# Patient Record
Sex: Male | Born: 1955 | Race: Black or African American | Hispanic: No | Marital: Married | Smoking: Never smoker
Health system: Southern US, Community
[De-identification: ages and names within clinical notes are randomized; demographics above are authoritative.]

## PROBLEM LIST (undated history)

## (undated) DIAGNOSIS — T884XXA Failed or difficult intubation, initial encounter: Secondary | ICD-10-CM

## (undated) DIAGNOSIS — E785 Hyperlipidemia, unspecified: Secondary | ICD-10-CM

## (undated) DIAGNOSIS — I2699 Other pulmonary embolism without acute cor pulmonale: Secondary | ICD-10-CM

## (undated) DIAGNOSIS — I6783 Posterior reversible encephalopathy syndrome: Secondary | ICD-10-CM

## (undated) DIAGNOSIS — E119 Type 2 diabetes mellitus without complications: Secondary | ICD-10-CM

## (undated) DIAGNOSIS — N289 Disorder of kidney and ureter, unspecified: Secondary | ICD-10-CM

## (undated) HISTORY — DX: Other pulmonary embolism without acute cor pulmonale: I26.99

## (undated) HISTORY — DX: Posterior reversible encephalopathy syndrome: I67.83

## (undated) HISTORY — PX: PROSTATECTOMY: SHX69

---

## 2007-01-31 DIAGNOSIS — E119 Type 2 diabetes mellitus without complications: Secondary | ICD-10-CM

## 2007-01-31 HISTORY — DX: Type 2 diabetes mellitus without complications: E11.9

## 2012-08-30 DIAGNOSIS — I6783 Posterior reversible encephalopathy syndrome: Secondary | ICD-10-CM | POA: Insufficient documentation

## 2012-09-05 ENCOUNTER — Emergency Department (HOSPITAL_BASED_OUTPATIENT_CLINIC_OR_DEPARTMENT_OTHER): Payer: BC Managed Care – PPO

## 2012-09-05 ENCOUNTER — Encounter (HOSPITAL_BASED_OUTPATIENT_CLINIC_OR_DEPARTMENT_OTHER): Payer: Self-pay | Admitting: *Deleted

## 2012-09-05 ENCOUNTER — Observation Stay (HOSPITAL_BASED_OUTPATIENT_CLINIC_OR_DEPARTMENT_OTHER)
Admission: EM | Admit: 2012-09-05 | Discharge: 2012-09-07 | Disposition: A | Discharge status: Home / Self Care | Payer: BC Managed Care – PPO | Attending: Internal Medicine | Admitting: Internal Medicine

## 2012-09-05 DIAGNOSIS — R9409 Abnormal results of other function studies of central nervous system: Secondary | ICD-10-CM | POA: Insufficient documentation

## 2012-09-05 DIAGNOSIS — R55 Syncope and collapse: Secondary | ICD-10-CM

## 2012-09-05 DIAGNOSIS — I10 Essential (primary) hypertension: Secondary | ICD-10-CM | POA: Insufficient documentation

## 2012-09-05 DIAGNOSIS — E119 Type 2 diabetes mellitus without complications: Secondary | ICD-10-CM | POA: Insufficient documentation

## 2012-09-05 DIAGNOSIS — E785 Hyperlipidemia, unspecified: Secondary | ICD-10-CM | POA: Insufficient documentation

## 2012-09-05 DIAGNOSIS — R9089 Other abnormal findings on diagnostic imaging of central nervous system: Secondary | ICD-10-CM | POA: Diagnosis present

## 2012-09-05 DIAGNOSIS — E1129 Type 2 diabetes mellitus with other diabetic kidney complication: Secondary | ICD-10-CM | POA: Diagnosis present

## 2012-09-05 DIAGNOSIS — R569 Unspecified convulsions: Principal | ICD-10-CM | POA: Insufficient documentation

## 2012-09-05 HISTORY — DX: Type 2 diabetes mellitus without complications: E11.9

## 2012-09-05 HISTORY — DX: Hyperlipidemia, unspecified: E78.5

## 2012-09-05 HISTORY — DX: Disorder of kidney and ureter, unspecified: N28.9

## 2012-09-05 LAB — COMPREHENSIVE METABOLIC PANEL
ALT: 42 U/L (ref 0–53)
AST: 34 U/L (ref 0–37)
CO2: 11 mEq/L — ABNORMAL LOW (ref 19–32)
Calcium: 9.7 mg/dL (ref 8.4–10.5)
Chloride: 97 mEq/L (ref 96–112)
GFR calc non Af Amer: 73 mL/min — ABNORMAL LOW (ref >90)
Potassium: 4.6 mEq/L (ref 3.5–5.1)
Sodium: 136 mEq/L (ref 135–145)
Total Bilirubin: 0.2 mg/dL — ABNORMAL LOW (ref 0.3–1.2)

## 2012-09-05 LAB — RAPID URINE DRUG SCREEN, HOSP PERFORMED
Amphetamines: NOT DETECTED
Opiates: NOT DETECTED
Tetrahydrocannabinol: NOT DETECTED

## 2012-09-05 LAB — URINALYSIS, ROUTINE W REFLEX MICROSCOPIC
Glucose, UA: >1000 mg/dL — AB
Urobilinogen, UA: 0.2 mg/dL (ref 0.0–1.0)

## 2012-09-05 LAB — URINE MICROSCOPIC-ADD ON

## 2012-09-05 LAB — CBC
Platelets: 221 10*3/uL (ref 150–400)
RBC: 5.08 MIL/uL (ref 4.22–5.81)
WBC: 20.4 10*3/uL — ABNORMAL HIGH (ref 4.0–10.5)

## 2012-09-05 MED ORDER — SODIUM CHLORIDE 0.9 % IV BOLUS (SEPSIS)
1000.0000 mL | Freq: Once | INTRAVENOUS | Status: AC
  Administered 2012-09-05: 1000 mL via INTRAVENOUS

## 2012-09-05 MED ORDER — SODIUM CHLORIDE 0.9 % IV SOLN
INTRAVENOUS | Status: AC
  Administered 2012-09-06: 04:00:00 via INTRAVENOUS

## 2012-09-05 NOTE — ED Notes (Signed)
Pt found on floor by co-workers. 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 Pt exited the clean room and co-workers told EMS that he appeared dizzy. A few minutes later co-workers found him unresponsive on the floor with a bloody nose. EMS reports responsive on arrival but appeared postictal.

## 2012-09-05 NOTE — ED Provider Notes (Signed)
CSN: [id number]     Arrival date & time 09/05/12  2103 History     First MD Initiated Contact with Patient 09/05/12 2124     Chief Complaint  Patient presents with  . Loss of Consciousness   (Consider location/radiation/quality/duration/timing/severity/associated sxs/prior Treatment) HPI Pt found down at work. Unwitnessed fall. Pt is amnestic to the event. Pt is not complaining of HA, visual changes, numbness or focal weakness. No neck pain, CP, SOB, abd pain, N/V/D. Pt with no history of seizure.  Past Medical History  Diagnosis Date  . Diabetes mellitus without complication   . Renal disorder   . Hyperlipidemia    History reviewed. No pertinent past surgical history. No family history on file. History  Substance Use Topics  . Smoking status: Never Smoker   . Smokeless tobacco: Not on file  . Alcohol Use: No    Review of Systems  Constitutional: Negative for fever and chills.  HENT: Positive for facial swelling. Negative for neck pain.   Respiratory: Negative for cough and shortness of breath.   Cardiovascular: Negative for chest pain.  Gastrointestinal: Negative for nausea, vomiting and abdominal pain.  Musculoskeletal: Negative for myalgias and back pain.  Skin: Positive for wound. Negative for rash.  Neurological: Positive for syncope. Negative for dizziness, weakness, numbness and headaches.  All other systems reviewed and are negative.    Allergies  Review of patient's allergies indicates no known allergies.  Home Medications   Current Outpatient Rx  Name  Route  Sig  Dispense  Refill  . lisinopril (PRINIVIL,ZESTRIL) 10 MG tablet   Oral   Take by mouth daily. Pt unable to remember dose         . rosuvastatin (CRESTOR) 10 MG tablet   Oral   Take 10 mg by mouth daily.         . sitaGLIPtin (JANUVIA) 25 MG tablet   Oral   Take by mouth daily. Pt unable to remember dose          BP 139/80  Pulse 115  Temp(Src) 98.3 F (36.8 C) (Oral)  Resp 24   SpO2 96% Physical Exam  Nursing note and vitals reviewed. Constitutional: He is oriented to person, place, and time. He appears well-developed and well-nourished. No distress.  HENT:  Head: Normocephalic.  Mouth/Throat: Oropharynx is clear and moist.  Nasal swelling with dried blood in both nares  Eyes: EOM are normal. Pupils are equal, round, and reactive to light.  Neck: Normal range of motion. Neck supple.  No posterior cervical TTP  Cardiovascular: Regular rhythm.   tachycardia  Pulmonary/Chest: Effort normal and breath sounds normal. No respiratory distress. He has no wheezes. He has no rales.  Abdominal: Soft. Bowel sounds are normal. He exhibits no distension and no mass. There is no tenderness. There is no rebound and no guarding.  Musculoskeletal: Normal range of motion. He exhibits no edema and no tenderness.  Neurological: He is alert and oriented to person, place, and time.  Pt following commands and answers questions though hesitant. Pt is amnestic to events earlier in the day. 5/5 motor in all ext, sensation intact throughout. Bl finger to nose intact, speech clear, normal visual fields, CN II-XII intact.    Skin: Skin is warm and dry. No rash noted. No erythema.  Psychiatric: He has a normal mood and affect. His behavior is normal.    ED Course   Procedures (including critical care time)  Labs Reviewed  CBC - Abnormal; Notable for  the following:    WBC 20.4 (*)    All other components within normal limits  COMPREHENSIVE METABOLIC PANEL - Abnormal; Notable for the following:    CO2 11 (*)    Glucose, Bld 317 (*)    Total Bilirubin 0.2 (*)    GFR calc non Af Amer 73 (*)    GFR calc Af Amer 84 (*)    All other components within normal limits  URINALYSIS, ROUTINE W REFLEX MICROSCOPIC - Abnormal; Notable for the following:    Glucose, UA >1000 (*)    Hgb urine dipstick TRACE (*)    Ketones, ur 15 (*)    Protein, ur 100 (*)    All other components within normal  limits  TROPONIN I  URINE RAPID DRUG SCREEN (HOSP PERFORMED)  CK  URINE MICROSCOPIC-ADD ON   Ct Head Wo Contrast  09/05/2012   *RADIOLOGY REPORT*  Clinical Data: Dizziness.  Syncope.  CT HEAD WITHOUT CONTRAST  Technique:  Contiguous axial images were obtained from the base of the skull through the vertex without contrast.  Comparison: No priors.  Findings: In the right occipital region there is an area of slightly ill-defined low attenuation region in the cortex and subcortical white matter on images 12-14, suspicious for acute/subacute ischemia.  No evidence of acute post-traumatic intracranial hemorrhage.  No focal mass, mass effect, hydrocephalus or abnormal intra or extra-axial fluid collections.  No acute displaced skull fractures are identified.  Mastoids are well pneumatized bilaterally.  Visualized paranasal sinuses are generally well pneumatized, with exception of multifocal mucosal thickening throughout the posterior right maxillary and bilateral sphenoid sinuses.  IMPRESSION: 1.  Area of potential acute/subacute ischemia in the right occipital lobe.  Further evaluation with brain MRI with and without gadolinium is recommended to better characterize this finding. 2.  No signs of significant acute traumatic injury to the head or brain. 3.  Paranasal sinus disease, as above.  These results were called by telephone on 09/05/2012 at 10:04 p.m. to Dr. Ranae Palms, who verbally acknowledged these results.   Original Report Authenticated By: Trudie Reed, M.D.   Dg Chest Port 1 View  09/05/2012   *RADIOLOGY REPORT*  Clinical Data: Syncope and dizziness.  PORTABLE CHEST - 1 VIEW  Comparison: No priors.  Findings: Film is severely under penetrated.  With these limitations in mind lung volumes are normal.  No consolidative airspace disease.  No pleural effusions.  No pneumothorax.  No pulmonary nodule or mass noted.  Pulmonary vasculature and the cardiomediastinal silhouette are within normal limits.   IMPRESSION: 1. No radiographic evidence of acute cardiopulmonary disease.   Original Report Authenticated By: Trudie Reed, M.D.   1. Seizure     Date: 09/05/2012  Rate: 114  Rhythm: sinus tachycardia  QRS Axis: normal  Intervals: normal  ST/T Wave abnormalities: normal  Conduction Disutrbances:none  Narrative Interpretation:   Old EKG Reviewed: none available   MDM  Discussed with Dr Roseanne Reno at 2210. Stated given probable seizure and no current deficits pt is not tPA candidate. Will see when admitted.  Pt is now more alert and immediately responsive. Continues to have no deficits. Agrees to be transferred to [facility].   Discussed with Dr Conley Rolls who accepted pt in transfer to tele floor. Pt remains neurologically stable.   Loren Racer, MD 09/05/12 2329

## 2012-09-05 NOTE — ED Notes (Signed)
Called Care link for Neuro per Dr. Ranae Palms, spoke with Bjorn Loser

## 2012-09-05 NOTE — Progress Notes (Signed)
PENDING ACCEPTANCE TRANFER NOTE:  Call received from:  Dr Ranae Palms of [facility].  REASON FOR REQUESTING TRANSFER: CVA and seizure work up.  HPI: Patient was found with possible Sz, ?post ictal.  CT showed has some density which could be a cva.  Neuro consulted, recommended CVA and Sz work up.   PLAN:  According to telephone report, this patient was accepted for transfer to Telemetry [facility].  I have requested an order be written to call Flow Manager at [phone] upon patient arrival to the floor for final physician assignment who will do the admission and give admitting orders.  SIGNEDHouston Siren, MD [facility]  09/05/2012, 11:27 PM

## 2012-09-06 ENCOUNTER — Inpatient Hospital Stay (HOSPITAL_COMMUNITY)
Admit: 2012-09-06 | Discharge: 2012-09-06 | Disposition: A | Discharge status: Home / Self Care | Payer: BC Managed Care – PPO | Attending: Internal Medicine | Admitting: Internal Medicine

## 2012-09-06 ENCOUNTER — Observation Stay (HOSPITAL_COMMUNITY): Payer: BC Managed Care – PPO

## 2012-09-06 DIAGNOSIS — R9089 Other abnormal findings on diagnostic imaging of central nervous system: Secondary | ICD-10-CM | POA: Diagnosis present

## 2012-09-06 DIAGNOSIS — R55 Syncope and collapse: Secondary | ICD-10-CM

## 2012-09-06 DIAGNOSIS — R930 Abnormal findings on diagnostic imaging of skull and head, not elsewhere classified: Secondary | ICD-10-CM

## 2012-09-06 DIAGNOSIS — R569 Unspecified convulsions: Secondary | ICD-10-CM | POA: Diagnosis present

## 2012-09-06 DIAGNOSIS — E119 Type 2 diabetes mellitus without complications: Secondary | ICD-10-CM

## 2012-09-06 DIAGNOSIS — I10 Essential (primary) hypertension: Secondary | ICD-10-CM | POA: Diagnosis present

## 2012-09-06 DIAGNOSIS — E1129 Type 2 diabetes mellitus with other diabetic kidney complication: Secondary | ICD-10-CM | POA: Diagnosis present

## 2012-09-06 LAB — HEMOGLOBIN A1C: Hgb A1c MFr Bld: 7.1 % — ABNORMAL HIGH (ref <5.7)

## 2012-09-06 LAB — GLUCOSE, CAPILLARY
Glucose-Capillary: 151 mg/dL — ABNORMAL HIGH (ref 70–99)
Glucose-Capillary: 113 mg/dL — ABNORMAL HIGH (ref 70–99)
Glucose-Capillary: 212 mg/dL — ABNORMAL HIGH (ref 70–99)

## 2012-09-06 LAB — BASIC METABOLIC PANEL
CO2: 19 mEq/L (ref 19–32)
Calcium: 9.2 mg/dL (ref 8.4–10.5)
Potassium: 4.3 mEq/L (ref 3.5–5.1)
Sodium: 139 mEq/L (ref 135–145)

## 2012-09-06 LAB — TROPONIN I: Troponin I: <0.3 ng/mL (ref <0.30)

## 2012-09-06 LAB — CBC WITH DIFFERENTIAL/PLATELET
Basophils Absolute: 0 10*3/uL (ref 0.0–0.1)
Eosinophils Relative: 0 % (ref 0–5)
Lymphocytes Relative: 21 % (ref 12–46)
Neutro Abs: 7 10*3/uL (ref 1.7–7.7)
Neutrophils Relative %: 67 % (ref 43–77)
Platelets: 175 10*3/uL (ref 150–400)
RDW: 12.8 % (ref 11.5–15.5)
WBC: 10.3 10*3/uL (ref 4.0–10.5)

## 2012-09-06 MED ORDER — GADOBENATE DIMEGLUMINE 529 MG/ML IV SOLN
20.0000 mL | Freq: Once | INTRAVENOUS | Status: AC
  Administered 2012-09-06: 20 mL via INTRAVENOUS

## 2012-09-06 MED ORDER — INSULIN ASPART 100 UNIT/ML ~~LOC~~ SOLN
0.0000 [IU] | Freq: Three times a day (TID) | SUBCUTANEOUS | Status: DC
  Administered 2012-09-06: 2 [IU] via SUBCUTANEOUS
  Administered 2012-09-06: 1 [IU] via SUBCUTANEOUS
  Administered 2012-09-07 (×3): 2 [IU] via SUBCUTANEOUS

## 2012-09-06 MED ORDER — ASPIRIN EC 325 MG PO TBEC
325.0000 mg | DELAYED_RELEASE_TABLET | Freq: Every day | ORAL | Status: DC
  Administered 2012-09-06 – 2012-09-07 (×2): 325 mg via ORAL
  Filled 2012-09-06 (×2): qty 1

## 2012-09-06 MED ORDER — LISINOPRIL 10 MG PO TABS
10.0000 mg | ORAL_TABLET | Freq: Every day | ORAL | Status: DC
  Administered 2012-09-06 – 2012-09-07 (×2): 10 mg via ORAL
  Filled 2012-09-06 (×2): qty 1

## 2012-09-06 MED ORDER — INSULIN ASPART 100 UNIT/ML ~~LOC~~ SOLN: 0.0000 [IU] | SUBCUTANEOUS | Status: DC

## 2012-09-06 MED ORDER — ENOXAPARIN SODIUM 40 MG/0.4ML ~~LOC~~ SOLN
40.0000 mg | SUBCUTANEOUS | Status: DC
  Administered 2012-09-06 – 2012-09-07 (×2): 40 mg via SUBCUTANEOUS
  Filled 2012-09-06 (×2): qty 0.4

## 2012-09-06 MED ORDER — LINAGLIPTIN 5 MG PO TABS
5.0000 mg | ORAL_TABLET | Freq: Every day | ORAL | Status: DC
  Administered 2012-09-06 – 2012-09-07 (×2): 5 mg via ORAL
  Filled 2012-09-06 (×2): qty 1

## 2012-09-06 MED ORDER — SODIUM CHLORIDE 0.9 % IJ SOLN
3.0000 mL | Freq: Two times a day (BID) | INTRAMUSCULAR | Status: DC
  Administered 2012-09-06 – 2012-09-07 (×4): 3 mL via INTRAVENOUS

## 2012-09-06 MED ORDER — ATORVASTATIN CALCIUM 20 MG PO TABS
20.0000 mg | ORAL_TABLET | Freq: Every day | ORAL | Status: DC
  Administered 2012-09-06 – 2012-09-07 (×2): 20 mg via ORAL
  Filled 2012-09-06 (×2): qty 1

## 2012-09-06 NOTE — Evaluation (Signed)
Physical Therapy Evaluation Patient Details Name: Juan Glenn MRN: [id number] DOB: 12-Aug-1955 Today's Date: 09/06/2012 Time: 4098-1191 PT Time Calculation (min): 11 min  PT Assessment / Plan / Recommendation History of Present Illness  Patient is a 57 yo male admitted with seizures and abnormal head CT.  Area of potential acute/subacute ischemia in the right occipital area per CT report.  Clinical Impression  Patient is independent with all mobility and gait.  Patient scored 24/24 on DGI balance assessment indicating no fall risk.  No acute PT needs identified - PT will sign off.    PT Assessment  Patent does not need any further PT services    Follow Up Recommendations  No PT follow up    Does the patient have the potential to tolerate intense rehabilitation      Barriers to Discharge        Equipment Recommendations  None recommended by PT    Recommendations for Other Services     Frequency      Precautions / Restrictions Precautions Precautions: None Restrictions Weight Bearing Restrictions: No   Pertinent Vitals/Pain       Mobility  Bed Mobility Bed Mobility: Supine to Sit;Sit to Supine Supine to Sit: 7: Independent Sit to Supine: 7: Independent Transfers Transfers: Sit to Stand;Stand to Sit Sit to Stand: 7: Independent;From bed Stand to Sit: 7: Independent;To bed Ambulation/Gait Ambulation/Gait Assistance: 7: Independent Ambulation Distance (Feet): 220 Feet Assistive device: None Ambulation/Gait Assistance Details: Patient with good gait pattern, balance, and gait speed.  No loss in balance noted with high level balance challenges. Gait Pattern: Within Functional Limits Gait velocity: WFL Stairs: Yes Stairs Assistance: 7: Independent Stair Management Technique: No rails;Alternating pattern;Forwards Number of Stairs: 5 Modified Rankin (Stroke Patients Only) Pre-Morbid Rankin Score: No symptoms Modified Rankin: No symptoms        PT  Goals(Current goals can be found in the care plan section)  N/A  Visit Information  Last PT Received On: 09/06/12 Assistance Needed: +1 History of Present Illness: Patient is a 57 yo male admitted with seizures and abnormal head CT.  Area of potential acute/subacute ischemia in the right occipital area per CT report.       Prior Functioning  Home Living Family/patient expects to be discharged to:: Private residence Living Arrangements: Spouse/significant other Available Help at Discharge: Family;Available 24 hours/day Type of Home: House Home Access: Stairs to enter [employer] of Steps: 3 Entrance Stairs-Rails: Right Home Layout: One level Home Equipment: None Prior Function Level of Independence: Independent Communication Communication: No difficulties Dominant Hand: Right    Cognition  Cognition Arousal/Alertness: Awake/alert Behavior During Therapy: WFL for tasks assessed/performed Overall Cognitive Status: Within Functional Limits for tasks assessed    Extremity/Trunk Assessment Upper Extremity Assessment Upper Extremity Assessment: Overall WFL for tasks assessed Lower Extremity Assessment Lower Extremity Assessment: Overall WFL for tasks assessed (Strength symmetrical with no sensory loss noted.) Cervical / Trunk Assessment Cervical / Trunk Assessment: Normal   Balance Balance Balance Assessed: Yes Standardized Balance Assessment Standardized Balance Assessment: Dynamic Gait Index Dynamic Gait Index Level Surface: Normal Change in Gait Speed: Normal Gait with Horizontal Head Turns: Normal Gait with Vertical Head Turns: Normal Gait and Pivot Turn: Normal Step Over Obstacle: Normal Step Around Obstacles: Normal Steps: Normal Total Score: 24  End of Session PT - End of Session Activity Tolerance: Patient tolerated treatment well Patient left: in bed;with family/visitor present Nurse Communication: Mobility status  GP Functional Assessment Tool  Used: DGI balance assessment and  clinical judgement Functional Limitation: Mobility: Walking and moving around Mobility: Walking and Moving Around Current Status [phone]): 0 percent impaired, limited or restricted Mobility: Walking and Moving Around Goal Status [phone]): 0 percent impaired, limited or restricted Mobility: Walking and Moving Around Discharge Status [phone]): 0 percent impaired, limited or restricted   Vena Austria 09/06/2012, 2:33 PM Durenda Hurt. Renaldo Fiddler, [facility] Pager [phone]

## 2012-09-06 NOTE — Progress Notes (Signed)
[facility] PROGRESS NOTE  Juan Glenn VHQ:[id number] DOB: 1955/06/21 DOA: 09/05/2012 PCP: No primary provider on file.  Brief history 57 year old male with a history of hypertension, diabetes mellitus, hyperlipidemia was found unconscious in the bathroom of his workplace. The patient stated that earlier that evening at work he had an episode of transient blurred vision with associated nausea. As result he went to the bathroom because he felt like he wanted to vomit. There was a less than he remembered. The patient was found by a coworker and EMS was activated. The patient apparently was confused when he woke up. The patient denied any recent fevers, chills, chest discomfort, shortness of breath, nausea, vomiting, blurred vision, focal extremity weakness, dysuria, hematuria, dysarthria this past week. He had been in his usual state of health prior to this episode. In the ED, CT revealed abnormal ill-defined low attenuation area in the right occipital lobe. The patient was also found to have an abrasion on his tongue. Assessment/Plan: Syncope -Secondary to generalized seizure -MRI brain -EEG -Further testing pending results of MRI and EEG -Appreciate neurology consultation -Continue aspirin 325 mg -repeat troponin Abnormal CT brain -Concerned about stroke as the cause for the patient's general a seizure -MRI brain as discussed Leukocytosis -Likely reactive due to the patient's acute medical condition -Continue to trend -Urinalysis did not show pyuria -Urine drug screen is negative Diabetes mellitus type 2 -NovoLog sliding scale -Check hemoglobin A1c Hyperlipidemia -Continue statin Hypertension -Continue lisinopril Family Communication:   wife at beside Disposition Plan:   Home when medically stable      Procedures/Studies: Ct Head Wo Contrast  09/05/2012   *RADIOLOGY REPORT*  Clinical Data: Dizziness.  Syncope.  CT HEAD WITHOUT CONTRAST  Technique:  Contiguous  axial images were obtained from the base of the skull through the vertex without contrast.  Comparison: No priors.  Findings: In the right occipital region there is an area of slightly ill-defined low attenuation region in the cortex and subcortical white matter on images 12-14, suspicious for acute/subacute ischemia.  No evidence of acute post-traumatic intracranial hemorrhage.  No focal mass, mass effect, hydrocephalus or abnormal intra or extra-axial fluid collections.  No acute displaced skull fractures are identified.  Mastoids are well pneumatized bilaterally.  Visualized paranasal sinuses are generally well pneumatized, with exception of multifocal mucosal thickening throughout the posterior right maxillary and bilateral sphenoid sinuses.  IMPRESSION: 1.  Area of potential acute/subacute ischemia in the right occipital lobe.  Further evaluation with brain MRI with and without gadolinium is recommended to better characterize this finding. 2.  No signs of significant acute traumatic injury to the head or brain. 3.  Paranasal sinus disease, as above.  These results were called by telephone on 09/05/2012 at 10:04 p.m. to Dr. Ranae Palms, who verbally acknowledged these results.   Original Report Authenticated By: Trudie Reed, M.D.   Dg Chest Port 1 View  09/05/2012   *RADIOLOGY REPORT*  Clinical Data: Syncope and dizziness.  PORTABLE CHEST - 1 VIEW  Comparison: No priors.  Findings: Film is severely under penetrated.  With these limitations in mind lung volumes are normal.  No consolidative airspace disease.  No pleural effusions.  No pneumothorax.  No pulmonary nodule or mass noted.  Pulmonary vasculature and the cardiomediastinal silhouette are within normal limits.  IMPRESSION: 1. No radiographic evidence of acute cardiopulmonary disease.   Original Report Authenticated By: Trudie Reed, M.D.         Subjective: Patient denies any fevers, chills, chest discomfort, shortness  of breath, nausea,  vomiting, diarrhea, vomiting, headache, focal extremity weakness, dysarthria, visual disturbance.  Objective: Filed Vitals:   09/05/12 2108 09/05/12 2344 09/06/12 0152 09/06/12 0538  BP: 139/80 137/82 134/81 124/78  Pulse: 115 97 93 79  Temp: 98.3 F (36.8 C)  98.1 F (36.7 C) 98.1 F (36.7 C)  TempSrc: Oral  Oral Oral  Resp: 24 16 18 18   Height:   5\' 9"  (1.753 m)   Weight:   97.342 kg (214 lb 9.6 oz)   SpO2: 96% 97% 96% 98%    Intake/Output Summary (Last 24 hours) at 09/06/12 0814 Last data filed at 09/06/12 0400  Gross per 24 hour  Intake    120 ml  Output      0 ml  Net    120 ml   Weight change:  Exam:   General:  Pt is alert, follows commands appropriately, not in acute distress  HEENT: No icterus, No thrush, No neck mass, [place]/AT  Cardiovascular: RRR, S1/S2, no rubs, no gallops  Respiratory: CTA bilaterally, no wheezing, no crackles, no rhonchi  Abdomen: Soft/+BS, non tender, non distended, no guarding  Extremities: No edema, No lymphangitis, No petechiae, No rashes, no synovitis  Data Reviewed: Basic Metabolic Panel:  Recent Labs Lab 09/05/12 2114  NA 136  K 4.6  CL 97  CO2 11*  GLUCOSE 317*  BUN 13  CREATININE 1.10  CALCIUM 9.7   Liver Function Tests:  Recent Labs Lab 09/05/12 2114  AST 34  ALT 42  ALKPHOS 86  BILITOT 0.2*  PROT 7.4  ALBUMIN 4.3   No results found for this basename: LIPASE, AMYLASE,  in the last 168 hours No results found for this basename: AMMONIA,  in the last 168 hours CBC:  Recent Labs Lab 09/05/12 2114  WBC 20.4*  HGB 15.4  HCT 43.8  MCV 86.2  PLT 221   Cardiac Enzymes:  Recent Labs Lab 09/05/12 2114  CKTOTAL 132  TROPONINI <0.30   BNP: No components found with this basename: POCBNP,  CBG:  Recent Labs Lab 09/06/12 0717  GLUCAP 143*    No results found for this or any previous visit (from the past 240 hour(s)).   Scheduled Meds: . sodium chloride   Intravenous STAT  . aspirin EC  325 mg  Oral Daily  . atorvastatin  20 mg Oral q1800  . insulin aspart  0-9 Units Subcutaneous TID WC  . linagliptin  5 mg Oral Daily  . lisinopril  10 mg Oral Daily  . sodium chloride  3 mL Intravenous Q12H   Continuous Infusions:    Jaymi Tinner, DO  [facility] Pager [phone]  If 7PM-7AM, please contact night-coverage [email or web address] Password [email or web address] 09/06/2012, 8:14 AM   LOS: 1 day

## 2012-09-06 NOTE — Progress Notes (Signed)
EEG Completed; Results Pending  

## 2012-09-06 NOTE — H&P (Signed)
[facility] History and Physical  Joedy Eickhoff HYQ:[id number] DOB: 11/24/1955    PCP:   Gentry Fitz.  Chief Complaint: transfer here from [facility] for seizure and abnormal head CT.  HPI: Juan Glenn is an 57 y.o. male wih hx of DM2 on oral hypoglycemic agents, Hyperlipidemia and HTN, with no prior hx of neurological problem, found unconscious in the bathroom without any prodrome.  He apparently bit his tongue and had period of confusion. He did not have any bowel or bladder incontinence.  There has been no focal weakness.  Work up in the ER included a head CT with hypodense area suggestive of acute/subacute CVA in the occipital area, CPK of 132, with normal renal fx test, and BS of 300's.  Neurology was consulted and hospitalist was asked to admit him for seizure, possibly having acute/subacute CVA.  Rewiew of Systems:  Constitutional: Negative for malaise, fever and chills. No significant weight loss or weight gain Eyes: Negative for eye pain, redness and discharge, diplopia, visual changes, or flashes of light. ENMT: Negative for ear pain, hoarseness, nasal congestion, sinus pressure and sore throat. No headaches; tinnitus, drooling, or problem swallowing. Cardiovascular: Negative for chest pain, palpitations, diaphoresis, dyspnea and peripheral edema. ; No orthopnea, PND Respiratory: Negative for cough, hemoptysis, wheezing and stridor. No pleuritic chestpain. Gastrointestinal: Negative for nausea, vomiting, diarrhea, constipation, abdominal pain, melena, blood in stool, hematemesis, jaundice and rectal bleeding.    Genitourinary: Negative for frequency, dysuria, incontinence,flank pain and hematuria; Musculoskeletal: Negative for back pain and neck pain. Negative for swelling and trauma.;  Skin: . Negative for pruritus, rash, abrasions, bruising and skin lesion.; ulcerations Neuro: Negative for headache, lightheadedness and neck stiffness. Negative for weakness, altered  level of consciousness , extremity weakness, burning feet, involuntary movement,   Psych: negative for anxiety, depression, insomnia, tearfulness, panic attacks, hallucinations, paranoia, suicidal or homicidal ideation    Past Medical History  Diagnosis Date  . Diabetes mellitus without complication   . Renal disorder   . Hyperlipidemia     History reviewed. No pertinent past surgical history.  Medications:  HOME MEDS: Prior to Admission medications   Medication Sig Start Date End Date Taking? Authorizing Provider  lisinopril (PRINIVIL,ZESTRIL) 10 MG tablet Take by mouth daily. Pt unable to remember dose   Yes Historical Provider, MD  rosuvastatin (CRESTOR) 10 MG tablet Take 10 mg by mouth daily.   Yes Historical Provider, MD  sitaGLIPtin (JANUVIA) 25 MG tablet Take by mouth daily. Pt unable to remember dose   Yes Historical Provider, MD     Allergies:  No Known Allergies  Social History:   reports that he has never smoked. He does not have any smokeless tobacco history on file. He reports that he does not drink alcohol or use illicit drugs.  Family History: No family history on file.   Physical Exam: Filed Vitals:   09/05/12 2106 09/05/12 2108 09/05/12 2344 09/06/12 0152  BP:  139/80 137/82 134/81  Pulse:  115 97 93  Temp:  98.3 F (36.8 C)  98.1 F (36.7 C)  TempSrc:  Oral  Oral  Resp:  24 16 18   Height:    5\' 9"  (1.753 m)  Weight:    97.342 kg (214 lb 9.6 oz)  SpO2: 98% 96% 97% 96%   Blood pressure 134/81, pulse 93, temperature 98.1 F (36.7 C), temperature source Oral, resp. rate 18, height 5\' 9"  (1.753 m), weight 97.342 kg (214 lb 9.6 oz), SpO2 96.00%.  GEN:  Pleasant  patient  lying in the stretcher in no acute distress; cooperative with exam. PSYCH:  alert and oriented x4; does not appear anxious or depressed; affect is appropriate. HEENT: Mucous membranes pink and anicteric; PERRLA; EOM intact; no cervical lymphadenopathy nor thyromegaly or carotid bruit;  no JVD; There were no stridor. Neck is very supple. Breasts:: Not examined CHEST WALL: No tenderness CHEST: Normal respiration, clear to auscultation bilaterally.  HEART: Regular rate and rhythm.  There are no murmur, rub, or gallops.   BACK: No kyphosis or scoliosis; no CVA tenderness ABDOMEN: soft and non-tender; no masses, no organomegaly, normal abdominal bowel sounds; no pannus; no intertriginous candida. There is no rebound and no distention. Rectal Exam: Not done EXTREMITIES: No bone or joint deformity; age-appropriate arthropathy of the hands and knees; no edema; no ulcerations.  There is no calf tenderness. Genitalia: not examined PULSES: 2+ and symmetric SKIN: Normal hydration no rash or ulceration CNS: Cranial nerves 2-12 grossly intact no focal lateralizing neurologic deficit.  Speech is fluent; uvula elevated with phonation, facial symmetry and tongue midline. DTR are normal bilaterally, cerebella exam is intact, barbinski is negative and strengths are equaled bilaterally.  No sensory loss.   Labs on Admission:  Basic Metabolic Panel:  Recent Labs Lab 09/05/12 2114  NA 136  K 4.6  CL 97  CO2 11*  GLUCOSE 317*  BUN 13  CREATININE 1.10  CALCIUM 9.7   Liver Function Tests:  Recent Labs Lab 09/05/12 2114  AST 34  ALT 42  ALKPHOS 86  BILITOT 0.2*  PROT 7.4  ALBUMIN 4.3   No results found for this basename: LIPASE, AMYLASE,  in the last 168 hours No results found for this basename: AMMONIA,  in the last 168 hours CBC:  Recent Labs Lab 09/05/12 2114  WBC 20.4*  HGB 15.4  HCT 43.8  MCV 86.2  PLT 221   Cardiac Enzymes:  Recent Labs Lab 09/05/12 2114  CKTOTAL 132  TROPONINI <0.30    CBG: No results found for this basename: GLUCAP,  in the last 168 hours   Radiological Exams on Admission: Ct Head Wo Contrast  09/05/2012   *RADIOLOGY REPORT*  Clinical Data: Dizziness.  Syncope.  CT HEAD WITHOUT CONTRAST  Technique:  Contiguous axial images were  obtained from the base of the skull through the vertex without contrast.  Comparison: No priors.  Findings: In the right occipital region there is an area of slightly ill-defined low attenuation region in the cortex and subcortical white matter on images 12-14, suspicious for acute/subacute ischemia.  No evidence of acute post-traumatic intracranial hemorrhage.  No focal mass, mass effect, hydrocephalus or abnormal intra or extra-axial fluid collections.  No acute displaced skull fractures are identified.  Mastoids are well pneumatized bilaterally.  Visualized paranasal sinuses are generally well pneumatized, with exception of multifocal mucosal thickening throughout the posterior right maxillary and bilateral sphenoid sinuses.  IMPRESSION: 1.  Area of potential acute/subacute ischemia in the right occipital lobe.  Further evaluation with brain MRI with and without gadolinium is recommended to better characterize this finding. 2.  No signs of significant acute traumatic injury to the head or brain. 3.  Paranasal sinus disease, as above.  These results were called by telephone on 09/05/2012 at 10:04 p.m. to Dr. Ranae Palms, who verbally acknowledged these results.   Original Report Authenticated By: Trudie Reed, M.D.   Dg Chest Port 1 View  09/05/2012   *RADIOLOGY REPORT*  Clinical Data: Syncope and dizziness.  PORTABLE CHEST - 1 VIEW  Comparison: No priors.  Findings: Film is severely under penetrated.  With these limitations in mind lung volumes are normal.  No consolidative airspace disease.  No pleural effusions.  No pneumothorax.  No pulmonary nodule or mass noted.  Pulmonary vasculature and the cardiomediastinal silhouette are within normal limits.  IMPRESSION: 1. No radiographic evidence of acute cardiopulmonary disease.   Original Report Authenticated By: Trudie Reed, M.D.   Assessment/Plan Present on Admission:  . Unspecified essential hypertension . Type II or unspecified type diabetes mellitus  without mention of complication, not stated as uncontrolled . Convulsions/seizures . Abnormal CT of brain  PLAN:  Suspect a subacute CVA with first generalized TC seizure.  Will get EEG along with follow up MRI for his abnormal brain CT.  For his DM, will continue his meds and use sensitive SSI.  I have continued his home meds.  He is not to operate dangerous machinery or driving for the next 6 months unless cleared by neurology.  He is stable, full code, and will be admitted to [facility] service.  Thank you for allowing me to participate in the care of this nice patient.  Other plans as per orders.  Code Status: FULL Unk Lightning, MD. [facility] Pager [phone] 7pm to 7am.  09/06/2012, 3:16 AM

## 2012-09-06 NOTE — Consult Note (Signed)
Reason for Consult: Generalized seizure, first occurrence; possible acute stroke.  HPI:                                                                                                                                          Juan Glenn is an 57 y.o. male Mr. diabetes mellitus, hypertension and hyperlipidemia who was seen in the emergency room at [facility] and subsequently transferred to [facility] for further management. Patient was found in the bathroom unresponsive at work. He was noted to have an abrasion of his tongue in addition to being confused on arrival in the emergency room. No previous history of stroke nor TIA. Is also no previous history of seizure or syncopal spells. CT scan of his head showed an equivocal area of possible acute ischemia involving the right occipital region. Patient said no visual changes. Blood sugar was 317. Electrolytes were normal.  Past Medical History  Diagnosis Date  . Diabetes mellitus without complication   . Renal disorder   . Hyperlipidemia     History reviewed. No pertinent past surgical history.  No family history on file.  Social History:  reports that he has never smoked. He does not have any smokeless tobacco history on file. He reports that he does not drink alcohol or use illicit drugs.  No Known Allergies  MEDICATIONS:                                                                                                                     I have reviewed the patient's current medications.   ROS:                                                                                                                                       History obtained from the patient  General  ROS: negative for - chills, fatigue, fever, night sweats, weight gain or weight loss Psychological ROS: negative for - behavioral disorder, hallucinations, memory difficulties, mood swings or suicidal ideation Ophthalmic ROS: negative for - blurry vision, double vision, eye pain  or loss of vision ENT ROS: negative for - epistaxis, nasal discharge, oral lesions, sore throat, tinnitus or vertigo Allergy and Immunology ROS: negative for - hives or itchy/watery eyes Hematological and Lymphatic ROS: negative for - bleeding problems, bruising or swollen lymph nodes Endocrine ROS: negative for - galactorrhea, hair pattern changes, polydipsia/polyuria or temperature intolerance Respiratory ROS: negative for - cough, hemoptysis, shortness of breath or wheezing Cardiovascular ROS: negative for - chest pain, dyspnea on exertion, edema or irregular heartbeat Gastrointestinal ROS: negative for - abdominal pain, diarrhea, hematemesis, nausea/vomiting or stool incontinence Genito-Urinary ROS: negative for - dysuria, hematuria, incontinence or urinary frequency/urgency Musculoskeletal ROS: negative for - joint swelling or muscular weakness Neurological ROS: as noted in HPI Dermatological ROS: negative for rash and skin lesion changes   Blood pressure 134/81, pulse 93, temperature 98.1 F (36.7 C), temperature source Oral, resp. rate 18, height 5\' 9"  (1.753 m), weight 97.342 kg (214 lb 9.6 oz), SpO2 96.00%.   Neurologic Examination:                                                                                                      Mental Status: Alert, oriented, thought content appropriate.  Speech fluent without evidence of aphasia. Able to follow commands without difficulty. Cranial Nerves: II-Visual fields were normal. III/IV/VI-Pupils were equal and reacted. Extraocular movements were full and conjugate.    V/VII-no facial numbness and no facial weakness. VIII-normal. X-normal speech and symmetrical palatal movement. XII-midline tongue extension; abrasions with ecchymosis noted at the tip of his tongue. Motor: 5/5 bilaterally with normal tone and bulk Sensory: Normal throughout. Deep Tendon Reflexes: 2+ and symmetric. Plantars: Mute bilaterally Cerebellar: Normal  finger-to-nose testing. Carotid auscultation: Normal    Ct Head Wo Contrast  09/05/2012   *RADIOLOGY REPORT*  Clinical Data: Dizziness.  Syncope.  CT HEAD WITHOUT CONTRAST  Technique:  Contiguous axial images were obtained from the base of the skull through the vertex without contrast.  Comparison: No priors.  Findings: In the right occipital region there is an area of slightly ill-defined low attenuation region in the cortex and subcortical white matter on images 12-14, suspicious for acute/subacute ischemia.  No evidence of acute post-traumatic intracranial hemorrhage.  No focal mass, mass effect, hydrocephalus or abnormal intra or extra-axial fluid collections.  No acute displaced skull fractures are identified.  Mastoids are well pneumatized bilaterally.  Visualized paranasal sinuses are generally well pneumatized, with exception of multifocal mucosal thickening throughout the posterior right maxillary and bilateral sphenoid sinuses.  IMPRESSION: 1.  Area of potential acute/subacute ischemia in the right occipital lobe.  Further evaluation with brain MRI with and without gadolinium is recommended to better characterize this finding. 2.  No signs of significant acute traumatic injury to the head or brain. 3.  Paranasal sinus disease, as above.  These results were called by  telephone on 09/05/2012 at 10:04 p.m. to Dr. Ranae Palms, who verbally acknowledged these results.   Original Report Authenticated By: Trudie Reed, M.D.   Dg Chest Port 1 View  09/05/2012   *RADIOLOGY REPORT*  Clinical Data: Syncope and dizziness.  PORTABLE CHEST - 1 VIEW  Comparison: No priors.  Findings: Film is severely under penetrated.  With these limitations in mind lung volumes are normal.  No consolidative airspace disease.  No pleural effusions.  No pneumothorax.  No pulmonary nodule or mass noted.  Pulmonary vasculature and the cardiomediastinal silhouette are within normal limits.  IMPRESSION: 1. No radiographic evidence of  acute cardiopulmonary disease.   Original Report Authenticated By: Trudie Reed, M.D.   Assessment/Plan: Generalized seizure, first occurrence. Etiology is unclear. Acute stroke we'll need to be ruled out.  Recommendations: 1. MRI of the brain without contrast rule out acute stroke 2. Stroke risk assessment if MRI shows acute stroke 3. EEG routine adult 4. No anticonvulsant medication unless patient has a recurrent generalized seizure, or EEG shows findings indicative of seizure disorder and increased risk for future seizure activity 5. Aspirin 81 mg per day  C.R. Roseanne Reno, MD [facility] [phone]  09/06/2012, 2:21 AM

## 2012-09-06 NOTE — Evaluation (Signed)
Occupational Therapy Evaluation Patient Details Name: Juan Glenn MRN: [id number] DOB: [birth year] Today's Date: 09/06/2012 Time: 4098-1191 OT Time Calculation (min): 15 min  OT Assessment / Plan / Recommendation History of present illness Patient is a 57 yo male admitted with seizures and abnormal head CT.  Area of potential acute/subacute ischemia in the right occipital area per CT report.   Clinical Impression   Patient evaluated by Occupational Therapy with no further acute OT needs identified. All education has been completed and the patient has no further questions. See below for any follow-up Occupational Therapy or equipment needs. OT to sign off. Thank you for referral.      OT Assessment       Follow Up Recommendations  No OT follow up    Barriers to Discharge      Equipment Recommendations  None recommended by OT    Recommendations for Other Services    Frequency       Precautions / Restrictions Precautions Precautions: None Restrictions Weight Bearing Restrictions: No   Pertinent Vitals/Pain No pain Pt and family very anxious to know CT results and awaiting an MRI for more detailed workup    ADL  ADL Comments: Pt is at or near baseline for all adls at independent level. Pt with small visual change noted. Pt has not been educated nor family present on CT scan results. OT was unable to provide detailed explaination for visual changes at this time.  MD please address CT results    OT Diagnosis:    OT Problem List:   OT Treatment Interventions:     OT Goals(Current goals can be found in the care plan section)    Visit Information  Last OT Received On: 09/06/12 Assistance Needed: +1 History of Present Illness: Patient is a 57 yo male admitted with seizures and abnormal head CT.  Area of potential acute/subacute ischemia in the right occipital area per CT report.       Prior Functioning     Home Living Family/patient expects to be discharged  to:: Private residence Living Arrangements: Spouse/significant other Available Help at Discharge: Family;Available 24 hours/day Type of Home: House Home Access: Stairs to enter [employer] of Steps: 3 Entrance Stairs-Rails: Right Home Layout: One level Home Equipment: None Prior Function Level of Independence: Independent Communication Communication: No difficulties Dominant Hand: Right         Vision/Perception Vision - History Baseline Vision: Wears glasses all the time Patient Visual Report:  (changes in left superior visual field) Vision - Assessment Eye Alignment: Within Functional Limits Vision Assessment: Vision tested Ocular Range of Motion: Within Functional Limits Tracking/Visual Pursuits: Able to track stimulus in all quads without difficulty Saccades: Within functional limits Visual Fields:  (left superior visual field) Additional Comments: Pt provided visual cancellation with small errors in top left superior corner. Pt requires scanning for job and encouraged to follow up with MD upon d/c home.    Cognition  Cognition Arousal/Alertness: Awake/alert Behavior During Therapy: WFL for tasks assessed/performed Overall Cognitive Status: Within Functional Limits for tasks assessed    Extremity/Trunk Assessment Upper Extremity Assessment Upper Extremity Assessment: Overall WFL for tasks assessed Lower Extremity Assessment Lower Extremity Assessment: Overall WFL for tasks assessed Cervical / Trunk Assessment Cervical / Trunk Assessment: Normal     Mobility Bed Mobility Bed Mobility: Supine to Sit;Sit to Supine Supine to Sit: 7: Independent Sit to Supine: 7: Independent Transfers Sit to Stand: 7: Independent;From bed Stand to Sit: 7: Independent;To bed  Exercise     Balance Balance Balance Assessed: Yes Standardized Balance Assessment Standardized Balance Assessment: Dynamic Gait Index Dynamic Gait Index Level Surface: Normal Change in  Gait Speed: Normal Gait with Horizontal Head Turns: Normal Gait with Vertical Head Turns: Normal Gait and Pivot Turn: Normal Step Over Obstacle: Normal Step Around Obstacles: Normal Steps: Normal Total Score: 24   End of Session    GO Functional Assessment Tool Used: clinical observation Functional Limitation: Self care Self Care Current Status (Z6109): 0 percent impaired, limited or restricted Self Care Goal Status (U0454): 0 percent impaired, limited or restricted Self Care Discharge Status (U9811): 0 percent impaired, limited or restricted   Lucile Shutters 09/06/2012, 3:31 PM Pager: [phone]

## 2012-09-06 NOTE — Procedures (Signed)
ELECTROENCEPHALOGRAM REPORT   Patient: Juan Glenn       Room #: 6E95 EEG No. ID: [id number] Age: 57 y.o.        Sex: male Referring Physician: Tat Report Date:  09/06/2012        Interpreting Physician: Thana Farr D  History: [name] is an 57 y.o. male with an episode of unresponsiveness  Medications:  Scheduled: . aspirin EC  325 mg Oral Daily  . atorvastatin  20 mg Oral q1800  . enoxaparin (LOVENOX) injection  40 mg Subcutaneous Q24H  . insulin aspart  0-9 Units Subcutaneous TID WC  . linagliptin  5 mg Oral Daily  . lisinopril  10 mg Oral Daily  . sodium chloride  3 mL Intravenous Q12H    Conditions of Recording:  This is a 16 channel EEG carried out with the patient in the awake, drowsy and asleep states.  Description:  The waking background activity consists of a low voltage, symmetrical, fairly well organized, 9 Hz alpha activity, seen from the parieto-occipital and posterior temporal regions.  Low voltage fast activity, poorly organized, is seen anteriorly and is at times superimposed on more posterior regions.  A mixture of theta and alpha rhythms are seen from the central and temporal regions. The patient drowses with slowing to irregular, low voltage theta and beta activity.   The patient goes in to a light sleep with symmetrical sleep spindles, vertex central sharp transients and irregular slow activity.  Hyperventilation was not performed.  Intermittent photic stimulation was performed but failed to illicit any change in the tracing.    IMPRESSION: Normal electroencephalogram, awake, asleep and with activation procedures. There are no focal lateralizing or epileptiform features.   Thana Farr, MD [facility] [phone] 09/06/2012, 6:07 PM

## 2012-09-07 ENCOUNTER — Encounter (HOSPITAL_COMMUNITY): Payer: Self-pay | Admitting: Radiology

## 2012-09-07 ENCOUNTER — Observation Stay (HOSPITAL_COMMUNITY): Payer: BC Managed Care – PPO

## 2012-09-07 DIAGNOSIS — I517 Cardiomegaly: Secondary | ICD-10-CM

## 2012-09-07 LAB — GLUCOSE, CAPILLARY: Glucose-Capillary: 164 mg/dL — ABNORMAL HIGH (ref 70–99)

## 2012-09-07 LAB — LIPID PANEL
HDL: 43 mg/dL (ref >39)
LDL Cholesterol: 45 mg/dL (ref 0–99)
Triglycerides: 110 mg/dL (ref <150)
VLDL: 22 mg/dL (ref 0–40)

## 2012-09-07 MED ORDER — ASPIRIN EC 81 MG PO TBEC: 81.0000 mg | DELAYED_RELEASE_TABLET | Freq: Every day | ORAL | Status: DC

## 2012-09-07 MED ORDER — IOHEXOL 350 MG/ML SOLN
50.0000 mL | Freq: Once | INTRAVENOUS | Status: AC | PRN
  Administered 2012-09-07: 50 mL via INTRAVENOUS

## 2012-09-07 NOTE — Progress Notes (Deleted)
VASCULAR LAB PRELIMINARY  PRELIMINARY  PRELIMINARY  PRELIMINARY  Patient had CT angio of head and neck, showing no ICA stenosis.  Please advise if patient still needs Carotid Dopplers.  Brick Ketcher, RVT 09/07/2012, 3:19 PM

## 2012-09-07 NOTE — Progress Notes (Signed)
NEURO HOSPITALIST PROGRESS NOTE   SUBJECTIVE:                                                                                                                        Offers no neurological complains. EEG normal. MRI brain reviewed:abnormal patchy areas of T2 altered signal intensity involving portions of the temporal lobes,occipital lobes and parietal lobes bilaterally as well as the left frontal lobe. Some of these areas demonstrate mild enhancement/adjacent vascular stasis. These areas do not demonstrate restricted motion as may be seen with acute  infarcts. Minimal petechial hemorrhage associated with the left frontal lobe and right parietal lobe abnormality.   OBJECTIVE:                                                                                                                           Vital signs in last 24 hours: Temp:  [98 F (36.7 C)-98.1 F (36.7 C)] 98.1 F (36.7 C) (08/09 0615) Pulse Rate:  [79-94] 79 (08/09 0615) Resp:  [18-20] 18 (08/09 0615) BP: (111-147)/(75-90) 130/82 mmHg (08/09 0615) SpO2:  [98 %-100 %] 98 % (08/09 0615)  Intake/Output from previous day: 08/08 0701 - 08/09 0700 In: 1080 [P.O.:1080] Out: -  Intake/Output this shift:   Nutritional status: Carb Control  Past Medical History  Diagnosis Date  . Diabetes mellitus without complication   . Renal disorder   . Hyperlipidemia       Neurologic Exam:   Mental Status: Alert, awake, oriented x 4, thought content appropriate. Comprehension, naming, and repetition intact. Speech fluent without evidence of aphasia.  Able to follow 3 step commands without difficulty. Cranial Nerves: II: Discs flat bilaterally; Visual fields grossly normal, pupils equal, round, reactive to light and accommodation III,IV, VI: ptosis not present, extra-ocular motions intact bilaterally V,VII: smile symmetric, facial light touch sensation normal bilaterally VIII: hearing normal  bilaterally IX,X: gag reflex present XI: bilateral shoulder shrug XII: midline tongue extension Motor: Right : Upper extremity   5/5    Left:     Upper extremity   5/5  Lower extremity   5/5     Lower extremity   5/5 Tone and bulk:normal tone throughout; no atrophy noted Sensory: Pinprick and light touch intact throughout,  bilaterally Deep Tendon Reflexes:  1+ all over  Plantars: Right: downgoing   Left: downgoing Cerebellar: normal finger-to-nose,  normal heel-to-shin test Gait:  No ataxia. CV: pulses palpable throughout   Lab Results: Lab Results  Component Value Date/Time   CHOL 110 09/07/2012  5:40 AM   Lipid Panel  Recent Labs  09/07/12 0540  CHOL 110  TRIG 110  HDL 43  CHOLHDL 2.6  VLDL 22  LDLCALC 45    Studies/Results: Ct Head Wo Contrast  09/05/2012   *RADIOLOGY REPORT*  Clinical Data: Dizziness.  Syncope.  CT HEAD WITHOUT CONTRAST  Technique:  Contiguous axial images were obtained from the base of the skull through the vertex without contrast.  Comparison: No priors.  Findings: In the right occipital region there is an area of slightly ill-defined low attenuation region in the cortex and subcortical white matter on images 12-14, suspicious for acute/subacute ischemia.  No evidence of acute post-traumatic intracranial hemorrhage.  No focal mass, mass effect, hydrocephalus or abnormal intra or extra-axial fluid collections.  No acute displaced skull fractures are identified.  Mastoids are well pneumatized bilaterally.  Visualized paranasal sinuses are generally well pneumatized, with exception of multifocal mucosal thickening throughout the posterior right maxillary and bilateral sphenoid sinuses.  IMPRESSION: 1.  Area of potential acute/subacute ischemia in the right occipital lobe.  Further evaluation with brain MRI with and without gadolinium is recommended to better characterize this finding. 2.  No signs of significant acute traumatic injury to the head or brain. 3.   Paranasal sinus disease, as above.  These results were called by telephone on 09/05/2012 at 10:04 p.m. to Dr. Ranae Palms, who verbally acknowledged these results.   Original Report Authenticated By: Trudie Reed, M.D.   Mr Laqueta Jean Wo Contrast  09/06/2012   *RADIOLOGY REPORT*  Clinical Data: Diabetic hypertensive hyperlipidemic patient with episode of passing out.  MRI HEAD WITHOUT AND WITH CONTRAST  Technique:  Multiplanar, multiecho pulse sequences of the brain and surrounding structures were obtained according to standard protocol without and with intravenous contrast  Contrast: 20mL MULTIHANCE GADOBENATE DIMEGLUMINE 529 MG/ML IV SOLN  Comparison: 09/05/2012 CT.  No comparison MR.  Findings: Abnormal patchy areas of T2 altered signal intensity involving portions of the temporal lobes, occipital lobes and parietal lobes bilaterally as well as the left frontal lobe.  Some of these areas demonstrate mild enhancement/adjacent vascular stasis.  These areas do not demonstrate restricted motion as may be seen with acute infarcts. Minimal petechial hemorrhage associated with the left frontal lobe and right parietal lobe abnormality.  Findings may represent result of subacute infarcts (possibly embolic in origin) or posterior reversible encephalopathy syndrome. Other causes not entirely excluded in the proper clinical setting include result of; demyelinating process (ADEM), inflammatory process, vasculitis, infectious process, metabolic abnormality or toxic exposure.  Follow-up MR over the next few weeks may prove helpful to determine if there is a reversible component and exclude progression of these findings.  Major dural sinuses appear patent and result of cortical vein thrombosis felt to be unlikely as a cause of these findings.  Within the right occipital lobe there is a cleft like area of blood stained encephalomalacia suggestive of result of prior infarct (or prior trauma).  Tiny areas of blood breakdown  products scattered throughout the hemisphere bilaterally may represent result of prior hemorrhagic ischemia or remote trauma.  No hydrocephalus.  Major intracranial arterial structures are patent. Left vertebral artery is small after takeoff of the left PICA.  Ectatic  vertebral arteries and basilar artery.  Exophthalmos.  Mucosal thickening/partial opacification paranasal sinuses most notable sphenoid sinus air cells.  Cervical medullary junction, pituitary region and pineal region unremarkable.  No intracranial enhancing lesion separate from above described findings.  IMPRESSION: Abnormal patchy areas of T2 altered signal intensity involving portions of the temporal lobes, occipital lobes and parietal lobes bilaterally as well as the left frontal lobe.  Some of these areas demonstrate mild enhancement/adjacent vascular stasis.  These areas do not demonstrate restricted motion as may be seen with acute infarcts. Minimal petechial hemorrhage associated with the left frontal lobe and right parietal lobe abnormality.  Findings may represent result of subacute infarcts (possibly embolic in origin) or posterior reversible encephalopathy syndrome. Other causes not entirely excluded in the proper clinical setting include result of; demyelinating process (ADEM), inflammatory process, vasculitis, infectious process, metabolic abnormality or toxic exposure.  Follow-up MR over the next few weeks may prove helpful to determine if there is a reversible component and exclude progression of these findings.  Within the right occipital lobe there is a cleft like area of blood stained encephalomalacia suggestive of result of prior infarct (or prior trauma).  Tiny areas of blood breakdown products scattered throughout the hemisphere bilaterally may represent result of prior hemorrhagic ischemia or remote trauma.  Mucosal thickening/partial opacification paranasal sinuses most notable sphenoid sinus air cells.  This has been made a PRA  call report utilizing dashboard call feature.   Original Report Authenticated By: Lacy Duverney, M.D.   Dg Chest Port 1 View  09/05/2012   *RADIOLOGY REPORT*  Clinical Data: Syncope and dizziness.  PORTABLE CHEST - 1 VIEW  Comparison: No priors.  Findings: Film is severely under penetrated.  With these limitations in mind lung volumes are normal.  No consolidative airspace disease.  No pleural effusions.  No pneumothorax.  No pulmonary nodule or mass noted.  Pulmonary vasculature and the cardiomediastinal silhouette are within normal limits.  IMPRESSION: 1. No radiographic evidence of acute cardiopulmonary disease.   Original Report Authenticated By: Trudie Reed, M.D.    MEDICATIONS                                                                                                                       I have reviewed the patient's current medications.  ASSESSMENT/PLAN:                                                                                                           57 years old male without known risk factors for epilepsy, and  probable first occurrence of GTC seizure. Currently asymptomatic and unremarkable neuro-exam. EEG normal but abnormal MRI with findings that could represent result of subacute infarcts (possibly embolic in origin) or posterior reversible encephalopathy syndrome. Less likely demyelinating process (ADEM), inflammatory process, vasculitis, metabolic abnormality or toxic exposure. I will suggest carotid ultrasound, TTE (if feasible on the weekend, otherwise can be done as outpatient), CTA brain.  He is asymptomatic from a neurological standpoint must have follow up MRI brain with and without contrast and outpatient neurology follow evaluation in 2 weeks to ensure resolution of the previously described MRI findings.    Wyatt Portela, MD [facility] [phone]  09/07/2012, 8:17 AM

## 2012-09-07 NOTE — Progress Notes (Addendum)
[facility] PROGRESS NOTE  Juan Glenn ZOX:[id number] DOB: 1955/11/20 DOA: 09/05/2012 PCP: No primary provider on file.  Assessment/Plan: Syncope  -Secondary to generalized seizure  -MRI brain-abnormal patchy areas of altered T2 signal involving the temporal, occipital, parietal, and left frontal lobe -EEG--no epileptogenic foci -Appreciate neurology consultation  -Continue aspirin 325 mg  -troponin neg x 2 Generalized seizure -decision on AEDs per neurology Abnormal CT and MRI brain  -Concerned about stroke as the cause for the patient's general a seizure  -MRI brain as discussed  -Echocardiogram--pending -may ultimately need TEE -CT angiogram of the brain shows minimal intracranial atherosclerotic vascular disease Leukocytosis  -Likely reactive due to the patient's acute medical condition  -trending down -Urinalysis did not show pyuria  -Urine drug screen is negative  Diabetes mellitus type 2  -NovoLog sliding scale  -Check hemoglobin A1c--7.1 Hyperlipidemia  -Continue statin  Hypertension  -Continue lisinopril  Family Communication: wife at beside  Disposition Plan: Home when cleared by neurology       Procedures/Studies: Ct Angio Head W/cm &/or Wo Cm  09/07/2012   *RADIOLOGY REPORT*  Clinical Data:  Diabetic hypertensive hyperlipidemic patient presenting with episode of loss of consciousness and possible seizure.  Abnormal brain MR.  CT ANGIOGRAPHY HEAD  Technique:  Multidetector CT imaging of the head was performed using the standard protocol during bolus administration of intravenous contrast.  Multiplanar CT image reconstructions including MIPs were obtained to evaluate the vascular anatomy.  Contrast: 50mL OMNIPAQUE IOHEXOL 350 MG/ML SOLN  Comparison:  80 09/18/2012 brain MR.  Findings:  Cavernous segment internal carotid artery calcification with narrowing and ectasia.  No significant stenosis of the carotid terminus, M1 segment of the middle cerebral  artery or A1 segment of the anterior cerebral artery on either side.  The A1 segment of the left anterior cerebral artery is slightly smaller than the right which may be congenital variant.  Right vertebral artery is dominant.  Plaque with mild to moderate narrowing of the left vertebral artery at the level of the foramen magnum.  Fetal type origin of the right posterior cerebral artery may contribute to the small size of the basilar artery.  Basilar artery is narrowed and slightly irregular.  Branch vessel irregularity which in the present clinical setting is most likely related to result of atherosclerotic type changes rather than vasculitis.  MR noted scattered patchy white matter changes and minimal enhances/venous stasis not as well delineated on the present CT.  No intracranial hemorrhage or hydrocephalus.   Review of the MIP images confirms the above findings.  IMPRESSION: Mild intracranial atherosclerotic type changes as detailed above.   Original Report Authenticated By: Lacy Duverney, M.D.   Ct Head Wo Contrast  09/05/2012   *RADIOLOGY REPORT*  Clinical Data: Dizziness.  Syncope.  CT HEAD WITHOUT CONTRAST  Technique:  Contiguous axial images were obtained from the base of the skull through the vertex without contrast.  Comparison: No priors.  Findings: In the right occipital region there is an area of slightly ill-defined low attenuation region in the cortex and subcortical white matter on images 12-14, suspicious for acute/subacute ischemia.  No evidence of acute post-traumatic intracranial hemorrhage.  No focal mass, mass effect, hydrocephalus or abnormal intra or extra-axial fluid collections.  No acute displaced skull fractures are identified.  Mastoids are well pneumatized bilaterally.  Visualized paranasal sinuses are generally well pneumatized, with exception of multifocal mucosal thickening throughout the posterior right maxillary and bilateral sphenoid sinuses.  IMPRESSION: 1.  Area of potential  acute/subacute ischemia in the right occipital lobe.  Further evaluation with brain MRI with and without gadolinium is recommended to better characterize this finding. 2.  No signs of significant acute traumatic injury to the head or brain. 3.  Paranasal sinus disease, as above.  These results were called by telephone on 09/05/2012 at 10:04 p.m. to Dr. Ranae Palms, who verbally acknowledged these results.   Original Report Authenticated By: Trudie Reed, M.D.   Mr Laqueta Jean Wo Contrast  09/06/2012   *RADIOLOGY REPORT*  Clinical Data: Diabetic hypertensive hyperlipidemic patient with episode of passing out.  MRI HEAD WITHOUT AND WITH CONTRAST  Technique:  Multiplanar, multiecho pulse sequences of the brain and surrounding structures were obtained according to standard protocol without and with intravenous contrast  Contrast: 20mL MULTIHANCE GADOBENATE DIMEGLUMINE 529 MG/ML IV SOLN  Comparison: 09/05/2012 CT.  No comparison MR.  Findings: Abnormal patchy areas of T2 altered signal intensity involving portions of the temporal lobes, occipital lobes and parietal lobes bilaterally as well as the left frontal lobe.  Some of these areas demonstrate mild enhancement/adjacent vascular stasis.  These areas do not demonstrate restricted motion as may be seen with acute infarcts. Minimal petechial hemorrhage associated with the left frontal lobe and right parietal lobe abnormality.  Findings may represent result of subacute infarcts (possibly embolic in origin) or posterior reversible encephalopathy syndrome. Other causes not entirely excluded in the proper clinical setting include result of; demyelinating process (ADEM), inflammatory process, vasculitis, infectious process, metabolic abnormality or toxic exposure.  Follow-up MR over the next few weeks may prove helpful to determine if there is a reversible component and exclude progression of these findings.  Major dural sinuses appear patent and result of cortical vein  thrombosis felt to be unlikely as a cause of these findings.  Within the right occipital lobe there is a cleft like area of blood stained encephalomalacia suggestive of result of prior infarct (or prior trauma).  Tiny areas of blood breakdown products scattered throughout the hemisphere bilaterally may represent result of prior hemorrhagic ischemia or remote trauma.  No hydrocephalus.  Major intracranial arterial structures are patent. Left vertebral artery is small after takeoff of the left PICA.  Ectatic vertebral arteries and basilar artery.  Exophthalmos.  Mucosal thickening/partial opacification paranasal sinuses most notable sphenoid sinus air cells.  Cervical medullary junction, pituitary region and pineal region unremarkable.  No intracranial enhancing lesion separate from above described findings.  IMPRESSION: Abnormal patchy areas of T2 altered signal intensity involving portions of the temporal lobes, occipital lobes and parietal lobes bilaterally as well as the left frontal lobe.  Some of these areas demonstrate mild enhancement/adjacent vascular stasis.  These areas do not demonstrate restricted motion as may be seen with acute infarcts. Minimal petechial hemorrhage associated with the left frontal lobe and right parietal lobe abnormality.  Findings may represent result of subacute infarcts (possibly embolic in origin) or posterior reversible encephalopathy syndrome. Other causes not entirely excluded in the proper clinical setting include result of; demyelinating process (ADEM), inflammatory process, vasculitis, infectious process, metabolic abnormality or toxic exposure.  Follow-up MR over the next few weeks may prove helpful to determine if there is a reversible component and exclude progression of these findings.  Within the right occipital lobe there is a cleft like area of blood stained encephalomalacia suggestive of result of prior infarct (or prior trauma).  Tiny areas of blood breakdown  products scattered throughout the hemisphere bilaterally may represent result of prior hemorrhagic ischemia or remote trauma.  Mucosal thickening/partial opacification paranasal sinuses most notable sphenoid sinus air cells.  This has been made a PRA call report utilizing dashboard call feature.   Original Report Authenticated By: Lacy Duverney, M.D.   Dg Chest Port 1 View  09/05/2012   *RADIOLOGY REPORT*  Clinical Data: Syncope and dizziness.  PORTABLE CHEST - 1 VIEW  Comparison: No priors.  Findings: Film is severely under penetrated.  With these limitations in mind lung volumes are normal.  No consolidative airspace disease.  No pleural effusions.  No pneumothorax.  No pulmonary nodule or mass noted.  Pulmonary vasculature and the cardiomediastinal silhouette are within normal limits.  IMPRESSION: 1. No radiographic evidence of acute cardiopulmonary disease.   Original Report Authenticated By: Trudie Reed, M.D.         Subjective: Patient denies fevers, chills, chest pain, shortness breath, nausea, vomiting, diarrhea. He denies any headache, visual disturbance, arm or leg weakness.  Objective: Filed Vitals:   09/06/12 2112 09/07/12 0013 09/07/12 0615 09/07/12 0925  BP: 126/76 111/75 130/82 122/81  Pulse: 83 80 79 93  Temp: 98 F (36.7 C) 98.1 F (36.7 C) 98.1 F (36.7 C) 98.4 F (36.9 C)  TempSrc: Oral Oral Oral Oral  Resp: 20 18 18 18   Height:      Weight:      SpO2: 98% 99% 98% 95%    Intake/Output Summary (Last 24 hours) at 09/07/12 1225 Last data filed at 09/06/12 1742  Gross per 24 hour  Intake    360 ml  Output      0 ml  Net    360 ml   Weight change:  Exam:   General:  Pt is alert, follows commands appropriately, not in acute distress  HEENT: No icterus, No thrush, [place]/AT  Cardiovascular: RRR, S1/S2, no rubs, no gallops  Respiratory: CTA bilaterally, no wheezing, no crackles, no rhonchi  Abdomen: Soft/+BS, non tender, non distended, no  guarding  Extremities: No edema, No lymphangitis, No petechiae, No rashes, no synovitis  Data Reviewed: Basic Metabolic Panel:  Recent Labs Lab 09/05/12 2114 09/06/12 0855  NA 136 139  K 4.6 4.3  CL 97 106  CO2 11* 19  GLUCOSE 317* 199*  BUN 13 9  CREATININE 1.10 0.89  CALCIUM 9.7 9.2   Liver Function Tests:  Recent Labs Lab 09/05/12 2114  AST 34  ALT 42  ALKPHOS 86  BILITOT 0.2*  PROT 7.4  ALBUMIN 4.3   No results found for this basename: LIPASE, AMYLASE,  in the last 168 hours No results found for this basename: AMMONIA,  in the last 168 hours CBC:  Recent Labs Lab 09/05/12 2114 09/06/12 0855  WBC 20.4* 10.3  NEUTROABS  --  7.0  HGB 15.4 14.1  HCT 43.8 39.2  MCV 86.2 83.9  PLT 221 175   Cardiac Enzymes:  Recent Labs Lab 09/05/12 2114 09/06/12 0855  CKTOTAL 132  --   TROPONINI <0.30 <0.30   BNP: No components found with this basename: POCBNP,  CBG:  Recent Labs Lab 09/06/12 1128 09/06/12 1711 09/06/12 2112 09/07/12 0739 09/07/12 1138  GLUCAP 151* 113* 212* 173* 165*    No results found for this or any previous visit (from the past 240 hour(s)).   Scheduled Meds: . aspirin EC  325 mg Oral Daily  . atorvastatin  20 mg Oral q1800  . enoxaparin (LOVENOX) injection  40 mg Subcutaneous Q24H  . insulin aspart  0-9 Units Subcutaneous TID WC  . linagliptin  5  mg Oral Daily  . lisinopril  10 mg Oral Daily  . sodium chloride  3 mL Intravenous Q12H   Continuous Infusions:    Analeise Mccleery, DO  [facility] Pager [phone]  If 7PM-7AM, please contact night-coverage [email or web address] Password [email or web address] 09/07/2012, 12:25 PM   LOS: 2 days

## 2012-09-07 NOTE — Discharge Summary (Signed)
Physician Discharge Summary  Field Juan Glenn WUJ:[id number] DOB: 06/25/1955 DOA: 09/05/2012  PCP: No primary provider on file.  Admit date: 09/05/2012 Discharge date: 09/07/2012  Recommendations for Outpatient Follow-up:  1. Pt will need to follow up with PCP in 2 weeks post discharge 2. Please obtain BMP to evaluate electrolytes and kidney function 3. Please also check CBC to evaluate Hg and Hct levels 4. Follow up with [facility] in 2 weeks  Discharge Diagnoses:  Principal Problem:   Convulsions/seizures Active Problems:   Unspecified essential hypertension   Type II or unspecified type diabetes mellitus without mention of complication, not stated as uncontrolled   Abnormal CT of brain   Syncope Syncope  -Secondary to generalized seizure  -MRI brain with and without gadolinium--abnormal patchy areas of altered T2 signal involving the temporal, occipital, parietal, and left frontal lobe  -EEG--no epileptogenic foci  -Appreciate neurology consultation  -Continue aspirin 81mg   -troponin neg x 2  Generalized seizure  -Neurology followed the patient. They did not feel the patient needed any antiepileptic medications Abnormal CT and MRI brain  -Concerned about stroke as the cause for the patient's general a seizure  -MRI brain as discussed  -Echocardiogram--EF 60-65%, grade 1 diastolic dysfunction, no thrombi -may ultimately need TEE  -CT angiogram of the brain shows minimal intracranial atherosclerotic vascular disease  Leukocytosis  -Likely reactive due to the patient's acute medical condition  -trending down-WBC 10.3 on the day of discharge -Urinalysis did not show pyuria  -Urine drug screen is negative  Diabetes mellitus type 2  -NovoLog sliding scale  -Check hemoglobin A1c--7.1  -Patient may resume Januvia at the time of discharge Hyperlipidemia  -Continue statin  Hypertension  -Continue lisinopril  Family Communication: wife at beside   Discharge Condition:  Stable  Disposition:  Follow-up Information   Follow up with JAFFE, ADAM ROBERT, DO In 2 weeks.   Contact information:   [address] [place] [address] [phone]       Diet: Heart healthy Wt Readings from Last 3 Encounters:  09/06/12 97.342 kg (214 lb 9.6 oz)    History of present illness:   57 year old male with a history of hypertension, diabetes mellitus, hyperlipidemia was found unconscious in the bathroom of his workplace. The patient stated that earlier that evening at work he had an episode of transient blurred vision with associated nausea. As result he went to the bathroom because he felt like he wanted to vomit. There was a less than he remembered. The patient was found by a coworker and EMS was activated. The patient apparently was confused when he woke up. The patient denied any recent fevers, chills, chest discomfort, shortness of breath, nausea, vomiting, blurred vision, focal extremity weakness, dysuria, hematuria, dysarthria this past week. He had been in his usual state of health prior to this episode. In the ED, CT revealed abnormal ill-defined low attenuation area in the right occipital lobe. The patient was also found to have an abrasion on his tongue. Neurology was consulted to see the patient. MRI of the brain with and without gadolinium was obtained. Results are discussed above and below. Because of the MRI abnormality, there was concern of subacute stroke versus demyelination versus vasculitis. CT angiogram of brain was obtained and only showed minimal intracranial atherosclerotic vascular disease. Echocardiogram was obtained and showed no thrombi. EEG did not show any epileptic activity. The patient's white blood cell count gradually decreased and was normal on the day of discharge. TSH was 0.476. Hemoglobin 27.1.  Consultants: Neurology  Discharge Exam: Filed Vitals:   09/07/12 1424  BP: 126/72  Pulse: 94  Temp: 98.3 F (36.8 C)  Resp: 18   Filed  Vitals:   09/07/12 0013 09/07/12 0615 09/07/12 0925 09/07/12 1424  BP: 111/75 130/82 122/81 126/72  Pulse: 80 79 93 94  Temp: 98.1 F (36.7 C) 98.1 F (36.7 C) 98.4 F (36.9 C) 98.3 F (36.8 C)  TempSrc: Oral Oral Oral Oral  Resp: 18 18 18 18   Height:      Weight:      SpO2: 99% 98% 95% 96%   General: A&O x 3, NAD, pleasant, cooperative Cardiovascular: RRR, no rub, no gallop, no S3 Respiratory: CTAB, no wheeze, no rhonchi Abdomen:soft, nontender, nondistended, positive bowel sounds Extremities: No edema, No lymphangitis, no petechiae  Discharge Instructions  Discharge Orders   Future Orders Complete By Expires     Diet - low sodium heart healthy  As directed     Discharge instructions  As directed     Comments:      Follow up with Dr. Shon Millet, [facility] Take aspirin 81 mg, one tablet daily    Increase activity slowly  As directed         Medication List         aspirin EC 81 MG tablet  Take 1 tablet (81 mg total) by mouth daily.     CLARITIN PO  Take 1 tablet by mouth daily as needed (allergies).     lisinopril 10 MG tablet  Commonly known as:  PRINIVIL,ZESTRIL  Take 10 mg by mouth daily.     rosuvastatin 10 MG tablet  Commonly known as:  CRESTOR  Take 10 mg by mouth daily.     sitaGLIPtin 25 MG tablet  Commonly known as:  JANUVIA  Take 25 mg by mouth 2 (two) times daily.         The results of significant diagnostics from this hospitalization (including imaging, microbiology, ancillary and laboratory) are listed below for reference.    Significant Diagnostic Studies: Ct Angio Head W/cm &/or Wo Cm  09/07/2012   *RADIOLOGY REPORT*  Clinical Data:  Diabetic hypertensive hyperlipidemic patient presenting with episode of loss of consciousness and possible seizure.  Abnormal brain MR.  CT ANGIOGRAPHY HEAD  Technique:  Multidetector CT imaging of the head was performed using the standard protocol during bolus administration of intravenous contrast.   Multiplanar CT image reconstructions including MIPs were obtained to evaluate the vascular anatomy.  Contrast: 50mL OMNIPAQUE IOHEXOL 350 MG/ML SOLN  Comparison:  80 09/18/2012 brain MR.  Findings:  Cavernous segment internal carotid artery calcification with narrowing and ectasia.  No significant stenosis of the carotid terminus, M1 segment of the middle cerebral artery or A1 segment of the anterior cerebral artery on either side.  The A1 segment of the left anterior cerebral artery is slightly smaller than the right which may be congenital variant.  Right vertebral artery is dominant.  Plaque with mild to moderate narrowing of the left vertebral artery at the level of the foramen magnum.  Fetal type origin of the right posterior cerebral artery may contribute to the small size of the basilar artery.  Basilar artery is narrowed and slightly irregular.  Branch vessel irregularity which in the present clinical setting is most likely related to result of atherosclerotic type changes rather than vasculitis.  MR noted scattered patchy white matter changes and minimal enhances/venous stasis not as well delineated on the present CT.  No intracranial hemorrhage or hydrocephalus.   Review of the MIP images confirms the above findings.  IMPRESSION: Mild intracranial atherosclerotic type changes as detailed above.   Original Report Authenticated By: Lacy Duverney, M.D.   Ct Head Wo Contrast  09/05/2012   *RADIOLOGY REPORT*  Clinical Data: Dizziness.  Syncope.  CT HEAD WITHOUT CONTRAST  Technique:  Contiguous axial images were obtained from the base of the skull through the vertex without contrast.  Comparison: No priors.  Findings: In the right occipital region there is an area of slightly ill-defined low attenuation region in the cortex and subcortical white matter on images 12-14, suspicious for acute/subacute ischemia.  No evidence of acute post-traumatic intracranial hemorrhage.  No focal mass, mass effect, hydrocephalus  or abnormal intra or extra-axial fluid collections.  No acute displaced skull fractures are identified.  Mastoids are well pneumatized bilaterally.  Visualized paranasal sinuses are generally well pneumatized, with exception of multifocal mucosal thickening throughout the posterior right maxillary and bilateral sphenoid sinuses.  IMPRESSION: 1.  Area of potential acute/subacute ischemia in the right occipital lobe.  Further evaluation with brain MRI with and without gadolinium is recommended to better characterize this finding. 2.  No signs of significant acute traumatic injury to the head or brain. 3.  Paranasal sinus disease, as above.  These results were called by telephone on 09/05/2012 at 10:04 p.m. to Dr. Ranae Palms, who verbally acknowledged these results.   Original Report Authenticated By: Trudie Reed, M.D.   Mr Laqueta Jean Wo Contrast  09/06/2012   *RADIOLOGY REPORT*  Clinical Data: Diabetic hypertensive hyperlipidemic patient with episode of passing out.  MRI HEAD WITHOUT AND WITH CONTRAST  Technique:  Multiplanar, multiecho pulse sequences of the brain and surrounding structures were obtained according to standard protocol without and with intravenous contrast  Contrast: 20mL MULTIHANCE GADOBENATE DIMEGLUMINE 529 MG/ML IV SOLN  Comparison: 09/05/2012 CT.  No comparison MR.  Findings: Abnormal patchy areas of T2 altered signal intensity involving portions of the temporal lobes, occipital lobes and parietal lobes bilaterally as well as the left frontal lobe.  Some of these areas demonstrate mild enhancement/adjacent vascular stasis.  These areas do not demonstrate restricted motion as may be seen with acute infarcts. Minimal petechial hemorrhage associated with the left frontal lobe and right parietal lobe abnormality.  Findings may represent result of subacute infarcts (possibly embolic in origin) or posterior reversible encephalopathy syndrome. Other causes not entirely excluded in the proper clinical  setting include result of; demyelinating process (ADEM), inflammatory process, vasculitis, infectious process, metabolic abnormality or toxic exposure.  Follow-up MR over the next few weeks may prove helpful to determine if there is a reversible component and exclude progression of these findings.  Major dural sinuses appear patent and result of cortical vein thrombosis felt to be unlikely as a cause of these findings.  Within the right occipital lobe there is a cleft like area of blood stained encephalomalacia suggestive of result of prior infarct (or prior trauma).  Tiny areas of blood breakdown products scattered throughout the hemisphere bilaterally may represent result of prior hemorrhagic ischemia or remote trauma.  No hydrocephalus.  Major intracranial arterial structures are patent. Left vertebral artery is small after takeoff of the left PICA.  Ectatic vertebral arteries and basilar artery.  Exophthalmos.  Mucosal thickening/partial opacification paranasal sinuses most notable sphenoid sinus air cells.  Cervical medullary junction, pituitary region and pineal region unremarkable.  No intracranial enhancing lesion separate from above described findings.  IMPRESSION: Abnormal patchy areas  of T2 altered signal intensity involving portions of the temporal lobes, occipital lobes and parietal lobes bilaterally as well as the left frontal lobe.  Some of these areas demonstrate mild enhancement/adjacent vascular stasis.  These areas do not demonstrate restricted motion as may be seen with acute infarcts. Minimal petechial hemorrhage associated with the left frontal lobe and right parietal lobe abnormality.  Findings may represent result of subacute infarcts (possibly embolic in origin) or posterior reversible encephalopathy syndrome. Other causes not entirely excluded in the proper clinical setting include result of; demyelinating process (ADEM), inflammatory process, vasculitis, infectious process, metabolic  abnormality or toxic exposure.  Follow-up MR over the next few weeks may prove helpful to determine if there is a reversible component and exclude progression of these findings.  Within the right occipital lobe there is a cleft like area of blood stained encephalomalacia suggestive of result of prior infarct (or prior trauma).  Tiny areas of blood breakdown products scattered throughout the hemisphere bilaterally may represent result of prior hemorrhagic ischemia or remote trauma.  Mucosal thickening/partial opacification paranasal sinuses most notable sphenoid sinus air cells.  This has been made a PRA call report utilizing dashboard call feature.   Original Report Authenticated By: Lacy Duverney, M.D.   Dg Chest Port 1 View  09/05/2012   *RADIOLOGY REPORT*  Clinical Data: Syncope and dizziness.  PORTABLE CHEST - 1 VIEW  Comparison: No priors.  Findings: Film is severely under penetrated.  With these limitations in mind lung volumes are normal.  No consolidative airspace disease.  No pleural effusions.  No pneumothorax.  No pulmonary nodule or mass noted.  Pulmonary vasculature and the cardiomediastinal silhouette are within normal limits.  IMPRESSION: 1. No radiographic evidence of acute cardiopulmonary disease.   Original Report Authenticated By: Trudie Reed, M.D.     Microbiology: No results found for this or any previous visit (from the past 240 hour(s)).   Labs: Basic Metabolic Panel:  Recent Labs Lab 09/05/12 2114 09/06/12 0855  NA 136 139  K 4.6 4.3  CL 97 106  CO2 11* 19  GLUCOSE 317* 199*  BUN 13 9  CREATININE 1.10 0.89  CALCIUM 9.7 9.2   Liver Function Tests:  Recent Labs Lab 09/05/12 2114  AST 34  ALT 42  ALKPHOS 86  BILITOT 0.2*  PROT 7.4  ALBUMIN 4.3   No results found for this basename: LIPASE, AMYLASE,  in the last 168 hours No results found for this basename: AMMONIA,  in the last 168 hours CBC:  Recent Labs Lab 09/05/12 2114 09/06/12 0855  WBC 20.4*  10.3  NEUTROABS  --  7.0  HGB 15.4 14.1  HCT 43.8 39.2  MCV 86.2 83.9  PLT 221 175   Cardiac Enzymes:  Recent Labs Lab 09/05/12 2114 09/06/12 0855  CKTOTAL 132  --   TROPONINI <0.30 <0.30   BNP: No components found with this basename: POCBNP,  CBG:  Recent Labs Lab 09/06/12 1711 09/06/12 2112 09/07/12 0739 09/07/12 1138 09/07/12 1631  GLUCAP 113* 212* 173* 165* 164*    Time coordinating discharge:  Greater than 30 minutes  Signed:  Cornell Gaber, DO [facility] Pager: [phone] 09/07/2012, 5:20 PM

## 2012-09-07 NOTE — Progress Notes (Signed)
Echo Lab  2D Echocardiogram completed.  Annai Heick L Daelyn Pettaway, RDCS 09/07/2012 11:57 AM

## 2012-09-30 DIAGNOSIS — I6783 Posterior reversible encephalopathy syndrome: Secondary | ICD-10-CM

## 2012-09-30 HISTORY — DX: Posterior reversible encephalopathy syndrome: I67.83

## 2012-09-30 HISTORY — PX: OTHER SURGICAL HISTORY: SHX169

## 2012-10-03 ENCOUNTER — Encounter: Payer: Self-pay | Admitting: Neurology

## 2012-10-03 ENCOUNTER — Ambulatory Visit (INDEPENDENT_AMBULATORY_CARE_PROVIDER_SITE_OTHER): Payer: BC Managed Care – PPO | Admitting: Neurology

## 2012-10-03 VITALS — BP 140/78 | HR 76 | Temp 98.0°F | Resp 20 | Wt 208.0 lb

## 2012-10-03 DIAGNOSIS — R930 Abnormal findings on diagnostic imaging of skull and head, not elsewhere classified: Secondary | ICD-10-CM

## 2012-10-03 DIAGNOSIS — R9089 Other abnormal findings on diagnostic imaging of central nervous system: Secondary | ICD-10-CM

## 2012-10-03 DIAGNOSIS — R55 Syncope and collapse: Secondary | ICD-10-CM

## 2012-10-03 NOTE — Patient Instructions (Signed)
Your MRI is scheduled for Wednesday, Sept 10th at 5:00pm.   Please arrive to [facility], first floor admitting by 4:45pm [phone]. Entrance [facility])  Follow up 2-3 weeks here with Dr. Allena Katz.

## 2012-10-03 NOTE — Progress Notes (Signed)
[facility] Note - Initial Visit   Date: October 03, 2012  Juan Glenn MRN: [id number] DOB: 11/11/1955  Dear Dr Record:  Thank you for your kind referral of Juan Glenn for consultation of abnormal brain scan and ?seizure. Although his history is well known to you, please allow [place] to reiterate it for the purpose of our medical record. The patient was accompanied to the clinic by self.   History of Present Illness: Juan Glenn is a 57 y.o. year-old right-handed African American male with history of hypertension, hyperlipidemia, and diabetes mellitus presenting for evaluation of abnormal brain MRI.  He was recently admitted to [facility] on 8/7-8/9 where he was bought by ambulance after being found confused and unconscious at work.  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  Around 7:30pm, his vision started to get blurry and he felt nauseous so wanted to go to the bathroom.  He is required to change into/out of a protective gown to prevent contamination of the electrical parts so was on his way to get his gown off and collapsed inside the gowning room.  His co-workers said he acted disoriented before going to the bathroom. He was found unconscious with a bleeding nose by his co-workers who called EMS.  He said his tongue was sore at the tip and there was blood.  No urinary or bladder incontinence.  He does not remember collapsing, but recalls when EMS arrived.  By the time he was transported to [facility], he was back to his baseline, including vision and mental status.  He was then transferred to [facility] main campus for further evaluation.  Blood pressure on arrival to [facility] ED was 139/80.  His work-up included EEG, MRI brain wwo contrast, CTA, echo, and laboratory resting.  EEG was normal and MRI brain which showed abnormal patchy areas of T2 signal intensity involving bilateral temporal lobes, occipital lobes, parietal lobes, and  left frontal lobe. Some of these areas demonstrate mild enhancement. There is minimal petechial hemorrhage in the left frontal lobe and right parietal lobe.  His CTA showed mild intracranial atherosclerosis. No evidence for embolic source on echocardiogram.  Patient remained clinically stable and was discharged with out-patient follow-up.  His PCP reduced the dose of his lisinopril to 10mg  which he takes for renal protection because he was concerned this medication may have contributed to his presentation.  He denies any new symptoms.  No vision changes, headache, numbness/tingling, weakness, or disorientation.  Out-side paper records, electronic medical record, and images have been reviewed where available.   Past Medical History  Diagnosis Date  . Diabetes mellitus without complication 2009  . Renal disorder   . Hyperlipidemia     Past Surgical History  Procedure Laterality Date  . No surgical history  09/2012     Medications:  Current Outpatient Prescriptions on File Prior to Visit  Medication Sig Dispense Refill  . aspirin EC 81 MG tablet Take 1 tablet (81 mg total) by mouth daily.      Marland Kitchen lisinopril (PRINIVIL,ZESTRIL) 10 MG tablet Take 10 mg by mouth daily.      . Loratadine (CLARITIN PO) Take 1 tablet by mouth daily as needed (allergies).      . rosuvastatin (CRESTOR) 10 MG tablet Take 10 mg by mouth daily.       No current facility-administered medications on file prior to visit.    Allergies: No Known Allergies  Family History: Family History  Problem Relation Age of Onset  . Diabetes Mother   . Congestive Heart Failure Mother     Died, 43s  . Heart attack Father     Died, 41  . Diabetes Brother   . Hypertension Sister   . Cancer Brother     Died, 64    Social History: History   Social History  . Marital Status: Married    Spouse Name: N/A    Number of Children: N/A  . Years of Education: N/A   Occupational History  . Not on file.   Social History Main  Topics  . Smoking status: Never Smoker   . Smokeless tobacco: Never Used  . Alcohol Use: No  . Drug Use: No  . Sexual Activity: Not on file   Other Topics Concern  . 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  Lives with wife in [place] home.  No children.    Review of Systems:  CONSTITUTIONAL: No fevers, chills, night sweats, or weight loss.   EYES: No visual changes or eye pain ENT: No hearing changes.  No history of nose bleeds.   RESPIRATORY: No cough, wheezing and shortness of breath.   CARDIOVASCULAR: Negative for chest pain, and palpitations.   GI: Negative for abdominal discomfort, blood in stools or black stools.  No recent change in bowel habits.   GU:  No history of incontinence.   MUSCLOSKELETAL: No history of joint pain or swelling.  No myalgias.   SKIN: Negative for lesions, rash, and itching.   HEMATOLOGY/ONCOLOGY: Negative for prolonged bleeding, bruising easily, and swollen nodes.  No history of cancer.   ENDOCRINE: Negative for cold or heat intolerance, polydipsia or goiter.   PSYCH:  No depression or anxiety symptoms.   NEURO: As Above.   Vital Signs:  BP 140/78  Pulse 76  Temp(Src) 98 F (36.7 C)  Resp 20  Wt 208 lb (94.348 kg)  BMI 30.7 kg/m2 Pain Scale: 0 on a scale of 0-10   General Medical Exam: General:  Well appearing, comfortable.   Eyes/ENT: see cranial nerve examination.   Neck: No masses appreciated.  Full range of motion without tenderness.   Respiratory:  Clear to auscultation, good air entry bilaterally.   Cardiac:  Regular rate and rhythm, no murmur.   GI:  Soft, non-tender, non-distended abdomen.  Bowel sounds normal. No masses, organomegaly.   Back:  No pain to palpation of spinous processes.   Extremities:  No deformities, edema, or skin discoloration. Good capillary refill.   Skin:  Skin color, texture, turgor normal. No rashes or lesions.  Neurological Exam: MENTAL STATUS including  orientation to time, place, person, recent and remote memory, attention span and concentration, language, and fund of knowledge is normal.  Speech is not dysarthric.  CRANIAL NERVES: II:  No visual field defects.  Unremarkable fundi.   III-IV-VI: Pupils equal round and reactive to light.  Normal conjugate, extra-ocular eye movements in all directions of gaze.  No nystagmus.  No ptosis prior to or post sustained upgaze.   V:  Normal facial sensation.  Jaw jerk is absent.   VII:  Normal facial symmetry and movements.  No pathologic facial reflexes.  VIII:  Normal hearing and vestibular function.   IX-X:  Normal palatal movement.   XI:  Normal shoulder shrug and head rotation.   XII:  Normal tongue strength and range of motion, no deviation or fasciculation.  MOTOR:  No  atrophy, fasciculations or abnormal movements.  No pronator drift.  Tone is normal.    Right Upper Extremity:    Left Upper Extremity:    Deltoid  5/5   Deltoid  5/5   Biceps  5/5   Biceps  5/5   Triceps  5/5   Triceps  5/5   Wrist extensors  5/5   Wrist extensors  5/5   Wrist flexors  5/5   Wrist flexors  5/5   Finger extensors  5/5   Finger extensors  5/5   Finger flexors  5/5   Finger flexors  5/5   Dorsal interossei  5/5   Dorsal interossei  5/5   Abductor pollicis  5/5   Abductor pollicis  5/5   Tone (Ashworth scale)  0  Tone (Ashworth scale)  0   Right Lower Extremity:    Left Lower Extremity:    Hip flexors  5/5   Hip flexors  5/5   Hip extensors  5/5   Hip extensors  5/5   Knee flexors  5/5   Knee flexors  5/5   Knee extensors  5/5   Knee extensors  5/5   Dorsiflexors  5/5   Dorsiflexors  5/5   Plantarflexors  5/5   Plantarflexors  5/5   Toe extensors  5/5   Toe extensors  5/5   Toe flexors  5/5   Toe flexors  5/5   Tone (Ashworth scale)  0  Tone (Ashworth scale)  0   MSRs:  Right                                                                 Left brachioradialis 3+  brachioradialis 3+  biceps 3+  biceps  3+  triceps 2+  triceps 2+  patellar 3+  patellar 3+  ankle jerk 2+  ankle jerk 2+  Hoffman no  Hoffman no  plantar response down  plantar response down  Crossed adductors present bilaterally.  SENSORY:  Normal and symmetric perception of light touch, pinprick, vibration, and proprioception.  Romberg's sign absent.  No extinction on double simultaneous stimulation. Normal graphesthesia and stereogenesis.   COORDINATION/GAIT: Normal finger-to- nose-finger and heel-to-shin.  Intact rapid alternating movements bilaterally.  Able to rise from a chair without using arms.  Gait narrow based and stable. Tandem and stressed gait intact.    IMPRESSION: Mr. Mesa is a 57 year old male who presents for evaluation of abnormal brain MRI and after being evaluated at the hospital for first time seizure.  His neurological examination is notable for brisk reflexes throughout but these are symmetric and there are no other motor neuron findings.  Review of his MRI shows white matter changes predominantly in involving the parietal and occipital lobes, with milder changes in the frontal and temporal regions.  His history and imaging findings are concerning for posterior reversible encephalopathy but other inflammatory, infectious, neoplastic, or metabolic white matter disorders has not been excluded.  I would like to obtain a repeat MRI brain with and without contrast.  If white matter changes are improved this would be consistent with PRES, however if white matter disease burden remains present or increased we will need to initiate CSF testing and/or repeat EEG.  Given this is his  first-time seizure and normal examination, I will hold off on AEDs, however if imaging does not show resolution of changes or he develops new symptoms, I have a low threshold on starting them.   PLAN/RECOMMENDATIONS:  1. MRI Brain wwo contrast 2. I have counseled the patient extensively regarding the possible etiologies, reviewed images,  and management plan which is understands and agrees with. 3. Return to clinic in 3 weeks.  Thank you for allowing me to participate in patient's care.  If I can answer any additional questions, I would be pleased to do so.    Sincerely,    Melana Hingle K. Allena Katz, DO

## 2012-10-09 ENCOUNTER — Ambulatory Visit (HOSPITAL_COMMUNITY)
Admission: RE | Admit: 2012-10-09 | Discharge: 2012-10-09 | Disposition: A | Discharge status: Home / Self Care | Payer: BC Managed Care – PPO | Source: Ambulatory Visit | Attending: Neurology | Admitting: Neurology

## 2012-10-09 DIAGNOSIS — Z8673 Personal history of transient ischemic attack (TIA), and cerebral infarction without residual deficits: Secondary | ICD-10-CM | POA: Insufficient documentation

## 2012-10-09 DIAGNOSIS — R55 Syncope and collapse: Secondary | ICD-10-CM | POA: Insufficient documentation

## 2012-10-09 MED ORDER — GADOBENATE DIMEGLUMINE 529 MG/ML IV SOLN
20.0000 mL | Freq: Once | INTRAVENOUS | Status: AC
  Administered 2012-10-09: 20 mL via INTRAVENOUS

## 2012-10-10 ENCOUNTER — Telehealth: Payer: Self-pay | Admitting: Neurology

## 2012-10-10 NOTE — Telephone Encounter (Signed)
Called patient with results of MRI brain, which shows improved white matter changes, but there was no answer. Message left on voicemail.  Dejean Tribby K. Allena Katz, DO

## 2012-10-24 ENCOUNTER — Encounter: Payer: Self-pay | Admitting: Neurology

## 2012-10-24 ENCOUNTER — Ambulatory Visit (INDEPENDENT_AMBULATORY_CARE_PROVIDER_SITE_OTHER): Payer: BC Managed Care – PPO | Admitting: Neurology

## 2012-10-24 VITALS — BP 126/82 | HR 72 | Temp 97.9°F | Resp 16 | Ht 69.0 in | Wt 209.0 lb

## 2012-10-24 DIAGNOSIS — G9349 Other encephalopathy: Secondary | ICD-10-CM

## 2012-10-24 DIAGNOSIS — I6783 Posterior reversible encephalopathy syndrome: Secondary | ICD-10-CM

## 2012-10-24 NOTE — Progress Notes (Signed)
[facility] Neurology Division  Follow-up Visit   Date: 10/24/2012    Juan Glenn MRN: [id number] DOB: 1955/06/25   Interim History: Juan Glenn is a 57 y.o. year-old right-handed African American male with history hyperlipidemia and diabetes mellitus presenting for follow-up of PRES.  The patient was accompanied to the clinic by self.  He was last seen in the clinic on 10/03/2012.  Since his last visit, he has had no new symptoms and is doing great.  He did visit his nephrologist who stopped his lisinopril, which he was taking for renal protection, but after reviewing the literature there are reports of PRES with this medication. His blood pressure has remained stable and today it is 126/82.  Denies any seizures, vision changes, headache, numbness/tingling, weakness, or disorientation.  He has a repeat MRI brain on 10/09/2012 which showed improving cortical and subcortical hyperintensities and without enhancement.  History of present illness: He was admitted to [facility] on 8/7-8/9 where he was bought by ambulance after being found confused and unconscious at work. 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 Around 7:30pm, his vision started to get blurry and he felt nauseous so wanted to go to the bathroom. He is required to change into/out of a protective gown to prevent contamination of the electrical parts so was on his way to get his gown off and collapsed inside the gowning room. His co-workers said he acted disoriented before going to the bathroom. He was found unconscious with a bleeding nose by his co-workers who called EMS. He said his tongue was sore at the tip and there was blood. No urinary or bladder incontinence. He did not remember collapsing, but recalls when EMS arrived. By the time he was transported to [facility], he was back to his baseline, including vision and mental status. He was then transferred to [facility] main campus for  further evaluation. Blood pressure on arrival to [facility] ED was 139/80.   His work-up included EEG, MRI brain wwo contrast, CTA, echo, and laboratory resting. EEG was normal and MRI brain which showed abnormal patchy areas of T2 signal intensity involving bilateral temporal lobes, occipital lobes, parietal lobes, and left frontal lobe. Some of these areas demonstrate mild enhancement. There is minimal petechial hemorrhage in the left frontal lobe and right parietal lobe. His CTA showed mild intracranial atherosclerosis. No evidence for embolic source on echocardiogram. Patient remained clinically stable and was discharged with out-patient follow-up. His PCP reduced the dose of his lisinopril to 10mg  which he takes for renal protection because he was concerned this medication may have contributed to his presentation.    Medications:  Current Outpatient Prescriptions on File Prior to Visit  Medication Sig Dispense Refill  . aspirin EC 81 MG tablet Take 1 tablet (81 mg total) by mouth daily.      Marland Kitchen glimepiride (AMARYL) 2 MG tablet 2 mg. Take 1 tablet (2 mg total) by mouth 2 (two) times daily before meals.      . Linagliptin-Metformin HCl (JENTADUETO) 2.05-998 MG TABS 1 tablet. Take 1 tablet by mouth 2 (two) times daily.      . Loratadine (CLARITIN PO) Take 1 tablet by mouth daily as needed (allergies).      . rosuvastatin (CRESTOR) 10 MG tablet Take 10 mg by mouth daily.       No current facility-administered medications on file prior to visit.    Allergies: No Known Allergies   Review of Systems:  CONSTITUTIONAL: No fevers, chills, night sweats, or weight loss.   EYES: No visual changes or eye pain ENT: No hearing changes.  No history of nose bleeds.   RESPIRATORY: No cough, wheezing and shortness of breath.   CARDIOVASCULAR: Negative for chest pain, and palpitations.   GI: Negative for abdominal discomfort, blood in stools or black stools.  No recent change in bowel habits.   GU:  No history of  incontinence.   MUSCLOSKELETAL: No history of joint pain or swelling.  No myalgias.   SKIN: Negative for lesions, rash, and itching.   HEMATOLOGY/ONCOLOGY: Negative for prolonged bleeding, bruising easily, and swollen nodes.  No history of cancer.   ENDOCRINE: Negative for cold or heat intolerance, polydipsia or goiter.   PSYCH:  No depression or anxiety symptoms.   NEURO: As Above.   Vital Signs:  BP 126/82  Pulse 72  Temp(Src) 97.9 F (36.6 C)  Resp 16  Ht 5\' 9"  (1.753 m)  Wt 209 lb (94.802 kg)  BMI 30.85 kg/m2   Neurological Exam: MENTAL STATUS including orientation to time, place, person, recent and remote memory, attention span and concentration, language, and fund of knowledge is normal.  Speech is not dysarthric.  CRANIAL NERVES: II:  No visual field defects.  III-IV-VI: Pupils equal round and reactive to light.  Normal conjugate, extra-ocular eye movements in all directions of gaze.  No nystagmus.  No ptosis.   V:  Normal facial sensation.   VII:  Normal facial symmetry and movements.  VIII:  Normal hearing and vestibular function.   IX-X:  Normal palatal movement.   XI:  Normal shoulder shrug and head rotation.   XII:  Normal tongue strength and range of motion, no deviation or fasciculation.  MOTOR:  No atrophy, fasciculations or abnormal movements.  No pronator drift.  Tone is normal.    Right Upper Extremity:    Left Upper Extremity:    Deltoid  5/5   Deltoid  5/5   Biceps  5/5   Biceps  5/5   Triceps  5/5   Triceps  5/5   Wrist extensors  5/5   Wrist extensors  5/5   Wrist flexors  5/5   Wrist flexors  5/5   Finger extensors  5/5   Finger extensors  5/5   Finger flexors  5/5   Finger flexors  5/5   Dorsal interossei  5/5   Dorsal interossei  5/5   Abductor pollicis  5/5   Abductor pollicis  5/5   Tone (Ashworth scale)  0  Tone (Ashworth scale)  0   Right Lower Extremity:    Left Lower Extremity:    Hip flexors  5/5   Hip flexors  5/5   Hip extensors  5/5    Hip extensors  5/5   Knee flexors  5/5   Knee flexors  5/5   Knee extensors  5/5   Knee extensors  5/5   Dorsiflexors  5/5   Dorsiflexors  5/5   Plantarflexors  5/5   Plantarflexors  5/5   Toe extensors  5/5   Toe extensors  5/5   Toe flexors  5/5   Toe flexors  5/5   Tone (Ashworth scale)  0  Tone (Ashworth scale)  0   MSRs:  Right  Left brachioradialis 2+  brachioradialis 2+  biceps 2+  biceps 2+  triceps 2+  triceps 2+  patellar 3+  patellar 3+  ankle jerk 2+  ankle jerk 2+  Hoffman no  Hoffman no  plantar response down  plantar response down   SENSORY:  Normal and symmetric perception of light touch and vibration.    COORDINATION/GAIT: Normal finger-to- nose-finger and heel-to-shin.  Intact rapid alternating movements bilaterally.  Able to rise from a chair without using arms.  Gait narrow based and stable. Tandem and stressed gait intact.   Data: MRI brain 10/09/2012: 1. Improving cortical and subcortical T2 hyperintensities this.  2. Previously seen intravascular enhancement is no longer present.  3. This most likely represents resolving posterior reversible encephalopathy or H D E. No definite encephalomalacia is evident to confirm previous infarcts.  4. 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    IMPRESSION: Juan Glenn is a 57 year old male who presents for evaluation of abnormal brain MRI and after being evaluated at the hospital for first time seizure.  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  He continues to have white matter changes involving the parietal and occipital lobes, with milder changes in the frontal and temporal regions, but overall they are improved and there is no longer enhancement of these  lesions.  Exam remains non-focal and he is clinically doing great.  I will continue to follow him clinically, but am happy to hear that he is doing so well and imaging is showing improvement.  Underlying etiology remains uncertain especially since his BP was not elevated and there was no other associated risk factors, but there have been some cases of PRES with lisinopril, although this would be my first case.   PLAN/RECOMMENDATIONS:  1.  Reviewed and compared images with patient 2.  Discussed possible etiologies, including medications, pathophysiology, and course of disorder 3.  Return to clinic in 57-months, or sooner as needed  The duration of this appointment visit was 30 minutes of face-to-face time with the patient.  Greater than 50% of this time was spent in counseling, explanation of diagnosis, planning of further management, and coordination of care.   Thank you for allowing me to participate in patient's care.  If I can answer any additional questions, I would be pleased to do so.    Sincerely,    Donika K. Allena Katz, DO

## 2012-10-24 NOTE — Patient Instructions (Addendum)
Follow up in three months.   

## 2012-11-16 DIAGNOSIS — R809 Proteinuria, unspecified: Secondary | ICD-10-CM | POA: Insufficient documentation

## 2012-12-05 ENCOUNTER — Inpatient Hospital Stay (HOSPITAL_COMMUNITY): Payer: BC Managed Care – PPO

## 2012-12-05 ENCOUNTER — Emergency Department (HOSPITAL_COMMUNITY): Payer: BC Managed Care – PPO

## 2012-12-05 ENCOUNTER — Encounter (HOSPITAL_COMMUNITY): Payer: Self-pay | Admitting: Emergency Medicine

## 2012-12-05 ENCOUNTER — Inpatient Hospital Stay (HOSPITAL_COMMUNITY)
Admission: EM | Admit: 2012-12-05 | Discharge: 2012-12-18 | DRG: 077 | Disposition: A | Discharge status: Home Health | Payer: BC Managed Care – PPO | Attending: Internal Medicine | Admitting: Internal Medicine

## 2012-12-05 DIAGNOSIS — Z833 Family history of diabetes mellitus: Secondary | ICD-10-CM

## 2012-12-05 DIAGNOSIS — I369 Nonrheumatic tricuspid valve disorder, unspecified: Secondary | ICD-10-CM

## 2012-12-05 DIAGNOSIS — E1129 Type 2 diabetes mellitus with other diabetic kidney complication: Secondary | ICD-10-CM | POA: Diagnosis present

## 2012-12-05 DIAGNOSIS — J80 Acute respiratory distress syndrome: Secondary | ICD-10-CM

## 2012-12-05 DIAGNOSIS — E870 Hyperosmolality and hypernatremia: Secondary | ICD-10-CM | POA: Diagnosis present

## 2012-12-05 DIAGNOSIS — E874 Mixed disorder of acid-base balance: Secondary | ICD-10-CM | POA: Diagnosis present

## 2012-12-05 DIAGNOSIS — D696 Thrombocytopenia, unspecified: Secondary | ICD-10-CM | POA: Diagnosis present

## 2012-12-05 DIAGNOSIS — G40401 Other generalized epilepsy and epileptic syndromes, not intractable, with status epilepticus: Secondary | ICD-10-CM

## 2012-12-05 DIAGNOSIS — J9589 Other postprocedural complications and disorders of respiratory system, not elsewhere classified: Secondary | ICD-10-CM

## 2012-12-05 DIAGNOSIS — Z7982 Long term (current) use of aspirin: Secondary | ICD-10-CM

## 2012-12-05 DIAGNOSIS — I10 Essential (primary) hypertension: Secondary | ICD-10-CM | POA: Diagnosis present

## 2012-12-05 DIAGNOSIS — E785 Hyperlipidemia, unspecified: Secondary | ICD-10-CM | POA: Diagnosis present

## 2012-12-05 DIAGNOSIS — J9600 Acute respiratory failure, unspecified whether with hypoxia or hypercapnia: Secondary | ICD-10-CM

## 2012-12-05 DIAGNOSIS — J690 Pneumonitis due to inhalation of food and vomit: Secondary | ICD-10-CM | POA: Diagnosis present

## 2012-12-05 DIAGNOSIS — Z8673 Personal history of transient ischemic attack (TIA), and cerebral infarction without residual deficits: Secondary | ICD-10-CM

## 2012-12-05 DIAGNOSIS — R55 Syncope and collapse: Secondary | ICD-10-CM

## 2012-12-05 DIAGNOSIS — I4891 Unspecified atrial fibrillation: Secondary | ICD-10-CM | POA: Diagnosis present

## 2012-12-05 DIAGNOSIS — N179 Acute kidney failure, unspecified: Secondary | ICD-10-CM | POA: Diagnosis present

## 2012-12-05 DIAGNOSIS — D649 Anemia, unspecified: Secondary | ICD-10-CM | POA: Diagnosis present

## 2012-12-05 DIAGNOSIS — I674 Hypertensive encephalopathy: Principal | ICD-10-CM | POA: Diagnosis present

## 2012-12-05 DIAGNOSIS — R569 Unspecified convulsions: Secondary | ICD-10-CM | POA: Diagnosis present

## 2012-12-05 DIAGNOSIS — G40901 Epilepsy, unspecified, not intractable, with status epilepticus: Secondary | ICD-10-CM

## 2012-12-05 DIAGNOSIS — R5381 Other malaise: Secondary | ICD-10-CM | POA: Diagnosis present

## 2012-12-05 DIAGNOSIS — E119 Type 2 diabetes mellitus without complications: Secondary | ICD-10-CM

## 2012-12-05 DIAGNOSIS — E875 Hyperkalemia: Secondary | ICD-10-CM | POA: Diagnosis present

## 2012-12-05 DIAGNOSIS — R9089 Other abnormal findings on diagnostic imaging of central nervous system: Secondary | ICD-10-CM

## 2012-12-05 HISTORY — DX: Failed or difficult intubation, initial encounter: T88.4XXA

## 2012-12-05 LAB — CBC WITH DIFFERENTIAL/PLATELET
Basophils Absolute: 0 10*3/uL (ref 0.0–0.1)
Basophils Absolute: 0 10*3/uL (ref 0.0–0.1)
Basophils Relative: 0 % (ref 0–1)
Eosinophils Absolute: 0.6 10*3/uL (ref 0.0–0.7)
Eosinophils Relative: 2 % (ref 0–5)
HCT: 50.5 % (ref 39.0–52.0)
Hemoglobin: 17.3 g/dL — ABNORMAL HIGH (ref 13.0–17.0)
Hemoglobin: 17.7 g/dL — ABNORMAL HIGH (ref 13.0–17.0)
Lymphocytes Relative: 52 % — ABNORMAL HIGH (ref 12–46)
Lymphocytes Relative: 11 % — ABNORMAL LOW (ref 12–46)
Lymphs Abs: 16 10*3/uL — ABNORMAL HIGH (ref 0.7–4.0)
Lymphs Abs: 1.3 10*3/uL (ref 0.7–4.0)
MCH: 31.2 pg (ref 26.0–34.0)
MCHC: 34.3 g/dL (ref 30.0–36.0)
MCV: 91.2 fL (ref 78.0–100.0)
MCV: 87 fL (ref 78.0–100.0)
Monocytes Absolute: 2.8 10*3/uL — ABNORMAL HIGH (ref 0.1–1.0)
Monocytes Relative: 9 % (ref 3–12)
Monocytes Relative: 4 % (ref 3–12)
Neutro Abs: 11.4 10*3/uL — ABNORMAL HIGH (ref 1.7–7.7)
Neutro Abs: 10.5 10*3/uL — ABNORMAL HIGH (ref 1.7–7.7)
Neutrophils Relative %: 37 % — ABNORMAL LOW (ref 43–77)
Neutrophils Relative %: 85 % — ABNORMAL HIGH (ref 43–77)
Platelets: 231 10*3/uL (ref 150–400)
Platelets: 174 10*3/uL (ref 150–400)
RBC: 5.54 MIL/uL (ref 4.22–5.81)
RBC: 5.83 MIL/uL — ABNORMAL HIGH (ref 4.22–5.81)
RDW: 12.4 % (ref 11.5–15.5)
WBC: 30.8 10*3/uL — ABNORMAL HIGH (ref 4.0–10.5)
WBC: 12.4 10*3/uL — ABNORMAL HIGH (ref 4.0–10.5)

## 2012-12-05 LAB — POCT I-STAT 3, ART BLOOD GAS (G3+)
Acid-base deficit: 23 mmol/L — ABNORMAL HIGH (ref 0.0–2.0)
Acid-base deficit: 12 mmol/L — ABNORMAL HIGH (ref 0.0–2.0)
Bicarbonate: 9.5 mEq/L — ABNORMAL LOW (ref 20.0–24.0)
Bicarbonate: 15.3 mEq/L — ABNORMAL LOW (ref 20.0–24.0)
O2 Saturation: 100 %
O2 Saturation: 93 %
O2 Saturation: 97 %
Patient temperature: 35.1
Patient temperature: 100.4
TCO2: 11 mmol/L (ref 0–100)
TCO2: 16 mmol/L (ref 0–100)
pCO2 arterial: 40.9 mmHg (ref 35.0–45.0)
pCO2 arterial: 37.5 mmHg (ref 35.0–45.0)
pH, Arterial: 6.959 — CL (ref 7.350–7.450)
pH, Arterial: 7.193 — CL (ref 7.350–7.450)
pO2, Arterial: 257 mmHg — ABNORMAL HIGH (ref 80.0–100.0)

## 2012-12-05 LAB — GLUCOSE, CAPILLARY
Glucose-Capillary: 317 mg/dL — ABNORMAL HIGH (ref 70–99)
Glucose-Capillary: 261 mg/dL — ABNORMAL HIGH (ref 70–99)
Glucose-Capillary: 255 mg/dL — ABNORMAL HIGH (ref 70–99)
Glucose-Capillary: 191 mg/dL — ABNORMAL HIGH (ref 70–99)
Glucose-Capillary: 205 mg/dL — ABNORMAL HIGH (ref 70–99)
Glucose-Capillary: 165 mg/dL — ABNORMAL HIGH (ref 70–99)
Glucose-Capillary: 175 mg/dL — ABNORMAL HIGH (ref 70–99)
Glucose-Capillary: 158 mg/dL — ABNORMAL HIGH (ref 70–99)
Glucose-Capillary: 137 mg/dL — ABNORMAL HIGH (ref 70–99)
Glucose-Capillary: 133 mg/dL — ABNORMAL HIGH (ref 70–99)
Glucose-Capillary: 173 mg/dL — ABNORMAL HIGH (ref 70–99)
Glucose-Capillary: 153 mg/dL — ABNORMAL HIGH (ref 70–99)

## 2012-12-05 LAB — URINALYSIS, ROUTINE W REFLEX MICROSCOPIC
Bilirubin Urine: NEGATIVE
Glucose, UA: 250 mg/dL — AB
Ketones, ur: NEGATIVE mg/dL
Ketones, ur: NEGATIVE mg/dL
Leukocytes, UA: NEGATIVE
Leukocytes, UA: NEGATIVE
Nitrite: NEGATIVE
Nitrite: NEGATIVE
Protein, ur: 100 mg/dL — AB
Protein, ur: 30 mg/dL — AB
Specific Gravity, Urine: 1.01 (ref 1.005–1.030)
Urobilinogen, UA: 0.2 mg/dL (ref 0.0–1.0)
pH: 5 (ref 5.0–8.0)

## 2012-12-05 LAB — COMPREHENSIVE METABOLIC PANEL
ALT: 37 U/L (ref 0–53)
ALT: 36 U/L (ref 0–53)
AST: 40 U/L — ABNORMAL HIGH (ref 0–37)
AST: 45 U/L — ABNORMAL HIGH (ref 0–37)
Albumin: 4.5 g/dL (ref 3.5–5.2)
Albumin: 3.5 g/dL (ref 3.5–5.2)
Alkaline Phosphatase: 113 U/L (ref 39–117)
Alkaline Phosphatase: 70 U/L (ref 39–117)
BUN: 13 mg/dL (ref 6–23)
BUN: 18 mg/dL (ref 6–23)
CO2: 8 mEq/L — CL (ref 19–32)
CO2: 14 mEq/L — ABNORMAL LOW (ref 19–32)
Calcium: 11.2 mg/dL — ABNORMAL HIGH (ref 8.4–10.5)
Calcium: 8.2 mg/dL — ABNORMAL LOW (ref 8.4–10.5)
Chloride: 97 mEq/L (ref 96–112)
Chloride: 106 mEq/L (ref 96–112)
Creatinine, Ser: 1.31 mg/dL (ref 0.50–1.35)
Creatinine, Ser: 1.4 mg/dL — ABNORMAL HIGH (ref 0.50–1.35)
GFR calc Af Amer: 68 mL/min — ABNORMAL LOW (ref >90)
GFR calc Af Amer: 63 mL/min — ABNORMAL LOW (ref >90)
GFR calc non Af Amer: 59 mL/min — ABNORMAL LOW (ref >90)
GFR calc non Af Amer: 54 mL/min — ABNORMAL LOW (ref >90)
Glucose, Bld: 293 mg/dL — ABNORMAL HIGH (ref 70–99)
Glucose, Bld: 322 mg/dL — ABNORMAL HIGH (ref 70–99)
Potassium: 4.8 mEq/L (ref 3.5–5.1)
Potassium: 5.5 mEq/L — ABNORMAL HIGH (ref 3.5–5.1)
Sodium: 145 mEq/L (ref 135–145)
Sodium: 140 mEq/L (ref 135–145)
Total Bilirubin: 0.3 mg/dL (ref 0.3–1.2)
Total Bilirubin: 0.5 mg/dL (ref 0.3–1.2)
Total Protein: 8.6 g/dL — ABNORMAL HIGH (ref 6.0–8.3)
Total Protein: 6.3 g/dL (ref 6.0–8.3)

## 2012-12-05 LAB — CG4 I-STAT (LACTIC ACID): Lactic Acid, Venous: 16.72 mmol/L — ABNORMAL HIGH (ref 0.5–2.2)

## 2012-12-05 LAB — RAPID URINE DRUG SCREEN, HOSP PERFORMED
Amphetamines: NOT DETECTED
Barbiturates: NOT DETECTED
Benzodiazepines: NOT DETECTED
Cocaine: NOT DETECTED
Opiates: NOT DETECTED
Tetrahydrocannabinol: NOT DETECTED

## 2012-12-05 LAB — LACTIC ACID, PLASMA: Lactic Acid, Venous: 9.6 mmol/L — ABNORMAL HIGH (ref 0.5–2.2)

## 2012-12-05 LAB — PROTIME-INR
INR: 1.21 (ref 0.00–1.49)
INR: 1.1 (ref 0.00–1.49)
Prothrombin Time: 15 seconds (ref 11.6–15.2)
Prothrombin Time: 14 seconds (ref 11.6–15.2)

## 2012-12-05 LAB — PHOSPHORUS: Phosphorus: 4 mg/dL (ref 2.3–4.6)

## 2012-12-05 LAB — TYPE AND SCREEN
ABO/RH(D): O POS
Antibody Screen: NEGATIVE

## 2012-12-05 LAB — CK: Total CK: 140 U/L (ref 7–232)

## 2012-12-05 LAB — URINE MICROSCOPIC-ADD ON

## 2012-12-05 LAB — MRSA PCR SCREENING: MRSA by PCR: NEGATIVE

## 2012-12-05 LAB — TSH: TSH: 2.281 u[IU]/mL (ref 0.350–4.500)

## 2012-12-05 LAB — BASIC METABOLIC PANEL
BUN: 16 mg/dL (ref 6–23)
Calcium: 8.4 mg/dL (ref 8.4–10.5)
Chloride: 104 mEq/L (ref 96–112)
Creatinine, Ser: 1.2 mg/dL (ref 0.50–1.35)
GFR calc Af Amer: 76 mL/min — ABNORMAL LOW (ref >90)
GFR calc non Af Amer: 65 mL/min — ABNORMAL LOW (ref >90)
Glucose, Bld: 394 mg/dL — ABNORMAL HIGH (ref 70–99)
Potassium: 5.7 mEq/L — ABNORMAL HIGH (ref 3.5–5.1)
Sodium: 138 mEq/L (ref 135–145)

## 2012-12-05 LAB — PRO B NATRIURETIC PEPTIDE: Pro B Natriuretic peptide (BNP): 85.4 pg/mL (ref 0–125)

## 2012-12-05 LAB — ABO/RH: ABO/RH(D): O POS

## 2012-12-05 LAB — MAGNESIUM: Magnesium: 3 mg/dL — ABNORMAL HIGH (ref 1.5–2.5)

## 2012-12-05 LAB — APTT
aPTT: 30 seconds (ref 24–37)
aPTT: 23 seconds — ABNORMAL LOW (ref 24–37)

## 2012-12-05 LAB — TROPONIN I: Troponin I: <0.3 ng/mL (ref <0.30)

## 2012-12-05 MED ORDER — SODIUM CHLORIDE 0.9 % IV BOLUS (SEPSIS)
750.0000 mL | Freq: Once | INTRAVENOUS | Status: AC
  Administered 2012-12-05: 750 mL via INTRAVENOUS

## 2012-12-05 MED ORDER — SODIUM CHLORIDE 0.9 % IV SOLN
INTRAVENOUS | Status: DC
  Administered 2012-12-05: 100 mL/h via INTRAVENOUS

## 2012-12-05 MED ORDER — SODIUM CHLORIDE 0.9 % IV BOLUS (SEPSIS)
1000.0000 mL | Freq: Once | INTRAVENOUS | Status: AC
  Administered 2012-12-05: 1000 mL via INTRAVENOUS

## 2012-12-05 MED ORDER — ETOMIDATE 2 MG/ML IV SOLN
30.0000 mg | Freq: Once | INTRAVENOUS | Status: AC
  Administered 2012-12-05: 30 mg via INTRAVENOUS

## 2012-12-05 MED ORDER — SODIUM CHLORIDE 0.9 % IV SOLN
1600.0000 mg | Freq: Once | INTRAVENOUS | Status: DC
  Filled 2012-12-05: qty 32

## 2012-12-05 MED ORDER — BIOTENE DRY MOUTH MT LIQD
15.0000 mL | Freq: Four times a day (QID) | OROMUCOSAL | Status: DC
  Administered 2012-12-05 – 2012-12-17 (×49): 15 mL via OROMUCOSAL

## 2012-12-05 MED ORDER — FENTANYL CITRATE 0.05 MG/ML IJ SOLN: 50.0000 ug | Freq: Once | INTRAMUSCULAR | Status: DC

## 2012-12-05 MED ORDER — INSULIN GLARGINE 100 UNIT/ML ~~LOC~~ SOLN
30.0000 [IU] | SUBCUTANEOUS | Status: DC
  Administered 2012-12-06: 30 [IU] via SUBCUTANEOUS
  Filled 2012-12-05 (×2): qty 0.3

## 2012-12-05 MED ORDER — SODIUM CHLORIDE 0.9 % IV SOLN
25.0000 ug/h | INTRAVENOUS | Status: DC
  Administered 2012-12-06: 100 ug/h via INTRAVENOUS
  Administered 2012-12-06: 200 ug/h via INTRAVENOUS
  Administered 2012-12-07: 300 ug/h via INTRAVENOUS
  Administered 2012-12-08 (×2): 250 ug/h via INTRAVENOUS
  Administered 2012-12-09: 200 ug/h via INTRAVENOUS
  Administered 2012-12-09: 250 ug/h via INTRAVENOUS
  Administered 2012-12-09: 300 ug/h via INTRAVENOUS
  Administered 2012-12-10 – 2012-12-14 (×8): 200 ug/h via INTRAVENOUS
  Filled 2012-12-05 (×21): qty 50

## 2012-12-05 MED ORDER — PROPOFOL 10 MG/ML IV EMUL
5.0000 ug/kg/min | Freq: Once | INTRAVENOUS | Status: AC
  Administered 2012-12-05: 5 ug/kg/min via INTRAVENOUS

## 2012-12-05 MED ORDER — ONDANSETRON HCL 4 MG/2ML IJ SOLN
INTRAMUSCULAR | Status: AC
  Administered 2012-12-05: 4 mg
  Filled 2012-12-05: qty 2

## 2012-12-05 MED ORDER — CHLORHEXIDINE GLUCONATE 0.12 % MT SOLN
15.0000 mL | Freq: Two times a day (BID) | OROMUCOSAL | Status: DC
  Administered 2012-12-05 – 2012-12-18 (×27): 15 mL via OROMUCOSAL
  Filled 2012-12-05 (×30): qty 15

## 2012-12-05 MED ORDER — LORAZEPAM 2 MG/ML IJ SOLN
2.0000 mg | Freq: Once | INTRAMUSCULAR | Status: AC
  Administered 2012-12-05: 2 mg via INTRAVENOUS

## 2012-12-05 MED ORDER — VANCOMYCIN HCL 10 G IV SOLR
1250.0000 mg | Freq: Two times a day (BID) | INTRAVENOUS | Status: DC
  Administered 2012-12-05 – 2012-12-08 (×6): 1250 mg via INTRAVENOUS
  Filled 2012-12-05 (×7): qty 1250

## 2012-12-05 MED ORDER — INSULIN ASPART 100 UNIT/ML ~~LOC~~ SOLN: 2.0000 [IU] | SUBCUTANEOUS | Status: DC

## 2012-12-05 MED ORDER — DEXTROSE 10 % IV SOLN: INTRAVENOUS | Status: DC | PRN

## 2012-12-05 MED ORDER — PANTOPRAZOLE SODIUM 40 MG IV SOLR
40.0000 mg | INTRAVENOUS | Status: DC
  Administered 2012-12-05 – 2012-12-07 (×3): 40 mg via INTRAVENOUS
  Filled 2012-12-05 (×5): qty 40

## 2012-12-05 MED ORDER — LORAZEPAM 2 MG/ML IJ SOLN
INTRAMUSCULAR | Status: AC
  Administered 2012-12-05: 2 mg via INTRAVENOUS
  Filled 2012-12-05: qty 1

## 2012-12-05 MED ORDER — FENTANYL CITRATE 0.05 MG/ML IJ SOLN
25.0000 ug/h | INTRAMUSCULAR | Status: DC
  Administered 2012-12-05: 25 ug/h via INTRAVENOUS
  Filled 2012-12-05: qty 50

## 2012-12-05 MED ORDER — SODIUM CHLORIDE 0.9 % IV SOLN
INTRAVENOUS | Status: DC
  Administered 2012-12-06: 05:00:00 via INTRAVENOUS

## 2012-12-05 MED ORDER — NOREPINEPHRINE BITARTRATE 1 MG/ML IJ SOLN
2.0000 ug/min | INTRAVENOUS | Status: DC
  Administered 2012-12-05: 5 ug/min via INTRAVENOUS
  Filled 2012-12-05: qty 4

## 2012-12-05 MED ORDER — SODIUM CHLORIDE 0.9 % IV SOLN: INTRAVENOUS | Status: DC

## 2012-12-05 MED ORDER — INSULIN ASPART 100 UNIT/ML ~~LOC~~ SOLN
2.0000 [IU] | SUBCUTANEOUS | Status: DC
  Administered 2012-12-06: 4 [IU] via SUBCUTANEOUS
  Administered 2012-12-06: 6 [IU] via SUBCUTANEOUS
  Administered 2012-12-06: 4 [IU] via SUBCUTANEOUS

## 2012-12-05 MED ORDER — PROPOFOL 10 MG/ML IV EMUL
5.0000 ug/kg/min | INTRAVENOUS | Status: DC
  Administered 2012-12-05: 15.12 ug/kg/min via INTRAVENOUS
  Administered 2012-12-05: 20 ug/kg/min via INTRAVENOUS
  Administered 2012-12-05: 10 ug/kg/min via INTRAVENOUS
  Filled 2012-12-05 (×3): qty 100

## 2012-12-05 MED ORDER — PNEUMOCOCCAL VAC POLYVALENT 25 MCG/0.5ML IJ INJ
0.5000 mL | INJECTION | INTRAMUSCULAR | Status: DC
  Filled 2012-12-05: qty 0.5

## 2012-12-05 MED ORDER — SODIUM CHLORIDE 0.9 % IV SOLN
3.0000 g | Freq: Four times a day (QID) | INTRAVENOUS | Status: DC
  Administered 2012-12-05 – 2012-12-11 (×26): 3 g via INTRAVENOUS
  Filled 2012-12-05 (×28): qty 3

## 2012-12-05 MED ORDER — SUCCINYLCHOLINE CHLORIDE 20 MG/ML IJ SOLN
120.0000 mg | Freq: Once | INTRAMUSCULAR | Status: AC
  Administered 2012-12-05: 120 mg via INTRAVENOUS
  Filled 2012-12-05: qty 6

## 2012-12-05 MED ORDER — PROPOFOL 10 MG/ML IV EMUL
INTRAVENOUS | Status: AC
  Administered 2012-12-05: 5 ug/kg/min via INTRAVENOUS
  Filled 2012-12-05: qty 100

## 2012-12-05 MED ORDER — SODIUM CHLORIDE 0.9 % IV SOLN
1500.0000 mg | Freq: Once | INTRAVENOUS | Status: AC
  Administered 2012-12-05: 1500 mg via INTRAVENOUS
  Filled 2012-12-05: qty 30

## 2012-12-05 MED ORDER — DILTIAZEM HCL 100 MG IV SOLR
5.0000 mg/h | Freq: Once | INTRAVENOUS | Status: AC
  Administered 2012-12-05: 5 mg/h via INTRAVENOUS

## 2012-12-05 MED ORDER — VASOPRESSIN 20 UNIT/ML IJ SOLN
0.0300 [IU]/min | INTRAMUSCULAR | Status: DC
  Administered 2012-12-05 – 2012-12-06 (×2): 0.03 [IU]/min via INTRAVENOUS
  Filled 2012-12-05 (×2): qty 2.5

## 2012-12-05 MED ORDER — NOREPINEPHRINE BITARTRATE 1 MG/ML IJ SOLN
2.0000 ug/min | INTRAVENOUS | Status: DC
  Administered 2012-12-06: 40 ug/min via INTRAVENOUS
  Administered 2012-12-06: 20 ug/min via INTRAVENOUS
  Filled 2012-12-05 (×6): qty 16

## 2012-12-05 MED ORDER — INSULIN REGULAR HUMAN 100 UNIT/ML IJ SOLN
INTRAMUSCULAR | Status: DC
  Administered 2012-12-05: 2.4 [IU]/h via INTRAVENOUS
  Filled 2012-12-05: qty 1

## 2012-12-05 NOTE — Progress Notes (Signed)
Sputum induction collected and sent to main lab.

## 2012-12-05 NOTE — Progress Notes (Signed)
ANTIBIOTIC CONSULT NOTE - Follow Up  Pharmacy Consult for Unasyn/Vancomycin Indication: Aspiration PNA  No Known Allergies  Patient Measurements: Height: 5\' 9"  (175.3 cm) Weight: 211 lb 6.7 oz (95.9 kg) IBW/kg (Calculated) : 70.7  Vital Signs: Temp: 100.4 F (38 C) (11/06 1600) Temp src: Oral (11/06 1600) BP: 81/58 mmHg (11/06 1700) Pulse Rate: 95 (11/06 1700) Intake/Output from previous day: 11/05 0701 - 11/06 0700 In: 1727.8 [I.V.:1627.8; IV Piggyback:100] Out: 1600 [Urine:1600] Intake/Output from this shift: Total I/O In: 2555 [I.V.:1605; IV Piggyback:950] Out: 500 [Urine:500]  Labs:  Recent Labs  12/05/12 0020 12/05/12 0545 12/05/12 0715  WBC 30.8* 12.4*  --   HGB 17.3* 17.7*  --   PLT 231 174  --   CREATININE 1.31 1.20 1.40*   Estimated Creatinine Clearance: 66.5 ml/min (by C-G formula based on Cr of 1.4).  Microbiology: Recent Results (from the past 720 hour(s))  MRSA PCR SCREENING     Status: None   Collection Time    12/05/12  4:47 AM      Result Value Range Status   MRSA by PCR NEGATIVE  NEGATIVE Final   Comment:            The GeneXpert MRSA Assay (FDA     approved for NASAL specimens     only), is one component of a     comprehensive MRSA colonization     surveillance program. It is not     intended to diagnose MRSA     infection nor to guide or     monitor treatment for     MRSA infections.   Medical History: Past Medical History  Diagnosis Date  . Diabetes mellitus without complication 2009  . Renal disorder   . Hyperlipidemia   . Difficult intubation    Medications: med rec pending  Assessment: Seizure and HTN 57 year old male with history of PRES syndrome 2 months ago presentes to the hospital with HTN and AMS. Patient was given 2 mg of IV ativan, seizure stopped but then vomited and aspirated. Intubated in ED. CXR with B pulmonary infiltrates. PMH: DM, renal disorder, HLD, HTN  Scr 1.31, WBC 30.8, estimated CrCl 82  PM:  His  antibiotics are being broadened to include Vancomycin.  Goal of Therapy:  Vancomycin trough 15-20 mcg/ml  Plan:  Continue Unasyn 3g IV q6hrs Vancomycin 1250 mg every 12hr  Nadara Mustard, PharmD., MS Clinical Pharmacist Pager:  [phone] Thank you for allowing pharmacy to be part of this patients care team. 12/05/2012,5:52 PM

## 2012-12-05 NOTE — Consult Note (Signed)
Reason for Consult:Seizures Referring Physician: Juleen China  CC: Seizures  HPI: Juan Glenn is an 57 y.o. male admitted in August with seizures and PRES.  Has had no further seizures since discharge and was not on anticonvulsants.  Was discharged on Lisinopril.  There was some question as to whether this may have contributed to his PRES.  This was discontinued.  BP was normal at last follow up but he has not been checking this on a regular basis.  Follow up MRI on an outpatient basis showed improvement.  Tonight had a seizure at home.  EMS was called and the patient was transported to [facility].  Patient has had multiple further seizures.  Patient was unable to protect his airway, was intubated, sedated and loaded with Dilantin.    Past Medical History  Diagnosis Date  . Diabetes mellitus without complication 2009  . Renal disorder   . Hyperlipidemia     Past Surgical History  Procedure Laterality Date  . No surgical history  09/2012    Family History  Problem Relation Age of Onset  . Diabetes Mother   . Congestive Heart Failure Mother     Died, 69s  . Heart attack Father     Died, 43  . Diabetes Brother   . Hypertension Sister   . Cancer Brother     Died, 93    Social History:  reports that he has never smoked. He has never used smokeless tobacco. He reports that he does not drink alcohol or use illicit drugs.  No Known Allergies  Medications: I have reviewed the patient's current medications. Prior to Admission:  Current outpatient prescriptions: aspirin EC 81 MG tablet, Take 1 tablet (81 mg total) by mouth daily., Disp: , Rfl: ;   glimepiride (AMARYL) 2 MG tablet, Take 2 mg by mouth 2 (two) times daily before a meal. Take 1 tablet (2 mg total) by mouth 2 (two) times daily before meals., Disp: , Rfl: ;   Linagliptin-Metformin HCl (JENTADUETO) 2.05-998 MG TABS, 1 tablet. Take 1 tablet by mouth 2 (two) times daily., Disp: , Rfl:  Multiple Vitamin (MULTIVITAMIN WITH MINERALS)  TABS tablet, Take 1 tablet by mouth daily., Disp: , Rfl: ;  rosuvastatin (CRESTOR) 10 MG tablet, Take 10 mg by mouth daily., Disp: , Rfl: ;   Loratadine (CLARITIN PO), Take 1 tablet by mouth daily as needed (allergies)., Disp: , Rfl:   ROS: Unable to obtain-patient intubated  Physical Examination: Blood pressure 136/74, pulse 96, temperature 97.3 F (36.3 C), temperature source Rectal, resp. rate 20, weight 94.8 kg (208 lb 15.9 oz), SpO2 100.00%.  Neurologic Examination Mental Status: Patient does not respond to verbal stimuli.  Does not respond to deep sternal rub.  Does not follow commands.  No verbalizations noted.  Cranial Nerves: II: patient does not respond confrontation bilaterally, pupils right 2 mm, left 2 mm,and reactive bilaterally III,IV,VI: doll's response absent bilaterally. V,VII: corneal reflex reduced bilaterally  VIII: patient does not respond to verbal stimuli IX,X: gag reflex reduced, XI: trapezius strength unable to test bilaterally XII: tongue strength unable to test Motor: Extremities flaccid throughout.  No spontaneous movement noted.  No purposeful movements noted. Sensory: Does not respond to noxious stimuli in any extremity. Deep Tendon Reflexes:  Absent throughout. Plantars: mute bilaterally Cerebellar: Unable to perform    Laboratory Studies:   Basic Metabolic Panel:  Recent Labs Lab 12/05/12 0020  NA 145  K 4.8  CL 97  CO2 8*  GLUCOSE 293*  BUN  13  CREATININE 1.31  CALCIUM 11.2*  MG 3.0*    Liver Function Tests:  Recent Labs Lab 12/05/12 0020  AST 40*  ALT 37  ALKPHOS 113  BILITOT 0.3  PROT 8.6*  ALBUMIN 4.5   No results found for this basename: LIPASE, AMYLASE,  in the last 168 hours No results found for this basename: AMMONIA,  in the last 168 hours  CBC:  Recent Labs Lab 12/05/12 0020  WBC 30.8*  NEUTROABS 11.4*  HGB 17.3*  HCT 50.5  MCV 91.2  PLT 231    Cardiac Enzymes:  Recent Labs Lab 12/05/12 0020   CKTOTAL 140    BNP: No components found with this basename: POCBNP,   CBG: No results found for this basename: GLUCAP,  in the last 168 hours  Microbiology: No results found for this or any previous visit.  Coagulation Studies:  Recent Labs  12/05/12 0020  LABPROT 15.0  INR 1.21    Urinalysis:  Recent Labs Lab 12/05/12 0041  COLORURINE YELLOW  LABSPEC 1.010  PHURINE 5.0  GLUCOSEU 250*  HGBUR MODERATE*  BILIRUBINUR NEGATIVE  KETONESUR NEGATIVE  PROTEINUR 100*  UROBILINOGEN 0.2  NITRITE NEGATIVE  LEUKOCYTESUR NEGATIVE    Lipid Panel:     Component Value Date/Time   CHOL 110 09/07/2012 0540   TRIG 110 09/07/2012 0540   HDL 43 09/07/2012 0540   CHOLHDL 2.6 09/07/2012 0540   VLDL 22 09/07/2012 0540   LDLCALC 45 09/07/2012 0540    HgbA1C:  Lab Results  Component Value Date   HGBA1C 7.1* 09/06/2012    Urine Drug Screen:     Component Value Date/Time   LABOPIA NONE DETECTED 12/05/2012 0041   COCAINSCRNUR NONE DETECTED 12/05/2012 0041   LABBENZ NONE DETECTED 12/05/2012 0041   AMPHETMU NONE DETECTED 12/05/2012 0041   THCU NONE DETECTED 12/05/2012 0041   LABBARB NONE DETECTED 12/05/2012 0041    Alcohol Level: No results found for this basename: ETH,  in the last 168 hours  Other results: EKG:  Atrial fibrillation with rapid RVR.  Imaging: Ct Head Wo Contrast  12/05/2012   CLINICAL DATA:  56-year- male with seizure. Altered mental status. Last seen normal 2300 hrs. Initial encounter.  EXAM: CT HEAD WITHOUT CONTRAST  TECHNIQUE: Contiguous axial images were obtained from the base of the skull through the vertex without intravenous contrast.  COMPARISON:  Brain MRI 10/09/2012 and earlier.  FINDINGS: Left side nasal airway in place. Improved sphenoid sinus aeration. Other Visualized paranasal sinuses and mastoids are clear.  Dysconjugate gaze. Negative scalp soft tissues. No acute osseous abnormality identified.  Calcified atherosclerosis at the skull base. Stable and normal  cerebral volume. No ventriculomegaly. Small chronic focus of encephalomalacia in the undersurface of the right occipital lobe appears to be stable. Elsewhere gray-white matter differentiation is stable and within normal limits. No midline shift, mass effect, or evidence of intracranial mass lesion. No acute intracranial hemorrhage identified. No evidence of cortically based acute infarction identified. No suspicious intracranial vascular hyperdensity.  IMPRESSION: No acute intracranial abnormality. Stable non contrast CT appearance of the brain.   Electronically Signed   By: Augusto Gamble M.D.   On: 12/05/2012 00:45   Dg Chest Portable 1 View  12/05/2012   CLINICAL DATA:  History of seizures. Intubated patient.  EXAM: PORTABLE CHEST - 1 VIEW  COMPARISON:  Chest x-ray 09/05/2012.  FINDINGS: An endotracheal tube is in place with tip 4.1 cm above the carina. Nasogastric tube terminates in the mid esophagus  Lung volumes are low. Patchy perihilar opacities bilaterally. Crowding of the pulmonary vasculature, without frank pulmonary edema. Heart size is normal. Mediastinal contours are slightly distorted by patient's positioning.  IMPRESSION: 1. Support apparatus, as above. Take note of the high position of the nasogastric tube, which could be advanced at least 15-20 cm for more optimal placement. 2. Low lung volumes with patchy perihilar opacities. This is of uncertain etiology and significance, but given the patient's history of seizures, the possibility of aspiration is not excluded.   Electronically Signed   By: Trudie Reed M.D.   On: 12/05/2012 01:44     Assessment/Plan: 57 year old male with recurrent seizure activity.  Has a history of PRES with outpatient improvement noted in imaging and no further seizure activity until this evening.  Patient off antihypertensives.  Was hypertensive on presentation this evening.  Patient now on Cardizem and BP controlled.  Seizures may very well be again a manifestation  of PRES.  Has had multiple seizures this evening though.  No further seizure activity noted at this time.  CT of the head reviewed and shows no acute changes.    Recommendations: 1.  Maintenance Dilantin to be started at 100mg  IV q 8hours.  Dilantin level in AM. 2.  EEG in AM.  Will consider need for continued Dilantin at that time 3.  MRI of the brain without contrast 4.  Agree with continued BP control  Thana Farr, MD [facility] [phone] 12/05/2012, 2:21 AM

## 2012-12-05 NOTE — Progress Notes (Signed)
Chaplain was paged to ED to respond to a seizure pt.  Chaplain met pt wife in consultation A and began to offer emotional support.  Chaplain and pt wife had a meaningful discussion on God, faith, and church.  Chaplain stayed until other family arrived and offered emotional support.  Pt wife seemed grateful for the visit.

## 2012-12-05 NOTE — Progress Notes (Signed)
PULMONARY  / CRITICAL CARE MEDICINE  Name: Juan Glenn MRN: [id number] DOB: Jan 26, 1956    ADMISSION DATE:  12/05/2012 CONSULTATION DATE:  11/36/2014  REFERRING MD :  EDP PRIMARY SERVICE: PCCM  CHIEF COMPLAINT:  Seizure and HTN  BRIEF PATIENT DESCRIPTION: 57 year old male with history of PRES syndrome 2 months ago presentes to the hospital with hypertensive crisis and AMS. (Was not on anti -hypertensives on last discharge ) Patient was given 2 mg of IV ativan, seizure stopped but then vomited and aspirated.  EDP intubated.  PCCM to admit and neuro to see.  SIGNIFICANT EVENTS / STUDIES:  11/6 intubated, seizure and HTN. 11/6 requiring high FIo2 & PEEP  LINES / TUBES: ETT 11/6>>> R IJ TLC 11/6 >>> OGT 11/6 >>>  CULTURES: Blood 11/6>> Urine 11/6>> Sputum 11/6>>  ANTIBIOTICS: Unasyn 11/6>>  SUBJECTIVE:  Pt intubated.  Mildly febrile to 101.4.  Vitals stable.  VITAL SIGNS: Temp:  [95.2 F (35.1 C)-101.4 F (38.6 C)] 101.4 F (38.6 C) (11/06 1200) Pulse Rate:  [82-147] 109 (11/06 1300) Resp:  [16-41] 19 (11/06 1300) BP: (73-178)/(28-114) 73/28 mmHg (11/06 1300) SpO2:  [90 %-100 %] 92 % (11/06 1300) FiO2 (%):  [60 %-100 %] 60 % (11/06 1232) Weight:  [208 lb 15.9 oz (94.8 kg)-211 lb 6.7 oz (95.9 kg)] 211 lb 6.7 oz (95.9 kg) (11/06 0445) HEMODYNAMICS:   VENTILATOR SETTINGS: Vent Mode:  [-] PRVC FiO2 (%):  [60 %-100 %] 60 % Set Rate:  [18 bmp] 18 bmp Vt Set:  [600 mL] 600 mL PEEP:  [5 cmH20-10 cmH20] 10 cmH20 Plateau Pressure:  [14 cmH20-24 cmH20] 14 cmH20 INTAKE / OUTPUT: Intake/Output     11/05 0701 - 11/06 0700 11/06 0701 - 11/07 0700   I.V. (mL/kg) 1627.8 (17) 915.9 (9.6)   IV Piggyback 100 100   Total Intake(mL/kg) 1727.8 (18) 1015.9 (10.6)   Urine (mL/kg/hr) 1600 300 (0.5)   Total Output 1600 300   Net +127.8 +715.9          PHYSICAL EXAMINATION: General:  Acutely ill, sedated in bed. Neuro:  Sedated, intubated, RASS -4 HEENT:  /AT, pupils  pinpoint. Cardiovascular:  RRR, No M/R/G. Lungs:  Coarse BS diffusely. Abdomen:  Soft, NT, ND and +BS. Musculoskeletal:  No gross deformities.  No edema.. Skin:  Intact.  LABS:  CBC Recent Labs     12/05/12  0020  12/05/12  0545  WBC  30.8*  12.4*  HGB  17.3*  17.7*  HCT  50.5  50.7  PLT  231  174   Coag's Recent Labs     12/05/12  0020  12/05/12  0545  APTT  30  23*  INR  1.21  1.10   BMET Recent Labs     12/05/12  0020  12/05/12  0545  12/05/12  0715  NA  145  138  140  K  4.8  5.7*  5.5*  CL  97  104  106  CO2  8*  13*  14*  BUN  13  16  18   CREATININE  1.31  1.20  1.40*  GLUCOSE  293*  394*  322*   Electrolytes Recent Labs     12/05/12  0020  12/05/12  0545  12/05/12  0715  CALCIUM  11.2*  8.4  8.2*  MG  3.0*  2.6*   --   PHOS   --   4.0   --    Sepsis Markers No results found for  this basename: LACTICACIDVEN, PROCALCITON, O2SATVEN,  in the last 72 hours ABG Recent Labs     12/05/12  0214  12/05/12  1054  PHART  6.959*  7.218*  PCO2ART  40.9  37.5  PO2ART  257.0*  79.0*   Liver Enzymes Recent Labs     12/05/12  0020  12/05/12  0715  AST  40*  45*  ALT  37  36  ALKPHOS  113  70  BILITOT  0.3  0.5  ALBUMIN  4.5  3.5   Cardiac Enzymes Recent Labs     12/05/12  0715  TROPONINI  <0.30  PROBNP  85.4   Glucose Recent Labs     12/05/12  0449  GLUCAP  317*    Imaging Ct Head Wo Contrast  12/05/2012   CLINICAL DATA:  56-year- male with seizure. Altered mental status. Last seen normal 2300 hrs. Initial encounter.  EXAM: CT HEAD WITHOUT CONTRAST  TECHNIQUE: Contiguous axial images were obtained from the base of the skull through the vertex without intravenous contrast.  COMPARISON:  Brain MRI 10/09/2012 and earlier.  FINDINGS: Left side nasal airway in place. Improved sphenoid sinus aeration. Other Visualized paranasal sinuses and mastoids are clear.  Dysconjugate gaze. Negative scalp soft tissues. No acute osseous abnormality  identified.  Calcified atherosclerosis at the skull base. Stable and normal cerebral volume. No ventriculomegaly. Small chronic focus of encephalomalacia in the undersurface of the right occipital lobe appears to be stable. Elsewhere gray-white matter differentiation is stable and within normal limits. No midline shift, mass effect, or evidence of intracranial mass lesion. No acute intracranial hemorrhage identified. No evidence of cortically based acute infarction identified. No suspicious intracranial vascular hyperdensity.  IMPRESSION: No acute intracranial abnormality. Stable non contrast CT appearance of the brain.   Electronically Signed   By: Augusto Gamble M.D.   On: 12/05/2012 00:45   Dg Chest Port 1 View  12/05/2012   CLINICAL DATA:  Low oxygen saturation values.  EXAM: PORTABLE CHEST - 1 VIEW  COMPARISON:  December 05, 2012, at 1:27 a.m.  FINDINGS: Since the earlier study density in the inferior aspect of the right upper lobe has increased and is now confluent. There remain patchy areas of a fluffy increased interstitial density in both lungs which are more conspicuous now. The cardiac silhouette is not enlarged.  The endotracheal tube tip lies just below the inferior margin of the clavicular heads and is at least 4 cm above the carina. The esophagogastric tube demonstrates its proximal port at or just below the GE junction. There is no pleural effusion or pneumothorax.  IMPRESSION: Progressive consolidation of the pulmonary interstitium especially in the right upper lobe is consistent with alveolar filling processes such as pneumonia or pulmonary edema though I favor pneumonia. Given the history of seizure activity aspiration may have occurred.   Electronically Signed   By: David  [place]   On: 12/05/2012 09:57   Dg Chest Portable 1 View  12/05/2012   CLINICAL DATA:  History of seizures. Intubated patient.  EXAM: PORTABLE CHEST - 1 VIEW  COMPARISON:  Chest x-ray 09/05/2012.  FINDINGS: An endotracheal tube  is in place with tip 4.1 cm above the carina. Nasogastric tube terminates in the mid esophagus Lung volumes are low. Patchy perihilar opacities bilaterally. Crowding of the pulmonary vasculature, without frank pulmonary edema. Heart size is normal. Mediastinal contours are slightly distorted by patient's positioning.  IMPRESSION: 1. Support apparatus, as above. Take note of the high position of  the nasogastric tube, which could be advanced at least 15-20 cm for more optimal placement. 2. Low lung volumes with patchy perihilar opacities. This is of uncertain etiology and significance, but given the patient's history of seizures, the possibility of aspiration is not excluded.   Electronically Signed   By: Trudie Reed M.D.   On: 12/05/2012 01:44   Dg Abd Portable 1v  12/05/2012   CLINICAL DATA:  OG tube placement.  EXAM: PORTABLE ABDOMEN - 1 VIEW  COMPARISON:  None.  FINDINGS: OG tube tip is in the fundus of the stomach. There are no dilated loops of bowel. No visible free air or free fluid. No osseous abnormality.  IMPRESSION: OG tube tip in the fundus of the stomach.   Electronically Signed   By: Geanie Cooley M.D.   On: 12/05/2012 07:40     CXR: Progressive consolidation.  Likely RUL PNA, possible aspiration.  ASSESSMENT / PLAN:  PULMONARY A: VDRF with aspiration post seizure. ARDS P:   - Full vent support - keep 8cc/kg & use ARDS protocol for PEEP /FIO2 titration. - Trend ABG's. - Adjust vent to ABG as indicated. - f/u CXR in AM.  CARDIOVASCULAR A: HTN by history, recent change in medications (lisinopril discontinued) - adm MAP was 120. Hypotension - presumed secondary to shock P:  - Add Levophed -aim for MAP 85 & above   RENAL A:  Metabolic acidosis post seizure. Hyperkalemia - presumed in the setting of acidemia. AKI - in the setting of shock. Mild hypocalcemia P:   - Rpt ABg - if acidosis worse ,consider Bicarb for Acidosis and Hyperkalemia - If K continues to increase, check  EKG - Cont NS @ 100 - Replace electrolytes as indicated. - f/u BMP in AM.  GASTROINTESTINAL A:  No active issues. P:   - Consider starting tube feeds since pt still intubated. - Protonix for GI prophylaxis.  HEMATOLOGIC A:  Leukocytosis, likely a stress response, no fever/chills at home.  Now aspirated pre intubation. P:  - Monitor WBC/fever curve.  INFECTIOUS A:  Meningitis very unlikely, no symptoms prior and neck is very supple. P:   - Pan culture. - Unasyn, add vanc for nosocomial aspiration (given recent hospitalisation). - Follow WBC and fever curve.  ENDOCRINE A:  DM by history.   P:   - Monitor CBGs. - SSI  NEUROLOGIC A:  Seizure and HTN, recent PRES admission, likely another episode since his anti-HTN medications have been recently changed. P:   - Keep MAP high - CT ok. - follow Neuro recs   Rutherford Guys, PA - S  Updated wife  Summary - ARDS from aspiration, seizures seem to have stopped, keep MAP high given hypertension, await MRI once stabilised for transport  Care during the described time interval was provided by me and/or other providers on the critical care team.  I have reviewed this patient's available data, including medical history, events of note, physical examination and test results as part of my evaluation   The patient is critically ill with multiple organ systems failure and requires high complexity decision making for assessment and support, frequent evaluation and titration of therapies, application of advanced monitoring technologies and extensive interpretation of multiple databases. Critical Care Time devoted to patient care services described in this note is 35 minutes.   Cyril Mourning MD. Tonny Bollman. [facility] Pager [phone] If no response call [phone]

## 2012-12-05 NOTE — Procedures (Signed)
Central Venous Catheter Insertion Procedure Note Juan Glenn [id number] December 05, 1955  Procedure: Insertion of Central Venous Catheter Indications: Assessment of intravascular volume, Drug and/or fluid administration and Frequent blood sampling  Procedure Details Consent: Risks of procedure as well as the alternatives and risks of each were explained to the (patient/caregiver).  Consent for procedure obtained. Time Out: Verified patient identification, verified procedure, site/side was marked, verified correct patient position, special equipment/implants available, medications/allergies/relevent history reviewed, required imaging and test results available.  Performed  Maximum sterile technique was used including antiseptics, cap, gloves, gown, hand hygiene, mask and sheet. Skin prep: Chlorhexidine; local anesthetic administered A antimicrobial bonded/coated triple lumen catheter was placed in the right internal jugular vein using the Seldinger technique. Ultrasound guidance used.yes Catheter placed to 17 cm. Blood aspirated via all 3 ports and then flushed x 3. Line sutured x 2 and dressing applied.  Evaluation Blood flow good Complications: No apparent complications Patient did tolerate procedure well. Chest X-ray ordered to verify placement.  CXR: pending. Performed by: Audrie Gallus Minor ACNP Adolph Pollack PCCM Pager [phone] till 3 pm If no answer page [phone]  Ultrasound used for site verification, live visualisation of needle entry & guidewire prior to dilation  I was present & supervised the procedure  ALVA,RAKESH V.  12/05/2012, 1:17 PM

## 2012-12-05 NOTE — ED Notes (Signed)
Pt back from CT

## 2012-12-05 NOTE — Care Management Note (Addendum)
    Page 1 of 2   12/18/2012     4:24:58 PM   CARE MANAGEMENT NOTE 12/18/2012  Patient:  Juan Glenn, Juan Glenn   Account Number:  [id number]  Date Initiated:  12/05/2012  Documentation initiated by:  Junius Creamer  Subjective/Objective Assessment:   adm w resp failure, at fib, vent     Action/Plan:   lives w wife, pcp dr Leonette Most record   Anticipated DC Date:  12/18/2012   Anticipated DC Plan:  HOME W HOME HEALTH SERVICES      DC Planning Services  CM consult      [facility] Choice  HOME HEALTH   Choice offered to / List presented to:  C-1 Patient   DME arranged  [facility]  [facility]      DME agency  [facility]     HH arranged  HH-1 RN  HH-2 PT  HH-3 OT      [facility] agency  [facility]   Status of service:  Completed, signed off Medicare Important Message given?   (If response is "NO", the following Medicare IM given date fields will be blank) Date Medicare IM given:   Date Additional Medicare IM given:    Discharge Disposition:  HOME W HOME HEALTH SERVICES  Per UR Regulation:  Reviewed for med. necessity/level of care/duration of stay  If discussed at Long Length of Stay Meetings, dates discussed:   12/10/2012  12/12/2012  12/17/2012    Comments:  12/18/12 Juan Kassa,RN,BSN [phone] PT FOR DC HOME TODAY.  NEEDS HH FOLLOW UP; MET WITH PT AND WIFE TO DISCUSS ARRANGEMENTS.  REFERRAL TO [facility], PER PT/WIFE CHOICE.  START OF CARE 24-48H POST DC DATE.  DME DELIVERED TO ROOM PRIOR TO DC BY [facility].  11/18 1116a Juan dowell rn,bsn spoke w pt and wife. pt would like hhpt and rw. phy ther has rec hhpt and rw. will leave md note for orders. no pref by pt as long as agency takes bcbs ins and goes to [place].

## 2012-12-05 NOTE — Progress Notes (Signed)
  Recent Labs Lab 12/05/12 0214 12/05/12 1054 12/05/12 1947  PHART 6.959* 7.218* 7.193*  PCO2ART 40.9 37.5 45.3*  PO2ART 257.0* 79.0* 114.0*  HCO3 9.5* 15.3* 17.2*  TCO2 11 16 19   O2SAT 100.0 93.0 97.0    PLAN - increase RR 18 -> 28 - Vt current at 8cc./kg/IBW - insert a line   - check abg in 1h  Dr. Kalman Shan, M.D., [facility].C.P Pulmonary and Critical Care Medicine Staff Physician [facility] [place] [facility] Pager: [phone], If no answer or between  15:00h - 7:00h: call [phone]  12/05/2012 8:04 PM

## 2012-12-05 NOTE — Progress Notes (Signed)
Patient transported on vent to room 2H08 without incident.

## 2012-12-05 NOTE — ED Notes (Signed)
Pt placed on bear hugger.  

## 2012-12-05 NOTE — Progress Notes (Signed)
INITIAL NUTRITION ASSESSMENT  DOCUMENTATION CODES Per approved criteria  -Obesity Unspecified   INTERVENTION: 1.  Enteral nutrition; initiate Vital High Protein @ 20 mL/hr continuous.  Advance by 10 mL q 4 hrs to 70 mL/hr goal to provide 1680 kcal, 147g protein, 1404 mL free water.  NUTRITION DIAGNOSIS: Inadequate oral intake related to inability to eat as evidenced by NPO, vent.   Monitor:  1.  Enteral nutrition; initiation with tolerance.  Enteral nutrition to provide 60-70% of estimated calorie needs (22-25 kcals/kg ideal body weight) and 100% of estimated protein needs, based on ASPEN guidelines for permissive underfeeding in critically ill obese individuals. 2.  Wt/wt change; monitor trends  Reason for Assessment: Vent  57 y.o. male  Admitting Dx: Acute respiratory failure  ASSESSMENT: Pt admitted with acute respiratory failure. Pt with h/o PRES syndrome 2 months ago. Pt with vomiting and aspiration, now intubated.  Per RT, pt with vomiting this AM.  Patient is currently intubated on ventilator support.  MV: 14.2 L/min Temp:Temp (24hrs), Avg:96.7 F (35.9 C), Min:95.2 F (35.1 C), Max:101.4 F (38.6 C)  Propofol: 5.6 ml/hr providing 147 kcal/day.  Nutrition Focused Physical Exam: Subcutaneous Fat:  Orbital Region: WNL Upper Arm Region: WNL Thoracic and Lumbar Region: WNL  Muscle:  Temple Region: WNL Clavicle Bone Region: WNL Clavicle and Acromion Bone Region: WNL Scapular Bone Region: WNL Dorsal Hand: WNL Patellar Region: WNL Anterior Thigh Region: WNL Posterior Calf Region: WNL  Edema: none present  Height: Ht Readings from Last 1 Encounters:  12/05/12 5\' 9"  (1.753 m)    Weight: Wt Readings from Last 1 Encounters:  12/05/12 211 lb 6.7 oz (95.9 kg)    Ideal Body Weight: 72.7  % Ideal Body Weight: 131%  Wt Readings from Last 10 Encounters:  12/05/12 211 lb 6.7 oz (95.9 kg)  10/24/12 209 lb (94.802 kg)  10/03/12 208 lb (94.348 kg)  09/06/12  214 lb 9.6 oz (97.342 kg)   Usual Body Weight: 208-214 lbs  % Usual Body Weight: 100%  BMI:  Body mass index is 31.21 kg/(m^2).  Estimated Nutritional Needs: Kcal: 2378 Permissive underfeeding:  1610-9604 kcal/d Protein: 144g Fluid: ~2.0 L/day  Skin: intact  Diet Order: NPO  EDUCATION NEEDS: -Education not appropriate at this time   Intake/Output Summary (Last 24 hours) at 12/05/12 1324 Last data filed at 12/05/12 1300  Gross per 24 hour  Intake 2743.67 ml  Output   1900 ml  Net 843.67 ml    Last BM: PTA  Labs:   Recent Labs Lab 12/05/12 0020 12/05/12 0545 12/05/12 0715  NA 145 138 140  K 4.8 5.7* 5.5*  CL 97 104 106  CO2 8* 13* 14*  BUN 13 16 18   CREATININE 1.31 1.20 1.40*  CALCIUM 11.2* 8.4 8.2*  MG 3.0* 2.6*  --   PHOS  --  4.0  --   GLUCOSE 293* 394* 322*    CBG (last 3)   Recent Labs  12/05/12 0449  GLUCAP 317*    Scheduled Meds: . ampicillin-sulbactam (UNASYN) IV  3 g Intravenous Q6H  . antiseptic oral rinse  15 mL Mouth Rinse QID  . chlorhexidine  15 mL Mouth Rinse BID  . fentaNYL  50 mcg Intravenous Once  . pantoprazole (PROTONIX) IV  40 mg Intravenous Q24H  . [START ON 12/06/2012] pneumococcal 23 valent vaccine  0.5 mL Intramuscular Tomorrow-1000    Continuous Infusions: . sodium chloride 100 mL/hr at 12/05/12 0500  . sodium chloride    .  fentaNYL infusion INTRAVENOUS 25 mcg/hr (12/05/12 0404)  . fentaNYL infusion INTRAVENOUS 100 mcg/hr (12/05/12 0504)  . insulin (NOVOLIN-R) infusion 2.4 Units/hr (12/05/12 0819)  . norepinephrine (LEVOPHED) Adult infusion 25 mcg/min (12/05/12 1300)  . propofol 25 mcg/kg/min (12/05/12 1300)    Past Medical History  Diagnosis Date  . Diabetes mellitus without complication 2009  . Renal disorder   . Hyperlipidemia   . Difficult intubation     Past Surgical History  Procedure Laterality Date  . No surgical history  09/2012    Loyce Dys, MS RD LDN Clinical Inpatient Dietitian Pager:  [phone] Weekend/After hours pager: [phone]

## 2012-12-05 NOTE — Progress Notes (Signed)
ANTIBIOTIC CONSULT NOTE - INITIAL  Pharmacy Consult for Unasyn Indication: Aspiration PNA  No Known Allergies  Patient Measurements: Weight: 208 lb 15.9 oz (94.8 kg) Adjusted Body Weight:    Vital Signs: Temp: 95.9 F (35.5 C) (11/06 0250) Temp src: Core (Comment) (11/06 0240) BP: 129/61 mmHg (11/06 0250) Pulse Rate: 83 (11/06 0250) Intake/Output from previous day: 11/05 0701 - 11/06 0700 In: 1280 [I.V.:1280] Out: -  Intake/Output from this shift: Total I/O In: 1280 [I.V.:1280] Out: -   Labs:  Recent Labs  12/05/12 0020  WBC 30.8*  HGB 17.3*  PLT 231  CREATININE 1.31   The CrCl is unknown because both a height and weight (above a minimum accepted value) are required for this calculation. No results found for this basename: VANCOTROUGH, VANCOPEAK, VANCORANDOM, GENTTROUGH, GENTPEAK, GENTRANDOM, TOBRATROUGH, TOBRAPEAK, TOBRARND, AMIKACINPEAK, AMIKACINTROU, AMIKACIN,  in the last 72 hours   Microbiology: No results found for this or any previous visit (from the past 720 hour(s)).  Medical History: Past Medical History  Diagnosis Date  . Diabetes mellitus without complication 2009  . Renal disorder   . Hyperlipidemia     Medications: med rec pending  Assessment: Seizure and HTN 57 year old male with history of PRES syndrome 2 months ago presentes to the hospital with HTN and AMS. Patient was given 2 mg of IV ativan, seizure stopped but then vomited and aspirated. Intubated in ED. CXR with B pulmonary infiltrates. PMH: DM, renal disorder, HLD, HTN  Scr 1.31, WBC 30.8, estimated CrCl 82   Goal of Therapy:  Coverage for aspiration PNA  Plan:  Unasyn 3g IV q6hrs.   Juan Glenn, PharmD, BCPS Clinical Staff Pharmacist Pager [phone]  Misty Stanley Stillinger 12/05/2012,3:12 AM

## 2012-12-05 NOTE — ED Notes (Addendum)
Pt vomited and is unable to maintain airway. MD notified

## 2012-12-05 NOTE — ED Notes (Signed)
Pt arrived from home via [facility] c/o Altered mental status and seizure. EMS BP 182/104, o2 100% NRB, CBG 112, 12 lead WNL. Last seen normal 2300. 2.5 versed administered by EMS prior to arrival

## 2012-12-05 NOTE — Progress Notes (Addendum)
Multiple attempts made by RT for arterial line insertion.  RT unavailable to thread catheter.  MD notified.

## 2012-12-05 NOTE — ED Notes (Signed)
MD at bedside. 

## 2012-12-05 NOTE — Progress Notes (Signed)
Patient was actively vomiting up through the ET tube and each time the patient was suctioned RT was only able to  obtain remnants of vomit. Spoke with Dr. Delton Coombes about the order to obtain a sputum culture from patient while this was going on and he stated to just wait until later in the day to obtain the culture.

## 2012-12-05 NOTE — Progress Notes (Signed)
ABG resulted in critical value. PH 6.95. RBV to Raeford Razor, MD at [phone].

## 2012-12-05 NOTE — ED Notes (Signed)
Assumed care of patient. Intubated. Cardiac monitoring. No seizure activity at this time. Lab at bedside.  Order send to pharmacy for Fentanyl.  Waiting on hospital bed.

## 2012-12-05 NOTE — ED Provider Notes (Signed)
CSN: [id number]     Arrival date & time 12/05/12  0006 History   First MD Initiated Contact with Patient 12/05/12 0008     Chief Complaint  Patient presents with  . Seizures   (Consider location/radiation/quality/duration/timing/severity/associated sxs/prior Treatment) HPI  57 year old male brought in by EMS. Patient was at home paying in bed next to his wife when she noticed him with generalized shaking. His eyes were open, but he was not responding to her. This activity abated before EMS arrived but he remained very confused. Per EMS, he was moving all extremities and localizing painful stimuli. Generalized seizure in route which abated with 2.5 mg of Versed. Glucose prehospital was 112. Patient with another generalized seizure upon arriving to the emergency room. Wife reports that patient had no complaints earlier in the day. No trauma that she is aware of. No concern for ingestion. Patient with recent admission for presumed seizure approximately 2 months ago. At that time he is found unresponsive in the restroom at work.His work-up included EEG, MRI brain wwo contrast, CTA, echo, and laboratory resting. EEG was normal and MRI brain which showed abnormal patchy areas of T2 signal intensity involving bilateral temporal lobes, occipital lobes, parietal lobes, and left frontal lobe. Some of those areas demonstrated mild enhancement. There was minimal petechial hemorrhage in the left frontal lobe and right parietal lobe. His CTA showed mild intracranial atherosclerosis. No evidence for embolic source on echocardiogram. Patient remained clinically stable and was discharged with out-patient follow-up with neurology. 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 Was on lisinopril, but this has been discontinued. 81mg  aspirin daily.   Past Medical History  Diagnosis Date  . Diabetes mellitus without complication 2009  . Renal disorder   . Hyperlipidemia    Past Surgical  History  Procedure Laterality Date  . No surgical history  09/2012   Family History  Problem Relation Age of Onset  . Diabetes Mother   . Congestive Heart Failure Mother     Died, 22s  . Heart attack Father     Died, 56  . Diabetes Brother   . Hypertension Sister   . Cancer Brother     Died, 28   History  Substance Use Topics  . Smoking status: Never Smoker   . Smokeless tobacco: Never Used  . Alcohol Use: No    Review of Systems  Level 5 caveat because pt is unresponsive.   Allergies  Review of patient's allergies indicates no known allergies.  Home Medications   Current Outpatient Rx  Name  Route  Sig  Dispense  Refill  . aspirin EC 81 MG tablet   Oral   Take 1 tablet (81 mg total) by mouth daily.         Marland Kitchen glimepiride (AMARYL) 2 MG tablet      2 mg. Take 1 tablet (2 mg total) by mouth 2 (two) times daily before meals.         . Linagliptin-Metformin HCl (JENTADUETO) 2.05-998 MG TABS      1 tablet. Take 1 tablet by mouth 2 (two) times daily.         . Loratadine (CLARITIN PO)   Oral   Take 1 tablet by mouth daily as needed (allergies).         . rosuvastatin (CRESTOR) 10 MG tablet   Oral   Take 10 mg by mouth daily.          There were no vitals taken for this visit.  Physical Exam  Nursing note and vitals reviewed. Constitutional: He appears well-developed and well-nourished. He appears distressed.  HENT:  Head: Normocephalic and atraumatic.  L nasal airway. Small cut to underside of tongue.   Eyes: Conjunctivae are normal. Right eye exhibits no discharge. Left eye exhibits no discharge.  Pupils ~101mm and symmetric. Disconjugate gaze.   Neck: Neck supple.  Cardiovascular: Normal heart sounds.  Exam reveals no gallop and no friction rub.   No murmur heard. Tachycardic. Irregularly irregular rhythm.   Pulmonary/Chest:  Tachypneic. Lungs clear.   Abdominal: Soft. He exhibits no distension. There is no tenderness.  Genitourinary:   Incontinent of urine  Musculoskeletal: He exhibits no edema and no tenderness.  Neurological:  Unresponsive to voice/painful stimuli. Generalized tonic-clonic movements  Skin: Skin is warm. He is diaphoretic.  Psychiatric: He has a normal mood and affect. His behavior is normal. Thought content normal.    ED Course  NG placement Date/Time: 12/05/2012 1:20 AM Performed by: Raeford Razor Authorized by: Raeford Razor Consent: The procedure was performed in an emergent situation. Patient identity confirmed: arm band and provided demographic data Patient sedated: yes Vitals: Vital signs were monitored during sedation. Patient tolerance: Patient tolerated the procedure well with no immediate complications. Comments: OG tube. Confirmed with auscultation over epigastrium with air bolus and then aspiration of gastric contents when placed to suction.    (including critical care time)  INTUBATION Performed by: Raeford Razor  Required items: required blood products, implants, devices, and special equipment available Patient identity confirmed: provided demographic data and hospital-assigned identification number Time out: Immediately prior to procedure a "time out" was called to verify the correct patient, procedure, equipment, support staff and site/side marked as required.  Indications: decreased mental status and vomiting. Intubated for airway protection.   Intubation method: Glidescope Laryngoscopy   Preoxygenation: BVM  Sedatives: Etomidate Paralytic: Succinylcholine  Tube Size: 7.5 cuffed  23cm at lips  Post-procedure assessment: chest rise and ETCO2 monitor Breath sounds: equal and absent over the epigastrium Tube secured with: ETT holder Chest x-ray interpreted by radiologist and me.  Chest x-ray findings: endotracheal tube in appropriate position  Patient tolerated the procedure well with no immediate complications.  CRITICAL CARE Performed by: Raeford Razor  Total critical care time: 60 minutes  Critical care time was exclusive of separately billable procedures and treating other patients. Critical care was necessary to treat or prevent imminent or life-threatening deterioration. Critical care was time spent personally by me on the following activities: development of treatment plan with patient and/or surrogate as well as nursing, discussions with consultants, evaluation of patient's response to treatment, examination of patient, obtaining history from patient or surrogate, ordering and performing treatments and interventions, ordering and review of laboratory studies, ordering and review of radiographic studies, pulse oximetry and re-evaluation of patient's condition.     Labs Review Labs Reviewed  CBC WITH DIFFERENTIAL - Abnormal; Notable for the following:    WBC 30.8 (*)    Hemoglobin 17.3 (*)    Neutrophils Relative % 37 (*)    Lymphocytes Relative 52 (*)    Neutro Abs 11.4 (*)    Lymphs Abs 16.0 (*)    Monocytes Absolute 2.8 (*)    All other components within normal limits  URINALYSIS, ROUTINE W REFLEX MICROSCOPIC - Abnormal; Notable for the following:    APPearance CLOUDY (*)    Glucose, UA 250 (*)    Hgb urine dipstick MODERATE (*)    Protein, ur 100 (*)  All other components within normal limits  MAGNESIUM - Abnormal; Notable for the following:    Magnesium 3.0 (*)    All other components within normal limits  COMPREHENSIVE METABOLIC PANEL - Abnormal; Notable for the following:    CO2 8 (*)    Glucose, Bld 293 (*)    Calcium 11.2 (*)    Total Protein 8.6 (*)    AST 40 (*)    GFR calc non Af Amer 59 (*)    GFR calc Af Amer 68 (*)    All other components within normal limits  POCT I-STAT 3, BLOOD GAS (G3+) - Abnormal; Notable for the following:    pH, Arterial 6.959 (*)    pO2, Arterial 257.0 (*)    Bicarbonate 9.5 (*)    Acid-base deficit 23.0 (*)    All other components within normal limits  CG4 I-STAT  (LACTIC ACID) - Abnormal; Notable for the following:    Lactic Acid, Venous 16.72 (*)    All other components within normal limits  URINE CULTURE  URINE RAPID DRUG SCREEN (HOSP PERFORMED)  CK  PROTIME-INR  APTT  URINE MICROSCOPIC-ADD ON  TROPONIN I  TSH  TYPE AND SCREEN  ABO/RH   Imaging Review Ct Head Wo Contrast  12/05/2012   CLINICAL DATA:  56-year- male with seizure. Altered mental status. Last seen normal 2300 hrs. Initial encounter.  EXAM: CT HEAD WITHOUT CONTRAST  TECHNIQUE: Contiguous axial images were obtained from the base of the skull through the vertex without intravenous contrast.  COMPARISON:  Brain MRI 10/09/2012 and earlier.  FINDINGS: Left side nasal airway in place. Improved sphenoid sinus aeration. Other Visualized paranasal sinuses and mastoids are clear.  Dysconjugate gaze. Negative scalp soft tissues. No acute osseous abnormality identified.  Calcified atherosclerosis at the skull base. Stable and normal cerebral volume. No ventriculomegaly. Small chronic focus of encephalomalacia in the undersurface of the right occipital lobe appears to be stable. Elsewhere gray-white matter differentiation is stable and within normal limits. No midline shift, mass effect, or evidence of intracranial mass lesion. No acute intracranial hemorrhage identified. No evidence of cortically based acute infarction identified. No suspicious intracranial vascular hyperdensity.  IMPRESSION: No acute intracranial abnormality. Stable non contrast CT appearance of the brain.   Electronically Signed   By: Augusto Gamble M.D.   On: 12/05/2012 00:45   Dg Chest Portable 1 View  12/05/2012   CLINICAL DATA:  History of seizures. Intubated patient.  EXAM: PORTABLE CHEST - 1 VIEW  COMPARISON:  Chest x-ray 09/05/2012.  FINDINGS: An endotracheal tube is in place with tip 4.1 cm above the carina. Nasogastric tube terminates in the mid esophagus Lung volumes are low. Patchy perihilar opacities bilaterally. Crowding of the  pulmonary vasculature, without frank pulmonary edema. Heart size is normal. Mediastinal contours are slightly distorted by patient's positioning.  IMPRESSION: 1. Support apparatus, as above. Take note of the high position of the nasogastric tube, which could be advanced at least 15-20 cm for more optimal placement. 2. Low lung volumes with patchy perihilar opacities. This is of uncertain etiology and significance, but given the patient's history of seizures, the possibility of aspiration is not excluded.   Electronically Signed   By: Trudie Reed M.D.   On: 12/05/2012 01:44    EKG Interpretation     Ventricular Rate:  162 PR Interval:    QRS Duration: 98 QT Interval:  311 QTC Calculation: 511 R Axis:   79 Text Interpretation:  Atrial fibrillation with rapid  V-rate PVC ST depression, probably rate related            MDM   1. Status epilepticus   2. Atrial fibrillation with rapid ventricular response   3. Acute respiratory failure     57 year old male with seizure. Seizing upon arrival to the emergency room. At least third generalized seizure without return to baseline and had already received 2.5mg  of versed by EMS. Additional 2 mg of Ativan given. Fosphenytoin ordered. No external signs of trauma. Head CT. Labs. Also noted to be in afib with RVR which appears to be new diagnosis.   Pt return from CT. No further events. Appears to be post-ictal and with snoring respirations. Remains in afib with HR 120-150. Hypertensive. Cardizem ordered. Fosphenytoin substituted with phenytoin per pharmacy because of availability issues. Head CT w/ no acute abnormalities per my review.   Pt now vomiting. Intubated for airway protection. Rhythm now sinus shortly after cardizem bolus.   Cardizem stopped. BP in 140-150/70-80 range and propofol now started. Remains in sinus. OG dislodged and will be repositioned. Discussed with CCM and neurology. Wife and several sisters updated on his status and  current plan.   Raeford Razor, MD 12/05/12 [phone]

## 2012-12-05 NOTE — Progress Notes (Signed)
eLink Physician-Brief Progress Note Patient Name: Juan Glenn DOB: Mar 20, 1955 MRN: [id number]  Date of Service  12/05/2012   HPI/Events of Note  Relative shock, HTN on admission Levo 35   eICU Interventions  Increase levo, add vaso cvp low, bolus   Intervention Category Major Interventions: Hypotension - evaluation and management  FEINSTEIN,DANIEL J. 12/05/2012, 3:28 PM

## 2012-12-05 NOTE — H&P (Addendum)
PULMONARY  / CRITICAL CARE MEDICINE  Name: Juan Glenn MRN: [id number] DOB: 1955/11/09    ADMISSION DATE:  12/05/2012 CONSULTATION DATE:  11/36/2014  REFERRING MD :  EDP PRIMARY SERVICE: PCCM  CHIEF COMPLAINT:  Seizure and HTN  BRIEF PATIENT DESCRIPTION: 57 year old male with history of PRES syndrome 2 months ago presentes to the hospital with HTN and AMS.  Patient was given 2 mg of IV ativan, seizure stopped but then vomited and aspirated.  EDP intubated.  PCCM to admit and neuro to see.  SIGNIFICANT EVENTS / STUDIES:  11/4 intubated, seizure and HTN.  LINES / TUBES: ETT 11/4>>> PIV  CULTURES: Blood 11/4>> Urine 11/4>> Sputum 11/4>>  ANTIBIOTICS: Unasyn 11/4>>  PAST MEDICAL HISTORY :  Past Medical History  Diagnosis Date  . Diabetes mellitus without complication 2009  . Renal disorder   . Hyperlipidemia    Past Surgical History  Procedure Laterality Date  . No surgical history  09/2012   Prior to Admission medications   Medication Sig Start Date End Date Taking? Authorizing Provider  aspirin EC 81 MG tablet Take 1 tablet (81 mg total) by mouth daily. 09/07/12  Yes Catarina Hartshorn, MD  glimepiride (AMARYL) 2 MG tablet Take 2 mg by mouth 2 (two) times daily before a meal. Take 1 tablet (2 mg total) by mouth 2 (two) times daily before meals. 07/15/12  Yes Historical Provider, MD  Linagliptin-Metformin HCl (JENTADUETO) 2.05-998 MG TABS 1 tablet. Take 1 tablet by mouth 2 (two) times daily. 07/15/12  Yes Historical Provider, MD  Multiple Vitamin (MULTIVITAMIN WITH MINERALS) TABS tablet Take 1 tablet by mouth daily.   Yes Historical Provider, MD  rosuvastatin (CRESTOR) 10 MG tablet Take 10 mg by mouth daily.   Yes Historical Provider, MD  Loratadine (CLARITIN PO) Take 1 tablet by mouth daily as needed (allergies).    Historical Provider, MD   No Known Allergies  FAMILY HISTORY:  Family History  Problem Relation Age of Onset  . Diabetes Mother   . Congestive Heart  Failure Mother     Died, 61s  . Heart attack Father     Died, 24  . Diabetes Brother   . Hypertension Sister   . Cancer Brother     Died, 108   SOCIAL HISTORY:  reports that he has never smoked. He has never used smokeless tobacco. He reports that he does not drink alcohol or use illicit drugs.  REVIEW OF SYSTEMS:  Unattainable, patient is sedated and intubated.  SUBJECTIVE:   VITAL SIGNS: Temp:  [95.2 F (35.1 C)-97.3 F (36.3 C)] 95.9 F (35.5 C) (11/06 0250) Pulse Rate:  [83-147] 83 (11/06 0250) Resp:  [16-38] 24 (11/06 0250) BP: (124-178)/(60-114) 129/61 mmHg (11/06 0250) SpO2:  [96 %-100 %] 100 % (11/06 0250) FiO2 (%):  [100 %] 100 % (11/06 0130) Weight:  [94.8 kg (208 lb 15.9 oz)] 94.8 kg (208 lb 15.9 oz) (11/06 0100) HEMODYNAMICS:   VENTILATOR SETTINGS: Vent Mode:  [-] PRVC FiO2 (%):  [100 %] 100 % Set Rate:  [18 bmp] 18 bmp Vt Set:  [600 mL] 600 mL PEEP:  [5 cmH20] 5 cmH20 Plateau Pressure:  [18 cmH20] 18 cmH20 INTAKE / OUTPUT: Intake/Output     11/05 0701 - 11/06 0700   I.V. (mL/kg) 280 (3)   Total Intake(mL/kg) 280 (3)   Net +280         PHYSICAL EXAMINATION: General:  Chronically ill appearing male. Neuro:  Sedated, intubated and paralyzed. HEENT:  [place]/AT, pupils pinpoint, paralyzed. Cardiovascular:  RRR, Nl S1/S2, -M/R/G. Lungs:  Coarse BS diffusely. Abdomen:  Soft, NT, ND and +BS. Musculoskeletal:  -edema and -tenderness. Skin:  Intact.  LABS:  CBC Recent Labs     12/05/12  0020  WBC  30.8*  HGB  17.3*  HCT  50.5  PLT  231   Coag's Recent Labs     12/05/12  0020  APTT  30  INR  1.21   BMET Recent Labs     12/05/12  0020  NA  145  K  4.8  CL  97  CO2  8*  BUN  13  CREATININE  1.31  GLUCOSE  293*   Electrolytes Recent Labs     12/05/12  0020  CALCIUM  11.2*  MG  3.0*   Sepsis Markers No results found for this basename: LACTICACIDVEN, PROCALCITON, O2SATVEN,  in the last 72 hours ABG Recent Labs     12/05/12  0214   PHART  6.959*  PCO2ART  40.9  PO2ART  257.0*   Liver Enzymes Recent Labs     12/05/12  0020  AST  40*  ALT  37  ALKPHOS  113  BILITOT  0.3  ALBUMIN  4.5   Cardiac Enzymes No results found for this basename: TROPONINI, PROBNP,  in the last 72 hours Glucose No results found for this basename: GLUCAP,  in the last 72 hours  Imaging Ct Head Wo Contrast  12/05/2012   CLINICAL DATA:  56-year- male with seizure. Altered mental status. Last seen normal 2300 hrs. Initial encounter.  EXAM: CT HEAD WITHOUT CONTRAST  TECHNIQUE: Contiguous axial images were obtained from the base of the skull through the vertex without intravenous contrast.  COMPARISON:  Brain MRI 10/09/2012 and earlier.  FINDINGS: Left side nasal airway in place. Improved sphenoid sinus aeration. Other Visualized paranasal sinuses and mastoids are clear.  Dysconjugate gaze. Negative scalp soft tissues. No acute osseous abnormality identified.  Calcified atherosclerosis at the skull base. Stable and normal cerebral volume. No ventriculomegaly. Small chronic focus of encephalomalacia in the undersurface of the right occipital lobe appears to be stable. Elsewhere gray-white matter differentiation is stable and within normal limits. No midline shift, mass effect, or evidence of intracranial mass lesion. No acute intracranial hemorrhage identified. No evidence of cortically based acute infarction identified. No suspicious intracranial vascular hyperdensity.  IMPRESSION: No acute intracranial abnormality. Stable non contrast CT appearance of the brain.   Electronically Signed   By: Augusto Gamble M.D.   On: 12/05/2012 00:45   Dg Chest Portable 1 View  12/05/2012   CLINICAL DATA:  History of seizures. Intubated patient.  EXAM: PORTABLE CHEST - 1 VIEW  COMPARISON:  Chest x-ray 09/05/2012.  FINDINGS: An endotracheal tube is in place with tip 4.1 cm above the carina. Nasogastric tube terminates in the mid esophagus Lung volumes are low. Patchy  perihilar opacities bilaterally. Crowding of the pulmonary vasculature, without frank pulmonary edema. Heart size is normal. Mediastinal contours are slightly distorted by patient's positioning.  IMPRESSION: 1. Support apparatus, as above. Take note of the high position of the nasogastric tube, which could be advanced at least 15-20 cm for more optimal placement. 2. Low lung volumes with patchy perihilar opacities. This is of uncertain etiology and significance, but given the patient's history of seizures, the possibility of aspiration is not excluded.   Electronically Signed   By: Trudie Reed M.D.   On: 12/05/2012 01:44  CXR: Bilateral pulmonary infiltrate.  ASSESSMENT / PLAN:  PULMONARY A: VDRF with aspiration post seizure. P:   - Full vent support. - Repeat ABG now. - CXR and ABG in AM. - Adjust vent to ABG as indicated.  CARDIOVASCULAR A: HTN by history, recent change in medications (lisinopril discontinued). P:  - Propofol for now with target SBP per neuro recommendations. - If remains HTN after propofol will consider adding metoprolol.  RENAL A:  Metabolic acidosis post seizure. P:   - Repeat BMET in AM. - Replace electrolytes as indicated.  GASTROINTESTINAL A:  No active issues. P:   - If remains intubated in AM will need TF. - Protonix for GI prophylaxis.  HEMATOLOGIC A:  Leukocytosis, likely a stress response, no fever/chills at home.  Now aspirated pre intubation. P:  - CBC in AM. - See ID section.  INFECTIOUS A:  Meningitis very unlikely, no symptoms prior and neck is very supple. P:   - Pan culture. - Unasyn. - Follow WBC and fever curve.  ENDOCRINE A:  DM by history.   P:   - Monitor CBGs. - ISS.  NEUROLOGIC A:  Seizure and HTN, recent PRES admission, likely another episode since his anti-HTN medications have been recently changed. P:   - Control BP. - CT ok. - Neuro to address.  I have personally obtained a history, examined the  patient, evaluated laboratory and imaging results, formulated the assessment and plan and placed orders.  CRITICAL CARE: The patient is critically ill with multiple organ systems failure and requires high complexity decision making for assessment and support, frequent evaluation and titration of therapies, application of advanced monitoring technologies and extensive interpretation of multiple databases. Critical Care Time devoted to patient care services described in this note is 45 minutes.   Alyson Reedy, M.D. Pulmonary and Critical Care Medicine [facility] Pager: [phone]  12/05/2012, 2:54 AM

## 2012-12-05 NOTE — ED Notes (Signed)
Warm blankets placed on pt. 

## 2012-12-06 ENCOUNTER — Inpatient Hospital Stay (HOSPITAL_COMMUNITY): Payer: BC Managed Care – PPO

## 2012-12-06 LAB — POCT I-STAT 3, ART BLOOD GAS (G3+)
Acid-base deficit: 9 mmol/L — ABNORMAL HIGH (ref 0.0–2.0)
Bicarbonate: 16.1 mEq/L — ABNORMAL LOW (ref 20.0–24.0)
O2 Saturation: 99 %
Patient temperature: 100.9
pH, Arterial: 7.278 — ABNORMAL LOW (ref 7.350–7.450)

## 2012-12-06 LAB — PHOSPHORUS: Phosphorus: 1.7 mg/dL — ABNORMAL LOW (ref 2.3–4.6)

## 2012-12-06 LAB — PROCALCITONIN: Procalcitonin: 59.36 ng/mL

## 2012-12-06 LAB — GLUCOSE, CAPILLARY
Glucose-Capillary: 167 mg/dL — ABNORMAL HIGH (ref 70–99)
Glucose-Capillary: 200 mg/dL — ABNORMAL HIGH (ref 70–99)
Glucose-Capillary: 216 mg/dL — ABNORMAL HIGH (ref 70–99)
Glucose-Capillary: 205 mg/dL — ABNORMAL HIGH (ref 70–99)
Glucose-Capillary: 217 mg/dL — ABNORMAL HIGH (ref 70–99)

## 2012-12-06 LAB — BLOOD GAS, ARTERIAL
Acid-base deficit: 8.8 mmol/L — ABNORMAL HIGH (ref 0.0–2.0)
Drawn by: 34779
FIO2: 60 %
O2 Saturation: 98.7 %
PEEP: 10 cmH2O
RATE: 28 resp/min
pO2, Arterial: 126 mmHg — ABNORMAL HIGH (ref 80.0–100.0)

## 2012-12-06 LAB — CBC
Hemoglobin: 10.8 g/dL — ABNORMAL LOW (ref 13.0–17.0)
MCHC: 35.1 g/dL (ref 30.0–36.0)
MCV: 86 fL (ref 78.0–100.0)
RBC: 3.58 MIL/uL — ABNORMAL LOW (ref 4.22–5.81)
WBC: 13.4 10*3/uL — ABNORMAL HIGH (ref 4.0–10.5)

## 2012-12-06 LAB — URINE CULTURE
Colony Count: NO GROWTH
Colony Count: NO GROWTH
Culture: NO GROWTH
Culture: NO GROWTH
Special Requests: NORMAL

## 2012-12-06 LAB — KETONES, QUALITATIVE

## 2012-12-06 LAB — PHENYTOIN LEVEL, TOTAL: Phenytoin Lvl: 6.7 ug/mL — ABNORMAL LOW (ref 10.0–20.0)

## 2012-12-06 LAB — BASIC METABOLIC PANEL
BUN: 14 mg/dL (ref 6–23)
CO2: 13 mEq/L — ABNORMAL LOW (ref 19–32)
GFR calc Af Amer: 80 mL/min — ABNORMAL LOW (ref >90)
GFR calc non Af Amer: 69 mL/min — ABNORMAL LOW (ref >90)
Potassium: 3.4 mEq/L — ABNORMAL LOW (ref 3.5–5.1)
Sodium: 157 mEq/L — ABNORMAL HIGH (ref 135–145)

## 2012-12-06 LAB — MAGNESIUM: Magnesium: 1.3 mg/dL — ABNORMAL LOW (ref 1.5–2.5)

## 2012-12-06 LAB — LACTIC ACID, PLASMA
Lactic Acid, Venous: 2.8 mmol/L — ABNORMAL HIGH (ref 0.5–2.2)
Lactic Acid, Venous: 3 mmol/L — ABNORMAL HIGH (ref 0.5–2.2)
Lactic Acid, Venous: 3.3 mmol/L — ABNORMAL HIGH (ref 0.5–2.2)

## 2012-12-06 LAB — ALBUMIN: Albumin: 2.5 g/dL — ABNORMAL LOW (ref 3.5–5.2)

## 2012-12-06 MED ORDER — ACETAMINOPHEN 160 MG/5ML PO SOLN
650.0000 mg | ORAL | Status: DC | PRN
  Administered 2012-12-06 – 2012-12-12 (×17): 650 mg
  Filled 2012-12-06 (×17): qty 20.3

## 2012-12-06 MED ORDER — SODIUM CHLORIDE 0.9 % IV SOLN
1.0000 g | INTRAVENOUS | Status: AC
  Administered 2012-12-06 (×2): 1 g via INTRAVENOUS
  Filled 2012-12-06 (×2): qty 10

## 2012-12-06 MED ORDER — SODIUM CHLORIDE 0.45 % IV SOLN
INTRAVENOUS | Status: DC
  Administered 2012-12-06 – 2012-12-09 (×3): via INTRAVENOUS

## 2012-12-06 MED ORDER — VITAL HIGH PROTEIN PO LIQD
1000.0000 mL | ORAL | Status: DC
  Administered 2012-12-06 – 2012-12-14 (×10): 1000 mL
  Filled 2012-12-06 (×19): qty 1000

## 2012-12-06 MED ORDER — HEPARIN SODIUM (PORCINE) 5000 UNIT/ML IJ SOLN
5000.0000 [IU] | Freq: Three times a day (TID) | INTRAMUSCULAR | Status: DC
  Administered 2012-12-06 – 2012-12-18 (×36): 5000 [IU] via SUBCUTANEOUS
  Filled 2012-12-06 (×40): qty 1

## 2012-12-06 MED ORDER — SODIUM PHOSPHATE 3 MMOLE/ML IV SOLN
10.0000 mmol | Freq: Once | INTRAVENOUS | Status: AC
  Administered 2012-12-06: 10 mmol via INTRAVENOUS
  Filled 2012-12-06: qty 3.33

## 2012-12-06 MED ORDER — PHENYTOIN SODIUM 50 MG/ML IJ SOLN
100.0000 mg | Freq: Three times a day (TID) | INTRAMUSCULAR | Status: DC
  Administered 2012-12-06 – 2012-12-14 (×24): 100 mg via INTRAVENOUS
  Filled 2012-12-06 (×27): qty 2

## 2012-12-06 MED ORDER — SODIUM CHLORIDE 0.9 % IV SOLN
INTRAVENOUS | Status: DC
  Administered 2012-12-06: 1.5 [IU]/h via INTRAVENOUS
  Administered 2012-12-06: 4.9 [IU]/h via INTRAVENOUS
  Administered 2012-12-07: 4.4 [IU]/h via INTRAVENOUS
  Administered 2012-12-08: 2.8 [IU]/h via INTRAVENOUS
  Administered 2012-12-10: 7.8 [IU]/h via INTRAVENOUS
  Administered 2012-12-10: 8.1 [IU]/h via INTRAVENOUS
  Administered 2012-12-11 (×2): via INTRAVENOUS
  Administered 2012-12-12: 6.5 [IU]/h via INTRAVENOUS
  Administered 2012-12-13: 17.6 [IU]/h via INTRAVENOUS
  Administered 2012-12-13: 8.1 [IU]/h via INTRAVENOUS
  Administered 2012-12-13: 5.2 [IU]/h via INTRAVENOUS
  Filled 2012-12-06 (×11): qty 1

## 2012-12-06 MED ORDER — SODIUM CHLORIDE 0.9 % IV BOLUS (SEPSIS)
1000.0000 mL | Freq: Once | INTRAVENOUS | Status: AC
  Administered 2012-12-06: 1000 mL via INTRAVENOUS

## 2012-12-06 MED ORDER — POTASSIUM CHLORIDE 10 MEQ/50ML IV SOLN
10.0000 meq | Freq: Once | INTRAVENOUS | Status: AC
  Administered 2012-12-06: 10 meq via INTRAVENOUS
  Filled 2012-12-06: qty 50

## 2012-12-06 MED ORDER — POTASSIUM CHLORIDE 10 MEQ/50ML IV SOLN
10.0000 meq | INTRAVENOUS | Status: AC
  Administered 2012-12-06: 10 meq via INTRAVENOUS
  Filled 2012-12-06: qty 50

## 2012-12-06 MED ORDER — MAGNESIUM SULFATE 40 MG/ML IJ SOLN: 2.0000 g | Freq: Once | INTRAMUSCULAR | Status: DC

## 2012-12-06 MED ORDER — SODIUM CHLORIDE 0.9 % IV SOLN
6.0000 g | Freq: Once | INTRAVENOUS | Status: AC
  Administered 2012-12-06: 6 g via INTRAVENOUS
  Filled 2012-12-06: qty 12

## 2012-12-06 NOTE — Progress Notes (Signed)
Foley catheter leaking.  Inserted more saline into balloon twice and foley continues to leak.  Attempted to flush foley and was met with resistance.  Discussed with charge RN and replaced foley with new 16Fr foley.  950cc urine return after placing new foley.  Will continue to monitor.

## 2012-12-06 NOTE — Progress Notes (Signed)
[facility] ADULT ICU REPLACEMENT PROTOCOL FOR AM LAB REPLACEMENT ONLY  The patient does apply for the [facility] Adult ICU Electrolyte Replacment Protocol based on the criteria listed below:   1. Is GFR >/= 40 ml/min? yes  Patient's GFR today is 80 2. Is urine output >/= 0.5 ml/kg/hr for the last 6 hours? yes Patient's UOP is .6 ml/kg/hr 3. Is BUN < 60 mg/dL? yes  Patient's BUN today is 14 4. Abnormal electrolyte(s): K 3.4, Mg 1.3, Phosphate 1.7 5. Ordered repletion with: per protocol 6. If a panic level lab has been reported, has the CCM MD in charge been notified? yes.   Physician:  Rocco Serene 12/06/2012 5:49 AM

## 2012-12-06 NOTE — Progress Notes (Signed)
Spoke with Dr. Delford Field concerning MRI order.  MRI currently only has one IV pump that is compatible with the MRI and patient is on 4 drips.  Per Dr. Delford Field, will cancel MRI until patient is more stable. Will continue to monitor.

## 2012-12-06 NOTE — Progress Notes (Signed)
PULMONARY  / CRITICAL CARE MEDICINE  Name: Juan Glenn MRN: [id number] DOB: 1955-05-24    ADMISSION DATE:  12/05/2012 CONSULTATION DATE:  11/36/2014  REFERRING MD :  EDP PRIMARY SERVICE: PCCM  CHIEF COMPLAINT:  Seizure and HTN  BRIEF PATIENT DESCRIPTION: 57 yo with history of PRES syndrome 2 months ago admitted with hypertensive crisis, encephalopathy and possible aspiration.  SIGNIFICANT EVENTS / STUDIES:  11/6  Admitted with hypertensive crisis, encephalopathy and possible aspiration 11/6  TTE >>> EF 60%, no effusion  LINES / TUBES: ETT 11/6 >>> OGT 11/6 >>> R IJ TLC 11/6 >>> Foley 11/6 >>>  CULTURES: Blood 11/6 >>> Urine 11/6 >>> Sputum 11/6 >>>  ANTIBIOTICS: Unasyn 11/6 >>> Vancomycin 11/6 >>>  SUBJECTIVE:  Decreased oxygen requirements overnight. Febrile. Still on vasopressors.  VITAL SIGNS: Temp:  [100.4 F (38 C)-101.6 F (38.7 C)] 101.6 F (38.7 C) (11/07 0500) Pulse Rate:  [86-127] 104 (11/07 0824) Resp:  [0-29] 29 (11/07 0824) BP: (73-149)/(28-78) 135/62 mmHg (11/07 0700) SpO2:  [89 %-100 %] 100 % (11/07 0824) FiO2 (%):  [50 %-80 %] 50 % (11/07 0824)  HEMODYNAMICS: CVP:  [4 mmHg-19 mmHg] 12 mmHg  VENTILATOR SETTINGS: Vent Mode:  [-] PRVC FiO2 (%):  [50 %-80 %] 50 % Set Rate:  [18 bmp-28 bmp] 28 bmp Vt Set:  [570 mL-600 mL] 570 mL PEEP:  [8 cmH20-10 cmH20] 8 cmH20 Plateau Pressure:  [14 cmH20-24 cmH20] 21 cmH20  INTAKE / OUTPUT: Intake/Output     11/06 0701 - 11/07 0700 11/07 0701 - 11/08 0700   I.V. (mL/kg) 3839.6 (40)    IV Piggyback 2150    Total Intake(mL/kg) 5989.6 (62.5)    Urine (mL/kg/hr) 1675 (0.7)    Emesis/NG output 100 (0)    Total Output 1775     Net +4214.6            PHYSICAL EXAMINATION: General:  Mechanically ventilated, synchronous Neuro:  Encephalopathic, nonfocal, cough / gag diminished, follows some commands HEENT:  PERRL, OETT / OGT Cardiovascular:  RRR, no m/r/g Lungs:  Bilateral diminished air entry,  coarse rhonci Abdomen:  Soft, nontender, bowel sounds diminished Musculoskeletal:  Moves all extremities, no edema Skin:  Intact  Recent Labs Lab 12/05/12 0020 12/05/12 0214 12/05/12 0239 12/05/12 0545 12/05/12 0715 12/05/12 1054 12/05/12 1947 12/06/12 0005 12/06/12 0355 12/06/12 0500  HGB 17.3*  --   --  17.7*  --   --   --   --  10.8*  --   WBC 30.8*  --   --  12.4*  --   --   --   --  13.4*  --   PLT 231  --   --  174  --   --   --   --  103*  --   NA 145  --   --  138 140  --   --   --  157*  --   K 4.8  --   --  5.7* 5.5*  --   --   --  3.4*  --   CL 97  --   --  104 106  --   --   --  118*  --   CO2 8*  --   --  13* 14*  --   --   --  13*  --   GLUCOSE 293*  --   --  394* 322*  --   --   --  157*  --  BUN 13  --   --  16 18  --   --   --  14  --   CREATININE 1.31  --   --  1.20 1.40*  --   --   --  1.15  --   CALCIUM 11.2*  --   --  8.4 8.2*  --   --   --  5.0*  --   MG 3.0*  --   --  2.6*  --   --   --   --  1.3*  --   PHOS  --   --   --  4.0  --   --   --   --  1.7*  --   AST 40*  --   --   --  45*  --   --   --   --   --   ALT 37  --   --   --  36  --   --   --   --   --   ALKPHOS 113  --   --   --  70  --   --   --   --   --   BILITOT 0.3  --   --   --  0.5  --   --   --   --   --   PROT 8.6*  --   --   --  6.3  --   --   --   --   --   ALBUMIN 4.5  --   --   --  3.5  --   --   --   --   --   INR 1.21  --   --  1.10  --   --   --   --   --   --   APTT 30  --   --  23*  --   --   --   --   --   --   LATICACIDVEN  --   --  16.72* 9.6*  --   --   --   --   --   --   TROPONINI  --   --   --   --  <0.30  --   --   --   --   --   PHART  --  6.959*  --   --   --  7.218* 7.193* 7.278*  --  7.305*  PCO2ART  --  40.9  --   --   --  37.5 45.3* 34.9*  --  34.0*  PO2ART  --  257.0*  --   --   --  79.0* 114.0* 145.0*  --  126.0*  HCO3  --  9.5*  --   --   --  15.3* 17.2* 16.1*  --  16.0*  O2SAT  --  100.0  --   --   --  93.0 97.0 99.0  --  98.7    Recent Labs Lab  12/05/12 2310 12/06/12 0021 12/06/12 0150 12/06/12 0300 12/06/12 0738  GLUCAP 153* 167* 170* 169* 200*   CXR: 11/7 >>> Improved bilateral airspace disease R>L, hardware in place  ASSESSMENT / PLAN:  PULMONARY A: Acute respiratory failure in setting of seizure and aspiration.  Aspiration pneumonia / pneumonitis. P:   Goal pH>7.30, SpO2>92 Continuous mechanical support D/c ARDS protocol (mainly unilateral process) VAP bundle Daily WUA Trend ABG/CXR  CARDIOVASCULAR A:  Initially hypertensive emergency. Now in shock (septic?) P:  Goal MAP>85 Levophed gtt Vasopressin gtt Trend lactate  RENAL A:  AKI - improving.  Metabolic acidosis (multifactorial).  Hypomagnesemia. Hypophosphatemia. Hypocalcemia. Hypernatremia. Hypokalemia. CVP 5. P:   Goal CVP 10-12 NaPhos 10 x 1 Mg 6 x 1 Ca 1 x 2 K 10 x 2 D/c NS maintenance 1/2 NS@ 75 NS 1000 x 1  GASTROINTESTINAL A:  No active issues. P:   NPO Nutrition to start TF Protonix for GI Px  HEMATOLOGIC A:  Anemia, dilutional? Thrombocytopenia. P:  Trend CBC Add Heparin for DVT Px  INFECTIOUS A:  Aspiration pneumonia. P:   Send PCT Abx and cx as above  ENDOCRINE A:  DM.  Hyperglycemia. P:   SSI Lantus 30 Check ketones  NEUROLOGIC A:  Seizures in setting of hypertensive emergency.  Recent admission for PRESS P:   Neurology following Follow EEG results Dilantin 100 q8h (started per Neuro recs) Follow dilantin level 11/8 MRI ordered per Neuro recs Fentanyl gtt D/c Propofol as hypotensive  I have personally obtained history, examined patient, evaluated and interpreted laboratory and imaging results, reviewed medical records, formulated assessment / plan and placed orders.  CRITICAL CARE:  The patient is critically ill with multiple organ systems failure and requires high complexity decision making for assessment and support, frequent evaluation and titration of therapies, application of advanced monitoring  technologies and extensive interpretation of multiple databases. Critical Care Time devoted to patient care services described in this note is 45 minutes.   Lonia Farber, MD Pulmonary and Critical Care Medicine [facility] Pager: [phone]  12/06/2012, 9:01 AM

## 2012-12-06 NOTE — Progress Notes (Signed)
eLink Physician-Brief Progress Note Patient Name: Juan Glenn DOB: Oct 11, 1955 MRN: [id number]  Date of Service  12/06/2012   HPI/Events of Note     eICU Interventions  Calcium and phos replacement   Intervention Category Intermediate Interventions: Electrolyte abnormality - evaluation and management  Zoe Goonan S. 12/06/2012, 6:01 AM

## 2012-12-06 NOTE — Progress Notes (Signed)
NUTRITION FOLLOW UP  Intervention:   1. Enteral nutrition; initiate Vital High Protein @ 20 mL/hr continuous. Advance by 10 mL q 8 hrs to 70 mL/hr goal to provide 1680 kcal, 147g protein, 1404 mL free water.   NUTRITION DIAGNOSIS:  Inadequate oral intake related to inability to eat as evidenced by NPO, vent.   Monitor:  1. Enteral nutrition; initiation with tolerance. Enteral nutrition to provide 60-70% of estimated calorie needs (22-25 kcals/kg ideal body weight) and 100% of estimated protein needs, based on ASPEN guidelines for permissive underfeeding in critically ill obese individuals.  2. Wt/wt change; monitor trends  Assessment:   Pt admitted with acute respiratory failure. Pt with h/o PRES syndrome 2 months ago.  Pt vomiting yesterday, with aspiration pneumonia.   Patient is currently intubated on ventilator support.  MV: 11.7 L/min Temp:Temp (24hrs), Avg:101.2 F (38.4 C), Min:100.4 F (38 C), Max:101.8 F (38.8 C)  Propofol: none  RD received consult for enteral initiation and management. Will advance slowly due to vomiting on admission.    Height: Ht Readings from Last 1 Encounters:  12/05/12 5\' 9"  (1.753 m)    Weight Status:   Wt Readings from Last 1 Encounters:  12/05/12 211 lb 6.7 oz (95.9 kg)    Re-estimated needs:  Kcal: 2378  Permissive underfeeding: 1308-6578 kcal/d  Protein: 144g  Fluid: ~2.0 L/day  Skin: generalized edema, WNL  Diet Order: NPO   Intake/Output Summary (Last 24 hours) at 12/06/12 1248 Last data filed at 12/06/12 1200  Gross per 24 hour  Intake 7309.17 ml  Output   1775 ml  Net 5534.17 ml    Last BM: PTA   Labs:   Recent Labs Lab 12/05/12 0020 12/05/12 0545 12/05/12 0715 12/06/12 0355  NA 145 138 140 157*  K 4.8 5.7* 5.5* 3.4*  CL 97 104 106 118*  CO2 8* 13* 14* 13*  BUN 13 16 18 14   CREATININE 1.31 1.20 1.40* 1.15  CALCIUM 11.2* 8.4 8.2* 5.0*  MG 3.0* 2.6*  --  1.3*  PHOS  --  4.0  --  1.7*  GLUCOSE 293*  394* 322* 157*    CBG (last 3)   Recent Labs  12/06/12 0300 12/06/12 0738 12/06/12 1200  GLUCAP 169* 200* 216*    Scheduled Meds: . ampicillin-sulbactam (UNASYN) IV  3 g Intravenous Q6H  . antiseptic oral rinse  15 mL Mouth Rinse QID  . calcium gluconate  1 g Intravenous Q4H  . chlorhexidine  15 mL Mouth Rinse BID  . fentaNYL  50 mcg Intravenous Once  . heparin subcutaneous  5,000 Units Subcutaneous Q8H  . insulin aspart  2-6 Units Subcutaneous Q4H  . insulin glargine  30 Units Subcutaneous Q24H  . magnesium sulfate LVP 250-500 ml  6 g Intravenous Once  . pantoprazole (PROTONIX) IV  40 mg Intravenous Q24H  . phenytoin (DILANTIN) IV  100 mg Intravenous Q8H  . pneumococcal 23 valent vaccine  0.5 mL Intramuscular Tomorrow-1000  . potassium chloride  10 mEq Intravenous Once  . sodium phosphate  Dextrose 5% IVPB  10 mmol Intravenous Once  . vancomycin  1,250 mg Intravenous Q12H    Continuous Infusions: . sodium chloride 75 mL/hr at 12/06/12 0928  . dextrose    . fentaNYL infusion INTRAVENOUS 200 mcg/hr (12/06/12 1233)  . norepinephrine (LEVOPHED) Adult infusion 15 mcg/min (12/06/12 1225)  . vasopressin (PITRESSIN) infusion - *FOR SHOCK* 0.03 Units/min (12/05/12 1600)    Loyce Dys, MS RD LDN Clinical Inpatient Dietitian  Pager: [phone] Weekend/After hours pager: [phone]

## 2012-12-07 ENCOUNTER — Inpatient Hospital Stay (HOSPITAL_COMMUNITY): Payer: BC Managed Care – PPO | Admitting: Anesthesiology

## 2012-12-07 ENCOUNTER — Encounter (HOSPITAL_COMMUNITY): Payer: BC Managed Care – PPO | Admitting: Anesthesiology

## 2012-12-07 ENCOUNTER — Inpatient Hospital Stay (HOSPITAL_COMMUNITY): Payer: BC Managed Care – PPO

## 2012-12-07 ENCOUNTER — Encounter (HOSPITAL_COMMUNITY): Payer: Self-pay | Admitting: *Deleted

## 2012-12-07 LAB — BASIC METABOLIC PANEL
BUN: 14 mg/dL (ref 6–23)
CO2: 20 mEq/L (ref 19–32)
Chloride: 117 mEq/L — ABNORMAL HIGH (ref 96–112)
Creatinine, Ser: 1.31 mg/dL (ref 0.50–1.35)
GFR calc Af Amer: 68 mL/min — ABNORMAL LOW (ref >90)
GFR calc non Af Amer: 59 mL/min — ABNORMAL LOW (ref >90)
Glucose, Bld: 159 mg/dL — ABNORMAL HIGH (ref 70–99)
Potassium: 4.4 mEq/L (ref 3.5–5.1)

## 2012-12-07 LAB — GLUCOSE, CAPILLARY
Glucose-Capillary: 107 mg/dL — ABNORMAL HIGH (ref 70–99)
Glucose-Capillary: 124 mg/dL — ABNORMAL HIGH (ref 70–99)
Glucose-Capillary: 128 mg/dL — ABNORMAL HIGH (ref 70–99)
Glucose-Capillary: 151 mg/dL — ABNORMAL HIGH (ref 70–99)
Glucose-Capillary: 153 mg/dL — ABNORMAL HIGH (ref 70–99)
Glucose-Capillary: 159 mg/dL — ABNORMAL HIGH (ref 70–99)
Glucose-Capillary: 163 mg/dL — ABNORMAL HIGH (ref 70–99)
Glucose-Capillary: 169 mg/dL — ABNORMAL HIGH (ref 70–99)
Glucose-Capillary: 172 mg/dL — ABNORMAL HIGH (ref 70–99)
Glucose-Capillary: 181 mg/dL — ABNORMAL HIGH (ref 70–99)
Glucose-Capillary: 187 mg/dL — ABNORMAL HIGH (ref 70–99)
Glucose-Capillary: 189 mg/dL — ABNORMAL HIGH (ref 70–99)
Glucose-Capillary: 197 mg/dL — ABNORMAL HIGH (ref 70–99)
Glucose-Capillary: 211 mg/dL — ABNORMAL HIGH (ref 70–99)
Glucose-Capillary: 215 mg/dL — ABNORMAL HIGH (ref 70–99)
Glucose-Capillary: 224 mg/dL — ABNORMAL HIGH (ref 70–99)

## 2012-12-07 LAB — POCT I-STAT 3, ART BLOOD GAS (G3+)
Bicarbonate: 20.5 mEq/L (ref 20.0–24.0)
TCO2: 22 mmol/L (ref 0–100)
pH, Arterial: 7.33 — ABNORMAL LOW (ref 7.350–7.450)

## 2012-12-07 LAB — MAGNESIUM: Magnesium: 2.4 mg/dL (ref 1.5–2.5)

## 2012-12-07 LAB — PHOSPHORUS: Phosphorus: 1.9 mg/dL — ABNORMAL LOW (ref 2.3–4.6)

## 2012-12-07 MED ORDER — FREE WATER: 200.0000 mL | Freq: Four times a day (QID) | Status: DC

## 2012-12-07 MED ORDER — FENTANYL CITRATE 0.05 MG/ML IJ SOLN
INTRAMUSCULAR | Status: AC
  Filled 2012-12-07: qty 2

## 2012-12-07 MED ORDER — POTASSIUM PHOSPHATE DIBASIC 3 MMOLE/ML IV SOLN
20.0000 mmol | Freq: Once | INTRAVENOUS | Status: AC
  Administered 2012-12-07: 20 mmol via INTRAVENOUS
  Filled 2012-12-07: qty 6.67

## 2012-12-07 MED ORDER — MIDAZOLAM HCL 2 MG/2ML IJ SOLN
INTRAMUSCULAR | Status: AC
  Filled 2012-12-07: qty 2

## 2012-12-07 MED ORDER — SODIUM CHLORIDE 0.9 % IV SOLN
1.0000 mg/h | INTRAVENOUS | Status: DC
  Administered 2012-12-07: 2 mg/h via INTRAVENOUS
  Administered 2012-12-08: 1 mg/h via INTRAVENOUS
  Administered 2012-12-09 – 2012-12-14 (×6): 2 mg/h via INTRAVENOUS
  Filled 2012-12-07 (×9): qty 10

## 2012-12-07 MED ORDER — PANTOPRAZOLE SODIUM 40 MG PO PACK
40.0000 mg | PACK | ORAL | Status: DC
  Administered 2012-12-08 – 2012-12-15 (×8): 40 mg
  Filled 2012-12-07 (×9): qty 20

## 2012-12-07 MED ORDER — PROPOFOL 10 MG/ML IV BOLUS
INTRAVENOUS | Status: DC | PRN
  Administered 2012-12-07: 70 mg via INTRAVENOUS

## 2012-12-07 MED ORDER — FREE WATER
200.0000 mL | Freq: Three times a day (TID) | Status: DC
  Administered 2012-12-07 – 2012-12-08 (×4): 200 mL

## 2012-12-07 NOTE — Progress Notes (Signed)
Subjective: Patient remains intubated.  Last sedation was on 11/7.  Patient remains on Dilantin.  No further seizure activity noted.    Objective: Current vital signs: BP 121/67  Pulse 102  Temp(Src) 101.8 F (38.8 C) (Oral)  Resp 28  Ht 5\' 9"  (1.753 m)  Wt 101.7 kg (224 lb 3.3 oz)  BMI 33.09 kg/m2  SpO2 90% Vital signs in last 24 hours: Temp:  [99.6 F (37.6 C)-101.8 F (38.8 C)] 101.8 F (38.8 C) (11/08 0402) Pulse Rate:  [90-125] 102 (11/08 0700) Resp:  [0-29] 28 (11/08 0700) BP: (87-177)/(11-111) 121/67 mmHg (11/08 0700) SpO2:  [83 %-100 %] 90 % (11/08 0700) FiO2 (%):  [50 %-70 %] 70 % (11/08 0418) Weight:  [101.7 kg (224 lb 3.3 oz)] 101.7 kg (224 lb 3.3 oz) (11/08 0500)  Intake/Output from previous day: 11/07 0701 - 11/08 0700 In: 6092.3 [I.V.:2759; NG/GT:660; IV Piggyback:2673.3] Out: 3080 [Urine:3080] Intake/Output this shift:   Nutritional status: NPO  Neurologic Exam: Mental Status: Patient opens eyes with verbal stimuli and directs  Self toward stimulus.  Follows simple commands.  No verbalizations noted.  Cranial Nerves: II: Blinks to bilateral confrontation, pupils right 3 mm, left 3 mm,and reactive bilaterally III,IV,VI: Visually tracks as I move around the room.  . V,VII: corneal reflex present bilaterally  VIII: grossly intact IX,X: gag reflex unable to test, XI: trapezius strength unable to test bilaterally XII: tongue strength unable to test Motor: Squeezes my hand to command bilaterally and weakly able to lift both arms off the bed.  No movement noted in the lower extremities.  Sensory: Does not respond to noxious stimuli in any extremity. Plantars: mute bilaterally Cerebellar: Unable to perform    Lab Results: Basic Metabolic Panel:  Recent Labs Lab 12/05/12 0020 12/05/12 0545 12/05/12 0715 12/06/12 0355 12/07/12 0500  NA 145 138 140 157* 146*  K 4.8 5.7* 5.5* 3.4* 4.4  CL 97 104 106 118* 117*  CO2 8* 13* 14* 13* 20  GLUCOSE 293*  394* 322* 157* 159*  BUN 13 16 18 14 14   CREATININE 1.31 1.20 1.40* 1.15 1.31  CALCIUM 11.2* 8.4 8.2* 5.0* 8.1*  MG 3.0* 2.6*  --  1.3* 2.4  PHOS  --  4.0  --  1.7* 1.9*    Liver Function Tests:  Recent Labs Lab 12/05/12 0020 12/05/12 0715 12/06/12 1022  AST 40* 45*  --   ALT 37 36  --   ALKPHOS 113 70  --   BILITOT 0.3 0.5  --   PROT 8.6* 6.3  --   ALBUMIN 4.5 3.5 2.5*   No results found for this basename: LIPASE, AMYLASE,  in the last 168 hours No results found for this basename: AMMONIA,  in the last 168 hours  CBC:  Recent Labs Lab 12/05/12 0020 12/05/12 0545 12/06/12 0355  WBC 30.8* 12.4* 13.4*  NEUTROABS 11.4* 10.5*  --   HGB 17.3* 17.7* 10.8*  HCT 50.5 50.7 30.8*  MCV 91.2 87.0 86.0  PLT 231 174 103*    Cardiac Enzymes:  Recent Labs Lab 12/05/12 0020 12/05/12 0715  CKTOTAL 140  --   TROPONINI  --  <0.30    Lipid Panel: No results found for this basename: CHOL, TRIG, HDL, CHOLHDL, VLDL, LDLCALC,  in the last 168 hours  CBG:  Recent Labs Lab 12/06/12 2012 12/06/12 2115 12/06/12 2218 12/06/12 2321 12/07/12 0017  GLUCAP 211* 215* 197* 163* 153*    Microbiology: Results for orders placed during the hospital  encounter of 12/05/12  URINE CULTURE     Status: None   Collection Time    12/05/12 12:41 AM      Result Value Range Status   Specimen Description URINE, RANDOM   Final   Special Requests Normal   Final   Culture  Setup Time     Final   Value: 12/05/2012 02:45     Performed at [facility] Count     Final   Value: NO GROWTH     Performed at [facility]   Culture     Final   Value: NO GROWTH     Performed at [facility]   Report Status 12/06/2012 FINAL   Final  MRSA PCR SCREENING     Status: None   Collection Time    12/05/12  4:47 AM      Result Value Range Status   MRSA by PCR NEGATIVE  NEGATIVE Final   Comment:            The GeneXpert MRSA Assay (FDA     approved for NASAL specimens      only), is one component of a     comprehensive MRSA colonization     surveillance program. It is not     intended to diagnose MRSA     infection nor to guide or     monitor treatment for     MRSA infections.  CULTURE, BLOOD (ROUTINE X 2)     Status: None   Collection Time    12/05/12  5:30 AM      Result Value Range Status   Specimen Description BLOOD RIGHT HAND   Final   Special Requests BOTTLES DRAWN AEROBIC ONLY 2CC   Final   Culture  Setup Time     Final   Value: 12/05/2012 09:24     Performed at [facility]   Culture     Final   Value:        BLOOD CULTURE RECEIVED NO GROWTH TO DATE CULTURE WILL BE HELD FOR 5 DAYS BEFORE ISSUING A FINAL NEGATIVE REPORT     Performed at [facility]   Report Status PENDING   Incomplete  CULTURE, BLOOD (ROUTINE X 2)     Status: None   Collection Time    12/05/12  5:45 AM      Result Value Range Status   Specimen Description BLOOD RIGHT HAND   Final   Special Requests BOTTLES DRAWN AEROBIC ONLY 3CC   Final   Culture  Setup Time     Final   Value: 12/05/2012 09:24     Performed at [facility]   Culture     Final   Value:        BLOOD CULTURE RECEIVED NO GROWTH TO DATE CULTURE WILL BE HELD FOR 5 DAYS BEFORE ISSUING A FINAL NEGATIVE REPORT     Performed at [facility]   Report Status PENDING   Incomplete  URINE CULTURE     Status: None   Collection Time    12/05/12  8:44 AM      Result Value Range Status   Specimen Description URINE, CATHETERIZED   Final   Special Requests NONE   Final   Culture  Setup Time     Final   Value: 12/05/2012 16:14     Performed at [facility] Count     Final  Value: NO GROWTH     Performed at [facility]     Final   Value: NO GROWTH     Performed at [facility]   Report Status 12/06/2012 FINAL   Final  CULTURE, RESPIRATORY (NON-EXPECTORATED)     Status: None   Collection Time    12/05/12  7:49 PM      Result Value Range  Status   Specimen Description TRACHEAL ASPIRATE   Final   Special Requests NONE   Final   Gram Stain     Final   Value: ABUNDANT WBC PRESENT, PREDOMINANTLY PMN     RARE SQUAMOUS EPITHELIAL CELLS PRESENT     FEW GRAM POSITIVE COCCI     IN PAIRS RARE YEAST     Performed at [facility]   Culture PENDING   Incomplete   Report Status PENDING   Incomplete    Coagulation Studies:  Recent Labs  12/05/12 0020 12/05/12 0545  LABPROT 15.0 14.0  INR 1.21 1.10    Imaging: Dg Chest Port 1 View  12/06/2012   CLINICAL DATA:  Aspiration pneumonia  EXAM: PORTABLE CHEST - 1 VIEW  COMPARISON:  12/05/2012  FINDINGS: AG nasogastric catheter is again seen within the stomach. The endotracheal tube is again noted but has withdrawn somewhat from the prior exam. It now lies approximately 10.5 cm above the carina. A right central venous line is again seen with the catheter at the cavoatrial junction. The previously seen bilateral infiltrates have improved when compared with the prior exam. Persistent residual changes are noted worse on the right than the left. No new focal abnormality is noted.  IMPRESSION: Improved aeration of the lungs bilaterally.  Tubes and lines as described.   Electronically Signed   By: Alcide Clever M.D.   On: 12/06/2012 07:46   Dg Chest Port 1 View  12/05/2012   CLINICAL DATA:  Right internal jugular catheter placement.  EXAM: PORTABLE CHEST - 1 VIEW  COMPARISON:  December 05, 2012 at 9:36 a.m.  FINDINGS: The patient has undergone placement of a right internal jugular venous catheter. The tip of the catheter lies in the region of the midportion of the superior vena cava. There is no evidence of a postprocedure pneumothorax.  There remains a dense confluent infiltrate in the right upper lobe abutting the minor fissure, and increased density in the right middle or lower lobe has developed since the earlier study. Patchy density in the left lung is seen both superiorly and inferiorly  which is stable. The cardiac silhouette is normal in size. The endotracheal tube tip lies at the level of the inferior margin of the clavicular heads. Esophagogastric tube tip projects off the film.  IMPRESSION: 1. There is no evidence post procedure complication following placement of a right internal jugular venous catheter. 2. Progressive increase in density in the right upper lobe and in the right middle and lower lower lobes has developed consistent with progressive pneumonia.   Electronically Signed   By: David  [place]   On: 12/05/2012 14:04   Dg Chest Port 1 View  12/05/2012   CLINICAL DATA:  Low oxygen saturation values.  EXAM: PORTABLE CHEST - 1 VIEW  COMPARISON:  December 05, 2012, at 1:27 a.m.  FINDINGS: Since the earlier study density in the inferior aspect of the right upper lobe has increased and is now confluent. There remain patchy areas of a fluffy increased interstitial density in both lungs which are more  conspicuous now. The cardiac silhouette is not enlarged.  The endotracheal tube tip lies just below the inferior margin of the clavicular heads and is at least 4 cm above the carina. The esophagogastric tube demonstrates its proximal port at or just below the GE junction. There is no pleural effusion or pneumothorax.  IMPRESSION: Progressive consolidation of the pulmonary interstitium especially in the right upper lobe is consistent with alveolar filling processes such as pneumonia or pulmonary edema though I favor pneumonia. Given the history of seizure activity aspiration may have occurred.   Electronically Signed   By: David  [place]   On: 12/05/2012 09:57    Medications:  I have reviewed the patient's current medications. Scheduled: . ampicillin-sulbactam (UNASYN) IV  3 g Intravenous Q6H  . antiseptic oral rinse  15 mL Mouth Rinse QID  . chlorhexidine  15 mL Mouth Rinse BID  . fentaNYL      . fentaNYL  50 mcg Intravenous Once  . heparin subcutaneous  5,000 Units Subcutaneous Q8H   . midazolam      . [START ON 12/08/2012] pantoprazole sodium  40 mg Per Tube Q24H  . phenytoin (DILANTIN) IV  100 mg Intravenous Q8H  . pneumococcal 23 valent vaccine  0.5 mL Intramuscular Tomorrow-1000  . vancomycin  1,250 mg Intravenous Q12H    Assessment/Plan: Patient more alert.  Follows commands.  No further seizures noted.  BP controlled.  Last Dilantin level 6.7.    Recommendations: 1.  Although level low there is no evidence of continued seizure activity.  Would maintain Dilantin at current dose.   2.  Will continue to follow with you with follow up MRI to be performed once patient more stable.     LOS: 2 days   Thana Farr, MD [facility] [phone] 12/07/2012  8:12 AM

## 2012-12-07 NOTE — Progress Notes (Signed)
PULMONARY  / CRITICAL CARE MEDICINE  Name: Juan Glenn MRN: [id number] DOB: [birth year]    ADMISSION DATE:  12/05/2012 CONSULTATION DATE:  11/36/2014  REFERRING MD :  EDP PRIMARY SERVICE: PCCM  CHIEF COMPLAINT:  Seizure and HTN  BRIEF PATIENT DESCRIPTION: 57 yo with history of PRES syndrome 2 months ago admitted with hypertensive crisis, encephalopathy and possible aspiration.  SIGNIFICANT EVENTS / STUDIES:  11/6  Admitted with hypertensive crisis, encephalopathy and possible aspiration 11/6  TTE >>> EF 60%, no effusion 11/8: self extubated  LINES / TUBES: ETT 11/6 >>>11/8 (self extubated)>>>11/8>>> OGT 11/6 >>> R IJ TLC 11/6 >>> Foley 11/6 >>>  CULTURES: Blood 11/6 >>> Urine 11/6 >>>neg Sputum 11/6: few GPC>>>  ANTIBIOTICS: Unasyn 11/6 >>> Vancomycin 11/6 >>>  SUBJECTIVE:  Decreased oxygen requirements overnight. Febrile. Still on vasopressors. Self extub this am, thanks to anesthesia reintubated 11/8  VITAL SIGNS: Temp:  [99.6 F (37.6 C)-101.8 F (38.8 C)] 101.8 F (38.8 C) (11/08 0402) Pulse Rate:  [90-125] 102 (11/08 0700) Resp:  [0-28] 28 (11/08 0700) BP: (87-177)/(11-111) 121/67 mmHg (11/08 0700) SpO2:  [83 %-99 %] 90 % (11/08 0700) FiO2 (%):  [50 %-70 %] 70 % (11/08 0418) Weight:  [101.7 kg (224 lb 3.3 oz)] 101.7 kg (224 lb 3.3 oz) (11/08 0500)  HEMODYNAMICS: CVP:  [7 mmHg-11 mmHg] 11 mmHg  VENTILATOR SETTINGS: Vent Mode:  [-] PRVC FiO2 (%):  [50 %-70 %] 70 % Set Rate:  [28 bmp] 28 bmp Vt Set:  [570 mL] 570 mL PEEP:  [5 cmH20-8 cmH20] 8 cmH20 Plateau Pressure:  [21 cmH20] 21 cmH20  INTAKE / OUTPUT: Intake/Output     11/07 0701 - 11/08 0700 11/08 0701 - 11/09 0700   I.V. (mL/kg) 2759 (27.1)    NG/GT 660    IV Piggyback 2673.3    Total Intake(mL/kg) 6092.3 (59.9)    Urine (mL/kg/hr) 3080 (1.3)    Emesis/NG output     Total Output 3080     Net +3012.3            PHYSICAL EXAMINATION: General:  Sedated RASS -2  non-focal, no f/c, +  accessory muscle use.  Neuro:  Encephalopathic, nonfocal, cough / gag diminished, will not f/c HEENT:  No JVD Cardiovascular:  RRR, no m/r/g Lungs: coarse scattered rhonchi Abdomen:  Soft, nontender, bowel sounds diminished Musculoskeletal:  Moves all extremities, no edema Skin:  Intact  CBC Recent Labs     12/05/12  0020  12/05/12  0545  12/06/12  0355  WBC  30.8*  12.4*  13.4*  HGB  17.3*  17.7*  10.8*  HCT  50.5  50.7  30.8*  PLT  231  174  103*    Coag's Recent Labs     12/05/12  0020  12/05/12  0545  APTT  30  23*  INR  1.21  1.10    BMET Recent Labs     12/05/12  0715  12/06/12  0355  12/07/12  0500  NA  140  157*  146*  K  5.5*  3.4*  4.4  CL  106  118*  117*  CO2  14*  13*  20  BUN  18  14  14   CREATININE  1.40*  1.15  1.31  GLUCOSE  322*  157*  159*    Electrolytes Recent Labs     12/05/12  0545  12/05/12  0715  12/06/12  0355  12/07/12  0500  CALCIUM  8.4  8.2*  5.0*  8.1*  MG  2.6*   --   1.3*  2.4  PHOS  4.0   --   1.7*  1.9*    Sepsis Markers Recent Labs     12/06/12  0858  12/07/12  0500  PROCALCITON  59.36  59.70    ABG Recent Labs     12/05/12  1947  12/06/12  0005  12/06/12  0500  PHART  7.193*  7.278*  7.305*  PCO2ART  45.3*  34.9*  34.0*  PO2ART  114.0*  145.0*  126.0*    Liver Enzymes Recent Labs     12/05/12  0020  12/05/12  0715  12/06/12  1022  AST  40*  45*   --   ALT  37  36   --   ALKPHOS  113  70   --   BILITOT  0.3  0.5   --   ALBUMIN  4.5  3.5  2.5*    Cardiac Enzymes Recent Labs     12/05/12  0715  TROPONINI  <0.30  PROBNP  85.4    Glucose Recent Labs     12/06/12  1849  12/06/12  2012  12/06/12  2115  12/06/12  2218  12/06/12  2321  12/07/12  0017  GLUCAP  224*  211*  215*  197*  163*  153*    Imaging Dg Chest Port 1 View  12/06/2012   CLINICAL DATA:  Aspiration pneumonia  EXAM: PORTABLE CHEST - 1 VIEW  COMPARISON:  12/05/2012  FINDINGS: AG nasogastric catheter is again seen  within the stomach. The endotracheal tube is again noted but has withdrawn somewhat from the prior exam. It now lies approximately 10.5 cm above the carina. A right central venous line is again seen with the catheter at the cavoatrial junction. The previously seen bilateral infiltrates have improved when compared with the prior exam. Persistent residual changes are noted worse on the right than the left. No new focal abnormality is noted.  IMPRESSION: Improved aeration of the lungs bilaterally.  Tubes and lines as described.   Electronically Signed   By: Alcide Clever M.D.   On: 12/06/2012 07:46   Dg Chest Port 1 View  12/05/2012   CLINICAL DATA:  Right internal jugular catheter placement.  EXAM: PORTABLE CHEST - 1 VIEW  COMPARISON:  December 05, 2012 at 9:36 a.m.  FINDINGS: The patient has undergone placement of a right internal jugular venous catheter. The tip of the catheter lies in the region of the midportion of the superior vena cava. There is no evidence of a postprocedure pneumothorax.  There remains a dense confluent infiltrate in the right upper lobe abutting the minor fissure, and increased density in the right middle or lower lobe has developed since the earlier study. Patchy density in the left lung is seen both superiorly and inferiorly which is stable. The cardiac silhouette is normal in size. The endotracheal tube tip lies at the level of the inferior margin of the clavicular heads. Esophagogastric tube tip projects off the film.  IMPRESSION: 1. There is no evidence post procedure complication following placement of a right internal jugular venous catheter. 2. Progressive increase in density in the right upper lobe and in the right middle and lower lower lobes has developed consistent with progressive pneumonia.   Electronically Signed   By: David  [place]   On: 12/05/2012 14:04   Dg Chest Port 1 View  12/05/2012   CLINICAL DATA:  Low  oxygen saturation values.  EXAM: PORTABLE CHEST - 1 VIEW   COMPARISON:  December 05, 2012, at 1:27 a.m.  FINDINGS: Since the earlier study density in the inferior aspect of the right upper lobe has increased and is now confluent. There remain patchy areas of a fluffy increased interstitial density in both lungs which are more conspicuous now. The cardiac silhouette is not enlarged.  The endotracheal tube tip lies just below the inferior margin of the clavicular heads and is at least 4 cm above the carina. The esophagogastric tube demonstrates its proximal port at or just below the GE junction. There is no pleural effusion or pneumothorax.  IMPRESSION: Progressive consolidation of the pulmonary interstitium especially in the right upper lobe is consistent with alveolar filling processes such as pneumonia or pulmonary edema though I favor pneumonia. Given the history of seizure activity aspiration may have occurred.   Electronically Signed   By: David  [place]   On: 12/05/2012 09:57     CXR: ZOXW 11/8: with increased edema super-imposed on R>L airspace disease.   ASSESSMENT / PLAN:  PULMONARY A: Acute respiratory failure in setting of seizure and aspiration.  Aspiration pneumonia / pneumonitis. R>L airspace disease Superimposed edema Self extubation 11/8 P:   Full vent support, recruitment and titration of PEEP, due to de-recruitment after self extubation Sedation protocol See ID section Holding Diuretics given labile BP -->will try lasix 11/9 if BP  stable   CARDIOVASCULAR A:   Initially hypertensive emergency.  Now in shock (septic?) vs medication effect as he was significantly hypertensive w/ sedation off P:  Keep euvolemic Goal MAP>85 Cont Levophed gtt and vasopressin Trend lactate  RENAL A:   AKI - improving.   Metabolic acidosis-->improved Hypophosphatemia.  Hypernatremia. improved P:   Cont free water Replace PO4 Trend chem/ replace as indicated  GASTROINTESTINAL A:  No active issues. P:   NPO cont tubefeeds Protonix for GI  Px  HEMATOLOGIC A:  Anemia, marked hgb drop 11/6-->11/7 (possibly dilutional) P: Cbc pending  INFECTIOUS A:  Aspiration pneumonia. P:   trend PCT Abx and cx as above  ENDOCRINE A:  DM.  Hyperglycemia. P:   SSI Lantus 30   NEUROLOGIC A:   Seizures in setting of hypertensive emergency.  Recent admission for PRESS Acute encephalopathy  P:   Neurology following Follow EEG results Dilantin 100 q8h (started per Neuro recs) MRI on hold d/t number of drips/pumps pt is on, reshedule late when more stable Fentanyl gtt Add versed   I have personally obtained history, examined patient, evaluated and interpreted laboratory and imaging results, reviewed medical records, formulated assessment / plan and placed orders.  CRITICAL CARE:  The patient is critically ill with multiple organ systems failure and requires high complexity decision making for assessment and support, frequent evaluation and titration of therapies, application of advanced monitoring technologies and extensive interpretation of multiple databases. Critical Care Time devoted to patient care services described in this note is 35 minutes.   Shan Levans MD [facility]  [phone]  Cell  [phone]  If no response or cell goes to voicemail, call beeper [phone]  Pulmonary and Critical Care Medicine [facility]   12/07/2012, 8:24 AM

## 2012-12-07 NOTE — Progress Notes (Signed)
Spoke with Dr. Delford Field r/t new order for versed. Patient comfortable at this time with a RASS of -2 on previous dose of Fent at 200. Dr. Delford Field agreed that if patient remains comfortable on Fent alone, there is not need to begin Versed. Order will remain incase patient becomes aggitated and Fent becomes maxed at per min. Also discussed OG position with Dr. Delford Field and notified him that RN has advanced approx 8cm as recommended by Radiology. Dr. Delford Field stated that another CXR to confirm placement was not needed post advancement of tube and RN may began feeding per protocol.

## 2012-12-07 NOTE — Anesthesia Preprocedure Evaluation (Signed)
Anesthesia Evaluation  Patient identified by MRN, date of birth, ID band Patient awake    Airway Mallampati: II TM Distance: >3 FB Neck ROM: Full  Mouth opening: Limited Mouth Opening  Dental  (+) Teeth Intact   Pulmonary  breath sounds clear to auscultation        Cardiovascular hypertension, Pt. on medications Rhythm:Regular Rate:Normal     Neuro/Psych    GI/Hepatic   Endo/Other  diabetes, Poorly Controlled, Type obesity  Renal/GU      Musculoskeletal   Abdominal   Peds  Hematology   Anesthesia Other Findings   Reproductive/Obstetrics                           Anesthesia Physical Anesthesia Plan  ASA: IV  Anesthesia Plan: MAC   Post-op Pain Management:    Induction: Intravenous  Airway Management Planned: Oral ETT  Additional Equipment:   Intra-op Plan:   Post-operative Plan: Post-operative intubation/ventilation  Informed Consent:   History available from chart only  Plan Discussed with: Anesthesiologist and CRNA  Anesthesia Plan Comments:         Anesthesia Quick Evaluation

## 2012-12-07 NOTE — Transfer of Care (Signed)
Immediate Anesthesia Transfer of Care Note  Patient: Juan Glenn  Procedure(s) Performed: * No procedures listed *  Patient Location: ICU  Anesthesia Type:General  Level of Consciousness: sedated  Airway & Oxygen Therapy: Patient Spontanous Breathing  Post-op Assessment: Report given to PACU RN  Post vital signs: Reviewed  Complications: No apparent anesthesia complications

## 2012-12-07 NOTE — Progress Notes (Signed)
Notified Anders Simmonds, NP during bedside assessment that RN had turned off vasopressin r/t extreme hypertension during self extubation and bagging. Cindee Lame stated to not resume vasopressin unless patient becomes hypotensive or not maintaining map goal.

## 2012-12-08 ENCOUNTER — Inpatient Hospital Stay (HOSPITAL_COMMUNITY): Payer: BC Managed Care – PPO

## 2012-12-08 LAB — GLUCOSE, CAPILLARY
Glucose-Capillary: 126 mg/dL — ABNORMAL HIGH (ref 70–99)
Glucose-Capillary: 128 mg/dL — ABNORMAL HIGH (ref 70–99)
Glucose-Capillary: 145 mg/dL — ABNORMAL HIGH (ref 70–99)
Glucose-Capillary: 140 mg/dL — ABNORMAL HIGH (ref 70–99)
Glucose-Capillary: 153 mg/dL — ABNORMAL HIGH (ref 70–99)
Glucose-Capillary: 167 mg/dL — ABNORMAL HIGH (ref 70–99)
Glucose-Capillary: 174 mg/dL — ABNORMAL HIGH (ref 70–99)
Glucose-Capillary: 213 mg/dL — ABNORMAL HIGH (ref 70–99)
Glucose-Capillary: 217 mg/dL — ABNORMAL HIGH (ref 70–99)
Glucose-Capillary: 106 mg/dL — ABNORMAL HIGH (ref 70–99)
Glucose-Capillary: 153 mg/dL — ABNORMAL HIGH (ref 70–99)

## 2012-12-08 LAB — BLOOD GAS, ARTERIAL
Bicarbonate: 20.9 mEq/L (ref 20.0–24.0)
MECHVT: 570 mL
O2 Saturation: 95.7 %
PEEP: 10 cmH2O
Patient temperature: 100.8
RATE: 28 resp/min
pCO2 arterial: 40.7 mmHg (ref 35.0–45.0)
pH, Arterial: 7.338 — ABNORMAL LOW (ref 7.350–7.450)

## 2012-12-08 LAB — BASIC METABOLIC PANEL
CO2: 21 mEq/L (ref 19–32)
Calcium: 7.6 mg/dL — ABNORMAL LOW (ref 8.4–10.5)
Chloride: 117 mEq/L — ABNORMAL HIGH (ref 96–112)
Creatinine, Ser: 1.24 mg/dL (ref 0.50–1.35)
Glucose, Bld: 145 mg/dL — ABNORMAL HIGH (ref 70–99)
Potassium: 4 mEq/L (ref 3.5–5.1)
Sodium: 147 mEq/L — ABNORMAL HIGH (ref 135–145)

## 2012-12-08 LAB — CBC
HCT: 32.3 % — ABNORMAL LOW (ref 39.0–52.0)
Hemoglobin: 11 g/dL — ABNORMAL LOW (ref 13.0–17.0)
MCV: 87.1 fL (ref 78.0–100.0)
Platelets: 86 10*3/uL — ABNORMAL LOW (ref 150–400)
RBC: 3.71 MIL/uL — ABNORMAL LOW (ref 4.22–5.81)
WBC: 12.2 10*3/uL — ABNORMAL HIGH (ref 4.0–10.5)

## 2012-12-08 LAB — PROCALCITONIN: Procalcitonin: 38.63 ng/mL

## 2012-12-08 LAB — CULTURE, RESPIRATORY W GRAM STAIN

## 2012-12-08 LAB — PHOSPHORUS: Phosphorus: 2 mg/dL — ABNORMAL LOW (ref 2.3–4.6)

## 2012-12-08 MED ORDER — SODIUM PHOSPHATE 3 MMOLE/ML IV SOLN
10.0000 mmol | Freq: Once | INTRAVENOUS | Status: AC
  Administered 2012-12-08: 10 mmol via INTRAVENOUS
  Filled 2012-12-08: qty 3.33

## 2012-12-08 MED ORDER — FREE WATER
200.0000 mL | Status: DC
  Administered 2012-12-08 – 2012-12-09 (×6): 200 mL

## 2012-12-08 MED ORDER — POTASSIUM CHLORIDE 20 MEQ/15ML (10%) PO LIQD
20.0000 meq | Freq: Two times a day (BID) | ORAL | Status: AC
  Administered 2012-12-08 (×2): 20 meq
  Filled 2012-12-08 (×2): qty 15

## 2012-12-08 MED ORDER — LABETALOL HCL 5 MG/ML IV SOLN
10.0000 mg | INTRAVENOUS | Status: DC | PRN
  Administered 2012-12-09: 10 mg via INTRAVENOUS
  Filled 2012-12-08: qty 4

## 2012-12-08 MED ORDER — FUROSEMIDE 10 MG/ML IJ SOLN
40.0000 mg | Freq: Two times a day (BID) | INTRAMUSCULAR | Status: AC
  Administered 2012-12-08 (×2): 40 mg via INTRAVENOUS
  Filled 2012-12-08 (×2): qty 4

## 2012-12-08 MED ORDER — VANCOMYCIN HCL 10 G IV SOLR
1250.0000 mg | Freq: Three times a day (TID) | INTRAVENOUS | Status: DC
  Filled 2012-12-08: qty 1250

## 2012-12-08 NOTE — Progress Notes (Signed)
PULMONARY  / CRITICAL CARE MEDICINE  Name: Juan Glenn MRN: [id number] DOB: [birth year]    ADMISSION DATE:  12/05/2012 CONSULTATION DATE:  11/36/2014  REFERRING MD :  EDP PRIMARY SERVICE: PCCM  CHIEF COMPLAINT:  Seizure and HTN  BRIEF PATIENT DESCRIPTION: 57 yo with history of PRES syndrome 2 months ago admitted with hypertensive crisis, encephalopathy and possible aspiration.  SIGNIFICANT EVENTS / STUDIES:  11/6  Admitted with hypertensive crisis, encephalopathy and possible aspiration 11/6  TTE >>> EF 60%, no effusion 11/8: self extubated  LINES / TUBES: ETT 11/6 >>>11/8 (self extubated)>>>11/8>>> OGT 11/6 >>> R IJ TLC 11/6 >>> Foley 11/6 >>>  CULTURES: Blood 11/6 >>>neg Urine 11/6 >>>neg Sputum 11/6: few GPC/few candida>>>  ANTIBIOTICS: Unasyn 11/6 >>> Vancomycin 11/6 >>>11/9  SUBJECTIVE:   Mod secretions, calmer on drips  VITAL SIGNS: Temp:  [100.1 F (37.8 C)-102.1 F (38.9 C)] 100.4 F (38 C) (11/09 0800) Pulse Rate:  [91-130] 100 (11/09 0737) Resp:  [0-28] 28 (11/09 0737) BP: (98-171)/(58-95) 146/76 mmHg (11/09 0700) SpO2:  [88 %-100 %] 96 % (11/09 0737) FiO2 (%):  [50 %-60 %] 50 % (11/09 0737) Weight:  [103.3 kg (227 lb 11.8 oz)] 103.3 kg (227 lb 11.8 oz) (11/09 0500)  HEMODYNAMICS: CVP:  [7 mmHg] 7 mmHg  VENTILATOR SETTINGS: Vent Mode:  [-] PRVC FiO2 (%):  [50 %-60 %] 50 % Set Rate:  [28 bmp] 28 bmp Vt Set:  [570 mL] 570 mL PEEP:  [10 cmH20] 10 cmH20 Plateau Pressure:  [22 cmH20-28 cmH20] 22 cmH20  INTAKE / OUTPUT: Intake/Output     11/08 0701 - 11/09 0700 11/09 0701 - 11/10 0700   I.V. (mL/kg) 2511.4 (24.3) 103.1 (1)   NG/GT 1587.8 60   IV Piggyback 650 250   Total Intake(mL/kg) 4749.3 (46) 413.1 (4)   Urine (mL/kg/hr) 3665 (1.5) 125 (0.7)   Total Output 3665 125   Net +1084.3 +288.1          PHYSICAL EXAMINATION: General:  Sedated RASS -2  non-focal, no f/c Neuro:  Encephalopathic, nonfocal, sedated ras -1-2 HEENT:  No  JVD Cardiovascular:  RRR, no m/r/g Lungs: coarse scattered rhonchi Abdomen:  Soft, nontender, bowel sounds diminished Musculoskeletal:  Moves all extremities, no edema Skin:  Intact  CBC Recent Labs     12/06/12  0355  12/08/12  0400  WBC  13.4*  12.2*  HGB  10.8*  11.0*  HCT  30.8*  32.3*  PLT  103*  86*    Coag's No results found for this basename: APTT, INR,  in the last 72 hours  BMET Recent Labs     12/06/12  0355  12/07/12  0500  12/08/12  0400  NA  157*  146*  147*  K  3.4*  4.4  4.0  CL  118*  117*  117*  CO2  13*  20  21  BUN  14  14  15   CREATININE  1.15  1.31  1.24  GLUCOSE  157*  159*  145*    Electrolytes Recent Labs     12/06/12  0355  12/07/12  0500  12/08/12  0400  CALCIUM  5.0*  8.1*  7.6*  MG  1.3*  2.4   --   PHOS  1.7*  1.9*   --     Sepsis Markers Recent Labs     12/06/12  0858  12/07/12  0500  12/08/12  0400  PROCALCITON  59.36  59.70  38.63  ABG Recent Labs     12/06/12  0500  12/07/12  1137  12/08/12  0410  PHART  7.305*  7.330*  7.338*  PCO2ART  34.0*  38.9  40.7  PO2ART  126.0*  150.0*  84.7    Liver Enzymes Recent Labs     12/06/12  1022  ALBUMIN  2.5*    Cardiac Enzymes No results found for this basename: TROPONINI, PROBNP,  in the last 72 hours  Glucose Recent Labs     12/07/12  2355  12/08/12  0054  12/08/12  0147  12/08/12  0248  12/08/12  0342  12/08/12  0450  GLUCAP  126*  145*  128*  153*  140*  140*    Imaging Dg Chest Port 1 View  12/08/2012   CLINICAL DATA:  ETT  EXAM: PORTABLE CHEST - 1 VIEW  COMPARISON:  12/07/2012  FINDINGS: Multifocal patchy interstitial/ airspace opacities, right upper lobe predominant, suspicious for pneumonia. Superimposed mild interstitial edema is possible. Suspected small left pleural effusion. No pneumothorax.  The heart is normal in size.  Endotracheal tube terminates 4.5 cm above the carina.  Stable right IJ venous catheter.  Enteric tube courses below the  diaphragm.  IMPRESSION: Endotracheal tube terminates 4.5 cm above the carina.  Mild patchy interstitial/airspace opacities, suspicious for multifocal pneumonia.  Superimposed mild interstitial edema is possible. Suspected small left pleural effusion.   Electronically Signed   By: Charline Bills M.D.   On: 12/08/2012 08:17   Dg Chest Port 1 View  12/07/2012   CLINICAL DATA:  Hypoxia, intubated  EXAM: PORTABLE CHEST - 1 VIEW  COMPARISON:  DG CHEST 1V PORT dated 12/06/2012  FINDINGS: Endotracheal tube is 5 cm from carinal in good position. Stable cardiac silhouette. Right central venous line unchanged. There is increased density of the right upper lobe airspace disease. There is increased airspace disease in the right lower lobe and left lower lobe.  IMPRESSION: Increasing bilateral airspace disease suggesting pulmonary edema or multifocal pneumonia. Favor edema.  Endotracheal tube in good position.   Electronically Signed   By: Genevive Bi M.D.   On: 12/07/2012 08:47   Dg Abd Portable 1v  12/07/2012   CLINICAL DATA:  OG tube placement.  EXAM: PORTABLE ABDOMEN - 1 VIEW  COMPARISON:  12/05/2012 and chest x-ray 12/07/2012  FINDINGS: Height KUB demonstrates enteric tube with tip overlying the stomach in the left upper quadrant likely overlying the gastric fundus. The non radiopaque segment of the enteric tube is at the level of the gastroesophageal junction as the tube could be advanced 5-8 cm. Bowel gas pattern is nonobstructive. Multifocal airspace process over the lungs worse over the right upper lobe as seen on the recent chest radiograph.  IMPRESSION: Nonobstructive bowel gas pattern.  Enteric tube with tip over the stomach in the left upper quadrant. This could be advanced approximately 5-8 cm.  Multifocal pulmonary airspace process likely infection.   Electronically Signed   By: Elberta Fortis M.D.   On: 12/07/2012 09:14     CXR: [facility] 11/9  R>L diffuse airspace disease. No sig improvement    ASSESSMENT / PLAN:  PULMONARY A:  Acute respiratory failure in setting of seizure and aspiration.  Aspiration pneumonia / pneumonitis. Acute lung injury w/ component of pulmonary edema Self extubation 11/8 now back on vent P:   Full vent support, ARDS protocol Sedation protocol See ID section Diuresis per FACT approach  CARDIOVASCULAR A:   Initially hypertensive emergency.  Hypotension  now resolved P:  Keep euvolemic Goal MAP>85 Off vasopressors   RENAL A:   AKI - improving.   Metabolic acidosis resolved Hypophosphatemia.  Hypernatremia. improved P:   Cont free water Replete PO4 Trend chem/ replace as indicated  GASTROINTESTINAL A:  No active issues. P:   cont tubefeeds Protonix for GI Px  HEMATOLOGIC A:  Anemia, and thrombocytopenia marked hgb drop 11/6-->11/7 (possibly dilutional) P: Trend cbc Transfuse for hgb <7 If plt cont drop will need to check HIT panel  INFECTIOUS A:  Aspiration pneumonia. P:   trend PCT Abx and cx as above, d/c vanc  ENDOCRINE A:  DM.  Hyperglycemia. P:   SSI Cont Lantus 30   NEUROLOGIC A:   Seizures in setting of hypertensive emergency.  Recent admission for PRESS Acute encephalopathy  P:   Neurology following Dilantin 100 q8h (started per Neuro recs) MRI on hold d/t number of drips/pumps pt is on, reshedule late when more stable Fentanyl gtt   I have personally obtained history, examined patient, evaluated and interpreted laboratory and imaging results, reviewed medical records, formulated assessment / plan and placed orders.  CRITICAL CARE:  The patient is critically ill with multiple organ systems failure and requires high complexity decision making for assessment and support, frequent evaluation and titration of therapies, application of advanced monitoring technologies and extensive interpretation of multiple databases. Critical Care Time devoted to patient care services described in this note is 35  minutes.   Dorcas Carrow Beeper  [phone]  Cell  [phone]  If no response or cell goes to voicemail, call beeper [phone]    12/08/2012, 8:43 AM

## 2012-12-08 NOTE — Progress Notes (Signed)
ANTIBIOTIC CONSULT NOTE - FOLLOW UP  Pharmacy Consult for Vancomycin and Unasyn Indication: aspiration pneumonia  No Known Allergies  Patient Measurements: Height: 5\' 9"  (175.3 cm) Weight: 227 lb 11.8 oz (103.3 kg) IBW/kg (Calculated) : 70.7  Vital Signs: Temp: 100.4 F (38 C) (11/09 0800) Temp src: Oral (11/09 0800) BP: 146/76 mmHg (11/09 0700) Pulse Rate: 100 (11/09 0737) Intake/Output from previous day: 11/08 0701 - 11/09 0700 In: 4749.3 [I.V.:2511.4; WG/NF:6213.0; IV Piggyback:650] Out: 3665 [Urine:3665] Intake/Output from this shift: Total I/O In: 413.1 [I.V.:103.1; NG/GT:60; IV Piggyback:250] Out: 125 [Urine:125]  Labs:  Recent Labs  12/06/12 0355 12/07/12 0500 12/08/12 0400  WBC 13.4*  --  12.2*  HGB 10.8*  --  11.0*  PLT 103*  --  86*  CREATININE 1.15 1.31 1.24   Estimated Creatinine Clearance: 77.8 ml/min (by C-G formula based on Cr of 1.24).  Recent Labs  12/07/12 1953 12/08/12 0730  VANCOTROUGH  --  10.3  VANCORANDOM 10.3  --     Assessment: 56yom continues on day #4 vancomycin and unasyn for suspected aspiration pneumonia. He continues to spike fevers although WBC and PCT are trending down. Renal function has remained stable with good UOP. Vancomycin trough drawn this morning is subtherapeutic.  11/6 Unasyn>> 11/6 Vanc>> 11/9 VT = 10.3 on 1250mg  q12 11/6 blood>> ngtd 11/6 urine>> negative final 11/6 resp>> few gram pos cocci, few candida albicans  Goal of Therapy:  Vancomycin trough level 15-20 mcg/ml  Plan:  1) Increase vancomycin to 1250mg  IV q8 for predicted trough of ~18 2) Continue unasyn 3g IV q6 3) Continue to follow renal function, cultures, another trough at new steady state 4) Hold sq heparin with platelets now < 100??  Fredrik Rigger 12/08/2012,8:31 AM

## 2012-12-08 NOTE — Progress Notes (Signed)
Switched PT to cpap/ps of  5 over 5. PT became very agitated and destated

## 2012-12-09 ENCOUNTER — Inpatient Hospital Stay (HOSPITAL_COMMUNITY): Payer: BC Managed Care – PPO

## 2012-12-09 DIAGNOSIS — G40401 Other generalized epilepsy and epileptic syndromes, not intractable, with status epilepticus: Secondary | ICD-10-CM

## 2012-12-09 LAB — BLOOD GAS, ARTERIAL
Acid-Base Excess: 1.3 mmol/L (ref 0.0–2.0)
Bicarbonate: 26.4 mEq/L — ABNORMAL HIGH (ref 20.0–24.0)
Bicarbonate: 25.6 mEq/L — ABNORMAL HIGH (ref 20.0–24.0)
Drawn by: 34779
FIO2: 0.5 %
O2 Saturation: 93.7 %
PEEP: 5 cmH2O
Patient temperature: 100.7
RATE: 14 resp/min
TCO2: 27.7 mmol/L (ref 0–100)
pCO2 arterial: 44.4 mmHg (ref 35.0–45.0)
pCO2 arterial: 41.9 mmHg (ref 35.0–45.0)
pH, Arterial: 7.398 (ref 7.350–7.450)
pH, Arterial: 7.403 (ref 7.350–7.450)
pO2, Arterial: 67 mmHg — ABNORMAL LOW (ref 80.0–100.0)

## 2012-12-09 LAB — CBC
HCT: 35.6 % — ABNORMAL LOW (ref 39.0–52.0)
Platelets: 109 10*3/uL — ABNORMAL LOW (ref 150–400)
RBC: 4.1 MIL/uL — ABNORMAL LOW (ref 4.22–5.81)
WBC: 12.6 10*3/uL — ABNORMAL HIGH (ref 4.0–10.5)

## 2012-12-09 LAB — GLUCOSE, CAPILLARY
Glucose-Capillary: 152 mg/dL — ABNORMAL HIGH (ref 70–99)
Glucose-Capillary: 153 mg/dL — ABNORMAL HIGH (ref 70–99)
Glucose-Capillary: 181 mg/dL — ABNORMAL HIGH (ref 70–99)
Glucose-Capillary: 183 mg/dL — ABNORMAL HIGH (ref 70–99)
Glucose-Capillary: 202 mg/dL — ABNORMAL HIGH (ref 70–99)
Glucose-Capillary: 140 mg/dL — ABNORMAL HIGH (ref 70–99)
Glucose-Capillary: 127 mg/dL — ABNORMAL HIGH (ref 70–99)
Glucose-Capillary: 102 mg/dL — ABNORMAL HIGH (ref 70–99)
Glucose-Capillary: 104 mg/dL — ABNORMAL HIGH (ref 70–99)
Glucose-Capillary: 168 mg/dL — ABNORMAL HIGH (ref 70–99)
Glucose-Capillary: 200 mg/dL — ABNORMAL HIGH (ref 70–99)
Glucose-Capillary: 213 mg/dL — ABNORMAL HIGH (ref 70–99)
Glucose-Capillary: 106 mg/dL — ABNORMAL HIGH (ref 70–99)
Glucose-Capillary: 134 mg/dL — ABNORMAL HIGH (ref 70–99)
Glucose-Capillary: 168 mg/dL — ABNORMAL HIGH (ref 70–99)

## 2012-12-09 LAB — COMPREHENSIVE METABOLIC PANEL
ALT: 26 U/L (ref 0–53)
AST: 46 U/L — ABNORMAL HIGH (ref 0–37)
Alkaline Phosphatase: 79 U/L (ref 39–117)
BUN: 21 mg/dL (ref 6–23)
CO2: 27 mEq/L (ref 19–32)
Calcium: 8.7 mg/dL (ref 8.4–10.5)
Chloride: 111 mEq/L (ref 96–112)
GFR calc non Af Amer: 60 mL/min — ABNORMAL LOW (ref >90)
Glucose, Bld: 145 mg/dL — ABNORMAL HIGH (ref 70–99)
Sodium: 149 mEq/L — ABNORMAL HIGH (ref 135–145)
Total Bilirubin: 0.4 mg/dL (ref 0.3–1.2)

## 2012-12-09 MED ORDER — POTASSIUM CHLORIDE 20 MEQ/15ML (10%) PO LIQD
20.0000 meq | ORAL | Status: AC
  Administered 2012-12-09 (×2): 20 meq
  Filled 2012-12-09 (×2): qty 15

## 2012-12-09 MED ORDER — POTASSIUM CHLORIDE 20 MEQ/15ML (10%) PO LIQD
40.0000 meq | Freq: Once | ORAL | Status: DC
  Filled 2012-12-09: qty 30

## 2012-12-09 MED ORDER — LABETALOL HCL 200 MG PO TABS
200.0000 mg | ORAL_TABLET | Freq: Two times a day (BID) | ORAL | Status: DC
  Administered 2012-12-09 – 2012-12-18 (×19): 200 mg via ORAL
  Filled 2012-12-09 (×20): qty 1

## 2012-12-09 MED ORDER — FREE WATER
300.0000 mL | Status: DC
  Administered 2012-12-09 – 2012-12-12 (×18): 300 mL

## 2012-12-09 MED ORDER — ASPIRIN 325 MG PO TABS
325.0000 mg | ORAL_TABLET | Freq: Every day | ORAL | Status: DC
  Administered 2012-12-09 – 2012-12-18 (×10): 325 mg via ORAL
  Filled 2012-12-09 (×10): qty 1

## 2012-12-09 MED ORDER — HYDRALAZINE HCL 20 MG/ML IJ SOLN
10.0000 mg | INTRAMUSCULAR | Status: DC | PRN
  Administered 2012-12-09 – 2012-12-13 (×2): 10 mg via INTRAVENOUS
  Filled 2012-12-09 (×2): qty 1

## 2012-12-09 NOTE — Progress Notes (Signed)
Restraints discontinued; awaiting restraints reorder by MD; pt spouse at bedside

## 2012-12-09 NOTE — Progress Notes (Signed)
NEURO HOSPITALIST PROGRESS NOTE   SUBJECTIVE:                                                                                                                        Remains intubated on the vent.  Open his eyes but doesn't follow commands. On dilantin 100 mg IV TID, with dilantin level 6.7 on 12/06/12   OBJECTIVE:                                                                                                                           Vital signs in last 24 hours: Temp:  [99.5 F (37.5 C)-102.5 F (39.2 C)] 101.5 F (38.6 C) (11/10 0753) Pulse Rate:  [94-129] 112 (11/10 0814) Resp:  [28-31] 31 (11/10 0814) BP: (104-183)/(47-100) 148/81 mmHg (11/10 0814) SpO2:  [87 %-98 %] 97 % (11/10 0814) FiO2 (%):  [50 %] 50 % (11/10 0814) Weight:  [100.4 kg (221 lb 5.5 oz)] 100.4 kg (221 lb 5.5 oz) (11/10 0408)  Intake/Output from previous day: 11/09 0701 - 11/10 0700 In: 5906.5 [I.V.:2063.2; NG/GT:2940; IV Piggyback:903.3] Out: 9835 [Urine:9835] Intake/Output this shift: Total I/O In: 70 [NG/GT:70] Out: 160 [Urine:160] Nutritional status: NPO  Past Medical History  Diagnosis Date  . Diabetes mellitus without complication 2009  . Renal disorder   . Hyperlipidemia   . Difficult intubation     Neurologic Exam:  Mental Status:  Patient opens eyes with verbal stimuli and directs self toward stimulus. No following commands. No verbalizations noted.  Cranial Nerves:  II: Blinks to bilateral confrontation, pupils right 3 mm, left 3 mm,and reactive bilaterally  III,IV,VI: Visually tracks as I move around the room. .  V,VII: corneal reflex present bilaterally  VIII: grossly intact  IX,X: gag reflex unable to test, XI: trapezius strength unable to test bilaterally  XII: tongue strength unable to test  Motor:  No movement noted in the lower extremities.  Sensory:  Does not respond to noxious stimuli in any extremity.  Plantars:  mute bilaterally   Cerebellar:  Unable to perform   Lab Results: Lab Results  Component Value Date/Time   CHOL 110 09/07/2012  5:40 AM   Lipid Panel No results found for this basename: CHOL, TRIG, HDL, CHOLHDL,  VLDL, LDLCALC,  in the last 72 hours  Studies/Results: Dg Chest Port 1 View  12/09/2012   CLINICAL DATA:  Shortness of breath.  EXAM: PORTABLE CHEST - 1 VIEW  COMPARISON:  11/ 09/2012.  FINDINGS: Endotracheal tube is been partially withdrawn. The endotracheal tube tip is 8.4 cm proximal to the carina. Right IJ catheter in stable position. NG tube below the left hemidiaphragm. There has been slight partial clearing of bilateral pulmonary infiltrates. This suggests the possibility of clearing pneumonia or pulmonary edema. Heart size is stable. No pneumothorax.  IMPRESSION: Interim partial withdrawal of endotracheal tube.  Slight partial clearing of bilateral pulmonary infiltrates.   Electronically Signed   By: Maisie Fus  Register   On: 12/09/2012 07:01   Dg Chest Port 1 View  12/08/2012   CLINICAL DATA:  ETT  EXAM: PORTABLE CHEST - 1 VIEW  COMPARISON:  12/07/2012  FINDINGS: Multifocal patchy interstitial/ airspace opacities, right upper lobe predominant, suspicious for pneumonia. Superimposed mild interstitial edema is possible. Suspected small left pleural effusion. No pneumothorax.  The heart is normal in size.  Endotracheal tube terminates 4.5 cm above the carina.  Stable right IJ venous catheter.  Enteric tube courses below the diaphragm.  IMPRESSION: Endotracheal tube terminates 4.5 cm above the carina.  Mild patchy interstitial/airspace opacities, suspicious for multifocal pneumonia.  Superimposed mild interstitial edema is possible. Suspected small left pleural effusion.   Electronically Signed   By: Charline Bills M.D.   On: 12/08/2012 08:17   Dg Chest Port 1 View  12/07/2012   CLINICAL DATA:  Hypoxia, intubated  EXAM: PORTABLE CHEST - 1 VIEW  COMPARISON:  DG CHEST 1V PORT dated 12/06/2012  FINDINGS:  Endotracheal tube is 5 cm from carinal in good position. Stable cardiac silhouette. Right central venous line unchanged. There is increased density of the right upper lobe airspace disease. There is increased airspace disease in the right lower lobe and left lower lobe.  IMPRESSION: Increasing bilateral airspace disease suggesting pulmonary edema or multifocal pneumonia. Favor edema.  Endotracheal tube in good position.   Electronically Signed   By: Genevive Bi M.D.   On: 12/07/2012 08:47   Dg Abd Portable 1v  12/07/2012   CLINICAL DATA:  OG tube placement.  EXAM: PORTABLE ABDOMEN - 1 VIEW  COMPARISON:  12/05/2012 and chest x-ray 12/07/2012  FINDINGS: Height KUB demonstrates enteric tube with tip overlying the stomach in the left upper quadrant likely overlying the gastric fundus. The non radiopaque segment of the enteric tube is at the level of the gastroesophageal junction as the tube could be advanced 5-8 cm. Bowel gas pattern is nonobstructive. Multifocal airspace process over the lungs worse over the right upper lobe as seen on the recent chest radiograph.  IMPRESSION: Nonobstructive bowel gas pattern.  Enteric tube with tip over the stomach in the left upper quadrant. This could be advanced approximately 5-8 cm.  Multifocal pulmonary airspace process likely infection.   Electronically Signed   By: Elberta Fortis M.D.   On: 12/07/2012 09:14    MEDICATIONS  I have reviewed the patient's current medications.  ASSESSMENT/PLAN:                                                                                                           Encephalopathy. MRI brain when stable. Continue dilantin Will follow up  Wyatt Portela, MD [facility] [phone]  12/09/2012, 8:33 AM

## 2012-12-09 NOTE — Progress Notes (Signed)
eLink Physician-Brief Progress Note Patient Name: Juan Glenn DOB: [birth year] MRN: [id number]  Date of Service  12/09/2012   HPI/Events of Note   Fever 102.1, on amp-sulbactam for PNA. Last cx data from 11/6  eICU Interventions  - send cx blood + urine + resp.    Intervention Category Intermediate Interventions: Infection - evaluation and management  BYRUM,ROBERT S. 12/09/2012, 4:18 PM

## 2012-12-09 NOTE — Progress Notes (Signed)
[facility] ADULT ICU REPLACEMENT PROTOCOL FOR AM LAB REPLACEMENT ONLY  The patient does apply for the [facility] Adult ICU Electrolyte Replacment Protocol based on the criteria listed below:   1. Is GFR >/= 40 ml/min? yes  Patient's GFR today is 70 2. Is urine output >/= 0.5 ml/kg/hr for the last 6 hours? yes Patient's UOP is 1.7 ml/kg/hr 3. Is BUN < 60 mg/dL? yes  Patient's BUN today is 21 4. Abnormal electrolyte(s): K+3.5 5. Ordered repletion with:protocol 6. If a panic level lab has been reported, has the CCM MD in charge been notified? yes.   Physician:  Wyline Mood [facility] 12/09/2012 6:11 AM

## 2012-12-09 NOTE — Progress Notes (Signed)
PULMONARY  / CRITICAL CARE MEDICINE  Name: Juan Glenn MRN: [id number] DOB: Nov 12, 1955    ADMISSION DATE:  12/05/2012 CONSULTATION DATE:  11/36/2014  REFERRING MD :  EDP PRIMARY SERVICE: PCCM  CHIEF COMPLAINT:  Seizure and HTN  BRIEF PATIENT DESCRIPTION: 57 yo with history of PRES syndrome 2 months ago admitted with hypertensive crisis, encephalopathy and possible aspiration.  SIGNIFICANT EVENTS / STUDIES:  11/6  Admitted with hypertensive crisis, encephalopathy and possible aspiration 11/6  TTE >>> EF 60%, no effusion 11/8  Self extubated, reintubated  LINES / TUBES: ETT 11/6 >>>11/8 (self extubated) >>> 11/8>>> OGT 11/6 >>> R IJ TLC 11/6 >>> Foley 11/6 >>>  CULTURES: Blood 11/6 >>> neg Urine 11/6 >>> neg Sputum 11/6 >>> few GPC/few candida >>>  ANTIBIOTICS: Unasyn 11/6 >>> Vancomycin 11/6 >>>11/9  SUBJECTIVE:  Appropriate with sedation off.  VITAL SIGNS: Temp:  [99.5 F (37.5 C)-102.5 F (39.2 C)] 101.5 F (38.6 C) (11/10 0753) Pulse Rate:  [94-129] 114 (11/10 0900) Resp:  [28-31] 28 (11/10 0900) BP: (104-183)/(47-110) 178/110 mmHg (11/10 0900) SpO2:  [87 %-98 %] 92 % (11/10 0900) FiO2 (%):  [50 %] 50 % (11/10 0814) Weight:  [100.4 kg (221 lb 5.5 oz)] 100.4 kg (221 lb 5.5 oz) (11/10 0408)  HEMODYNAMICS: CVP:  [7 mmHg-8 mmHg] 8 mmHg  VENTILATOR SETTINGS: Vent Mode:  [-] PRVC FiO2 (%):  [50 %] 50 % Set Rate:  [28 bmp] 28 bmp Vt Set:  [570 mL] 570 mL PEEP:  [5 cmH20] 5 cmH20 Plateau Pressure:  [14 cmH20-20 cmH20] 20 cmH20  INTAKE / OUTPUT: Intake/Output     11/09 0701 - 11/10 0700 11/10 0701 - 11/11 0700   I.V. (mL/kg) 2063.2 (20.5) 163.9 (1.6)   NG/GT 2940 240   IV Piggyback 903.3    Total Intake(mL/kg) 5906.5 (58.8) 403.9 (4)   Urine (mL/kg/hr) 9835 (4.1) 310 (1.1)   Total Output 9835 310   Net -3928.5 +93.9          PHYSICAL EXAMINATION: General:  Mechanically ventilated, synchronous Neuro:  Encephalopathic, nonfocal, gag / cough  present HEENT:  No JVD Cardiovascular:  RRR, no m/r/g Lungs: Bilateral air entry, few rhonchi Abdomen:  Soft, nontender, bowel sounds diminished Musculoskeletal:  Moves all extremities, no edema Skin:  Intact  CBC  Recent Labs Lab 12/06/12 0355 12/08/12 0400 12/09/12 0417  WBC 13.4* 12.2* 12.6*  HGB 10.8* 11.0* 12.3*  HCT 30.8* 32.3* 35.6*  PLT 103* 86* 109*   Coag's  Recent Labs Lab 12/05/12 0020 12/05/12 0545  APTT 30 23*  INR 1.21 1.10   BMET  Recent Labs Lab 12/07/12 0500 12/08/12 0400 12/09/12 0417  NA 146* 147* 149*  K 4.4 4.0 3.5  CL 117* 117* 111  CO2 20 21 27   BUN 14 15 21   CREATININE 1.31 1.24 1.29  GLUCOSE 159* 145* 145*   Electrolytes  Recent Labs Lab 12/05/12 0545  12/06/12 0355 12/07/12 0500 12/08/12 0400 12/09/12 0417  CALCIUM 8.4  < > 5.0* 8.1* 7.6* 8.7  MG 2.6*  --  1.3* 2.4  --   --   PHOS 4.0  --  1.7* 1.9* 2.0*  --   < > = values in this interval not displayed.  Sepsis Markers  Recent Labs Lab 12/06/12 0846 12/06/12 0858 12/06/12 1446 12/06/12 2046 12/07/12 0500 12/08/12 0400  LATICACIDVEN 2.8*  --  3.0* 3.3*  --   --   PROCALCITON  --  59.36  --   --  59.70  38.63   ABG  Recent Labs Lab 12/07/12 1137 12/08/12 0410 12/09/12 0550  PHART 7.330* 7.338* 7.398  PCO2ART 38.9 40.7 44.4  PO2ART 150.0* 84.7 70.9*   Liver Enzymes  Recent Labs Lab 12/05/12 0020 12/05/12 0715 12/06/12 1022 12/09/12 0417  AST 40* 45*  --  46*  ALT 37 36  --  26  ALKPHOS 113 70  --  79  BILITOT 0.3 0.5  --  0.4  ALBUMIN 4.5 3.5 2.5* 2.4*   Cardiac Enzymes  Recent Labs Lab 12/05/12 0715  TROPONINI <0.30  PROBNP 85.4   Glucose  Recent Labs Lab 12/09/12 0351 12/09/12 0453 12/09/12 0543 12/09/12 0643 12/09/12 0752 12/09/12 0856  GLUCAP 152* 140* 159* 138* 127* 117*   CXR: 11/10 >>> Hardware in good position, diffuse bilateral airspace disease  ASSESSMENT / PLAN:  PULMONARY A: Acute respiratory failure in setting  of seizure and aspiration.  Aspiration pneumonia / pneumonitis / ?ARDS. P:   Goal pH>7.30, SpO2>92 Continuous mechanical support 6 cc/lg Decrease rate to 14 f/b ABG VAP bundle Daily WUA Trend ABG/CXR  CARDIOVASCULAR A:  Hypotension initially.  Now off vasopressors, hypertensive. P:  Goal MAP>85 Add ASA Add Labetalol 200 bid Add Hydralazine PRN  RENAL A:  AKI / Cr plateaued. Metabolic acidosis- resolved, now contraction alkalosis. Hypernatremia.  Hypokalemia. P:   Increase free water to 300 q4h K 20 x 2 Trend BMP D/c 1/2 NS  GASTROINTESTINAL A:  No active issues. P:   TF per Nutrition Protonix for GI Px  HEMATOLOGIC A:  Anemia. Thrombocytopenia - stable. P: Trend CBC Heparin for DVT Px  INFECTIOUS A:  Aspiration pneumonia. P:   Abx / cx as above  ENDOCRINE A:  DM.  Hyperglycemia. P:   Insulin gtt, do not convert  NEUROLOGIC A:  Seizures in setting of hypertensive emergency.  Recent admission for PRESS. Acute encephalopathy  P:   Neurology following Dilantin 100 q8h (started per Neuro recs) MRI when able Fentanyl / Versed gtt  I have personally obtained history, examined patient, evaluated and interpreted laboratory and imaging results, reviewed medical records, formulated assessment / plan and placed orders.  CRITICAL CARE:  The patient is critically ill with multiple organ systems failure and requires high complexity decision making for assessment and support, frequent evaluation and titration of therapies, application of advanced monitoring technologies and extensive interpretation of multiple databases. Critical Care Time devoted to patient care services described in this note is 35 minutes.   Ihor Austin Pulmonary and Critical Care Medicine [facility] Pager: [phone]  12/09/2012, 9:56 AM

## 2012-12-10 ENCOUNTER — Inpatient Hospital Stay (HOSPITAL_COMMUNITY): Payer: BC Managed Care – PPO

## 2012-12-10 LAB — CBC
HCT: 33.4 % — ABNORMAL LOW (ref 39.0–52.0)
Hemoglobin: 11.5 g/dL — ABNORMAL LOW (ref 13.0–17.0)
MCHC: 34.4 g/dL (ref 30.0–36.0)
RBC: 3.86 MIL/uL — ABNORMAL LOW (ref 4.22–5.81)
WBC: 10.8 10*3/uL — ABNORMAL HIGH (ref 4.0–10.5)

## 2012-12-10 LAB — GLUCOSE, CAPILLARY
Glucose-Capillary: 118 mg/dL — ABNORMAL HIGH (ref 70–99)
Glucose-Capillary: 137 mg/dL — ABNORMAL HIGH (ref 70–99)
Glucose-Capillary: 215 mg/dL — ABNORMAL HIGH (ref 70–99)
Glucose-Capillary: 226 mg/dL — ABNORMAL HIGH (ref 70–99)
Glucose-Capillary: 119 mg/dL — ABNORMAL HIGH (ref 70–99)
Glucose-Capillary: 133 mg/dL — ABNORMAL HIGH (ref 70–99)
Glucose-Capillary: 143 mg/dL — ABNORMAL HIGH (ref 70–99)
Glucose-Capillary: 159 mg/dL — ABNORMAL HIGH (ref 70–99)
Glucose-Capillary: 186 mg/dL — ABNORMAL HIGH (ref 70–99)
Glucose-Capillary: 171 mg/dL — ABNORMAL HIGH (ref 70–99)
Glucose-Capillary: 187 mg/dL — ABNORMAL HIGH (ref 70–99)
Glucose-Capillary: 145 mg/dL — ABNORMAL HIGH (ref 70–99)
Glucose-Capillary: 180 mg/dL — ABNORMAL HIGH (ref 70–99)

## 2012-12-10 LAB — BASIC METABOLIC PANEL
BUN: 22 mg/dL (ref 6–23)
CO2: 26 mEq/L (ref 19–32)
Chloride: 115 mEq/L — ABNORMAL HIGH (ref 96–112)
GFR calc non Af Amer: 64 mL/min — ABNORMAL LOW (ref >90)
Glucose, Bld: 136 mg/dL — ABNORMAL HIGH (ref 70–99)
Potassium: 3.6 mEq/L (ref 3.5–5.1)

## 2012-12-10 LAB — URINE CULTURE

## 2012-12-10 MED ORDER — POTASSIUM CHLORIDE 20 MEQ/15ML (10%) PO LIQD
40.0000 meq | Freq: Once | ORAL | Status: AC
  Administered 2012-12-10: 40 meq
  Filled 2012-12-10: qty 30

## 2012-12-10 MED ORDER — SODIUM CHLORIDE 0.45 % IV SOLN
INTRAVENOUS | Status: DC
  Administered 2012-12-13 – 2012-12-16 (×3): 10 mL/h via INTRAVENOUS

## 2012-12-10 NOTE — Progress Notes (Signed)
PULMONARY  / CRITICAL CARE MEDICINE  Name: Juan Glenn MRN: [id number] DOB: 03/24/1955    ADMISSION DATE:  12/05/2012 CONSULTATION DATE:  11/36/2014  REFERRING MD :  EDP PRIMARY SERVICE: PCCM  CHIEF COMPLAINT:  Seizure and HTN  BRIEF PATIENT DESCRIPTION: 57 yo with history of PRES syndrome 2 months ago admitted with hypertensive crisis, encephalopathy and possible aspiration.  SIGNIFICANT EVENTS / STUDIES:  11/6   Admitted with hypertensive crisis, encephalopathy and possible aspiration 11/6   TTE >>> EF 60%, no effusion 11/8   Self extubated, reintubated 11/11 New fever, septic workup sent  LINES / TUBES: ETT 11/6 >>>11/8 (self extubated) >>> 11/8>>> OGT 11/6 >>> R IJ TLC 11/6 >>> Foley 11/6 >>>  CULTURES: Blood 11/6 >>> neg Urine 11/6 >>> neg Sputum 11/6 >>> few GPC/few candida >>> Blood 11/10 >>> Urine 11/10 >>> Sputum 11/10 >>>  ANTIBIOTICS: Unasyn 11/6 >>> Vancomycin 11/6 >>>11/9  SUBJECTIVE:  Fever overnight. Septic workup sent by eMD. Labile BG.  VITAL SIGNS: Temp:  [97.8 F (36.6 C)-102.9 F (39.4 C)] 102.9 F (39.4 C) (11/11 0635) Pulse Rate:  [102-126] 109 (11/11 0737) Resp:  [21-40] 28 (11/11 0737) BP: (141-199)/(76-110) 172/90 mmHg (11/11 0737) SpO2:  [0 %-98 %] 96 % (11/11 0737) FiO2 (%):  [50 %] 50 % (11/11 0737) Weight:  [99.2 kg (218 lb 11.1 oz)] 99.2 kg (218 lb 11.1 oz) (11/11 0600)  HEMODYNAMICS: CVP:  [7 mmHg-9 mmHg] 7 mmHg  VENTILATOR SETTINGS: Vent Mode:  [-] PRVC FiO2 (%):  [50 %] 50 % Set Rate:  [14 bmp] 14 bmp Vt Set:  [570 mL] 570 mL PEEP:  [5 cmH20] 5 cmH20 Plateau Pressure:  [18 cmH20-28 cmH20] 20 cmH20  INTAKE / OUTPUT: Intake/Output     11/10 0701 - 11/11 0700 11/11 0701 - 11/12 0700   I.V. (mL/kg) 1849 (18.6) 74.5 (0.8)   NG/GT 2810 70   IV Piggyback 300    Total Intake(mL/kg) 4959 (50) 144.5 (1.5)   Urine (mL/kg/hr) 4620 (1.9)    Total Output 4620     Net +339 +144.5        Stool Occurrence     Emesis  Occurrence       PHYSICAL EXAMINATION: General:  Mechanically ventilated, no disstress Neuro:  Encephalopathic but more responsive, nonfocal, gag / cough present HEENT:  No JVD Cardiovascular:  RRR, no m/r/g Lungs: Bilateral air entry, few rhonchi / rales Abdomen:  Soft, nontender, bowel sounds diminished Musculoskeletal:  Trace edema Skin:  Intact  CBC  Recent Labs Lab 12/08/12 0400 12/09/12 0417 12/10/12 0500  WBC 12.2* 12.6* 10.8*  HGB 11.0* 12.3* 11.5*  HCT 32.3* 35.6* 33.4*  PLT 86* 109* 117*   Coag's  Recent Labs Lab 12/05/12 0020 12/05/12 0545  APTT 30 23*  INR 1.21 1.10   BMET  Recent Labs Lab 12/08/12 0400 12/09/12 0417 12/10/12 0500  NA 147* 149* 150*  K 4.0 3.5 3.6  CL 117* 111 115*  CO2 21 27 26   BUN 15 21 22   CREATININE 1.24 1.29 1.22  GLUCOSE 145* 145* 136*   Electrolytes  Recent Labs Lab 12/05/12 0545  12/06/12 0355 12/07/12 0500 12/08/12 0400 12/09/12 0417 12/10/12 0500  CALCIUM 8.4  < > 5.0* 8.1* 7.6* 8.7 8.5  MG 2.6*  --  1.3* 2.4  --   --   --   PHOS 4.0  --  1.7* 1.9* 2.0*  --   --   < > = values in this interval not  displayed.  Sepsis Markers  Recent Labs Lab 12/06/12 0846 12/06/12 0858 12/06/12 1446 12/06/12 2046 12/07/12 0500 12/08/12 0400  LATICACIDVEN 2.8*  --  3.0* 3.3*  --   --   PROCALCITON  --  59.36  --   --  59.70 38.63   ABG  Recent Labs Lab 12/08/12 0410 12/09/12 0550 12/09/12 1205  PHART 7.338* 7.398 7.403  PCO2ART 40.7 44.4 41.9  PO2ART 84.7 70.9* 67.0*   Liver Enzymes  Recent Labs Lab 12/05/12 0020 12/05/12 0715 12/06/12 1022 12/09/12 0417  AST 40* 45*  --  46*  ALT 37 36  --  26  ALKPHOS 113 70  --  79  BILITOT 0.3 0.5  --  0.4  ALBUMIN 4.5 3.5 2.5* 2.4*   Cardiac Enzymes  Recent Labs Lab 12/05/12 0715  TROPONINI <0.30  PROBNP 85.4   Glucose  Recent Labs Lab 12/10/12 0103 12/10/12 0208 12/10/12 0307 12/10/12 0408 12/10/12 0507 12/10/12 0611  GLUCAP 193* 192*  175* 137* 118* 140*   CXR: 11/10 >>> Persistent bilateral airspace disease  ASSESSMENT / PLAN:  PULMONARY A: Acute respiratory failure in setting of seizure and aspiration.  Aspiration pneumonia / pneumonitis / ?ARDS. P:   Goal pH>7.30, SpO2>92 Continuous mechanical support 6 cc/lg VAP bundle Daily WUA Trend ABG/CXR  CARDIOVASCULAR A:  Hypotension initially.  Now off vasopressors, hypertensive. P:  Goal MAP>85 ASA Labetalol 200 bid Hydralazine PRN  RENAL A:  AKI / Cr plateaued. Metabolic acidosis- resolved, now contraction alkalosis. Hypernatremia.  Hypokalemia. P:   Free water to 300 q4h K 40 x 1 Trend BMP  GASTROINTESTINAL A:  No active issues. P:   TF per Nutrition Protonix for GI Px  HEMATOLOGIC A:  Anemia. Thrombocytopenia - stable. P: Trend CBC Heparin for DVT Px  INFECTIOUS A:  Aspiration pneumonia. New fever. P:   Abx / cx as above  ENDOCRINE A:  DM.  Hyperglycemia. P:   Insulin gtt, do NOT convert to SSI  NEUROLOGIC A:  Seizures in setting of hypertensive emergency.  Recent admission for PRESS. Acute encephalopathy  P:   Neurology following Dilantin 100 q8h (started per Neuro recs) MRI when able Fentanyl / Versed gtt  I have personally obtained history, examined patient, evaluated and interpreted laboratory and imaging results, reviewed medical records, formulated assessment / plan and placed orders.  CRITICAL CARE:  The patient is critically ill with multiple organ systems failure and requires high complexity decision making for assessment and support, frequent evaluation and titration of therapies, application of advanced monitoring technologies and extensive interpretation of multiple databases. Critical Care Time devoted to patient care services described in this note is 35 minutes.   Lonia Farber, MD Pulmonary and Critical Care Medicine [facility] Pager: [phone]  12/10/2012, 8:42 AM

## 2012-12-10 NOTE — Progress Notes (Signed)
NUTRITION FOLLOW UP  Intervention:   1. Enteral nutrition; initiate Vital High Protein @ 20 mL/hr continuous. Advance by 10 mL q 8 hrs to 70 mL/hr goal to provide 1680 kcal, 147g protein, 1404 mL free water.   NUTRITION DIAGNOSIS:  Inadequate oral intake related to inability to eat as evidenced by NPO, vent.   Monitor:  1. Enteral nutrition; initiation with tolerance. Enteral nutrition to provide 60-70% of estimated calorie needs (22-25 kcals/kg ideal body weight) and 100% of estimated protein needs, based on ASPEN guidelines for permissive underfeeding in critically ill obese individuals. Met, ongoing.  2. Wt/wt change; monitor trends.  Ongoing.   Assessment:   Pt admitted with acute respiratory failure. Pt with h/o PRES syndrome 2 months ago.  Pt vomiting yesterday, with aspiration pneumonia.   Patient is currently intubated on ventilator support.  MV: 17.5 L/min Temp:Temp (24hrs), Avg:101 F (38.3 C), Min:97.8 F (36.6 C), Max:102.9 F (39.4 C)  Propofol: none  Pt has reached goal rate of Vital High Protein @ 70 mL/hr with tolerance.  Residuals: 0 mL    Height: Ht Readings from Last 1 Encounters:  12/05/12 5\' 9"  (1.753 m)    Weight Status:   Wt Readings from Last 1 Encounters:  12/10/12 218 lb 11.1 oz (99.2 kg)    Re-estimated needs:  Kcal: 2378  Permissive underfeeding: 1610-9604 kcal/d  Protein: 144g  Fluid: ~2.0 L/day  Skin: generalized edema, WNL  Diet Order: NPO   Intake/Output Summary (Last 24 hours) at 12/10/12 1120 Last data filed at 12/10/12 0900  Gross per 24 hour  Intake 4487.52 ml  Output   4185 ml  Net 302.52 ml    Last BM: 11/9   Labs:   Recent Labs Lab 12/05/12 0545  12/06/12 0355 12/07/12 0500 12/08/12 0400 12/09/12 0417 12/10/12 0500  NA 138  < > 157* 146* 147* 149* 150*  K 5.7*  < > 3.4* 4.4 4.0 3.5 3.6  CL 104  < > 118* 117* 117* 111 115*  CO2 13*  < > 13* 20 21 27 26   BUN 16  < > 14 14 15 21 22   CREATININE 1.20  < >  1.15 1.31 1.24 1.29 1.22  CALCIUM 8.4  < > 5.0* 8.1* 7.6* 8.7 8.5  MG 2.6*  --  1.3* 2.4  --   --   --   PHOS 4.0  --  1.7* 1.9* 2.0*  --   --   GLUCOSE 394*  < > 157* 159* 145* 145* 136*  < > = values in this interval not displayed.  CBG (last 3)   Recent Labs  12/10/12 0408 12/10/12 0507 12/10/12 0611  GLUCAP 137* 118* 140*    Scheduled Meds: . ampicillin-sulbactam (UNASYN) IV  3 g Intravenous Q6H  . antiseptic oral rinse  15 mL Mouth Rinse QID  . aspirin  325 mg Oral Daily  . chlorhexidine  15 mL Mouth Rinse BID  . free water  300 mL Per Tube Q4H  . heparin subcutaneous  5,000 Units Subcutaneous Q8H  . labetalol  200 mg Oral BID  . pantoprazole sodium  40 mg Per Tube Q24H  . phenytoin (DILANTIN) IV  100 mg Intravenous Q8H  . pneumococcal 23 valent vaccine  0.5 mL Intramuscular Tomorrow-1000  . potassium chloride  40 mEq Per Tube Once    Continuous Infusions: . feeding supplement (VITAL HIGH PROTEIN) 1,000 mL (12/09/12 2000)  . fentaNYL infusion INTRAVENOUS 200 mcg/hr (12/09/12 2213)  . insulin (  NOVOLIN-R) infusion 4 Units/hr (12/10/12 1478)  . midazolam (VERSED) infusion 2 mg/hr (12/10/12 0447)    Loyce Dys, MS RD LDN Clinical Inpatient Dietitian Pager: [phone] Weekend/After hours pager: [phone]

## 2012-12-11 ENCOUNTER — Inpatient Hospital Stay (HOSPITAL_COMMUNITY): Payer: BC Managed Care – PPO

## 2012-12-11 DIAGNOSIS — J9600 Acute respiratory failure, unspecified whether with hypoxia or hypercapnia: Secondary | ICD-10-CM

## 2012-12-11 DIAGNOSIS — I10 Essential (primary) hypertension: Secondary | ICD-10-CM

## 2012-12-11 LAB — BASIC METABOLIC PANEL
Calcium: 8.5 mg/dL (ref 8.4–10.5)
Chloride: 112 mEq/L (ref 96–112)
GFR calc Af Amer: 79 mL/min — ABNORMAL LOW (ref >90)
GFR calc non Af Amer: 68 mL/min — ABNORMAL LOW (ref >90)
Glucose, Bld: 161 mg/dL — ABNORMAL HIGH (ref 70–99)
Potassium: 3.8 mEq/L (ref 3.5–5.1)
Sodium: 147 mEq/L — ABNORMAL HIGH (ref 135–145)

## 2012-12-11 LAB — CBC
MCHC: 33.9 g/dL (ref 30.0–36.0)
Platelets: 142 10*3/uL — ABNORMAL LOW (ref 150–400)
RDW: 13.8 % (ref 11.5–15.5)
WBC: 12.8 10*3/uL — ABNORMAL HIGH (ref 4.0–10.5)

## 2012-12-11 LAB — POCT I-STAT 3, ART BLOOD GAS (G3+)
Acid-base deficit: 1 mmol/L (ref 0.0–2.0)
Bicarbonate: 23.3 mEq/L (ref 20.0–24.0)
Patient temperature: 100.4
pCO2 arterial: 36.7 mmHg (ref 35.0–45.0)
pH, Arterial: 7.415 (ref 7.350–7.450)

## 2012-12-11 LAB — CULTURE, BLOOD (ROUTINE X 2)
Culture: NO GROWTH
Culture: NO GROWTH

## 2012-12-11 LAB — GLUCOSE, CAPILLARY
Glucose-Capillary: 173 mg/dL — ABNORMAL HIGH (ref 70–99)
Glucose-Capillary: 151 mg/dL — ABNORMAL HIGH (ref 70–99)
Glucose-Capillary: 149 mg/dL — ABNORMAL HIGH (ref 70–99)
Glucose-Capillary: 151 mg/dL — ABNORMAL HIGH (ref 70–99)
Glucose-Capillary: 157 mg/dL — ABNORMAL HIGH (ref 70–99)
Glucose-Capillary: 151 mg/dL — ABNORMAL HIGH (ref 70–99)
Glucose-Capillary: 135 mg/dL — ABNORMAL HIGH (ref 70–99)
Glucose-Capillary: 142 mg/dL — ABNORMAL HIGH (ref 70–99)
Glucose-Capillary: 152 mg/dL — ABNORMAL HIGH (ref 70–99)
Glucose-Capillary: 154 mg/dL — ABNORMAL HIGH (ref 70–99)
Glucose-Capillary: 158 mg/dL — ABNORMAL HIGH (ref 70–99)
Glucose-Capillary: 166 mg/dL — ABNORMAL HIGH (ref 70–99)
Glucose-Capillary: 167 mg/dL — ABNORMAL HIGH (ref 70–99)
Glucose-Capillary: 146 mg/dL — ABNORMAL HIGH (ref 70–99)

## 2012-12-11 MED ORDER — METRONIDAZOLE IN NACL 5-0.79 MG/ML-% IV SOLN
500.0000 mg | Freq: Three times a day (TID) | INTRAVENOUS | Status: DC
  Administered 2012-12-11 – 2012-12-15 (×12): 500 mg via INTRAVENOUS
  Filled 2012-12-11 (×15): qty 100

## 2012-12-11 MED ORDER — POTASSIUM CHLORIDE 20 MEQ/15ML (10%) PO LIQD
40.0000 meq | Freq: Once | ORAL | Status: AC
  Administered 2012-12-11: 40 meq
  Filled 2012-12-11: qty 30

## 2012-12-11 MED ORDER — DEXTROSE 5 % IV SOLN
1.0000 g | Freq: Three times a day (TID) | INTRAVENOUS | Status: DC
  Administered 2012-12-11 – 2012-12-13 (×6): 1 g via INTRAVENOUS
  Filled 2012-12-11 (×8): qty 1

## 2012-12-11 MED ORDER — PNEUMOCOCCAL VAC POLYVALENT 25 MCG/0.5ML IJ INJ
0.5000 mL | INJECTION | INTRAMUSCULAR | Status: AC
  Administered 2012-12-16: 0.5 mL via INTRAMUSCULAR
  Filled 2012-12-11: qty 0.5

## 2012-12-11 NOTE — Progress Notes (Signed)
Subjective: Patient has improved in level of responsiveness and alertness. No recurrent seizure activity reported.  Objective: Current vital signs: BP 175/87  Pulse 114  Temp(Src) 99.8 F (37.7 C) (Oral)  Resp 32  Ht 5\' 9"  (1.753 m)  Wt 98.5 kg (217 lb 2.5 oz)  BMI 32.05 kg/m2  SpO2 94%  Neurologic Exam: Awake and alert and following commands appropriately. Patient appears to be in no distress. He is still intubated and on mechanical ventilation, but breathing spontaneously. Pupils were equal and reacted normally to light. Extraocular movements were full and conjugate. No facial weakness was noted. Moved extremities equally with good strength.  Medications: I have reviewed the patient's current medications.  Assessment/Plan: Encephalopathic state most likely secondary to marked hypertension, with a marked improvement in mental status. Patient has had no recurrent seizures and is currently on Dilantin 100 mg every 8 hours.  No changes in current management recommended. We'll have his Dilantin level checked today. We will see him in followup following extubation.  C.R. Roseanne Reno, MD [facility] [phone]  12/11/2012  9:55 AM

## 2012-12-11 NOTE — Progress Notes (Signed)
ANTIBIOTIC CONSULT NOTE - FOLLOW UP  Pharmacy Consult for Unasyn > change to cefipime and flagyl Indication: pneumonia  No Known Allergies  Patient Measurements: Height: 5\' 9"  (175.3 cm) Weight: 217 lb 2.5 oz (98.5 kg) IBW/kg (Calculated) : 70.7  Vital Signs: Temp: 99.8 F (37.7 C) (11/12 0800) Temp src: Oral (11/12 0713) BP: 175/87 mmHg (11/12 0801) Pulse Rate: 114 (11/12 0801) Intake/Output from previous day: 11/11 0701 - 11/12 0700 In: 4963.7 [I.V.:1323.7; NG/GT:2940; IV Piggyback:400] Out: 3500 [Urine:3500] Intake/Output from this shift: Total I/O In: 644 [I.V.:104; NG/GT:440; IV Piggyback:100] Out: 850 [Urine:850]  Labs:  Recent Labs  12/09/12 0417 12/10/12 0500 12/11/12 0500  WBC 12.6* 10.8* 12.8*  HGB 12.3* 11.5* 11.4*  PLT 109* 117* 142*  CREATININE 1.29 1.22 1.16   Estimated Creatinine Clearance: 81.3 ml/min (by C-G formula based on Cr of 1.16). No results found for this basename: Rolm Gala, VANCORANDOM, GENTTROUGH, GENTPEAK, GENTRANDOM, TOBRATROUGH, TOBRAPEAK, TOBRARND, AMIKACINPEAK, AMIKACINTROU, AMIKACIN,  in the last 72 hours   Microbiology: Recent Results (from the past 720 hour(s))  URINE CULTURE     Status: None   Collection Time    12/05/12 12:41 AM      Result Value Range Status   Specimen Description URINE, RANDOM   Final   Special Requests Normal   Final   Culture  Setup Time     Final   Value: 12/05/2012 02:45     Performed at [facility] Count     Final   Value: NO GROWTH     Performed at [facility]   Culture     Final   Value: NO GROWTH     Performed at [facility]   Report Status 12/06/2012 FINAL   Final  MRSA PCR SCREENING     Status: None   Collection Time    12/05/12  4:47 AM      Result Value Range Status   MRSA by PCR NEGATIVE  NEGATIVE Final   Comment:            The GeneXpert MRSA Assay (FDA     approved for NASAL specimens     only), is one component of a   comprehensive MRSA colonization     surveillance program. It is not     intended to diagnose MRSA     infection nor to guide or     monitor treatment for     MRSA infections.  CULTURE, BLOOD (ROUTINE X 2)     Status: None   Collection Time    12/05/12  5:30 AM      Result Value Range Status   Specimen Description BLOOD RIGHT HAND   Final   Special Requests BOTTLES DRAWN AEROBIC ONLY 2CC   Final   Culture  Setup Time     Final   Value: 12/05/2012 09:24     Performed at [facility]   Culture     Final   Value: NO GROWTH 5 DAYS     Performed at [facility]   Report Status 12/11/2012 FINAL   Final  CULTURE, BLOOD (ROUTINE X 2)     Status: None   Collection Time    12/05/12  5:45 AM      Result Value Range Status   Specimen Description BLOOD RIGHT HAND   Final   Special Requests BOTTLES DRAWN AEROBIC ONLY 3CC   Final   Culture  Setup Time  Final   Value: 12/05/2012 09:24     Performed at [facility]   Culture     Final   Value: NO GROWTH 5 DAYS     Performed at [facility]   Report Status 12/11/2012 FINAL   Final  URINE CULTURE     Status: None   Collection Time    12/05/12  8:44 AM      Result Value Range Status   Specimen Description URINE, CATHETERIZED   Final   Special Requests NONE   Final   Culture  Setup Time     Final   Value: 12/05/2012 16:14     Performed at [facility] Count     Final   Value: NO GROWTH     Performed at [facility]   Culture     Final   Value: NO GROWTH     Performed at [facility]   Report Status 12/06/2012 FINAL   Final  CULTURE, RESPIRATORY (NON-EXPECTORATED)     Status: None   Collection Time    12/05/12  7:49 PM      Result Value Range Status   Specimen Description TRACHEAL ASPIRATE   Final   Special Requests NONE   Final   Gram Stain     Final   Value: ABUNDANT WBC PRESENT, PREDOMINANTLY PMN     RARE SQUAMOUS EPITHELIAL CELLS PRESENT     FEW GRAM POSITIVE  COCCI     IN PAIRS RARE YEAST     Performed at [facility]   Culture     Final   Value: FEW CANDIDA ALBICANS     Performed at [facility]   Report Status 12/08/2012 FINAL   Final  URINE CULTURE     Status: None   Collection Time    12/09/12  4:57 PM      Result Value Range Status   Specimen Description URINE, CATHETERIZED   Final   Special Requests NONE   Final   Culture  Setup Time     Final   Value: 12/09/2012 17:24     Performed at [facility]   Culture     Final   Value: NO GROWTH     Performed at [facility]   Report Status 12/10/2012 FINAL   Final  CULTURE, BLOOD (ROUTINE X 2)     Status: None   Collection Time    12/09/12  7:37 PM      Result Value Range Status   Specimen Description BLOOD ARM LEFT   Final   Special Requests BOTTLES DRAWN AEROBIC ONLY 2CC   Final   Culture  Setup Time     Final   Value: 12/10/2012 01:10     Performed at [facility]   Culture     Final   Value:        BLOOD CULTURE RECEIVED NO GROWTH TO DATE CULTURE WILL BE HELD FOR 5 DAYS BEFORE ISSUING A FINAL NEGATIVE REPORT     Performed at [facility]   Report Status PENDING   Incomplete  CULTURE, BLOOD (ROUTINE X 2)     Status: None   Collection Time    12/09/12  7:42 PM      Result Value Range Status   Specimen Description BLOOD HAND LEFT   Final   Special Requests BOTTLES DRAWN AEROBIC ONLY 3CC   Final   Culture  Setup Time  Final   Value: 12/10/2012 01:10     Performed at [facility]   Culture     Final   Value:        BLOOD CULTURE RECEIVED NO GROWTH TO DATE CULTURE WILL BE HELD FOR 5 DAYS BEFORE ISSUING A FINAL NEGATIVE REPORT     Performed at [facility]   Report Status PENDING   Incomplete  CULTURE, RESPIRATORY (NON-EXPECTORATED)     Status: None   Collection Time    12/09/12  8:14 PM      Result Value Range Status   Specimen Description TRACHEAL ASPIRATE   Final   Special Requests NONE   Final   Gram  Stain     Final   Value: ABUNDANT WBC PRESENT,BOTH PMN AND MONONUCLEAR     RARE SQUAMOUS EPITHELIAL CELLS PRESENT     NO ORGANISMS SEEN     Performed at [facility]   Culture     Final   Value: FEW GRAM NEGATIVE RODS     Performed at [facility]   Report Status PENDING   Incomplete    Anti-infectives   Start     Dose/Rate Route Frequency Ordered Stop   12/08/12 1600  vancomycin (VANCOCIN) 1,250 mg in sodium chloride 0.9 % 250 mL IVPB  Status:  Discontinued     1,250 mg 166.7 mL/hr over 90 Minutes Intravenous Every 8 hours 12/08/12 0831 12/08/12 0859   12/05/12 1900  vancomycin (VANCOCIN) 1,250 mg in sodium chloride 0.9 % 250 mL IVPB  Status:  Discontinued     1,250 mg 166.7 mL/hr over 90 Minutes Intravenous Every 12 hours 12/05/12 1801 12/08/12 0831   12/05/12 0400  Ampicillin-Sulbactam (UNASYN) 3 g in sodium chloride 0.9 % 100 mL IVPB     3 g 100 mL/hr over 60 Minutes Intravenous Every 6 hours 12/05/12 0317       57 y.o. 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 Pharmacy consulted to dose unasyn/vanc > ongoing fever 11/12 change to cefepime and flagyl  PMH: DM, renal disorder, HLD, HTN  Events: tachypnic with wean attempt this am,   Anticoagulation/Heme: none pta, sq heparin for px; CBC low, stable  Infectious Disease: Asp PNA, TM 102.9 - continues to spike, WBC initially 30.8, now 12.8, PCT 59>38.6 11/6 Unasyn>>11./12 11/6 Vanc>> 11/9 11.12 cefepime 11.12 flagyl  CXR: 11/11 ongoing L worse than R airspace disease 11/10 Respiratory few GNR 11/10, blood x2,  11/10 urine>>neg final 11/6 blood>> neg final 11/6 urine>> neg final 11/6 resp>> few gram pos cocci, few candida albicans (f)  Nephrology: CrCl 80 ml/min, good UOP   PTA Medication Issues: Home Meds Not Ordered: asa, glimeperide, linagliptin/metformin, claritin, crestor, mvi, (? Losartan)  Best Practices: sq heparin,  ppi, mouth care  Goal of Therapy:  Renal adjustment of antibiotics.  Plan:  DC Unasyn Flagyl 500 mg IV q8h Cefepime 1g iv Q8h Follow up SCr, UOP, cultures, clinical course and adjust as clinically indicated.    Thank you for allowing pharmacy to be a part of this patients care team.  Lovenia Kim Pharm.D., BCPS Clinical Pharmacist 12/11/2012 9:36 AM Pager: [phone] Phone: [phone]

## 2012-12-11 NOTE — Progress Notes (Signed)
Attempted to wean pt on psv.  Pt RR increased to 40, attempted to increase psv up to 24, however pt remained tachypnic= RR 39-40.  RN and Dr. Marin Shutter aware.

## 2012-12-11 NOTE — Progress Notes (Signed)
PULMONARY  / CRITICAL CARE MEDICINE  Name: Juan Glenn MRN: [id number] DOB: 09-15-1955    ADMISSION DATE:  12/05/2012 CONSULTATION DATE:  11/36/2014  REFERRING MD :  EDP PRIMARY SERVICE: PCCM  CHIEF COMPLAINT:  Seizure and HTN  BRIEF PATIENT DESCRIPTION: 57 yo with history of PRES syndrome 2 months ago admitted with hypertensive crisis, encephalopathy and possible aspiration.  SIGNIFICANT EVENTS / STUDIES:  11/6   Admitted with hypertensive crisis, encephalopathy and possible aspiration 11/6   TTE >>> EF 60%, no effusion 11/8   Self extubated, reintubated 11/11 New fever, septic workup sent  LINES / TUBES: ETT 11/6 >>>11/8 (self extubated) >>> 11/8>>> OGT 11/6 >>> R IJ TLC 11/6 >>> Foley 11/6 >>>  CULTURES: Blood 11/6 >>> neg Urine 11/6 >>> neg Sputum 11/6 >>> CANDIDA Blood 11/10 >>> Urine 11/10 >>> Sputum 11/10 >>> GNR >>>  ANTIBIOTICS: Unasyn 11/6 >>> 11/12 Vancomycin 11/6 >>>11/9 Cefepime 11/12 >>> Flagyl 11/12 >>>  SUBJECTIVE:  Febrile. RN concerned for tachypnea in spite of appropriate sedation.  VITAL SIGNS: Temp:  [98 F (36.7 C)-102.1 F (38.9 C)] 99.8 F (37.7 C) (11/12 0800) Pulse Rate:  [99-115] 114 (11/12 0801) Resp:  [28-35] 32 (11/12 0801) BP: (129-188)/(69-110) 175/87 mmHg (11/12 0801) SpO2:  [91 %-100 %] 94 % (11/12 0801) FiO2 (%):  [40 %-50 %] 40 % (11/12 0815) Weight:  [98.5 kg (217 lb 2.5 oz)] 98.5 kg (217 lb 2.5 oz) (11/12 0318)  HEMODYNAMICS: CVP:  [5 mmHg-10 mmHg] 10 mmHg  VENTILATOR SETTINGS: Vent Mode:  [-] PRVC FiO2 (%):  [40 %-50 %] 40 % Set Rate:  [14 bmp] 14 bmp Vt Set:  [570 mL] 570 mL PEEP:  [5 cmH20] 5 cmH20 Pressure Support:  [15 cmH20-24 cmH20] 24 cmH20 Plateau Pressure:  [19 cmH20-26 cmH20] 25 cmH20  INTAKE / OUTPUT: Intake/Output     11/11 0701 - 11/12 0700 11/12 0701 - 11/13 0700   I.V. (mL/kg) 1323.7 (13.4) 104 (1.1)   Other 300    NG/GT 2940 440   IV Piggyback 400 100   Total Intake(mL/kg) 4963.7  (50.4) 644 (6.5)   Urine (mL/kg/hr) 3500 (1.5) 850 (3.3)   Total Output 3500 850   Net +1463.7 -206          PHYSICAL EXAMINATION: General:  Mechanically ventilated, synchronous, tachypneic  Neuro:  Encephalopathic, arouses to stimulation, nonfocal, gag / cough present HEENT:  No JVD Cardiovascular:  RRR, no m/r/g Lungs: Bilateral air entry, scattered rhonchi, brown thick secretions via ETT Abdomen:  Soft, nontender, bowel sounds diminished Musculoskeletal:  Trace edema Skin:  Intact  CBC  Recent Labs Lab 12/09/12 0417 12/10/12 0500 12/11/12 0500  WBC 12.6* 10.8* 12.8*  HGB 12.3* 11.5* 11.4*  HCT 35.6* 33.4* 33.6*  PLT 109* 117* 142*   Coag's  Recent Labs Lab 12/05/12 0020 12/05/12 0545  APTT 30 23*  INR 1.21 1.10   BMET  Recent Labs Lab 12/09/12 0417 12/10/12 0500 12/11/12 0500  NA 149* 150* 147*  K 3.5 3.6 3.8  CL 111 115* 112  CO2 27 26 23   BUN 21 22 22   CREATININE 1.29 1.22 1.16  GLUCOSE 145* 136* 161*   Electrolytes  Recent Labs Lab 12/05/12 0545  12/06/12 0355 12/07/12 0500 12/08/12 0400 12/09/12 0417 12/10/12 0500 12/11/12 0500  CALCIUM 8.4  < > 5.0* 8.1* 7.6* 8.7 8.5 8.5  MG 2.6*  --  1.3* 2.4  --   --   --   --   PHOS 4.0  --  1.7* 1.9* 2.0*  --   --   --   < > = values in this interval not displayed.  Sepsis Markers  Recent Labs Lab 12/06/12 0846 12/06/12 0858 12/06/12 1446 12/06/12 2046 12/07/12 0500 12/08/12 0400  LATICACIDVEN 2.8*  --  3.0* 3.3*  --   --   PROCALCITON  --  59.36  --   --  59.70 38.63   ABG  Recent Labs Lab 12/08/12 0410 12/09/12 0550 12/09/12 1205  PHART 7.338* 7.398 7.403  PCO2ART 40.7 44.4 41.9  PO2ART 84.7 70.9* 67.0*   Liver Enzymes  Recent Labs Lab 12/05/12 0020 12/05/12 0715 12/06/12 1022 12/09/12 0417  AST 40* 45*  --  46*  ALT 37 36  --  26  ALKPHOS 113 70  --  79  BILITOT 0.3 0.5  --  0.4  ALBUMIN 4.5 3.5 2.5* 2.4*   Cardiac Enzymes  Recent Labs Lab 12/05/12 0715   TROPONINI <0.30  PROBNP 85.4   Glucose  Recent Labs Lab 12/11/12 0214 12/11/12 0324 12/11/12 0424 12/11/12 0512 12/11/12 0616 12/11/12 0716  GLUCAP 151* 149* 151* 157* 156* 151*   CXR: 11/12 >>> Bilateral airspace disease R>L  ASSESSMENT / PLAN:  PULMONARY A: Acute respiratory failure in setting of seizure and aspiration.  Aspiration pneumonia, less likely ARDS ( as asymmetric airspace disease). Tachypnea / high minute ventilation, VQ mismatch? P:   Goal pH>7.30, SpO2>92 Continuous mechanical support 6 cc/lg VAP bundle Daily WUA Trend ABG/CXR ABG now  CARDIOVASCULAR A:  HTN. P:  Goal MAP>85 ASA Labetalol 200 bid Hydralazine PRN  RENAL A:  AKI - improving. Metabolic acidosis- resolved, now contraction alkalosis. Hypernatremia - improving.  Hypokalemia - mild. P:   Free water 300 q4h K 40 x 1 Trend BMP  GASTROINTESTINAL A:  No active issues. P:   TF per Nutrition Protonix for GI Px  HEMATOLOGIC A:  Anemia. Thrombocytopenia - stable. P: Trend CBC Heparin for DVT Px  INFECTIOUS A:  Aspiration pneumonia. New fever. GNR in sputum. P:   Widen abx coverage (Cwfepime / Flagyl started)  ENDOCRINE A:  DM.  Hyperglycemia. P:   Insulin gtt, do NOT convert to SSI  NEUROLOGIC A:  Seizures in setting of hypertensive emergency - no seizures since admission.  Recent admission for PRESS. Acute encephalopathy  P:   Neurology following Dilantin 100 q8h (started per Neuro recs) MRI when able Fentanyl / Versed gtt  I have personally obtained history, examined patient, evaluated and interpreted laboratory and imaging results, reviewed medical records, formulated assessment / plan and placed orders.  CRITICAL CARE:  The patient is critically ill with multiple organ systems failure and requires high complexity decision making for assessment and support, frequent evaluation and titration of therapies, application of advanced monitoring technologies and extensive  interpretation of multiple databases. Critical Care Time devoted to patient care services described in this note is 45 minutes.   Lonia Farber, MD Pulmonary and Critical Care Medicine [facility] Pager: [phone]  12/11/2012, 9:39 AM

## 2012-12-12 ENCOUNTER — Inpatient Hospital Stay (HOSPITAL_COMMUNITY): Payer: BC Managed Care – PPO

## 2012-12-12 LAB — BASIC METABOLIC PANEL
CO2: 21 mEq/L (ref 19–32)
Chloride: 112 mEq/L (ref 96–112)
Creatinine, Ser: 1.13 mg/dL (ref 0.50–1.35)
GFR calc Af Amer: 81 mL/min — ABNORMAL LOW (ref >90)
Potassium: 4.1 mEq/L (ref 3.5–5.1)
Sodium: 145 mEq/L (ref 135–145)

## 2012-12-12 LAB — GLUCOSE, CAPILLARY
Glucose-Capillary: 158 mg/dL — ABNORMAL HIGH (ref 70–99)
Glucose-Capillary: 180 mg/dL — ABNORMAL HIGH (ref 70–99)
Glucose-Capillary: 157 mg/dL — ABNORMAL HIGH (ref 70–99)
Glucose-Capillary: 182 mg/dL — ABNORMAL HIGH (ref 70–99)
Glucose-Capillary: 140 mg/dL — ABNORMAL HIGH (ref 70–99)
Glucose-Capillary: 145 mg/dL — ABNORMAL HIGH (ref 70–99)
Glucose-Capillary: 160 mg/dL — ABNORMAL HIGH (ref 70–99)
Glucose-Capillary: 143 mg/dL — ABNORMAL HIGH (ref 70–99)
Glucose-Capillary: 177 mg/dL — ABNORMAL HIGH (ref 70–99)
Glucose-Capillary: 147 mg/dL — ABNORMAL HIGH (ref 70–99)
Glucose-Capillary: 164 mg/dL — ABNORMAL HIGH (ref 70–99)
Glucose-Capillary: 178 mg/dL — ABNORMAL HIGH (ref 70–99)
Glucose-Capillary: 190 mg/dL — ABNORMAL HIGH (ref 70–99)
Glucose-Capillary: 145 mg/dL — ABNORMAL HIGH (ref 70–99)
Glucose-Capillary: 158 mg/dL — ABNORMAL HIGH (ref 70–99)

## 2012-12-12 LAB — CBC
HCT: 34.5 % — ABNORMAL LOW (ref 39.0–52.0)
Platelets: 170 10*3/uL (ref 150–400)
RBC: 3.96 MIL/uL — ABNORMAL LOW (ref 4.22–5.81)
RDW: 13.8 % (ref 11.5–15.5)
WBC: 12.9 10*3/uL — ABNORMAL HIGH (ref 4.0–10.5)

## 2012-12-12 LAB — PHENYTOIN LEVEL, TOTAL: Phenytoin Lvl: <2.5 ug/mL — ABNORMAL LOW (ref 10.0–20.0)

## 2012-12-12 LAB — CULTURE, RESPIRATORY W GRAM STAIN

## 2012-12-12 LAB — PHENYTOIN LEVEL, FREE AND TOTAL
Phenytoin Bound: 5.9 mg/L
Phenytoin, Total: 7.8 mg/L — ABNORMAL LOW (ref 10.0–20.0)

## 2012-12-12 LAB — CULTURE, RESPIRATORY

## 2012-12-12 MED ORDER — FUROSEMIDE 10 MG/ML IJ SOLN
40.0000 mg | Freq: Two times a day (BID) | INTRAMUSCULAR | Status: AC
  Administered 2012-12-12 (×2): 40 mg via INTRAVENOUS
  Filled 2012-12-12: qty 4

## 2012-12-12 NOTE — Progress Notes (Signed)
While RN and wife at bedside. Bilateral soft wrist restraints were taken off pt to evaluate need for further restraints. Pt. Immediately began to try to pull ETT and foley cath. Pt. Reoriented and given reason why he has tubes.

## 2012-12-12 NOTE — Progress Notes (Addendum)
Subjective: No recurrence of seizure activity reported. Patient had no complaints.  Objective: Current vital signs: BP 135/61  Pulse 100  Temp(Src) 100.2 F (37.9 C) (Rectal)  Resp 27  Ht 5\' 9"  (1.753 m)  Wt 99.1 kg (218 lb 7.6 oz)  BMI 32.25 kg/m2  SpO2 96%  Neurologic Exam: Alert and in no acute distress. Patient was intubated and on mechanical ventilation. He was able to follow commands well. Extraocular movements were full and conjugate. No facial weakness was noted. Patient moved extremities equally with good strength throughout.  Dilantin level today was < 2.5 (undetectable).  Medications: I have reviewed the patient's current medications.  Assessment/Plan: Encephalopathy likely secondary to marked hypertension. Patient's mental status has markedly improved. He has not had a recurrent seizure and Dilantin level is undetectable. Seizure activity was most likely secondary to hypertensive encephalopathy.  Since patient is seizure-free with an undetectable level of Dilantin,  I will discontinue Dilantin and obtain an EEG to rule out signs of increased risk of recurrent seizure activity. I will not resume Dilantin if EEG is unremarkable and patient continues to the seizure-free.  C.R. Roseanne Reno, MD [facility] [phone]  12/12/2012  9:55 AM

## 2012-12-12 NOTE — Progress Notes (Signed)
Noted pt to be tachypnic, slight increased WOB.  Placed pt back on full vent/rest mode.  RN, MD aware.

## 2012-12-12 NOTE — Progress Notes (Signed)
PULMONARY  / CRITICAL CARE MEDICINE  Name: Juan Glenn MRN: [id number] DOB: [birth year]    ADMISSION DATE:  12/05/2012 CONSULTATION DATE:  11/36/2014  REFERRING MD :  EDP PRIMARY SERVICE: PCCM  CHIEF COMPLAINT:  Seizure and HTN  BRIEF PATIENT DESCRIPTION: 57 yo with history of PRES syndrome 2 months ago admitted with hypertensive crisis, encephalopathy and possible aspiration.  SIGNIFICANT EVENTS / STUDIES:  11/6   Admitted with hypertensive crisis, encephalopathy and possible aspiration 11/6   TTE >>> EF 60%, no effusion 11/8   Self extubated, reintubated 11/11 New fever, septic workup sent 11/13 Antibiotics switched  LINES / TUBES: ETT 11/6 >>>11/8 (self extubated) >>> 11/8>>> OGT 11/6 >>> R IJ TLC 11/6 >>> Foley 11/6 >>>  CULTURES: Blood 11/6 >>> neg Urine 11/6 >>> neg Sputum 11/6 >>> CANDIDA Blood 11/10 >>> Urine 11/10 >>> neg Sputum 11/10 >>> GNR >>>ENTEROBACTER AEROGENES  ANTIBIOTICS: Unasyn 11/6 >>> 11/12 Vancomycin 11/6 >>>11/9 Cefepime 11/12 >>> Flagyl 11/12 >>>  SUBJECTIVE:  NO acute events overnight.  VITAL SIGNS: Temp:  [99.8 F (37.7 C)-100.8 F (38.2 C)] 100.2 F (37.9 C) (11/13 0730) Pulse Rate:  [97-116] 102 (11/13 0700) Resp:  [28-34] 29 (11/13 0700) BP: (126-196)/(60-90) 155/78 mmHg (11/13 0700) SpO2:  [92 %-99 %] 98 % (11/13 0700) FiO2 (%):  [40 %-50 %] 50 % (11/13 0313) Weight:  [99.1 kg (218 lb 7.6 oz)] 99.1 kg (218 lb 7.6 oz) (11/13 0405)  HEMODYNAMICS: CVP:  [7 mmHg] 7 mmHg  VENTILATOR SETTINGS: Vent Mode:  [-] PRVC FiO2 (%):  [40 %-50 %] 50 % Set Rate:  [14 bmp] 14 bmp Vt Set:  [570 mL] 570 mL PEEP:  [5 cmH20] 5 cmH20 Plateau Pressure:  [13 cmH20-25 cmH20] 20 cmH20  INTAKE / OUTPUT: Intake/Output     11/12 0701 - 11/13 0700 11/13 0701 - 11/14 0700   I.V. (mL/kg) 1249.8 (12.6)    Other     NG/GT 2140    IV Piggyback 500    Total Intake(mL/kg) 3889.8 (39.3)    Urine (mL/kg/hr) 3235 (1.4)    Total Output 3235     Net +654.8            PHYSICAL EXAMINATION: General:  Mechanically ventilated, synchronous, high Ve (16L) on SBT Neuro:  Encephalopathic, arouses to stimulation, nonfocal, gag / cough present HEENT:  No JVD Cardiovascular:  RRR, no m/r/g Lungs: Bilateral air entry, rhinchi Abdomen:  Soft, nontender, bowel sounds diminished Musculoskeletal:  Trace edema upper extremites Skin:  Intact  CBC  Recent Labs Lab 12/10/12 0500 12/11/12 0500 12/12/12 0425  WBC 10.8* 12.8* 12.9*  HGB 11.5* 11.4* 11.7*  HCT 33.4* 33.6* 34.5*  PLT 117* 142* 170   Coag's No results found for this basename: APTT, INR,  in the last 168 hours BMET  Recent Labs Lab 12/10/12 0500 12/11/12 0500 12/12/12 0425  NA 150* 147* 145  K 3.6 3.8 4.1  CL 115* 112 112  CO2 26 23 21   BUN 22 22 25*  CREATININE 1.22 1.16 1.13  GLUCOSE 136* 161* 152*   Electrolytes  Recent Labs Lab 12/06/12 0355 12/07/12 0500 12/08/12 0400  12/10/12 0500 12/11/12 0500 12/12/12 0425  CALCIUM 5.0* 8.1* 7.6*  < > 8.5 8.5 8.7  MG 1.3* 2.4  --   --   --   --   --   PHOS 1.7* 1.9* 2.0*  --   --   --   --   < > = values in this interval  not displayed.  Sepsis Markers  Recent Labs Lab 12/06/12 0846 12/06/12 0858 12/06/12 1446 12/06/12 2046 12/07/12 0500 12/08/12 0400  LATICACIDVEN 2.8*  --  3.0* 3.3*  --   --   PROCALCITON  --  59.36  --   --  59.70 38.63   ABG  Recent Labs Lab 12/09/12 0550 12/09/12 1205 12/11/12 1020  PHART 7.398 7.403 7.415  PCO2ART 44.4 41.9 36.7  PO2ART 70.9* 67.0* 92.0   Liver Enzymes  Recent Labs Lab 12/06/12 1022 12/09/12 0417  AST  --  46*  ALT  --  26  ALKPHOS  --  79  BILITOT  --  0.4  ALBUMIN 2.5* 2.4*   Cardiac Enzymes No results found for this basename: TROPONINI, PROBNP,  in the last 168 hours Glucose  Recent Labs Lab 12/12/12 0107 12/12/12 0207 12/12/12 0311 12/12/12 0409 12/12/12 0514 12/12/12 0618  GLUCAP 152* 190* 182* 140* 145* 160*   CXR: 11/13 >>>  Bilateral airspace disease R>L, somewhat improved  ASSESSMENT / PLAN:  PULMONARY A: Acute respiratory failure in setting of seizure and aspiration.  Aspiration pneumonia, less likely ARDS ( as asymmetric airspace disease). Tachypnea / high minute ventilation, VQ mismatch? P:   Goal pH>7.30, SpO2>92 Continuous mechanical support 6 cc/lg VAP bundle Daily WUA Trend ABG/CXR  CARDIOVASCULAR A:  HTN. P:  Goal MAP>85 ASA Labetalol 200 bid Hydralazine PRN  RENAL A:  AKI - improving. Metabolic acidosis- resolved, now contraction alkalosis. Hypernatremia - improving.  Positive fluid balance (stay +7L). P:   D/c ree water Lasix 40 q12h x 24 h Trend BMP  GASTROINTESTINAL A:  No active issues. P:   TF per Nutrition Protonix for GI Px  HEMATOLOGIC A:  Anemia. Thrombocytopenia - stable. P: Trend CBC Heparin for DVT Px  INFECTIOUS A:  Aspiration pneumonia (Enterobacter) P:   Abx / cultures as above Hesitant to narrow coverage as brake through fever while on Unasyn   ENDOCRINE A:  DM.  Hyperglycemia. P:   Insulin gtt, do NOT convert to SSI  NEUROLOGIC A:  Seizures in setting of hypertensive emergency - no seizures since admission.  Recent admission for PRESS. Acute encephalopathy  P:   Neurology following Dilantin 100 q8h (started per Neuro recs) MRI when able Fentanyl / Versed gtt  I have personally obtained history, examined patient, evaluated and interpreted laboratory and imaging results, reviewed medical records, formulated assessment / plan and placed orders.  CRITICAL CARE:  The patient is critically ill with multiple organ systems failure and requires high complexity decision making for assessment and support, frequent evaluation and titration of therapies, application of advanced monitoring technologies and extensive interpretation of multiple databases. Critical Care Time devoted to patient care services described in this note is 45 minutes.   Lonia Farber, MD Pulmonary and Critical Care Medicine [facility] Pager: [phone]  12/12/2012, 8:47 AM

## 2012-12-12 NOTE — Progress Notes (Signed)
Bilateral wrist restraints re initiated due to pt.s continued attempts at pulling his lines. Despite reorientation, education and family at bedside.

## 2012-12-13 ENCOUNTER — Inpatient Hospital Stay (HOSPITAL_COMMUNITY): Payer: BC Managed Care – PPO

## 2012-12-13 LAB — BASIC METABOLIC PANEL
BUN: 29 mg/dL — ABNORMAL HIGH (ref 6–23)
Creatinine, Ser: 1.15 mg/dL (ref 0.50–1.35)
GFR calc Af Amer: 79 mL/min — ABNORMAL LOW (ref >90)
GFR calc non Af Amer: 68 mL/min — ABNORMAL LOW (ref >90)
Glucose, Bld: 217 mg/dL — ABNORMAL HIGH (ref 70–99)
Potassium: 4.1 mEq/L (ref 3.5–5.1)

## 2012-12-13 LAB — CBC
HCT: 34.9 % — ABNORMAL LOW (ref 39.0–52.0)
Hemoglobin: 11.8 g/dL — ABNORMAL LOW (ref 13.0–17.0)
MCHC: 33.8 g/dL (ref 30.0–36.0)
MCV: 86.2 fL (ref 78.0–100.0)
Platelets: 222 10*3/uL (ref 150–400)
RDW: 13.6 % (ref 11.5–15.5)

## 2012-12-13 LAB — GLUCOSE, CAPILLARY
Glucose-Capillary: 210 mg/dL — ABNORMAL HIGH (ref 70–99)
Glucose-Capillary: 232 mg/dL — ABNORMAL HIGH (ref 70–99)
Glucose-Capillary: 201 mg/dL — ABNORMAL HIGH (ref 70–99)
Glucose-Capillary: 200 mg/dL — ABNORMAL HIGH (ref 70–99)
Glucose-Capillary: 195 mg/dL — ABNORMAL HIGH (ref 70–99)
Glucose-Capillary: 142 mg/dL — ABNORMAL HIGH (ref 70–99)
Glucose-Capillary: 133 mg/dL — ABNORMAL HIGH (ref 70–99)
Glucose-Capillary: 112 mg/dL — ABNORMAL HIGH (ref 70–99)
Glucose-Capillary: 109 mg/dL — ABNORMAL HIGH (ref 70–99)
Glucose-Capillary: 152 mg/dL — ABNORMAL HIGH (ref 70–99)
Glucose-Capillary: 157 mg/dL — ABNORMAL HIGH (ref 70–99)

## 2012-12-13 MED ORDER — PIPERACILLIN-TAZOBACTAM 3.375 G IVPB
3.3750 g | Freq: Three times a day (TID) | INTRAVENOUS | Status: DC
  Administered 2012-12-13 – 2012-12-18 (×15): 3.375 g via INTRAVENOUS
  Filled 2012-12-13 (×19): qty 50

## 2012-12-13 MED ORDER — LEVOFLOXACIN IN D5W 750 MG/150ML IV SOLN
750.0000 mg | INTRAVENOUS | Status: DC
  Administered 2012-12-13 – 2012-12-15 (×3): 750 mg via INTRAVENOUS
  Filled 2012-12-13 (×4): qty 150

## 2012-12-13 NOTE — Progress Notes (Signed)
ANTIBIOTIC CONSULT NOTE - FOLLOW UP  Pharmacy Consult for Zosyn and Levofloxacin  Indication: pneumonia  No Known Allergies  Patient Measurements: Height: 5\' 9"  (175.3 cm) Weight: 213 lb 13.5 oz (97 kg) IBW/kg (Calculated) : 70.7  Vital Signs: Temp: 100.1 F (37.8 C) (11/14 0814) Temp src: Core (Comment) (11/14 0814) BP: 178/86 mmHg (11/14 0900) Pulse Rate: 103 (11/14 0900) Intake/Output from previous day: 11/13 0701 - 11/14 0700 In: 4112.3 [I.V.:1512.3; NG/GT:2100; IV Piggyback:500] Out: 6375 [Urine:3350; Emesis/NG output:3025] Intake/Output from this shift: Total I/O In: 194 [I.V.:114; NG/GT:80] Out: 350 [Urine:350]  Labs:  Recent Labs  12/11/12 0500 12/12/12 0425 12/13/12 0445  WBC 12.8* 12.9* 15.1*  HGB 11.4* 11.7* 11.8*  PLT 142* 170 222  CREATININE 1.16 1.13 1.15   Estimated Creatinine Clearance: 80.4 ml/min (by C-G formula based on Cr of 1.15). No results found for this basename: Rolm Gala, VANCORANDOM, GENTTROUGH, GENTPEAK, GENTRANDOM, TOBRATROUGH, TOBRAPEAK, TOBRARND, AMIKACINPEAK, AMIKACINTROU, AMIKACIN,  in the last 72 hours   Microbiology: Recent Results (from the past 720 hour(s))  URINE CULTURE     Status: None   Collection Time    12/05/12 12:41 AM      Result Value Range Status   Specimen Description URINE, RANDOM   Final   Special Requests Normal   Final   Culture  Setup Time     Final   Value: 12/05/2012 02:45     Performed at [facility] Count     Final   Value: NO GROWTH     Performed at [facility]   Culture     Final   Value: NO GROWTH     Performed at [facility]   Report Status 12/06/2012 FINAL   Final  MRSA PCR SCREENING     Status: None   Collection Time    12/05/12  4:47 AM      Result Value Range Status   MRSA by PCR NEGATIVE  NEGATIVE Final   Comment:            The GeneXpert MRSA Assay (FDA     approved for NASAL specimens     only), is one component of a      comprehensive MRSA colonization     surveillance program. It is not     intended to diagnose MRSA     infection nor to guide or     monitor treatment for     MRSA infections.  CULTURE, BLOOD (ROUTINE X 2)     Status: None   Collection Time    12/05/12  5:30 AM      Result Value Range Status   Specimen Description BLOOD RIGHT HAND   Final   Special Requests BOTTLES DRAWN AEROBIC ONLY 2CC   Final   Culture  Setup Time     Final   Value: 12/05/2012 09:24     Performed at [facility]   Culture     Final   Value: NO GROWTH 5 DAYS     Performed at [facility]   Report Status 12/11/2012 FINAL   Final  CULTURE, BLOOD (ROUTINE X 2)     Status: None   Collection Time    12/05/12  5:45 AM      Result Value Range Status   Specimen Description BLOOD RIGHT HAND   Final   Special Requests BOTTLES DRAWN AEROBIC ONLY 3CC   Final   Culture  Setup Time  Final   Value: 12/05/2012 09:24     Performed at [facility]   Culture     Final   Value: NO GROWTH 5 DAYS     Performed at [facility]   Report Status 12/11/2012 FINAL   Final  URINE CULTURE     Status: None   Collection Time    12/05/12  8:44 AM      Result Value Range Status   Specimen Description URINE, CATHETERIZED   Final   Special Requests NONE   Final   Culture  Setup Time     Final   Value: 12/05/2012 16:14     Performed at [facility] Count     Final   Value: NO GROWTH     Performed at [facility]   Culture     Final   Value: NO GROWTH     Performed at [facility]   Report Status 12/06/2012 FINAL   Final  CULTURE, RESPIRATORY (NON-EXPECTORATED)     Status: None   Collection Time    12/05/12  7:49 PM      Result Value Range Status   Specimen Description TRACHEAL ASPIRATE   Final   Special Requests NONE   Final   Gram Stain     Final   Value: ABUNDANT WBC PRESENT, PREDOMINANTLY PMN     RARE SQUAMOUS EPITHELIAL CELLS PRESENT     FEW GRAM POSITIVE  COCCI     IN PAIRS RARE YEAST     Performed at [facility]   Culture     Final   Value: FEW CANDIDA ALBICANS     Performed at [facility]   Report Status 12/08/2012 FINAL   Final  URINE CULTURE     Status: None   Collection Time    12/09/12  4:57 PM      Result Value Range Status   Specimen Description URINE, CATHETERIZED   Final   Special Requests NONE   Final   Culture  Setup Time     Final   Value: 12/09/2012 17:24     Performed at [facility]   Culture     Final   Value: NO GROWTH     Performed at [facility]   Report Status 12/10/2012 FINAL   Final  CULTURE, BLOOD (ROUTINE X 2)     Status: None   Collection Time    12/09/12  7:37 PM      Result Value Range Status   Specimen Description BLOOD ARM LEFT   Final   Special Requests BOTTLES DRAWN AEROBIC ONLY 2CC   Final   Culture  Setup Time     Final   Value: 12/10/2012 01:10     Performed at [facility]   Culture     Final   Value:        BLOOD CULTURE RECEIVED NO GROWTH TO DATE CULTURE WILL BE HELD FOR 5 DAYS BEFORE ISSUING A FINAL NEGATIVE REPORT     Performed at [facility]   Report Status PENDING   Incomplete  CULTURE, BLOOD (ROUTINE X 2)     Status: None   Collection Time    12/09/12  7:42 PM      Result Value Range Status   Specimen Description BLOOD HAND LEFT   Final   Special Requests BOTTLES DRAWN AEROBIC ONLY 3CC   Final   Culture  Setup Time  Final   Value: 12/10/2012 01:10     Performed at [facility]   Culture     Final   Value:        BLOOD CULTURE RECEIVED NO GROWTH TO DATE CULTURE WILL BE HELD FOR 5 DAYS BEFORE ISSUING A FINAL NEGATIVE REPORT     Performed at [facility]   Report Status PENDING   Incomplete  CULTURE, RESPIRATORY (NON-EXPECTORATED)     Status: None   Collection Time    12/09/12  8:14 PM      Result Value Range Status   Specimen Description TRACHEAL ASPIRATE   Final   Special Requests NONE   Final   Gram  Stain     Final   Value: ABUNDANT WBC PRESENT,BOTH PMN AND MONONUCLEAR     RARE SQUAMOUS EPITHELIAL CELLS PRESENT     NO ORGANISMS SEEN     Performed at [facility]   Culture     Final   Value: FEW ENTEROBACTER AEROGENES     Performed at [facility]   Report Status 12/12/2012 FINAL   Final   Organism ID, Bacteria ENTEROBACTER AEROGENES   Final    Anti-infectives   Start     Dose/Rate Route Frequency Ordered Stop   12/13/12 1100  piperacillin-tazobactam (ZOSYN) IVPB 3.375 g     3.375 g 12.5 mL/hr over 240 Minutes Intravenous 3 times per day 12/13/12 0918     12/13/12 1000  levofloxacin (LEVAQUIN) IVPB 750 mg     750 mg 100 mL/hr over 90 Minutes Intravenous Every 24 hours 12/13/12 0918     12/11/12 1100  metroNIDAZOLE (FLAGYL) IVPB 500 mg     500 mg 100 mL/hr over 60 Minutes Intravenous Every 8 hours 12/11/12 0950     12/11/12 1000  ceFEPIme (MAXIPIME) 1 g in dextrose 5 % 50 mL IVPB  Status:  Discontinued     1 g 100 mL/hr over 30 Minutes Intravenous 3 times per day 12/11/12 0950 12/13/12 0918   12/08/12 1600  vancomycin (VANCOCIN) 1,250 mg in sodium chloride 0.9 % 250 mL IVPB  Status:  Discontinued     1,250 mg 166.7 mL/hr over 90 Minutes Intravenous Every 8 hours 12/08/12 0831 12/08/12 0859   12/05/12 1900  vancomycin (VANCOCIN) 1,250 mg in sodium chloride 0.9 % 250 mL IVPB  Status:  Discontinued     1,250 mg 166.7 mL/hr over 90 Minutes Intravenous Every 12 hours 12/05/12 1801 12/08/12 0831   12/05/12 0400  Ampicillin-Sulbactam (UNASYN) 3 g in sodium chloride 0.9 % 100 mL IVPB  Status:  Discontinued     3 g 100 mL/hr over 60 Minutes Intravenous Every 6 hours 12/05/12 0317 12/11/12 0950     57 y.o. 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 Pharmacy consulted to dose unasyn/vanc > 11/12 cefepime/flagyl  PMH: DM, renal disorder, HLD, HTN  Events: continues to spike, changing ABX recs  from [facility] for Enterobacter, when improves consider narrowing Zosyn and keeping LQ for full course  Anticoagulation/Heme: none pta, sq heparin for px;cbc stable  Infectious Disease: Asp PNA, TM 102.6 - continues to spike, WBC initially 30.8, now 12.8, PCT 59>38.6 11/6 Unasyn>>11./12 11/6 Vanc>> 11/9 11.12 cefepime >>11/14 11.12 flagyl 11.14 Levoflox>> 11.14 Zosyn >>  CXR: 11/11 ongoing L worse than R airspace disease 11/10 Respiratory - enterobacter R ancef S roceph 11/10, blood x2,  11/10 urine>>neg final 11/6 blood>> neg final  11/6 urine>> neg final 11/6 resp>> few gram pos cocci, few candida albicans (f)  Cardiovascular: admit w/ HTNsive emergency, then shock; resolved  SBP 120-180s ; HR 100-110s labatalol 200 bid, prn hydral  Endocrinology: DM, glucose 100-200, on insulin ggt GI / Nutrition: TFs started 11/7, changed ppi to per tube  Neurology/MSK: new seizures in setting of HTNsive emergency, neuro started dilantin 100mg  q8 - 11/8 post load level = 6.7 corrected to 11.1 using albumin 2.5- 11/13 recheck level < 2.5 pt has not had more Sz activity- neuro is going to stop if EEG neg  No further seizures, needs MRI once more stable Fent @ 20, Mid@2   COPT = 0, RASS = -1  Nephrology: CrCl 80 ml/min, good UOP  Pulmonary: self-extubated 11/8>>re-intubated, PRVC 40% FIO2  PTA Medication Issues: Home Meds Not Ordered: asa, glimeperide, linagliptin/metformin, claritin, crestor, mvi, (? Losartan)  Best Practices: sq heparin, ppi, mouth care  Goal of Therapy:  Renal adjustment of antibiotics.   Plan:  Zosyn 3.375g IV q8h infuse over 4h Levofloxacin 750 mg IV q24h Follow up SCr, UOP, cultures, clinical course and adjust as clinically indicated.   Thank you for allowing pharmacy to be a part of this patients care team.  Lovenia Kim Pharm.D., BCPS Clinical Pharmacist 12/13/2012 9:51 AM Pager: [phone] Phone: [phone]

## 2012-12-13 NOTE — Progress Notes (Signed)
Spoke w/ Dr. Marin Shutter re: wean plans for pt today.  Per MD, wean pt on vent if pt tolerates.  Pt weaning on 15 ps, tol well, RR 30-34bpm, sats 95%

## 2012-12-13 NOTE — Progress Notes (Signed)
PULMONARY  / CRITICAL CARE MEDICINE  Name: Juan Glenn MRN: [id number] DOB: Jun 09, 1955    ADMISSION DATE:  12/05/2012 CONSULTATION DATE:  11/36/2014  REFERRING MD :  EDP PRIMARY SERVICE: PCCM  CHIEF COMPLAINT:  Seizure and HTN  BRIEF PATIENT DESCRIPTION: 58 yo with history of PRES syndrome 2 months ago admitted with hypertensive crisis, encephalopathy and possible aspiration.  SIGNIFICANT EVENTS / STUDIES:  11/6   Admitted with hypertensive crisis, encephalopathy and possible aspiration 11/6   TTE >>> EF 60%, no effusion 11/8   Self extubated, reintubated 11/11 New fever, septic workup sent 11/13 Antibiotics switched 11/14 Antibiotic switched again per ID Pharmacist recs  LINES / TUBES: ETT 11/6 >>>11/8 (self extubated) >>> 11/8>>> OGT 11/6 >>> R IJ TLC 11/6 >>> Foley 11/6 >>>  CULTURES: Blood 11/6 >>> neg Urine 11/6 >>> neg Sputum 11/6 >>> CANDIDA Blood 11/11 >>> Urine 11/10 >>> neg Sputum 11/10 >>> GNR >>>ENTEROBACTER AEROGENES  ANTIBIOTICS: Unasyn 11/6 >>> 11/12 Vancomycin 11/6 >>>11/9 Cefepime 11/12 >>> 11/114 Flagyl 11/12 >>> Zosyn 11/14 >>> Levaquin 11/14 >>>  SUBJECTIVE: Still febrile overnight.  Negative fluid balance but CVP still high.  VITAL SIGNS: Temp:  [98 F (36.7 C)-102.6 F (39.2 C)] 100.1 F (37.8 C) (11/14 0814) Pulse Rate:  [93-111] 108 (11/14 0700) Resp:  [23-39] 28 (11/14 0700) BP: (122-183)/(61-95) 173/95 mmHg (11/14 0814) SpO2:  [93 %-98 %] 95 % (11/14 0700) FiO2 (%):  [40 %] 40 % (11/14 0600) Weight:  [97 kg (213 lb 13.5 oz)] 97 kg (213 lb 13.5 oz) (11/14 0500)  HEMODYNAMICS: CVP:  [5 mmHg] 5 mmHg  VENTILATOR SETTINGS: Vent Mode:  [-] PRVC FiO2 (%):  [40 %] 40 % Set Rate:  [14 bmp] 14 bmp Vt Set:  [570 mL] 570 mL PEEP:  [5 cmH20] 5 cmH20 Pressure Support:  [15 cmH20] 15 cmH20 Plateau Pressure:  [22 cmH20-23 cmH20] 23 cmH20  INTAKE / OUTPUT: Intake/Output     11/13 0701 - 11/14 0700 11/14 0701 - 11/15 0700    I.V. (mL/kg) 1455.3 (15)    NG/GT 2060    IV Piggyback 500    Total Intake(mL/kg) 4015.3 (41.4)    Urine (mL/kg/hr) 3350 (1.4) 350 (2.2)   Emesis/NG output 3025 (1.3)    Total Output 6375 350   Net -2359.7 -350          PHYSICAL EXAMINATION: General:  On SBT, WOB increased, signs of diaphragmatic fatigue  Neuro:  Encephalopathic, but opens eyes spontaneously HEENT:  No JVD Cardiovascular:  Tachycardic, regular Lungs: Bilateral air entry, rhinchi Abdomen:  Soft, nontender, bowel sounds diminished Musculoskeletal:  Anasarca Skin:  Intact  CBC  Recent Labs Lab 12/11/12 0500 12/12/12 0425 12/13/12 0445  WBC 12.8* 12.9* 15.1*  HGB 11.4* 11.7* 11.8*  HCT 33.6* 34.5* 34.9*  PLT 142* 170 222   Coag's No results found for this basename: APTT, INR,  in the last 168 hours BMET  Recent Labs Lab 12/11/12 0500 12/12/12 0425 12/13/12 0445  NA 147* 145 150*  K 3.8 4.1 4.1  CL 112 112 113*  CO2 23 21 27   BUN 22 25* 29*  CREATININE 1.16 1.13 1.15  GLUCOSE 161* 152* 217*   Electrolytes  Recent Labs Lab 12/07/12 0500 12/08/12 0400  12/11/12 0500 12/12/12 0425 12/13/12 0445  CALCIUM 8.1* 7.6*  < > 8.5 8.7 8.2*  MG 2.4  --   --   --   --   --   PHOS 1.9* 2.0*  --   --   --   --   < > =  values in this interval not displayed.  Sepsis Markers  Recent Labs Lab 12/06/12 0846 12/06/12 0858 12/06/12 1446 12/06/12 2046 12/07/12 0500 12/08/12 0400  LATICACIDVEN 2.8*  --  3.0* 3.3*  --   --   PROCALCITON  --  59.36  --   --  59.70 38.63   ABG  Recent Labs Lab 12/09/12 0550 12/09/12 1205 12/11/12 1020  PHART 7.398 7.403 7.415  PCO2ART 44.4 41.9 36.7  PO2ART 70.9* 67.0* 92.0   Liver Enzymes  Recent Labs Lab 12/06/12 1022 12/09/12 0417  AST  --  46*  ALT  --  26  ALKPHOS  --  79  BILITOT  --  0.4  ALBUMIN 2.5* 2.4*   Cardiac Enzymes No results found for this basename: TROPONINI, PROBNP,  in the last 168 hours Glucose  Recent Labs Lab  12/13/12 0119 12/13/12 0211 12/13/12 0314 12/13/12 0428 12/13/12 0537 12/13/12 0640  GLUCAP 197* 210* 232* 201* 200* 195*   CXR: 11/14 >>> Bilateral airspace disease, unchanged  ASSESSMENT / PLAN:  PULMONARY A: Acute respiratory failure in setting of seizure and aspiration.  Aspiration pneumonia. Possible ARDS. P:   Goal pH>7.30, SpO2>92 Continuous mechanical support 6 cc/lg VAP bundle Daily WUA Trend ABG/CXR  CARDIOVASCULAR A:  HTN. P:  Goal MAP>85 ASA Labetalol 200 bid Hydralazine PRN  RENAL A:  AKI - improving. Metabolic acidosis- resolved, now contraction alkalosis. Hypernatremia - improving.  Positive fluid balance (stay +7L) but negative 2.5L over last 24 hours.  Hypernatremia. P:   Hold Lasix Trend BMP  GASTROINTESTINAL A:  No active issues. P:   TF per Nutrition Protonix for GI Px  HEMATOLOGIC A:  Anemia. Thrombocytopenia - stable. P: Trend CBC Heparin for DVT Px  INFECTIOUS A:  Aspiration pneumonia (Enterobacter) P:   Abx / cultures as above Discussed with ID Pharmacist - double coverage is recommended for Enterobacter because of possibility of inducible resistance, abx changed as above  ENDOCRINE A:  DM.  Hyperglycemia. P:   Insulin gtt, do NOT convert to SSI  NEUROLOGIC A:  Seizures in setting of hypertensive emergency - no seizures since admission.  Recent admission for PRESS. Acute encephalopathy  P:   Neurology following, recommendations appreciated EEG pending Dilantin 100 q8h, stop if EEG negative MRI when able Fentanyl / Versed gtt  I have personally obtained history, examined patient, evaluated and interpreted laboratory and imaging results, reviewed medical records, formulated assessment / plan and placed orders.  CRITICAL CARE:  The patient is critically ill with multiple organ systems failure and requires high complexity decision making for assessment and support, frequent evaluation and titration of therapies, application of  advanced monitoring technologies and extensive interpretation of multiple databases. Critical Care Time devoted to patient care services described in this note is 35 minutes.   Lonia Farber, MD Pulmonary and Critical Care Medicine [facility] Pager: [phone]  12/13/2012, 8:40 AM

## 2012-12-13 NOTE — Progress Notes (Signed)
NUTRITION FOLLOW UP  Intervention:   1. Enteral nutrition; continue Vital High Protein @ 20 mL/hr continuous. Advance by 10 mL q 8 hrs to 70 mL/hr goal to provide 1680 kcal, 147g protein, 1404 mL free water.  2.  Nutrition-related medication; recommend initiation of bowel regimen if appropriate.  No BM since 11/9. No bowel medications noted.   NUTRITION DIAGNOSIS:  Inadequate oral intake related to inability to eat as evidenced by NPO, vent.   Monitor:  1. Enteral nutrition; initiation with tolerance. Enteral nutrition to provide 60-70% of estimated calorie needs (22-25 kcals/kg ideal body weight) and 100% of estimated protein needs, based on ASPEN guidelines for permissive underfeeding in critically ill obese individuals. Met, ongoing.  2. Wt/wt change; monitor trends.  Ongoing.   Assessment:   Pt admitted with acute respiratory failure. Pt with h/o PRES syndrome 2 months ago.  Pt vomiting yesterday, with aspiration pneumonia.   Patient is currently intubated on ventilator support.  MV: 14.5 L/min Temp:Temp (24hrs), Avg:99.9 F (37.7 C), Min:98 F (36.7 C), Max:102.6 F (39.2 C)  Propofol: none  Pt has reached goal rate of Vital High Protein @ 70 mL/hr with tolerance.  Residuals: 0 mL   Pt awake on vent. Wife at bedside.  No questions or concerns.  Wife states pt was asking for coffee earlier today.  Currently undergoing weaning.  Height: Ht Readings from Last 1 Encounters:  12/05/12 5\' 9"  (1.753 m)    Weight Status:   Wt Readings from Last 1 Encounters:  12/13/12 213 lb 13.5 oz (97 kg)    Re-estimated needs:  Kcal: 2378  Permissive underfeeding: 1610-9604 kcal/d  Protein: 144g  Fluid: ~2.0 L/day  Skin: generalized edema, WNL  Diet Order: NPO   Intake/Output Summary (Last 24 hours) at 12/13/12 1411 Last data filed at 12/13/12 1213  Gross per 24 hour  Intake 3375.44 ml  Output   4700 ml  Net -1324.56 ml    Last BM: 11/9   Labs:   Recent Labs Lab  12/07/12 0500 12/08/12 0400  12/11/12 0500 12/12/12 0425 12/13/12 0445  NA 146* 147*  < > 147* 145 150*  K 4.4 4.0  < > 3.8 4.1 4.1  CL 117* 117*  < > 112 112 113*  CO2 20 21  < > 23 21 27   BUN 14 15  < > 22 25* 29*  CREATININE 1.31 1.24  < > 1.16 1.13 1.15  CALCIUM 8.1* 7.6*  < > 8.5 8.7 8.2*  MG 2.4  --   --   --   --   --   PHOS 1.9* 2.0*  --   --   --   --   GLUCOSE 159* 145*  < > 161* 152* 217*  < > = values in this interval not displayed.  CBG (last 3)   Recent Labs  12/13/12 0750 12/13/12 0900 12/13/12 1009  GLUCAP 177* 153* 150*    Scheduled Meds: . antiseptic oral rinse  15 mL Mouth Rinse QID  . aspirin  325 mg Oral Daily  . chlorhexidine  15 mL Mouth Rinse BID  . heparin subcutaneous  5,000 Units Subcutaneous Q8H  . labetalol  200 mg Oral BID  . levofloxacin (LEVAQUIN) IV  750 mg Intravenous Q24H  . metronidazole  500 mg Intravenous Q8H  . pantoprazole sodium  40 mg Per Tube Q24H  . phenytoin (DILANTIN) IV  100 mg Intravenous Q8H  . piperacillin-tazobactam (ZOSYN)  IV  3.375 g Intravenous Q8H  . [  START ON 12/16/2012] pneumococcal 23 valent vaccine  0.5 mL Intramuscular Tomorrow-1000    Continuous Infusions: . sodium chloride 10 mL/hr (12/13/12 1610)  . feeding supplement (VITAL HIGH PROTEIN) 1,000 mL (12/13/12 0334)  . fentaNYL infusion INTRAVENOUS 200 mcg/hr (12/13/12 0650)  . insulin (NOVOLIN-R) infusion 17.6 Units/hr (12/13/12 0650)  . midazolam (VERSED) infusion 2 mg/hr (12/13/12 0650)    Loyce Dys, MS RD LDN Clinical Inpatient Dietitian Pager: [phone] Weekend/After hours pager: [phone]

## 2012-12-13 NOTE — Plan of Care (Signed)
Problem: Consults Goal: Diabetes Guidelines if Diabetic/Glucose > 140 If diabetic or lab glucose is > 140 mg/dl - Initiate Diabetes/Hyperglycemia Guidelines & Document Interventions  Outcome: Progressing Pt remains on glucostabilizer

## 2012-12-14 LAB — CBC
HCT: 34.2 % — ABNORMAL LOW (ref 39.0–52.0)
Hemoglobin: 11.6 g/dL — ABNORMAL LOW (ref 13.0–17.0)
RBC: 3.94 MIL/uL — ABNORMAL LOW (ref 4.22–5.81)
WBC: 14.8 10*3/uL — ABNORMAL HIGH (ref 4.0–10.5)

## 2012-12-14 LAB — GLUCOSE, CAPILLARY
Glucose-Capillary: 136 mg/dL — ABNORMAL HIGH (ref 70–99)
Glucose-Capillary: 135 mg/dL — ABNORMAL HIGH (ref 70–99)
Glucose-Capillary: 120 mg/dL — ABNORMAL HIGH (ref 70–99)
Glucose-Capillary: 147 mg/dL — ABNORMAL HIGH (ref 70–99)
Glucose-Capillary: 145 mg/dL — ABNORMAL HIGH (ref 70–99)
Glucose-Capillary: 180 mg/dL — ABNORMAL HIGH (ref 70–99)
Glucose-Capillary: 190 mg/dL — ABNORMAL HIGH (ref 70–99)
Glucose-Capillary: 217 mg/dL — ABNORMAL HIGH (ref 70–99)
Glucose-Capillary: 228 mg/dL — ABNORMAL HIGH (ref 70–99)
Glucose-Capillary: 201 mg/dL — ABNORMAL HIGH (ref 70–99)
Glucose-Capillary: 193 mg/dL — ABNORMAL HIGH (ref 70–99)

## 2012-12-14 LAB — BASIC METABOLIC PANEL
BUN: 32 mg/dL — ABNORMAL HIGH (ref 6–23)
CO2: 26 mEq/L (ref 19–32)
Chloride: 115 mEq/L — ABNORMAL HIGH (ref 96–112)
GFR calc non Af Amer: 70 mL/min — ABNORMAL LOW (ref >90)
Glucose, Bld: 221 mg/dL — ABNORMAL HIGH (ref 70–99)
Potassium: 4.3 mEq/L (ref 3.5–5.1)
Sodium: 150 mEq/L — ABNORMAL HIGH (ref 135–145)

## 2012-12-14 MED ORDER — INSULIN ASPART 100 UNIT/ML ~~LOC~~ SOLN: 2.0000 [IU] | SUBCUTANEOUS | Status: DC

## 2012-12-14 MED ORDER — MINERAL OIL RE ENEM
1.0000 | ENEMA | Freq: Once | RECTAL | Status: AC | PRN
  Filled 2012-12-14: qty 1

## 2012-12-14 MED ORDER — INSULIN ASPART 100 UNIT/ML ~~LOC~~ SOLN
2.0000 [IU] | SUBCUTANEOUS | Status: DC
Start: 2012-12-14 — End: 2012-12-14

## 2012-12-14 MED ORDER — DEXTROSE 10 % IV SOLN: INTRAVENOUS | Status: DC | PRN

## 2012-12-14 MED ORDER — INSULIN ASPART 100 UNIT/ML ~~LOC~~ SOLN
0.0000 [IU] | SUBCUTANEOUS | Status: DC
  Administered 2012-12-14: 4 [IU] via SUBCUTANEOUS
  Administered 2012-12-14 (×3): 7 [IU] via SUBCUTANEOUS
  Administered 2012-12-14 – 2012-12-15 (×5): 4 [IU] via SUBCUTANEOUS
  Administered 2012-12-15: 11 [IU] via SUBCUTANEOUS
  Administered 2012-12-15 – 2012-12-16 (×2): 7 [IU] via SUBCUTANEOUS
  Administered 2012-12-16: 4 [IU] via SUBCUTANEOUS

## 2012-12-14 MED ORDER — LACTULOSE 10 GM/15ML PO SOLN
30.0000 g | Freq: Once | ORAL | Status: AC
  Administered 2012-12-14: 30 g via ORAL
  Filled 2012-12-14: qty 45

## 2012-12-14 MED ORDER — INSULIN GLARGINE 100 UNIT/ML ~~LOC~~ SOLN
15.0000 [IU] | SUBCUTANEOUS | Status: DC
  Administered 2012-12-14: 15 [IU] via SUBCUTANEOUS
  Filled 2012-12-14: qty 0.15

## 2012-12-14 NOTE — Progress Notes (Signed)
Fi02 weaned to 35%, RT to monitor.

## 2012-12-14 NOTE — Progress Notes (Signed)
PULMONARY  / CRITICAL CARE MEDICINE  Name: Juan Glenn MRN: [id number] DOB: 06/04/1955    ADMISSION DATE:  12/05/2012 CONSULTATION DATE:  11/36/2014  REFERRING MD :  EDP PRIMARY SERVICE: PCCM  CHIEF COMPLAINT:  Seizure and HTN  BRIEF PATIENT DESCRIPTION: 57 yo with history of PRES syndrome 2 months ago admitted with hypertensive crisis, encephalopathy and G neg aspiration pneumonia.  SIGNIFICANT EVENTS / STUDIES:  11/6   Admitted with hypertensive crisis, encephalopathy and possible aspiration 11/6   TTE >>> EF 60%, no effusion 11/8   Self extubated, reintubated 11/11 New fever, septic workup sent 11/13 Antibiotics switched 11/14 Antibiotic switched again per ID Pharmacist recs  LINES / TUBES: ETT 11/6 >>>11/8 (self extubated) >>> 11/8>>> OGT 11/6 >>> R IJ TLC 11/6 >>> Foley 11/6 >>>  CULTURES: Blood 11/6 >>> neg Urine 11/6 >>> neg Sputum 11/6 >>> CANDIDA Blood 11/11 >>>neg Urine 11/10 >>> neg Sputum 11/10 >>> >>ENTEROBACTER AEROGENES  ANTIBIOTICS: Unasyn 11/6 >>> 11/12 Vancomycin 11/6 >>>11/9 Cefepime 11/12 >>> 11/114 Flagyl 11/12 >>> Zosyn 11/14 >>> Levaquin 11/14 >>>  SUBJECTIVE: Afebrile overnight.   Denies CP, dyspnea Mild agitation No BM since 11/9  VITAL SIGNS: Temp:  [98.5 F (36.9 C)-100.1 F (37.8 C)] 99.1 F (37.3 C) (11/15 0748) Pulse Rate:  [98-123] 100 (11/15 0800) Resp:  [16-41] 23 (11/15 0800) BP: (111-205)/(56-109) 152/89 mmHg (11/15 0800) SpO2:  [94 %-99 %] 97 % (11/15 0800) FiO2 (%):  [40 %] 40 % (11/15 0800) Weight:  [97.1 kg (214 lb 1.1 oz)] 97.1 kg (214 lb 1.1 oz) (11/15 0500)  HEMODYNAMICS: CVP:  [6 mmHg] 6 mmHg  VENTILATOR SETTINGS: Vent Mode:  [-] PSV;CPAP FiO2 (%):  [40 %] 40 % Set Rate:  [14 bmp] 14 bmp Vt Set:  [570 mL] 570 mL PEEP:  [5 cmH20] 5 cmH20 Pressure Support:  [10 cmH20-15 cmH20] 10 cmH20 Plateau Pressure:  [20 cmH20-24 cmH20] 20 cmH20  INTAKE / OUTPUT: Intake/Output     11/14 0701 - 11/15 0700  11/15 0701 - 11/16 0700   I.V. (mL/kg) 1413 (14.6) 60.4 (0.6)   NG/GT 1800 70   IV Piggyback 412.5 12.5   Total Intake(mL/kg) 3625.5 (37.3) 142.9 (1.5)   Urine (mL/kg/hr) 3648 (1.6)    Emesis/NG output     Total Output 3648     Net -22.5 +142.9          PHYSICAL EXAMINATION: General:  Acutely ill, obese,On SBT, WOB increased, signs of diaphragmatic fatigue  Neuro:  Encephalopathic, but opens eyes spontaneously HEENT:  No JVD Cardiovascular:  Tachycardic, regular Lungs: Bilateral air entry, rhinchi Abdomen:  Soft, nontender, bowel sounds diminished Musculoskeletal:  Anasarca Skin:  Intact  CBC  Recent Labs Lab 12/11/12 0500 12/12/12 0425 12/13/12 0445  WBC 12.8* 12.9* 15.1*  HGB 11.4* 11.7* 11.8*  HCT 33.6* 34.5* 34.9*  PLT 142* 170 222   Coag's No results found for this basename: APTT, INR,  in the last 168 hours BMET  Recent Labs Lab 12/11/12 0500 12/12/12 0425 12/13/12 0445  NA 147* 145 150*  K 3.8 4.1 4.1  CL 112 112 113*  CO2 23 21 27   BUN 22 25* 29*  CREATININE 1.16 1.13 1.15  GLUCOSE 161* 152* 217*   Electrolytes  Recent Labs Lab 12/08/12 0400  12/11/12 0500 12/12/12 0425 12/13/12 0445  CALCIUM 7.6*  < > 8.5 8.7 8.2*  PHOS 2.0*  --   --   --   --   < > = values in this  interval not displayed.  Sepsis Markers  Recent Labs Lab 12/08/12 0400  PROCALCITON 38.63   ABG  Recent Labs Lab 12/09/12 0550 12/09/12 1205 12/11/12 1020  PHART 7.398 7.403 7.415  PCO2ART 44.4 41.9 36.7  PO2ART 70.9* 67.0* 92.0   Liver Enzymes  Recent Labs Lab 12/09/12 0417  AST 46*  ALT 26  ALKPHOS 79  BILITOT 0.4  ALBUMIN 2.4*   Cardiac Enzymes No results found for this basename: TROPONINI, PROBNP,  in the last 168 hours Glucose  Recent Labs Lab 12/14/12 0055 12/14/12 0156 12/14/12 0300 12/14/12 0403 12/14/12 0507 12/14/12 0606  GLUCAP 119* 141* 135* 139* 120* 147*   CXR: 11/14 >>> Bilateral airspace disease, unchanged  ASSESSMENT /  PLAN:  PULMONARY A: Acute respiratory failure in setting of seizure and aspiration.  Aspiration pneumonia. Possible ARDS. P:   Daily WUA/ SBTs - gettign closer to extubation but WOB still high  CARDIOVASCULAR A:  HTN. P:  Goal MAP>85 ASA Labetalol 200 bid Hydralazine PRN  RENAL A:  AKI - improving. Metabolic acidosis- resolved, contraction alkalosis. Hypernatremia - improving.  Positive fluid balance  P:   Hold Lasix Trend BMP  GASTROINTESTINAL A:  Constipation P:   TF per Nutrition Protonix for GI Px Lactulose, if no BM in 6h, enema  HEMATOLOGIC A:  Anemia. Thrombocytopenia - stable. P: Trend CBC Heparin for DVT Px  INFECTIOUS A:  Aspiration pneumonia (Enterobacter) P:   11/14 Discussed with ID Pharmacist - double coverage is recommended for Enterobacter because of possibility of inducible resistance, abx changed to levaquin + zosyn  ENDOCRINE A:  DM.  Hyperglycemia. 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  Recent admission for PRESS. Acute encephalopathy  P:   Neurology following, recommendations appreciated EEG pending  Dilantin 100 q8h, stop if EEG negative Fentanyl / Versed gtt   GLOBAL - updated wife  I have personally obtained history, examined patient, evaluated and interpreted laboratory and imaging results, reviewed medical records, formulated assessment / plan and placed orders.  CRITICAL CARE:  The patient is critically ill with multiple organ systems failure and requires high complexity decision making for assessment and support, frequent evaluation and titration of therapies, application of advanced monitoring technologies and extensive interpretation of multiple databases. Critical Care Time devoted to patient care services described in this note is 35 minutes.   Oretha Milch., MD Pulmonary and Critical Care  Medicine [facility] Pager: [phone]  12/14/2012, 8:35 AM

## 2012-12-15 ENCOUNTER — Inpatient Hospital Stay (HOSPITAL_COMMUNITY): Payer: BC Managed Care – PPO

## 2012-12-15 LAB — BASIC METABOLIC PANEL
BUN: 33 mg/dL — ABNORMAL HIGH (ref 6–23)
Calcium: 8.4 mg/dL (ref 8.4–10.5)
Chloride: 116 mEq/L — ABNORMAL HIGH (ref 96–112)
Creatinine, Ser: 1.28 mg/dL (ref 0.50–1.35)
GFR calc Af Amer: 70 mL/min — ABNORMAL LOW (ref >90)
GFR calc non Af Amer: 60 mL/min — ABNORMAL LOW (ref >90)

## 2012-12-15 LAB — CBC
MCHC: 33 g/dL (ref 30.0–36.0)
Platelets: 268 10*3/uL (ref 150–400)
RDW: 14.1 % (ref 11.5–15.5)
WBC: 11.8 10*3/uL — ABNORMAL HIGH (ref 4.0–10.5)

## 2012-12-15 LAB — GLUCOSE, CAPILLARY
Glucose-Capillary: 173 mg/dL — ABNORMAL HIGH (ref 70–99)
Glucose-Capillary: 188 mg/dL — ABNORMAL HIGH (ref 70–99)

## 2012-12-15 LAB — POCT I-STAT 3, ART BLOOD GAS (G3+)
Acid-Base Excess: 3 mmol/L — ABNORMAL HIGH (ref 0.0–2.0)
Bicarbonate: 28.3 mEq/L — ABNORMAL HIGH (ref 20.0–24.0)
Patient temperature: 98.6
TCO2: 30 mmol/L (ref 0–100)
pH, Arterial: 7.423 (ref 7.350–7.450)
pO2, Arterial: 66 mmHg — ABNORMAL LOW (ref 80.0–100.0)

## 2012-12-15 MED ORDER — ACETAMINOPHEN 325 MG PO TABS
650.0000 mg | ORAL_TABLET | ORAL | Status: DC | PRN
  Administered 2012-12-16: 650 mg via ORAL
  Filled 2012-12-15: qty 2

## 2012-12-15 MED ORDER — FLEET ENEMA 7-19 GM/118ML RE ENEM
1.0000 | ENEMA | Freq: Once | RECTAL | Status: AC
  Administered 2012-12-15: 1 via RECTAL
  Filled 2012-12-15 (×3): qty 1

## 2012-12-15 MED ORDER — INSULIN GLARGINE 100 UNIT/ML ~~LOC~~ SOLN
10.0000 [IU] | Freq: Every day | SUBCUTANEOUS | Status: DC
  Administered 2012-12-15 – 2012-12-17 (×3): 10 [IU] via SUBCUTANEOUS
  Filled 2012-12-15 (×4): qty 0.1

## 2012-12-15 NOTE — Progress Notes (Signed)
PULMONARY  / CRITICAL CARE MEDICINE  Name: Juan Glenn MRN: [id number] DOB: 1955/06/08    ADMISSION DATE:  12/05/2012 CONSULTATION DATE:  11/36/2014  REFERRING MD :  EDP PRIMARY SERVICE: PCCM  CHIEF COMPLAINT:  Seizure and HTN  BRIEF PATIENT DESCRIPTION: 57 yo with history of PRES syndrome 2 months ago admitted with hypertensive crisis, encephalopathy and G neg aspiration pneumonia.  SIGNIFICANT EVENTS / STUDIES:  11/6   Admitted with hypertensive crisis, encephalopathy and possible aspiration 11/6   TTE >>> EF 60%, no effusion 11/8   Self extubated, reintubated 11/11 New fever, septic workup sent 11/13 Antibiotics switched 11/14 Antibiotic switched again per ID Pharmacist recs  LINES / TUBES: ETT 11/6 >>>11/8 (self extubated) >>> 11/8>>> OGT 11/6 >>> R IJ TLC 11/6 >>> Foley 11/6 >>>  CULTURES: Blood 11/6 >>> neg Urine 11/6 >>> neg Sputum 11/6 >>> CANDIDA Blood 11/11 >>>neg Urine 11/10 >>> neg Sputum 11/10 >>> >>ENTEROBACTER AEROGENES  ANTIBIOTICS: Unasyn 11/6 >>> 11/12 Vancomycin 11/6 >>>11/9 Cefepime 11/12 >>> 11/114 Flagyl 11/12 >>>11/16 Zosyn 11/14 >>> Levaquin 11/14 >>>  SUBJECTIVE:Low gr temp overnight.   Denies CP, dyspnea BM -smear with lactulose 11/15  VITAL SIGNS: Temp:  [98.1 F (36.7 C)-100 F (37.8 C)] 99.1 F (37.3 C) (11/16 0400) Pulse Rate:  [92-110] 92 (11/16 0700) Resp:  [22-29] 22 (11/16 0700) BP: (102-153)/(62-91) 115/63 mmHg (11/16 0700) SpO2:  [96 %-100 %] 98 % (11/16 0700) FiO2 (%):  [30 %-40 %] 30 % (11/16 0721) Weight:  [95.8 kg (211 lb 3.2 oz)] 95.8 kg (211 lb 3.2 oz) (11/16 0500)  HEMODYNAMICS: CVP:  [11 mmHg] 11 mmHg  VENTILATOR SETTINGS: Vent Mode:  [-] PSV;CPAP FiO2 (%):  [30 %-40 %] 30 % Set Rate:  [14 bmp] 14 bmp Vt Set:  [570 mL] 570 mL PEEP:  [5 cmH20] 5 cmH20 Pressure Support:  [5 cmH20-10 cmH20] 5 cmH20 Plateau Pressure:  [20 cmH20-24 cmH20] 22 cmH20  INTAKE / OUTPUT: Intake/Output     11/15 0701 -  11/16 0700 11/16 0701 - 11/17 0700   I.V. (mL/kg) 1191.4 (12.4)    Other 90    NG/GT 1820    IV Piggyback 737.5    Total Intake(mL/kg) 3838.9 (40.1)    Urine (mL/kg/hr) 2945 (1.3)    Total Output 2945     Net +893.9            PHYSICAL EXAMINATION: General:  Acutely ill, obese,On SBT, WOB increased, signs of diaphragmatic fatigue  Neuro:  Encephalopathic, but opens eyes spontaneously HEENT:  No JVD Cardiovascular:  Tachycardic, regular Lungs: Bilateral air entry, rhinchi Abdomen:  Soft, nontender, bowel sounds diminished Musculoskeletal:  Anasarca Skin:  Intact  CBC  Recent Labs Lab 12/13/12 0445 12/14/12 0900 12/15/12 0400  WBC 15.1* 14.8* 11.8*  HGB 11.8* 11.6* 10.8*  HCT 34.9* 34.2* 32.7*  PLT 222 261 268   Coag's No results found for this basename: APTT, INR,  in the last 168 hours BMET  Recent Labs Lab 12/13/12 0445 12/14/12 0900 12/15/12 0400  NA 150* 150* 152*  K 4.1 4.3 4.2  CL 113* 115* 116*  CO2 27 26 26   BUN 29* 32* 33*  CREATININE 1.15 1.13 1.28  GLUCOSE 217* 221* 283*   Electrolytes  Recent Labs Lab 12/13/12 0445 12/14/12 0900 12/15/12 0400  CALCIUM 8.2* 8.5 8.4    Sepsis Markers No results found for this basename: LATICACIDVEN, PROCALCITON, O2SATVEN,  in the last 168 hours ABG  Recent Labs Lab 12/09/12 0550 12/09/12 1205  12/11/12 1020  PHART 7.398 7.403 7.415  PCO2ART 44.4 41.9 36.7  PO2ART 70.9* 67.0* 92.0   Liver Enzymes  Recent Labs Lab 12/09/12 0417  AST 46*  ALT 26  ALKPHOS 79  BILITOT 0.4  ALBUMIN 2.4*   Cardiac Enzymes No results found for this basename: TROPONINI, PROBNP,  in the last 168 hours Glucose  Recent Labs Lab 12/14/12 0944 12/14/12 1151 12/14/12 1539 12/14/12 1941 12/14/12 2310 12/15/12 0415  GLUCAP 190* 217* 228* 201* 193* 260*   CXR: 11/16 >>> Bilateral airspace disease,improved  ASSESSMENT / PLAN:  PULMONARY A: Acute respiratory failure in setting of seizure and aspiration.   Aspiration pneumonia. Possible ARDS. P:   Daily WUA/ SBTs - gettign closer to extubation -obtain ABg on PS 5/5   CARDIOVASCULAR A:  HTN. P:  Goal MAP>85 ASA Labetalol 200 bid Hydralazine PRN  RENAL A:  AKI - improving. Metabolic acidosis- resolved, contraction alkalosis.  Hypernatremia -  Positive fluid balance  P:   Hold Lasix Trend BMP  GASTROINTESTINAL A:  Constipation P:   Hold TF in anticipatio of extubation Protonix for GI Px Rpt enema  HEMATOLOGIC A:  Anemia. Thrombocytopenia - stable. P: Trend CBC Heparin for DVT Px  INFECTIOUS A:  Aspiration pneumonia (Enterobacter) P:   11/14 Discussed with ID Pharmacist - double coverage is recommended for Enterobacter because of possibility of inducible resistance, abx changed to levaquin + zosyn Dc flagyl  ENDOCRINE A:  DM.  Hyperglycemia. 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  Recent admission for PRESS. Acute encephalopathy  P:   Neurology saw, recommendations appreciated EEG pending  Dilantin 100 q8h, stop if EEG negative Dc Fentanyl / Versed gtt   GLOBAL - updated wife at bedside  I have personally obtained history, examined patient, evaluated and interpreted laboratory and imaging results, reviewed medical records, formulated assessment / plan and placed orders.  CRITICAL CARE:  The patient is critically ill with multiple organ systems failure and requires high complexity decision making for assessment and support, frequent evaluation and titration of therapies, application of advanced monitoring technologies and extensive interpretation of multiple databases. Critical Care Time devoted to patient care services described in this note is 35 minutes.   Oretha Milch., MD Pulmonary and Critical Care Medicine [facility] Pager: [phone]  12/15/2012, 7:57  AM

## 2012-12-15 NOTE — Progress Notes (Signed)
50 mcg Fentanyl, 75 mg Versed wasted in sink with Aggie Cosier, [employer] fundraiser.

## 2012-12-15 NOTE — Procedures (Signed)
Extubation Procedure Note  Patient Details:   Name: Juan Glenn DOB: 1955-06-25 MRN: [id number]   Airway Documentation:  Airway 8 mm (Active)  Secured at (cm) 26 cm 12/15/2012  7:21 AM  Measured From Lips 12/15/2012  7:21 AM  Secured Location Right 12/15/2012  7:21 AM  Secured By [employer] 12/15/2012  7:21 AM  Tube Holder Repositioned Yes 12/15/2012  4:24 AM  Cuff Pressure (cm H2O) 24 cm H2O 12/15/2012  7:21 AM  Site Condition Dry 12/15/2012  7:21 AM    Evaluation  O2 sats: stable throughout Complications: No apparent complications Patient did tolerate procedure well. Bilateral Breath Sounds: Diminished Suctioning: Oral;Airway Yes, NIF-(-20), FVC-1.4L, (+) cuff leak, able to lift head off bed on command, time out done, order placed, put on 4 lpm n/c prior to extubation <'d to 2 lpm n/c-99%, no stridor noted s/p , wife remained @ bedside, RN made aware, RT to monitor.  Joylene John 12/15/2012, 11:50 AM

## 2012-12-16 ENCOUNTER — Inpatient Hospital Stay (HOSPITAL_COMMUNITY): Payer: BC Managed Care – PPO

## 2012-12-16 DIAGNOSIS — N058 Unspecified nephritic syndrome with other morphologic changes: Secondary | ICD-10-CM

## 2012-12-16 DIAGNOSIS — R569 Unspecified convulsions: Secondary | ICD-10-CM

## 2012-12-16 DIAGNOSIS — E1129 Type 2 diabetes mellitus with other diabetic kidney complication: Secondary | ICD-10-CM

## 2012-12-16 LAB — BASIC METABOLIC PANEL
BUN: 28 mg/dL — ABNORMAL HIGH (ref 6–23)
CO2: 25 mEq/L (ref 19–32)
Chloride: 118 mEq/L — ABNORMAL HIGH (ref 96–112)
Creatinine, Ser: 1.27 mg/dL (ref 0.50–1.35)
GFR calc Af Amer: 70 mL/min — ABNORMAL LOW (ref >90)
Glucose, Bld: 189 mg/dL — ABNORMAL HIGH (ref 70–99)
Potassium: 3.9 mEq/L (ref 3.5–5.1)

## 2012-12-16 LAB — CULTURE, BLOOD (ROUTINE X 2)
Culture: NO GROWTH
Culture: NO GROWTH

## 2012-12-16 LAB — CBC
HCT: 32.3 % — ABNORMAL LOW (ref 39.0–52.0)
Hemoglobin: 10.7 g/dL — ABNORMAL LOW (ref 13.0–17.0)
MCV: 89 fL (ref 78.0–100.0)
Platelets: 304 10*3/uL (ref 150–400)
RDW: 13.9 % (ref 11.5–15.5)
WBC: 12 10*3/uL — ABNORMAL HIGH (ref 4.0–10.5)

## 2012-12-16 LAB — GLUCOSE, CAPILLARY
Glucose-Capillary: 186 mg/dL — ABNORMAL HIGH (ref 70–99)
Glucose-Capillary: 208 mg/dL — ABNORMAL HIGH (ref 70–99)
Glucose-Capillary: 266 mg/dL — ABNORMAL HIGH (ref 70–99)
Glucose-Capillary: 204 mg/dL — ABNORMAL HIGH (ref 70–99)

## 2012-12-16 MED ORDER — PANTOPRAZOLE SODIUM 40 MG PO TBEC
40.0000 mg | DELAYED_RELEASE_TABLET | Freq: Every day | ORAL | Status: DC
  Administered 2012-12-16 – 2012-12-18 (×3): 40 mg via ORAL
  Filled 2012-12-16 (×3): qty 1

## 2012-12-16 MED ORDER — INSULIN ASPART 100 UNIT/ML ~~LOC~~ SOLN
0.0000 [IU] | Freq: Three times a day (TID) | SUBCUTANEOUS | Status: DC
  Administered 2012-12-16: 7 [IU] via SUBCUTANEOUS
  Administered 2012-12-16: 11 [IU] via SUBCUTANEOUS
  Administered 2012-12-17 (×2): 4 [IU] via SUBCUTANEOUS
  Administered 2012-12-17: 11 [IU] via SUBCUTANEOUS
  Administered 2012-12-18: 4 [IU] via SUBCUTANEOUS

## 2012-12-16 MED ORDER — LEVOFLOXACIN 750 MG PO TABS
750.0000 mg | ORAL_TABLET | Freq: Every day | ORAL | Status: DC
  Administered 2012-12-16 – 2012-12-18 (×3): 750 mg via ORAL
  Filled 2012-12-16 (×3): qty 1

## 2012-12-16 MED ORDER — ATORVASTATIN CALCIUM 20 MG PO TABS
20.0000 mg | ORAL_TABLET | Freq: Every day | ORAL | Status: DC
  Administered 2012-12-16 – 2012-12-17 (×2): 20 mg via ORAL
  Filled 2012-12-16 (×4): qty 1

## 2012-12-16 NOTE — Progress Notes (Signed)
PULMONARY  / CRITICAL CARE MEDICINE  Name: Juan Glenn MRN: [id number] DOB: July 04, 1955    ADMISSION DATE:  12/05/2012 CONSULTATION DATE:  11/36/2014  REFERRING MD :  EDP PRIMARY SERVICE: PCCM  CHIEF COMPLAINT:  Seizure and HTN  BRIEF PATIENT DESCRIPTION: 57 yo with history of PRES syndrome 2 months ago admitted with hypertensive crisis, encephalopathy and G neg aspiration pneumonia.  SIGNIFICANT EVENTS / STUDIES:  11/6   Admitted with hypertensive crisis, encephalopathy and possible aspiration 11/6   TTE >>> EF 60%, no effusion 11/8   Self extubated, reintubated 11/11 New fever, septic workup sent 11/13 Antibiotics switched 11/14 Antibiotic switched again per ID Pharmacist recs 11/17 EEG >>  LINES / TUBES: ETT 11/6 >>>11/8 (self extubated) >>> 11/8>>>11/16 R IJ TLC 11/6 >>>  CULTURES: Blood 11/6 >>> neg Urine 11/6 >>> neg Sputum 11/6 >>> CANDIDA Blood 11/11 >>>neg Urine 11/10 >>> neg Sputum 11/10 >>> >>ENTEROBACTER AEROGENES  ANTIBIOTICS: Unasyn 11/6 >>> 11/12 Vancomycin 11/6 >>>11/9 Cefepime 11/12 >>> 11/114 Flagyl 11/12 >>>11/16 Zosyn 11/14 >>> Levaquin 11/14 >>>  SUBJECTIVE: Denies cough, chest pain, abd pain.  VITAL SIGNS: Temp:  [98.7 F (37.1 C)-100.5 F (38.1 C)] 99.2 F (37.3 C) (11/17 0800) Pulse Rate:  [95-112] 104 (11/17 0800) Resp:  [20-42] 28 (11/17 0800) BP: (138-171)/(77-100) 171/90 mmHg (11/17 0800) SpO2:  [94 %-99 %] 96 % (11/17 0800) Weight:  [200 lb 9.9 oz (91 kg)] 200 lb 9.9 oz (91 kg) (11/17 0400)  3 liters  INTAKE / OUTPUT: Intake/Output     11/16 0701 - 11/17 0700 11/17 0701 - 11/18 0700   P.O. 480    I.V. (mL/kg) 300 (3.3) 30 (0.3)   Other     NG/GT 0    IV Piggyback 237.5    Total Intake(mL/kg) 1017.5 (11.2) 30 (0.3)   Urine (mL/kg/hr) 2100 (1)    Stool 2 (0) 1 (0)   Total Output 2102 1   Net -1084.5 +29        Urine Occurrence 5 x    Stool Occurrence 7 x      PHYSICAL EXAMINATION: General: sitting in  chair Neuro: follows commands HEENT: no sinus tenderness Cardiovascular:  Tachycardic, regular Lungs: scattered rhonchi Abdomen:  Soft, nontender Musculoskeletal: no edema Skin: no rashes  CBC  Recent Labs Lab 12/14/12 0900 12/15/12 0400 12/16/12 0500  WBC 14.8* 11.8* 12.0*  HGB 11.6* 10.8* 10.7*  HCT 34.2* 32.7* 32.3*  PLT 261 268 304   BMET  Recent Labs Lab 12/14/12 0900 12/15/12 0400 12/16/12 0500  NA 150* 152* 154*  K 4.3 4.2 3.9  CL 115* 116* 118*  CO2 26 26 25   BUN 32* 33* 28*  CREATININE 1.13 1.28 1.27  GLUCOSE 221* 283* 189*   Electrolytes  Recent Labs Lab 12/14/12 0900 12/15/12 0400 12/16/12 0500  CALCIUM 8.5 8.4 8.4   ABG  Recent Labs Lab 12/09/12 1205 12/11/12 1020 12/15/12 0830  PHART 7.403 7.415 7.423  PCO2ART 41.9 36.7 43.3  PO2ART 67.0* 92.0 66.0*   Glucose  Recent Labs Lab 12/15/12 1221 12/15/12 1719 12/15/12 2013 12/15/12 2343 12/16/12 0445 12/16/12 0804  GLUCAP 173* 193* 188* 186* 165* 208*   CXR:  Dg Chest Port 1 View  12/16/2012   CLINICAL DATA:  Followup pneumonia  EXAM: PORTABLE CHEST - 1 VIEW  COMPARISON:  12/15/2012  FINDINGS: Patchy airspace consolidation in the right upper lobe is stable. Left lower lung zone opacity appears mildly improved despite low lung volumes accentuating lung base bronchovascular markings.  No new lung opacities. No obvious pleural effusion and no pneumothorax.  Right internal jugular central venous catheter is stable. The endotracheal tube and nasogastric tube have been removed.  IMPRESSION: 1. Right upper lobe airspace lung opacity is stable. Mild improvement in the opacity at the left lung base. No new lung opacities. Findings consistent with improving multifocal pneumonia. 2. Status post removal of the endotracheal and nasogastric tubes.   Electronically Signed   By: Amie Portland M.D.   On: 12/16/2012 07:21   Dg Chest Port 1 View  12/15/2012   CLINICAL DATA:  Followup aspiration pneumonia.   EXAM: PORTABLE CHEST - 1 VIEW  COMPARISON:  12/13/2012  FINDINGS: Lung volumes are low. There are airspace opacity in the left lung base, as well as in the central right upper and lower lung zones, without significant change.  No pleural effusion or pneumothorax.  Support apparatus is stable in well positioned.  IMPRESSION: 1. No significant change from the prior study. 2. Support apparatus is stable in well positioned. 3. Patchy areas of airspace opacity are also unchanged suggesting multifocal pneumonia.   Electronically Signed   By: Amie Portland M.D.   On: 12/15/2012 08:18     ASSESSMENT / PLAN:  PULMONARY A: Acute respiratory failure in setting of seizure and aspiration.  Aspiration pneumonia.  P:   -oxygen to keep SpO2 > 92% -f/u CXR intermittently -bronchial hygiene  CARDIOVASCULAR A:   Hx of HTN, hyperlipidema. P:  -continue ASA, labetalol -prn hydralazine -resume crestor >> use lipitor while in hospital  RENAL A:  AKI - resolve.  Metabolic acidosis- resolved. Contraction alkalosis, hypernatremia -  2nd to diuresis. P:   -hold lasix -monitor renal fx, urine outpt, electrolytes -even fluid balance  GASTROINTESTINAL A:   Nutrition. Constipation - improved. P:   -advance diet as tolerated -protonix for SUP -bowel regimen  HEMATOLOGIC A:   Anemia.  Thrombocytopenia - resolve. P: -f/u CBC -SQ heparin for DVT prevention  INFECTIOUS A:  Aspiration pneumonia (Enterobacter) >> d/w ID pharmacist 11/14 >> recommend continue double Abx coverage due to inducible resistance with Enterobacter. P:   -Day 12 of Abx, Day 3 of levaquin/zosyn  -d/c CVL  ENDOCRINE A:   DM type II with hyperglycemia. 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  Recent admission for PRESS. Acute encephalopathy >> resolved. Deconditioning. P:   -F/u EEG 11/17 -PT/OT  evaluation  Transfer to SDU.  Coralyn Helling, MD [facility] Pulmonary/Critical Care 12/16/2012, 10:19 AM Pager:  [phone] After 3pm call: [phone]

## 2012-12-16 NOTE — Procedures (Signed)
ELECTROENCEPHALOGRAM REPORT   Patient: Juan Glenn       Room #: 5H84 EEG No. ID: [id number] Age: 56 y.o.        Sex: male Referring Physician: Molli Knock Report Date:  12/16/2012        Interpreting Physician: Aline Brochure  History: Juan Glenn is an 57 y.o. male who was admitted with acute hypertensive encephalopathy with generalized seizure activity. Patient is currently not having seizure activity and is not on anticonvulsant medication.  Indications for study:  Rule out new onset seizure disorder.  Technique: This is an 18 channel routine scalp EEG performed at the bedside with bipolar and monopolar montages arranged in accordance to the international 10/20 system of electrode placement.   Description: This EEG recording was performed during wakefulness. Prominent background activity consisted of low amplitude diffuse irregular mixed delta and theta activity which was symmetrical. 9 Hz L4 rhythm was recorded from the posterior head regions, which attenuated with eye opening. Photic stimulation produced a minimal bilateral occipital driving response. Hyperventilation was not performed. No epileptiform discharges were recorded.  Interpretation: This EEG is abnormal with mild generalized continuous nonspecific slowing of cerebral activity. This pattern of swelling is nonspecific and can be seen with a wide variety of encephalopathic processes, including degenerative diseases as well as metabolic abnormalities. No evidence of an epileptic disorder was demonstrated.   Venetia Maxon M.D. [facility] [phone]

## 2012-12-16 NOTE — Progress Notes (Signed)
Patient has had no recurrence of seizure activity and is no longer on anti-epileptic medication. Seizure activity was most likely secondary to acute hypertensive encephalopathy. EEG showed no indications increased risk of recurrent seizures.  Recommend no changes in current management. No further neurodiagnostic studies are indicated. No further neurological intervention is indicated. I will see this patient followup on an as-needed basis. Please feel free to contact neurology service in followup as needed.  Venetia Maxon M.D. [facility] [phone]

## 2012-12-16 NOTE — Progress Notes (Signed)
ANTIBIOTIC CONSULT NOTE - FOLLOW UP  Pharmacy Consult for Zosyn and Levofloxacin  Indication: pneumonia  No Known Allergies  Patient Measurements: Height: 5\' 9"  (175.3 cm) Weight: 200 lb 9.9 oz (91 kg) IBW/kg (Calculated) : 70.7  Vital Signs: Temp: 98.7 F (37.1 C) (11/17 0400) Temp src: Oral (11/17 0400) BP: 144/85 mmHg (11/17 0600) Pulse Rate: 95 (11/17 0600) Intake/Output from previous day: 11/16 0701 - 11/17 0700 In: 1007.5 [P.O.:480; I.V.:290; IV Piggyback:237.5] Out: 2102 [Urine:2100; Stool:2] Intake/Output from this shift:    Labs:  Recent Labs  12/14/12 0900 12/15/12 0400 12/16/12 0500  WBC 14.8* 11.8* 12.0*  HGB 11.6* 10.8* 10.7*  PLT 261 268 304  CREATININE 1.13 1.28 1.27   Estimated Creatinine Clearance: 70.7 ml/min (by C-G formula based on Cr of 1.27). No results found for this basename: Rolm Gala, VANCORANDOM, GENTTROUGH, GENTPEAK, GENTRANDOM, TOBRATROUGH, TOBRAPEAK, TOBRARND, AMIKACINPEAK, AMIKACINTROU, AMIKACIN,  in the last 72 hours   Microbiology: Recent Results (from the past 720 hour(s))  URINE CULTURE     Status: None   Collection Time    12/05/12 12:41 AM      Result Value Range Status   Specimen Description URINE, RANDOM   Final   Special Requests Normal   Final   Culture  Setup Time     Final   Value: 12/05/2012 02:45     Performed at [facility] Count     Final   Value: NO GROWTH     Performed at [facility]   Culture     Final   Value: NO GROWTH     Performed at [facility]   Report Status 12/06/2012 FINAL   Final  MRSA PCR SCREENING     Status: None   Collection Time    12/05/12  4:47 AM      Result Value Range Status   MRSA by PCR NEGATIVE  NEGATIVE Final   Comment:            The GeneXpert MRSA Assay (FDA     approved for NASAL specimens     only), is one component of a     comprehensive MRSA colonization     surveillance program. It is not     intended to diagnose MRSA     infection nor to guide or     monitor treatment for     MRSA infections.  CULTURE, BLOOD (ROUTINE X 2)     Status: None   Collection Time    12/05/12  5:30 AM      Result Value Range Status   Specimen Description BLOOD RIGHT HAND   Final   Special Requests BOTTLES DRAWN AEROBIC ONLY 2CC   Final   Culture  Setup Time     Final   Value: 12/05/2012 09:24     Performed at [facility]   Culture     Final   Value: NO GROWTH 5 DAYS     Performed at [facility]   Report Status 12/11/2012 FINAL   Final  CULTURE, BLOOD (ROUTINE X 2)     Status: None   Collection Time    12/05/12  5:45 AM      Result Value Range Status   Specimen Description BLOOD RIGHT HAND   Final   Special Requests BOTTLES DRAWN AEROBIC ONLY 3CC   Final   Culture  Setup Time     Final   Value: 12/05/2012 09:24  Performed at [facility]     Final   Value: NO GROWTH 5 DAYS     Performed at [facility]   Report Status 12/11/2012 FINAL   Final  URINE CULTURE     Status: None   Collection Time    12/05/12  8:44 AM      Result Value Range Status   Specimen Description URINE, CATHETERIZED   Final   Special Requests NONE   Final   Culture  Setup Time     Final   Value: 12/05/2012 16:14     Performed at [facility] Count     Final   Value: NO GROWTH     Performed at [facility]   Culture     Final   Value: NO GROWTH     Performed at [facility]   Report Status 12/06/2012 FINAL   Final  CULTURE, RESPIRATORY (NON-EXPECTORATED)     Status: None   Collection Time    12/05/12  7:49 PM      Result Value Range Status   Specimen Description TRACHEAL ASPIRATE   Final   Special Requests NONE   Final   Gram Stain     Final   Value: ABUNDANT WBC PRESENT, PREDOMINANTLY PMN     RARE SQUAMOUS EPITHELIAL CELLS PRESENT     FEW GRAM POSITIVE COCCI     IN PAIRS RARE YEAST     Performed at [facility]   Culture     Final   Value: FEW  CANDIDA ALBICANS     Performed at [facility]   Report Status 12/08/2012 FINAL   Final  URINE CULTURE     Status: None   Collection Time    12/09/12  4:57 PM      Result Value Range Status   Specimen Description URINE, CATHETERIZED   Final   Special Requests NONE   Final   Culture  Setup Time     Final   Value: 12/09/2012 17:24     Performed at [facility]   Culture     Final   Value: NO GROWTH     Performed at [facility]   Report Status 12/10/2012 FINAL   Final  CULTURE, BLOOD (ROUTINE X 2)     Status: None   Collection Time    12/09/12  7:37 PM      Result Value Range Status   Specimen Description BLOOD ARM LEFT   Final   Special Requests BOTTLES DRAWN AEROBIC ONLY 2CC   Final   Culture  Setup Time     Final   Value: 12/10/2012 01:10     Performed at [facility]   Culture     Final   Value:        BLOOD CULTURE RECEIVED NO GROWTH TO DATE CULTURE WILL BE HELD FOR 5 DAYS BEFORE ISSUING A FINAL NEGATIVE REPORT     Performed at [facility]   Report Status PENDING   Incomplete  CULTURE, BLOOD (ROUTINE X 2)     Status: None   Collection Time    12/09/12  7:42 PM      Result Value Range Status   Specimen Description BLOOD HAND LEFT   Final   Special Requests BOTTLES DRAWN AEROBIC ONLY 3CC   Final   Culture  Setup Time     Final   Value: 12/10/2012 01:10  Performed at [facility]     Final   Value:        BLOOD CULTURE RECEIVED NO GROWTH TO DATE CULTURE WILL BE HELD FOR 5 DAYS BEFORE ISSUING A FINAL NEGATIVE REPORT     Performed at [facility]   Report Status PENDING   Incomplete  CULTURE, RESPIRATORY (NON-EXPECTORATED)     Status: None   Collection Time    12/09/12  8:14 PM      Result Value Range Status   Specimen Description TRACHEAL ASPIRATE   Final   Special Requests NONE   Final   Gram Stain     Final   Value: ABUNDANT WBC PRESENT,BOTH PMN AND MONONUCLEAR     RARE SQUAMOUS EPITHELIAL CELLS  PRESENT     NO ORGANISMS SEEN     Performed at [facility]   Culture     Final   Value: FEW ENTEROBACTER AEROGENES     Performed at [facility]   Report Status 12/12/2012 FINAL   Final   Organism ID, Bacteria ENTEROBACTER AEROGENES   Final    Anti-infectives   Start     Dose/Rate Route Frequency Ordered Stop   12/13/12 1100  piperacillin-tazobactam (ZOSYN) IVPB 3.375 g     3.375 g 12.5 mL/hr over 240 Minutes Intravenous 3 times per day 12/13/12 0918     12/13/12 1000  levofloxacin (LEVAQUIN) IVPB 750 mg     750 mg 100 mL/hr over 90 Minutes Intravenous Every 24 hours 12/13/12 0918     12/11/12 1100  metroNIDAZOLE (FLAGYL) IVPB 500 mg  Status:  Discontinued     500 mg 100 mL/hr over 60 Minutes Intravenous Every 8 hours 12/11/12 0950 12/15/12 0756   12/11/12 1000  ceFEPIme (MAXIPIME) 1 g in dextrose 5 % 50 mL IVPB  Status:  Discontinued     1 g 100 mL/hr over 30 Minutes Intravenous 3 times per day 12/11/12 0950 12/13/12 0918   12/08/12 1600  vancomycin (VANCOCIN) 1,250 mg in sodium chloride 0.9 % 250 mL IVPB  Status:  Discontinued     1,250 mg 166.7 mL/hr over 90 Minutes Intravenous Every 8 hours 12/08/12 0831 12/08/12 0859   12/05/12 1900  vancomycin (VANCOCIN) 1,250 mg in sodium chloride 0.9 % 250 mL IVPB  Status:  Discontinued     1,250 mg 166.7 mL/hr over 90 Minutes Intravenous Every 12 hours 12/05/12 1801 12/08/12 0831   12/05/12 0400  Ampicillin-Sulbactam (UNASYN) 3 g in sodium chloride 0.9 % 100 mL IVPB  Status:  Discontinued     3 g 100 mL/hr over 60 Minutes Intravenous Every 6 hours 12/05/12 0317 12/11/12 0950     57 y.o. 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 Pharmacy consulted to dose Levaquin and Zosyn for Enterbacter PNA.  Continuing double coverage for now due to possibility of inducible resistance, but could consider narrowing as he improves clinically.  11/6  Unasyn>>11./12 11/6 Vanc>> 11/9 11.12 cefepime >>11/14 11.12 flagyl > 11/16 11.14 Levoflox>> 11.14 Zosyn >>  Goal of Therapy:  Renal adjustment of antibiotics.  Plan:  Zosyn 3.375g IV q8h infuse over 4h Levofloxacin 750 mg IV q24h Follow up SCr, UOP, cultures, clinical course and adjust as clinically indicated.  Thank you for allowing pharmacy to be a part of this patients care team. Tad Moore, BCPS  Clinical Pharmacist Pager [phone]  12/16/2012 9:01 AM

## 2012-12-16 NOTE — Progress Notes (Signed)
EEG completed; results pending.    

## 2012-12-16 NOTE — Evaluation (Signed)
Physical Therapy Evaluation Patient Details Name: Juan Glenn MRN: [id number] DOB: Jun 09, 1955 Today's Date: 12/16/2012 Time: 4098-1191 PT Time Calculation (min): 27 min  PT Assessment / Plan / Recommendation History of Present Illness  57 year old male with history of PRES syndrome 2 months ago presentes to the hospital with HTN and AMS.  Patient was given 2 mg of IV ativan, seizure stopped but then vomited and aspirated.  EDP intubated. 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 Pt currenlty with AMS, weakness and HTN with mobility limiting current function. Above in addition to below deficits decrease pt function and independence and he will benefit from acute therapy to maximize mobility, function and safety to return pt to PLOF. Recommend daily mobility with nursing staff.     PT Assessment  Patient needs continued PT services    Follow Up Recommendations  Home health PT;Supervision/Assistance - 24 hour    Does the patient have the potential to tolerate intense rehabilitation      Barriers to Discharge Decreased caregiver support      Equipment Recommendations  Rolling walker with 5" wheels    Recommendations for Other Services     Frequency Min 3X/week    Precautions / Restrictions Precautions Precautions: Fall Precaution Comments: watch BP   Pertinent Vitals/Pain BP 161/93 (117) supine 199/101 (128) standing 176/92 (116) sitting end of session  93% on 3L with drop to 88 on RA at rest No pain      Mobility  Bed Mobility Bed Mobility: Rolling Right;Right Sidelying to Sit Rolling Right: 5: Supervision Right Sidelying to Sit: 5: Supervision;HOB flat Details for Bed Mobility Assistance: supervision for safety Transfers Transfers: Sit to Stand;Stand to Sit;Stand Pivot Transfers Sit to Stand: 4: Min assist;From bed Stand to Sit: 4: Min  guard;To chair/3-in-1;With armrests Stand Pivot Transfers: 4: Min guard Details for Transfer Assistance: cueing for hand placement and safety. Pt noted to be incontinent of stool on standing and assist for pericare. Pt with elevated BP in standing so pivoted to chair and deferred further mobility with RN aware. Static standing grossly 2 min Ambulation/Gait Ambulation/Gait Assistance: Not tested (comment)    Exercises     PT Diagnosis: Difficulty walking;Altered mental status  PT Problem List: Decreased cognition;Decreased activity tolerance;Decreased balance;Decreased mobility;Decreased knowledge of use of DME PT Treatment Interventions: Gait training;DME instruction;Stair training;Functional mobility training;Therapeutic activities;Therapeutic exercise;Patient/family education;Cognitive remediation;Balance training     PT Goals(Current goals can be found in the care plan section) Acute Rehab PT Goals Patient Stated Goal: return to fishing and walking.  PT Goal Formulation: With patient Time For Goal Achievement: 12/30/12 Potential to Achieve Goals: Good  Visit Information  Last PT Received On: 12/16/12 Assistance Needed: +1 History of Present Illness: 57 year old male with history of PRES syndrome 2 months ago presentes to the hospital with HTN and AMS.  Patient was given 2 mg of IV ativan, seizure stopped but then vomited and aspirated.  EDP intubated. Pt with ETT 11/4-11/8, 11/8-11/16       Prior Functioning  Home Living Family/patient expects to be discharged to:: Private residence Living Arrangements: Spouse/significant other Available Help at Discharge: Family;Available PRN/intermittently Type of Home: House Home Access: Stairs to enter [employer] of Steps: 3 Entrance Stairs-Rails: None Home Layout: One level Home Equipment: None Prior Function Level of Independence: Independent Communication Communication: No difficulties Dominant Hand: Right  Cognition  Cognition Arousal/Alertness: Awake/alert Behavior During Therapy: Flat affect Overall Cognitive Status: Impaired/Different from baseline Area of Impairment: Orientation;Problem solving Orientation Level: Situation;Time Memory: Decreased short-term memory Problem Solving: Requires verbal cues;Requires tactile cues;Slow processing    Extremity/Trunk Assessment Upper Extremity Assessment Upper Extremity Assessment: Overall WFL for tasks assessed Lower Extremity Assessment Lower Extremity Assessment: Generalized weakness Cervical / Trunk Assessment Cervical / Trunk Assessment: Normal   Balance Balance Balance Assessed: Yes Static Sitting Balance Static Sitting - Balance Support: No upper extremity supported;Feet supported Static Sitting - Level of Assistance: 6: Modified independent (Device/Increase time) Static Sitting - Comment/# of Minutes: 2 Static Standing Balance Static Standing - Balance Support: Bilateral upper extremity supported Static Standing - Level of Assistance: 5: Stand by assistance Static Standing - Comment/# of Minutes: 3  End of Session PT - End of Session Equipment Utilized During Treatment: Gait belt Activity Tolerance: Treatment limited secondary to medical complications (Comment) Patient left: in chair;with call bell/phone within reach Nurse Communication: Mobility status  GP     Juan Glenn 12/16/2012, 10:21 AM Juan Glenn, PT [phone]

## 2012-12-17 DIAGNOSIS — E119 Type 2 diabetes mellitus without complications: Secondary | ICD-10-CM

## 2012-12-17 LAB — CBC
MCH: 29.4 pg (ref 26.0–34.0)
MCHC: 33.7 g/dL (ref 30.0–36.0)
Platelets: 333 10*3/uL (ref 150–400)
RBC: 3.74 MIL/uL — ABNORMAL LOW (ref 4.22–5.81)

## 2012-12-17 LAB — BASIC METABOLIC PANEL
BUN: 21 mg/dL (ref 6–23)
Calcium: 8.2 mg/dL — ABNORMAL LOW (ref 8.4–10.5)
GFR calc Af Amer: 84 mL/min — ABNORMAL LOW (ref >90)
GFR calc non Af Amer: 72 mL/min — ABNORMAL LOW (ref >90)
Glucose, Bld: 203 mg/dL — ABNORMAL HIGH (ref 70–99)
Sodium: 149 mEq/L — ABNORMAL HIGH (ref 135–145)

## 2012-12-17 LAB — GLUCOSE, CAPILLARY
Glucose-Capillary: 254 mg/dL — ABNORMAL HIGH (ref 70–99)
Glucose-Capillary: 153 mg/dL — ABNORMAL HIGH (ref 70–99)
Glucose-Capillary: 150 mg/dL — ABNORMAL HIGH (ref 70–99)

## 2012-12-17 MED ORDER — ENSURE COMPLETE PO LIQD
237.0000 mL | Freq: Two times a day (BID) | ORAL | Status: DC
  Administered 2012-12-18: 237 mL via ORAL

## 2012-12-17 MED ORDER — POTASSIUM CHLORIDE CRYS ER 20 MEQ PO TBCR
40.0000 meq | EXTENDED_RELEASE_TABLET | Freq: Three times a day (TID) | ORAL | Status: AC
  Administered 2012-12-17 (×2): 40 meq via ORAL
  Filled 2012-12-17 (×2): qty 2

## 2012-12-17 NOTE — Progress Notes (Signed)
Seen and agree with SPT note Ryley Bachtel Tabor Yen Wandell, PT [phone]  

## 2012-12-17 NOTE — Progress Notes (Signed)
NUTRITION FOLLOW UP  Intervention:   1.  General healthful diet; encourage intake as able.   2.  Supplements; Ensure Complete po BID, each supplement provides 350 kcal and 13 grams of protein.  Nutrition Dx:   Inadequate oral intake, ongoing.   Monitor:   1. Enteral nutrition; initiation with tolerance. Enteral nutrition to provide 60-70% of estimated calorie needs (22-25 kcals/kg ideal body weight) and 100% of estimated protein needs, based on ASPEN guidelines for permissive underfeeding in critically ill obese individuals. No longer appropriate, discontinue.  2. Wt/wt change; monitor trends. Ongoing.  3.  Food/Beverage; pt meeting >/=90% estimated needs with tolerance.  Assessment:   Pt admitted with acute respiratory failure. Pt with h/o PRES syndrome 2 months ago. Pt vomiting yesterday, with aspiration pneumonia.  Pt has been extubated and diet advanced to CHO Mod Medium. PO intake has been poor at 25% of meals.  Constipation has improved.   Pt working with PT at time of visit.  RD to order supplements and will continue to follow for PO adequacy.   Height: Ht Readings from Last 1 Encounters:  12/05/12 5\' 9"  (1.753 m)    Weight Status:   Wt Readings from Last 1 Encounters:  12/17/12 201 lb 4.5 oz (91.3 kg)    Re-estimated needs:  Kcal: 2035-2320 Protein: 87-105g Fluid: >2.0 L/day  Skin: Abrasion to hand  Diet Order: Carb Control   Intake/Output Summary (Last 24 hours) at 12/17/12 1128 Last data filed at 12/17/12 0800  Gross per 24 hour  Intake    750 ml  Output   1156 ml  Net   -406 ml    Last BM: 11/17  Labs:   Recent Labs Lab 12/15/12 0400 12/16/12 0500 12/17/12 0505  NA 152* 154* 149*  K 4.2 3.9 3.5  CL 116* 118* 115*  CO2 26 25 23   BUN 33* 28* 21  CREATININE 1.28 1.27 1.10  CALCIUM 8.4 8.4 8.2*  GLUCOSE 283* 189* 203*    CBG (last 3)   Recent Labs  12/16/12 1716 12/16/12 2131 12/17/12 0807  GLUCAP 204* 136* 194*    Scheduled  Meds: . antiseptic oral rinse  15 mL Mouth Rinse QID  . aspirin  325 mg Oral Daily  . atorvastatin  20 mg Oral q1800  . chlorhexidine  15 mL Mouth Rinse BID  . heparin subcutaneous  5,000 Units Subcutaneous Q8H  . insulin aspart  0-20 Units Subcutaneous TID WC  . insulin glargine  10 Units Subcutaneous QHS  . labetalol  200 mg Oral BID  . levofloxacin  750 mg Oral Daily  . pantoprazole  40 mg Oral Daily  . piperacillin-tazobactam (ZOSYN)  IV  3.375 g Intravenous Q8H  . potassium chloride  40 mEq Oral TID    Continuous Infusions: . sodium chloride 10 mL/hr (12/16/12 1520)    Loyce Dys, MS RD LDN Clinical Inpatient Dietitian Pager: [phone] Weekend/After hours pager: [phone]

## 2012-12-17 NOTE — Progress Notes (Signed)
Inpatient Diabetes Program Recommendations  AACE/ADA: New Consensus Statement on Inpatient Glycemic Control (2013)  Target Ranges:  Prepandial:   less than 140 mg/dL      Peak postprandial:   less than 180 mg/dL (1-2 hours)      Critically ill patients:  140 - 180 mg/dL  Results for Juan Glenn, Juan Glenn (MRN [id number]) as of 12/17/2012 10:07  Ref. Range 12/16/2012 08:04 12/16/2012 12:19 12/16/2012 17:16 12/16/2012 21:31 12/17/2012 08:07  Glucose-Capillary Latest Range: 70-99 mg/dL 409 (H) 811 (H) 914 (H) 136 (H) 194 (H)   Inpatient Diabetes Program Recommendations Insulin - Basal: consider increasing Lantus to 15 units  Thank you  Piedad Climes BSN, RN,CDE Inpatient Diabetes Coordinator [phone] (team pager)

## 2012-12-17 NOTE — Progress Notes (Deleted)
SBP 72 with HR 112and then dc;ed from vent and bp rechedSBP 96 with MAP 75 and HR 107  Plan Reduce RR on vent from 15 to 10 Increase e time Continue 500cc vt (8cc) Recheck abg in 30 min  Dr. Kalman Shan, M.D., [facility].C.P Pulmonary and Critical Care Medicine Staff Physician [facility] Pager: [phone], If no answer or between  15:00h - 7:00h: call [phone]  12/17/2012 8:18 PM

## 2012-12-17 NOTE — Evaluation (Signed)
Occupational Therapy Evaluation Patient Details Name: Juan Glenn MRN: [id number] DOB: 1955/06/21 Today's Date: 12/17/2012 Time: 4098-1191 OT Time Calculation (min): 34 min  OT Assessment / Plan / Recommendation History of present illness 57 year old male with history of PRES syndrome 2 months ago presentes to the hospital with HTN and AMS.  Patient was given 2 mg of IV ativan, seizure stopped but then vomited and aspirated.  EDP intubated. Pt with ETT 11/4-11/8, 11/8-11/16   Clinical Impression   Pt admitted with above.  He presents to OT with impaired balance, deficits with cognition.  Unable to fully assess vision today due to pt without contacts; and demonstrates decreased activity tolerance.  Pt will benefit from continued OT to maximize safety and independence with BADLs to allow him to return home with wife and 24 hour supervision     OT Assessment  Patient needs continued OT Services    Follow Up Recommendations  Home health OT;Supervision/Assistance - 24 hour    Barriers to Discharge      Equipment Recommendations  Tub/shower seat    Recommendations for Other Services    Frequency  Min 2X/week    Precautions / Restrictions Precautions Precautions: Fall Precaution Comments: watch BP   Pertinent Vitals/Pain 167/74   ADL  Eating/Feeding: Set up Where Assessed - Eating/Feeding: Chair Grooming: Wash/dry hands;Wash/dry face;Brushing hair;Supervision/safety Where Assessed - Grooming: Supported sitting Upper Body Bathing: Minimal assistance Where Assessed - Upper Body Bathing: Supported sitting Lower Body Bathing: Moderate assistance Where Assessed - Lower Body Bathing: Supported sit to stand Upper Body Dressing: Minimal assistance Where Assessed - Upper Body Dressing: Unsupported sitting Lower Body Dressing: Minimal assistance Where Assessed - Lower Body Dressing: Supported sit to stand Toilet Transfer: Minimal assistance Toilet Transfer Method: Sit to  stand;Stand pivot Toilet Transfer Equipment: Regular height toilet;Comfort height toilet Toileting - Clothing Manipulation and Hygiene: Minimal assistance Where Assessed - Toileting Clothing Manipulation and Hygiene: Standing Transfers/Ambulation Related to ADLs: min guard to min A for balance.  As he fatigues, requires min A ADL Comments: Pt fatigues quickly.  Requires assist due to fatigue and balance.     OT Diagnosis: Generalized weakness;Cognitive deficits;Disturbance of vision  OT Problem List: Decreased strength;Decreased activity tolerance;Impaired balance (sitting and/or standing);Impaired vision/perception;Decreased coordination;Decreased cognition;Decreased safety awareness;Decreased knowledge of use of DME or AE OT Treatment Interventions: Self-care/ADL training;DME and/or AE instruction;Therapeutic activities;Cognitive remediation/compensation;Visual/perceptual remediation/compensation;Patient/family education;Balance training   OT Goals(Current goals can be found in the care plan section) Acute Rehab OT Goals Patient Stated Goal: To get better OT Goal Formulation: With patient Time For Goal Achievement: 12/31/12 Potential to Achieve Goals: Good ADL Goals Pt Will Perform Grooming: with supervision;standing Pt Will Perform Upper Body Bathing: with set-up;sitting Pt Will Perform Lower Body Bathing: with supervision;sit to/from stand Pt Will Perform Upper Body Dressing: with set-up;sitting Pt Will Perform Lower Body Dressing: with supervision;sit to/from stand Pt Will Transfer to Toilet: with supervision;ambulating;regular height toilet;grab bars Pt Will Perform Toileting - Clothing Manipulation and hygiene: with supervision;sit to/from stand Pt Will Perform Tub/Shower Transfer: Shower transfer;with supervision;shower seat;ambulating Additional ADL Goal #1: Pt will visually be able to locate all items needed for BADLs, and will read without difficulty  Visit Information  Last  OT Received On: 12/17/12 Assistance Needed: +1 History of Present Illness: 57 year old male with history of PRES syndrome 2 months ago presentes to the hospital with HTN and AMS.  Patient was given 2 mg of IV ativan, seizure stopped but then vomited and aspirated.  EDP  intubated. Pt with ETT 11/4-11/8, 11/8-11/16       Prior Functioning     Home Living Family/patient expects to be discharged to:: Private residence Living Arrangements: Spouse/significant other Available Help at Discharge: Family;Available PRN/intermittently Type of Home: House Home Access: Stairs to enter [employer] of Steps: 3 Entrance Stairs-Rails: None Home Layout: One level Home Equipment: None Prior Function Level of Independence: Independent Comments: Pt worked full time at [employer], drove, and was I with IADLs Communication Communication: No difficulties Dominant Hand: Right         Vision/Perception Vision - History Baseline Vision: Wears contacts Patient Visual Report: No change from baseline Vision - Assessment Eye Alignment: Within Functional Limits Vision Assessment: Vision tested Ocular Range of Motion: Within Functional Limits Tracking/Visual Pursuits: Other (comment) (loses object on Lt - has to initiate saccades) Visual Fields: No apparent deficits Additional Comments: Pt does not have contacts in, and is dependent on  contacts for reading and distant vision.  Pt unable to read clock on wall without contacts Perception Perception: Within Functional Limits Praxis Praxis: Intact   Cognition  Cognition Arousal/Alertness: Awake/alert Behavior During Therapy: Flat affect Overall Cognitive Status: Impaired/Different from baseline Area of Impairment: Orientation;Problem solving;Attention;Awareness Orientation Level: Time;Situation Current Attention Level: Sustained Memory: Decreased short-term memory Problem Solving: Slow processing;Difficulty sequencing;Requires  verbal cues;Requires tactile cues General Comments: Pt with decreased awareness of situation.  Requires mod verbal cues for problem solving     Extremity/Trunk Assessment Upper Extremity Assessment Upper Extremity Assessment: RUE deficits/detail;LUE deficits/detail RUE Deficits / Details: shoulder 4/5; elbow distally 4+/5 - 5/5 RUE Sensation: decreased light touch (finger tips) RUE Coordination: decreased fine motor LUE Deficits / Details: shoulder 4+/5; elbow distally 4+/5-5/5 LUE Sensation: decreased light touch (finger tips) Lower Extremity Assessment Lower Extremity Assessment: Defer to PT evaluation Cervical / Trunk Assessment Cervical / Trunk Assessment: Normal     Mobility Bed Mobility Bed Mobility: Not assessed Transfers Transfers: Sit to Stand;Stand to Sit Sit to Stand: 4: Min assist;With upper extremity assist;From chair/3-in-1 Stand to Sit: 4: Min guard;With upper extremity assist;To chair/3-in-1 Details for Transfer Assistance: assist for balance     Exercise     Balance Balance Balance Assessed: Yes Static Sitting Balance Static Sitting - Balance Support: No upper extremity supported;Feet supported Static Sitting - Level of Assistance: 6: Modified independent (Device/Increase time) Static Standing Balance Static Standing - Balance Support: No upper extremity supported Static Standing - Level of Assistance: 4: Min assist   End of Session OT - End of Session Activity Tolerance: Patient limited by fatigue Patient left: in chair;with call bell/phone within reach;with family/visitor present  GO     Alegandro Macnaughton M 12/17/2012, 11:54 AM

## 2012-12-17 NOTE — Progress Notes (Signed)
Physical Therapy Treatment Patient Details Name: Juan Glenn MRN: [id number] DOB: [birth year] Today's Date: 12/17/2012 Time: 0865-7846 PT Time Calculation (min): 30 min  PT Assessment / Plan / Recommendation  History of Present Illness 57 year old male with history of PRES syndrome 2 months ago presentes to the hospital with HTN and AMS.  Patient was given 2 mg of IV ativan, seizure stopped but then vomited and aspirated.  EDP intubated. Pt with ETT 11/4-11/8, 11/8-11/16   PT Comments   Patient tolerated ambulation and general LE exercise well. Complains of fatigue and weakness with activity, however today BP and HR were WFL with minimal increase with activity. His wife will be able to stay home from work to assist him at home once cleared by the MD and she is interested in some caregiver instruction in order to feel confident in helping him with his activities at home.  Follow Up Recommendations  Home health PT     Does the patient have the potential to tolerate intense rehabilitation     Barriers to Discharge        Equipment Recommendations  Rolling walker with 5" wheels    Recommendations for Other Services    Frequency Min 3X/week   Progress towards PT Goals Progress towards PT goals:  Plan Current plan remains appropriate    Precautions / Restrictions Precautions Precautions: Fall Precaution Comments: Restrictions Weight Bearing Restrictions: No   Pertinent Vitals/Pain No report of pain today. Seated resting HR 100, BP 154/80; after activity seated HR 106 BP 167/74.   Mobility  Bed Mobility Bed Mobility: Not assessed Transfers Transfers: Sit to Stand;Stand to Sit Sit to Stand: 4: Min assist;From chair/3-in-1 Stand to Sit: 4: Min guard;With upper extremity assist;To chair/3-in-1 Details for Transfer Assistance: Cues to lean trunk fwd to stand; assist for balance. Ambulation/Gait Ambulation/Gait Assistance: 4: Min assist Ambulation Distance (Feet): 160  Feet Assistive device: Rolling walker Ambulation/Gait Assistance Details: assist for balance; cues for posture; Gait Pattern: Decreased stride length;Step-through pattern;Trunk flexed Gait velocity: decreased General Gait Details: Pt c/o fatigue and general weakness during amb. Stairs: No    Exercises General Exercises - Lower Extremity Long Arc Quad: AROM;Both;10 reps Hip ABduction/ADduction: AROM;Both;10 reps Hip Flexion/Marching: 15 reps;Both;AROM Toe Raises: AROM;Both;15 reps Heel Raises: AROM;Both;15 reps   PT Diagnosis:    PT Problem List:   PT Treatment Interventions:     PT Goals (current goals can now be found in the care plan section) Acute Rehab PT Goals Patient Stated Goal: To get better and return home. PT Goal Formulation: With patient/family Time For Goal Achievement: 12/30/12 Potential to Achieve Goals: Good  Visit Information  Last PT Received On: 12/17/12 Assistance Needed: +1 History of Present Illness: 57 year old male with history of PRES syndrome 2 months ago presentes to the hospital with HTN and AMS.  Patient was given 2 mg of IV ativan, seizure stopped but then vomited and aspirated.  EDP intubated. Pt with ETT 11/4-11/8, 11/8-11/16    Subjective Data  Patient Stated Goal: To get better and return home.   Cognition  Cognition Arousal/Alertness: Awake/alert Behavior During Therapy: Flat affect Overall Cognitive Status: Impaired/Different from baseline Area of Impairment: Orientation Orientation Level: Disoriented to;Time      Balance  Balance Balance Assessed: Yes Static Standing Balance Static Standing - Balance Support: Left upper extremity supported;During functional activity (Pt stood in RW to perform self pericare with R UE.) Static Standing - Level of Assistance: 5: Stand by assistance Static Standing -  Comment/# of Minutes: 2  End of Session PT - End of Session Equipment Utilized During Treatment: Gait belt Activity Tolerance: Patient  limited by fatigue Patient left: in chair;with call bell/phone within reach;with family/visitor present   GP     Willette Pa, SPT 12/17/2012, 2:41 PM

## 2012-12-17 NOTE — Progress Notes (Signed)
Pt arrived on floor. Vital signs stable. Pt resting comfortably in bed with wife at bedside. Will continue to monitor.

## 2012-12-17 NOTE — Progress Notes (Signed)
Report received from [place], RN on 2H. Awaiting patient arrival.

## 2012-12-17 NOTE — Progress Notes (Signed)
PULMONARY  / CRITICAL CARE MEDICINE  Name: Juan Glenn MRN: [id number] DOB: 07/24/1955    ADMISSION DATE:  12/05/2012 CONSULTATION DATE:  11/36/2014  REFERRING MD :  EDP PRIMARY SERVICE: PCCM  CHIEF COMPLAINT:  Seizure and HTN  BRIEF PATIENT DESCRIPTION: 57 yo with history of PRES syndrome 2 months ago admitted with hypertensive crisis, encephalopathy and G neg aspiration pneumonia.  SIGNIFICANT EVENTS / STUDIES:  11/6   Admitted with hypertensive crisis, encephalopathy and possible aspiration 11/6   TTE >>> EF 60%, no effusion 11/8   Self extubated, reintubated 11/11 New fever, septic workup sent 11/13 Antibiotics switched 11/14 Antibiotic switched again per ID Pharmacist recs 11/17 EEG >>  LINES / TUBES: ETT 11/6 >>>11/8 (self extubated) >>> 11/8>>>11/16 R IJ TLC 11/6 >>>  CULTURES: Blood 11/6 >>> neg Urine 11/6 >>> neg Sputum 11/6 >>> CANDIDA Blood 11/11 >>>neg Urine 11/10 >>> neg Sputum 11/10 >>> >>ENTEROBACTER AEROGENES  ANTIBIOTICS: Unasyn 11/6 >>> 11/12 Vancomycin 11/6 >>>11/9 Cefepime 11/12 >>> 11/114 Flagyl 11/12 >>>11/16 Zosyn 11/14 >>> Levaquin 11/14 >>>  SUBJECTIVE: Denies cough, chest pain, abd pain.  VITAL SIGNS: Temp:  [97.8 F (36.6 C)-100 F (37.8 C)] 99.9 F (37.7 C) (11/18 0800) Pulse Rate:  [89-127] 89 (11/18 0015) Resp:  [20-40] 20 (11/18 0800) BP: (131-152)/(77-89) 149/82 mmHg (11/18 0800) SpO2:  [94 %-100 %] 99 % (11/18 0800) Weight:  [91.3 kg (201 lb 4.5 oz)] 91.3 kg (201 lb 4.5 oz) (11/18 0415)  3 liters [place] INTAKE / OUTPUT: Intake/Output     11/17 0701 - 11/18 0700 11/18 0701 - 11/19 0700   P.O. 840    I.V. (mL/kg) 90 (1)    IV Piggyback 100    Total Intake(mL/kg) 1030 (11.3)    Urine (mL/kg/hr) 1303 (0.6) 450 (1.4)   Stool 4 (0)    Total Output 1307 450   Net -277 -450        Urine Occurrence  1 x   Stool Occurrence  1 x    PHYSICAL EXAMINATION: General: sitting in chair, comfortable and following  command. Neuro: follows commands HEENT: no sinus tenderness Cardiovascular:  Tachycardic, regular Lungs: scattered rhonchi Abdomen:  Soft, nontender Musculoskeletal: no edema Skin: no rashes  CBC  Recent Labs Lab 12/15/12 0400 12/16/12 0500 12/17/12 0505  WBC 11.8* 12.0* 11.9*  HGB 10.8* 10.7* 11.0*  HCT 32.7* 32.3* 32.6*  PLT 268 304 333   BMET  Recent Labs Lab 12/15/12 0400 12/16/12 0500 12/17/12 0505  NA 152* 154* 149*  K 4.2 3.9 3.5  CL 116* 118* 115*  CO2 26 25 23   BUN 33* 28* 21  CREATININE 1.28 1.27 1.10  GLUCOSE 283* 189* 203*   Electrolytes  Recent Labs Lab 12/15/12 0400 12/16/12 0500 12/17/12 0505  CALCIUM 8.4 8.4 8.2*   ABG  Recent Labs Lab 12/11/12 1020 12/15/12 0830  PHART 7.415 7.423  PCO2ART 36.7 43.3  PO2ART 92.0 66.0*   Glucose  Recent Labs Lab 12/16/12 0445 12/16/12 0804 12/16/12 1219 12/16/12 1716 12/16/12 2131 12/17/12 0807  GLUCAP 165* 208* 266* 204* 136* 194*   CXR:  Dg Chest Port 1 View  12/16/2012   CLINICAL DATA:  Followup pneumonia  EXAM: PORTABLE CHEST - 1 VIEW  COMPARISON:  12/15/2012  FINDINGS: Patchy airspace consolidation in the right upper lobe is stable. Left lower lung zone opacity appears mildly improved despite low lung volumes accentuating lung base bronchovascular markings. No new lung opacities. No obvious pleural effusion and no pneumothorax.  Right internal  jugular central venous catheter is stable. The endotracheal tube and nasogastric tube have been removed.  IMPRESSION: 1. Right upper lobe airspace lung opacity is stable. Mild improvement in the opacity at the left lung base. No new lung opacities. Findings consistent with improving multifocal pneumonia. 2. Status post removal of the endotracheal and nasogastric tubes.   Electronically Signed   By: Amie Portland M.D.   On: 12/16/2012 07:21   ASSESSMENT / PLAN:  PULMONARY A: Acute respiratory failure in setting of seizure and aspiration.  Aspiration  pneumonia.  P:   - Oxygen to keep SpO2 > 92%. - F/u CXR intermittently. - Bronchial hygiene.  CARDIOVASCULAR A:   Hx of HTN, hyperlipidema. P:  - Continue ASA, labetalol. - PRN hydralazine. - Resume crestor >> use lipitor while in hospital.  RENAL A:  AKI - resolve.  Metabolic acidosis- resolved. Contraction alkalosis, hypernatremia -  2nd to diuresis. P:   - Hold lasix for now, auto-diureses. - Monitor renal fx, urine outpt, electrolytes. - Even fluid balance.  GASTROINTESTINAL A:   Nutrition. Constipation - improved. P:   - Advance diet as tolerated. - Protonix for SUP. - Bowel regimen.  HEMATOLOGIC A:   Anemia.  Thrombocytopenia - resolve. P: - F/u CBC. - SQ heparin for DVT prevention.  INFECTIOUS A:  Aspiration pneumonia (Enterobacter) >> d/w ID pharmacist 11/14 >> recommend continue double Abx coverage due to inducible resistance with Enterobacter. P:   - Day 13 of Abx, Day 4 of levaquin/zosyn needs a total of 8 days of levaquin/zosyn. - D/ced CVL, obtain PIV.  ENDOCRINE A:   DM type II with hyperglycemia. P:   - SSI with lantus.  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  Recent admission for PRESS. Acute encephalopathy >> resolved. Deconditioning. P:   - F/u EEG 11/17 WNL. - PT/OT evaluation.  Transfer to tele and to [facility], PCCM will sign off, please call back if needed.  Alyson Reedy, M.D. [facility] Pulmonary/Critical Care Medicine. Pager: [phone]. After hours pager: [phone].

## 2012-12-18 ENCOUNTER — Emergency Department (HOSPITAL_COMMUNITY): Payer: BC Managed Care – PPO

## 2012-12-18 ENCOUNTER — Encounter (HOSPITAL_COMMUNITY): Payer: Self-pay | Admitting: Emergency Medicine

## 2012-12-18 ENCOUNTER — Inpatient Hospital Stay (HOSPITAL_COMMUNITY)
Admission: EM | Admit: 2012-12-18 | Discharge: 2013-01-01 | DRG: 208 | Disposition: A | Discharge status: Skilled Nursing Facility | Payer: BC Managed Care – PPO | Attending: Internal Medicine | Admitting: Internal Medicine

## 2012-12-18 DIAGNOSIS — R4587 Impulsiveness: Secondary | ICD-10-CM | POA: Diagnosis not present

## 2012-12-18 DIAGNOSIS — J9600 Acute respiratory failure, unspecified whether with hypoxia or hypercapnia: Secondary | ICD-10-CM | POA: Diagnosis present

## 2012-12-18 DIAGNOSIS — I2699 Other pulmonary embolism without acute cor pulmonale: Principal | ICD-10-CM

## 2012-12-18 DIAGNOSIS — J80 Acute respiratory distress syndrome: Secondary | ICD-10-CM

## 2012-12-18 DIAGNOSIS — R9089 Other abnormal findings on diagnostic imaging of central nervous system: Secondary | ICD-10-CM

## 2012-12-18 DIAGNOSIS — J9589 Other postprocedural complications and disorders of respiratory system, not elsewhere classified: Secondary | ICD-10-CM

## 2012-12-18 DIAGNOSIS — N170 Acute kidney failure with tubular necrosis: Secondary | ICD-10-CM | POA: Diagnosis not present

## 2012-12-18 DIAGNOSIS — J690 Pneumonitis due to inhalation of food and vomit: Secondary | ICD-10-CM | POA: Diagnosis present

## 2012-12-18 DIAGNOSIS — F411 Generalized anxiety disorder: Secondary | ICD-10-CM | POA: Diagnosis not present

## 2012-12-18 DIAGNOSIS — I129 Hypertensive chronic kidney disease with stage 1 through stage 4 chronic kidney disease, or unspecified chronic kidney disease: Secondary | ICD-10-CM | POA: Diagnosis present

## 2012-12-18 DIAGNOSIS — T502X5A Adverse effect of carbonic-anhydrase inhibitors, benzothiadiazides and other diuretics, initial encounter: Secondary | ICD-10-CM | POA: Diagnosis not present

## 2012-12-18 DIAGNOSIS — R569 Unspecified convulsions: Secondary | ICD-10-CM | POA: Diagnosis present

## 2012-12-18 DIAGNOSIS — D649 Anemia, unspecified: Secondary | ICD-10-CM | POA: Diagnosis present

## 2012-12-18 DIAGNOSIS — Z7982 Long term (current) use of aspirin: Secondary | ICD-10-CM

## 2012-12-18 DIAGNOSIS — I10 Essential (primary) hypertension: Secondary | ICD-10-CM

## 2012-12-18 DIAGNOSIS — I824Z9 Acute embolism and thrombosis of unspecified deep veins of unspecified distal lower extremity: Secondary | ICD-10-CM | POA: Diagnosis present

## 2012-12-18 DIAGNOSIS — R930 Abnormal findings on diagnostic imaging of skull and head, not elsewhere classified: Secondary | ICD-10-CM

## 2012-12-18 DIAGNOSIS — R1313 Dysphagia, pharyngeal phase: Secondary | ICD-10-CM | POA: Diagnosis present

## 2012-12-18 DIAGNOSIS — R413 Other amnesia: Secondary | ICD-10-CM | POA: Diagnosis not present

## 2012-12-18 DIAGNOSIS — R339 Retention of urine, unspecified: Secondary | ICD-10-CM | POA: Diagnosis not present

## 2012-12-18 DIAGNOSIS — E1169 Type 2 diabetes mellitus with other specified complication: Secondary | ICD-10-CM | POA: Diagnosis present

## 2012-12-18 DIAGNOSIS — N058 Unspecified nephritic syndrome with other morphologic changes: Secondary | ICD-10-CM

## 2012-12-18 DIAGNOSIS — E1129 Type 2 diabetes mellitus with other diabetic kidney complication: Secondary | ICD-10-CM | POA: Diagnosis present

## 2012-12-18 DIAGNOSIS — E785 Hyperlipidemia, unspecified: Secondary | ICD-10-CM | POA: Diagnosis present

## 2012-12-18 DIAGNOSIS — N183 Chronic kidney disease, stage 3 unspecified: Secondary | ICD-10-CM | POA: Diagnosis present

## 2012-12-18 DIAGNOSIS — E870 Hyperosmolality and hypernatremia: Secondary | ICD-10-CM | POA: Diagnosis not present

## 2012-12-18 DIAGNOSIS — E871 Hypo-osmolality and hyponatremia: Secondary | ICD-10-CM | POA: Diagnosis not present

## 2012-12-18 DIAGNOSIS — R509 Fever, unspecified: Secondary | ICD-10-CM

## 2012-12-18 DIAGNOSIS — G9340 Encephalopathy, unspecified: Secondary | ICD-10-CM | POA: Diagnosis not present

## 2012-12-18 DIAGNOSIS — R55 Syncope and collapse: Secondary | ICD-10-CM

## 2012-12-18 LAB — COMPREHENSIVE METABOLIC PANEL
Albumin: 2.3 g/dL — ABNORMAL LOW (ref 3.5–5.2)
Alkaline Phosphatase: 77 U/L (ref 39–117)
BUN: 15 mg/dL (ref 6–23)
CO2: 20 mEq/L (ref 19–32)
Chloride: 108 mEq/L (ref 96–112)
Creatinine, Ser: 0.98 mg/dL (ref 0.50–1.35)
GFR calc Af Amer: >90 mL/min (ref >90)
GFR calc non Af Amer: 89 mL/min — ABNORMAL LOW (ref >90)
Glucose, Bld: 201 mg/dL — ABNORMAL HIGH (ref 70–99)
Potassium: 3.9 mEq/L (ref 3.5–5.1)
Total Bilirubin: 0.3 mg/dL (ref 0.3–1.2)

## 2012-12-18 LAB — BASIC METABOLIC PANEL
BUN: 16 mg/dL (ref 6–23)
Calcium: 8.3 mg/dL — ABNORMAL LOW (ref 8.4–10.5)
GFR calc Af Amer: 75 mL/min — ABNORMAL LOW (ref >90)
GFR calc non Af Amer: 65 mL/min — ABNORMAL LOW (ref >90)
Glucose, Bld: 181 mg/dL — ABNORMAL HIGH (ref 70–99)
Sodium: 146 mEq/L — ABNORMAL HIGH (ref 135–145)

## 2012-12-18 LAB — CBC WITH DIFFERENTIAL/PLATELET
Basophils Relative: 0 % (ref 0–1)
Eosinophils Relative: 1 % (ref 0–5)
HCT: 35 % — ABNORMAL LOW (ref 39.0–52.0)
Hemoglobin: 12.2 g/dL — ABNORMAL LOW (ref 13.0–17.0)
Lymphs Abs: 2.3 10*3/uL (ref 0.7–4.0)
MCH: 29.8 pg (ref 26.0–34.0)
MCV: 85.6 fL (ref 78.0–100.0)
Monocytes Absolute: 1.4 10*3/uL — ABNORMAL HIGH (ref 0.1–1.0)
Monocytes Relative: 9 % (ref 3–12)
Neutro Abs: 11.5 10*3/uL — ABNORMAL HIGH (ref 1.7–7.7)
Neutrophils Relative %: 75 % (ref 43–77)
Platelets: 402 10*3/uL — ABNORMAL HIGH (ref 150–400)
RBC: 4.09 MIL/uL — ABNORMAL LOW (ref 4.22–5.81)

## 2012-12-18 LAB — POCT I-STAT 3, ART BLOOD GAS (G3+)
Acid-base deficit: 1 mmol/L (ref 0.0–2.0)
Bicarbonate: 21.9 mEq/L (ref 20.0–24.0)
Bicarbonate: 23.2 mEq/L (ref 20.0–24.0)
Patient temperature: 101.4
Patient temperature: 39.2
TCO2: 24 mmol/L (ref 0–100)
pCO2 arterial: 38.8 mmHg (ref 35.0–45.0)
pH, Arterial: 7.233 — ABNORMAL LOW (ref 7.350–7.450)
pH, Arterial: 7.39 (ref 7.350–7.450)

## 2012-12-18 LAB — GLUCOSE, CAPILLARY: Glucose-Capillary: 174 mg/dL — ABNORMAL HIGH (ref 70–99)

## 2012-12-18 LAB — URINE MICROSCOPIC-ADD ON

## 2012-12-18 LAB — URINALYSIS, ROUTINE W REFLEX MICROSCOPIC
Glucose, UA: 250 mg/dL — AB
Ketones, ur: NEGATIVE mg/dL
Leukocytes, UA: NEGATIVE
Protein, ur: 30 mg/dL — AB
Urobilinogen, UA: 0.2 mg/dL (ref 0.0–1.0)

## 2012-12-18 LAB — PROTIME-INR
INR: 0.93 (ref 0.00–1.49)
Prothrombin Time: 12.3 seconds (ref 11.6–15.2)

## 2012-12-18 LAB — CBC
HCT: 33.4 % — ABNORMAL LOW (ref 39.0–52.0)
Hemoglobin: 11.3 g/dL — ABNORMAL LOW (ref 13.0–17.0)
MCH: 29.2 pg (ref 26.0–34.0)
MCHC: 33.8 g/dL (ref 30.0–36.0)

## 2012-12-18 LAB — PHOSPHORUS: Phosphorus: 3.2 mg/dL (ref 2.3–4.6)

## 2012-12-18 LAB — PRO B NATRIURETIC PEPTIDE: Pro B Natriuretic peptide (BNP): 342.7 pg/mL — ABNORMAL HIGH (ref 0–125)

## 2012-12-18 MED ORDER — VANCOMYCIN HCL IN DEXTROSE 1-5 GM/200ML-% IV SOLN: 1000.0000 mg | Freq: Three times a day (TID) | INTRAVENOUS | Status: DC

## 2012-12-18 MED ORDER — LABETALOL HCL 200 MG PO TABS: 200.0000 mg | ORAL_TABLET | Freq: Two times a day (BID) | ORAL | Status: AC

## 2012-12-18 MED ORDER — SODIUM CHLORIDE 0.9 % IV SOLN
1000.0000 mL | Freq: Once | INTRAVENOUS | Status: AC
  Administered 2012-12-18: 1000 mL via INTRAVENOUS

## 2012-12-18 MED ORDER — SUCCINYLCHOLINE CHLORIDE 20 MG/ML IJ SOLN
100.0000 mg | Freq: Once | INTRAMUSCULAR | Status: AC
  Administered 2012-12-18: 100 mg via INTRAVENOUS

## 2012-12-18 MED ORDER — SODIUM CHLORIDE 0.9 % IV SOLN
1000.0000 mL | INTRAVENOUS | Status: DC
  Administered 2012-12-18 – 2012-12-19 (×2): 1000 mL via INTRAVENOUS

## 2012-12-18 MED ORDER — ENSURE COMPLETE PO LIQD: 237.0000 mL | Freq: Two times a day (BID) | ORAL | Status: DC

## 2012-12-18 MED ORDER — VANCOMYCIN HCL IN DEXTROSE 1-5 GM/200ML-% IV SOLN: 1000.0000 mg | Freq: Once | INTRAVENOUS | Status: DC

## 2012-12-18 MED ORDER — VANCOMYCIN HCL 10 G IV SOLR
2000.0000 mg | INTRAVENOUS | Status: AC
  Administered 2012-12-18: 2000 mg via INTRAVENOUS
  Filled 2012-12-18: qty 2000

## 2012-12-18 MED ORDER — FENTANYL CITRATE 0.05 MG/ML IJ SOLN
50.0000 ug | Freq: Once | INTRAMUSCULAR | Status: AC
  Administered 2012-12-18: 50 ug via INTRAVENOUS
  Filled 2012-12-18: qty 2

## 2012-12-18 MED ORDER — ALBUTEROL SULFATE (5 MG/ML) 0.5% IN NEBU
5.0000 mg | INHALATION_SOLUTION | Freq: Once | RESPIRATORY_TRACT | Status: AC
  Administered 2012-12-18: 5 mg via RESPIRATORY_TRACT
  Filled 2012-12-18: qty 1

## 2012-12-18 MED ORDER — IPRATROPIUM BROMIDE 0.02 % IN SOLN
0.5000 mg | Freq: Once | RESPIRATORY_TRACT | Status: AC
  Administered 2012-12-18: 0.5 mg via RESPIRATORY_TRACT
  Filled 2012-12-18: qty 2.5

## 2012-12-18 MED ORDER — DEXTROSE 5 % IV SOLN
2.0000 g | Freq: Once | INTRAVENOUS | Status: AC
  Administered 2012-12-18: 2 g via INTRAVENOUS

## 2012-12-18 MED ORDER — DEXTROSE 5 % IV SOLN
1.0000 g | Freq: Three times a day (TID) | INTRAVENOUS | Status: DC
  Filled 2012-12-18: qty 1

## 2012-12-18 MED ORDER — PROPOFOL 10 MG/ML IV EMUL
5.0000 ug/kg/min | INTRAVENOUS | Status: DC
  Administered 2012-12-18: 30 ug/kg/min via INTRAVENOUS
  Administered 2012-12-19: 65 ug/kg/min via INTRAVENOUS
  Administered 2012-12-19 (×2): 40 ug/kg/min via INTRAVENOUS
  Administered 2012-12-19: 45 ug/kg/min via INTRAVENOUS
  Administered 2012-12-19: 60 ug/kg/min via INTRAVENOUS
  Administered 2012-12-19 (×2): 40 ug/kg/min via INTRAVENOUS
  Administered 2012-12-20: 25 ug/kg/min via INTRAVENOUS
  Administered 2012-12-20: 40 ug/kg/min via INTRAVENOUS
  Administered 2012-12-20: 10 ug/kg/min via INTRAVENOUS
  Administered 2012-12-21: 40 ug/kg/min via INTRAVENOUS
  Administered 2012-12-21: 10.073 ug/kg/min via INTRAVENOUS
  Administered 2012-12-21: 30 ug/kg/min via INTRAVENOUS
  Administered 2012-12-22 (×2): 40 ug/kg/min via INTRAVENOUS
  Filled 2012-12-18 (×16): qty 100

## 2012-12-18 MED ORDER — LEVOFLOXACIN 750 MG PO TABS: 750.0000 mg | ORAL_TABLET | Freq: Every day | ORAL | Status: DC

## 2012-12-18 MED ORDER — ETOMIDATE 2 MG/ML IV SOLN
20.0000 mg | Freq: Once | INTRAVENOUS | Status: AC
  Administered 2012-12-18: 20 mg via INTRAVENOUS

## 2012-12-18 MED ORDER — ACETAMINOPHEN 650 MG RE SUPP
650.0000 mg | Freq: Once | RECTAL | Status: AC
  Administered 2012-12-18: 650 mg via RECTAL
  Filled 2012-12-18: qty 1

## 2012-12-18 NOTE — ED Notes (Signed)
Pt and family aware of POC. RT at bedside. Will intubate patient due to work of breathing

## 2012-12-18 NOTE — Discharge Summary (Signed)
Physician Discharge Summary  Patient ID: Juan Glenn MRN: [id number] DOB/AGE: Jun 04, 1955 57 y.o.  Admit date: 12/05/2012 Discharge date: 12/18/2012    Discharge Diagnoses:  Seizure  Recent PRESS  Acute Encephalopathy Deconditioning Acute Respiratory Failure Aspiration Pneumonia (enterobacter aerogenes) HTN Hyperlipidemia Acute Kidney Injury Contraction Alkalosis Constipation Anemia Thrombocytopenia Diabetes Mellitus                                                                           DISCHARGE PLAN BY DIAGNOSIS    Seizure  Recent PRESS  Acute Encephalopathy Deconditioning  Discharge Plan: -follow up with Neurology as outpatient -no further anti-epileptics for now (seizure most likely related to acute hypertensive encephalopathy).  EEG with no increased risk of recurrent seizures.  -no driving until cleared by neurology  Acute Respiratory Failure Aspiration Pneumonia  Discharge Plan: -continue levaquin for 2 more days.  Discussed with ID pharmacist regarding narrow of abx.  -follow up with PCP  HTN Hyperlipidemia  Discharge Plan: -continue labetalol 200 mg PO BID -follow up with PCP regarding BP control  Acute Kidney Injury Contraction Alkalosis  Discharge Plan: -Resolved. PRN BMP  Constipation  Discharge Plan: -Resolved. OTC agent as needed.   Anemia Thrombocytopenia  Discharge Plan: -Resolved, follow up CBC with PCP as needed  Diabetes Mellitus  Discharge Plan: -continue previous home regimen                  DISCHARGE SUMMARY   Juan Glenn is a 57 y.o. y/o male with a PMH of HLD, DM, Difficult Intubation, and recent admission for PRESS syndrome who presented to [facility] ER on 11/6 with altered mental status, found to have significant hypertension and noted to have seizure activity that stopped with administration of IV ativan.  Patient also noted to have vomiting and presumed aspiration on admit.  He was  intubated for airway protection in the setting of acute encephalopathy and admitted to ICU for care.  CT of head performed with no acute abnormality.  TEE performed with EF of 60% and no noted pericardial effusion.  Blood pressure control instituted with gradual reduction over first 24 hour period with transition to oral regimen as below.  He self extubated on 11/8 and required reintubation.  Patient was noted to have new fever on 11/11 and was pan cultured.  Sputum demonstrated enterobacter aerogenes.  Patient was treated with IV abx as below.  Transitioned to oral regimen prior to discharge. Patient was evaluated by PT prior to d/c and recommended to have home PT, Rolling Walker & 3-in-1 Chair.  Patient has had no recurrence of seizure activity and is no longer on anti-epileptic medication. Seizure activity was most likely secondary to acute hypertensive encephalopathy. EEG showed no indications increased risk of recurrent seizures.       SIGNIFICANT EVENTS / STUDIES:  11/06 - Admitted with hypertensive crisis, encephalopathy and possible aspiration  11/06 - TTE >>> EF 60%, no effusion  11/08 - Self extubated, reintubated  11/11 -  New fever, septic workup sent  11/13 - Antibiotics switched  11/14 - Antibiotic switched again per ID Pharmacist recs  11/17 - EEG >>EEG is abnormal with mild generalized continuous nonspecific slowing of cerebral activity. This pattern of  swelling is nonspecific and can be seen with a wide variety of encephalopathic processes, including degenerative diseases as well as metabolic abnormalities. No evidence of an epileptic disorder was demonstrated.   LINES / TUBES:  ETT 11/6 >>>11/8 (self extubated) >>> 11/8>>>11/16 R IJ TLC 11/6 >>>   CULTURES:  Blood 11/6 >>> neg  Urine 11/6 >>> neg  Sputum 11/6 >>> CANDIDA  Blood 11/11 >>>neg  Urine 11/10 >>> neg  Sputum 11/10 >>> >>ENTEROBACTER AEROGENES   ANTIBIOTICS:  Unasyn 11/6 >>> 11/12  Vancomycin 11/6 >>>11/9   Cefepime 11/12 >>> 11/114  Flagyl 11/12 >>>11/16  Zosyn 11/14 >>>11/19 Levaquin 11/14 >>>11/21    Discharge Exam: General: sitting in chair, comfortable and following command.  Neuro: follows commands  HEENT: no sinus tenderness  Cardiovascular: Tachycardic, regular  Lungs: scattered rhonchi  Abdomen: Soft, nontender  Musculoskeletal: no edema  Skin: no rashes   Filed Vitals:   12/17/12 1335 12/17/12 2132 12/18/12 0418 12/18/12 1011  BP: 149/83 158/93 140/76   Pulse: 92 101 96   Temp: 98.3 F (36.8 C) 99.6 F (37.6 C) 99.2 F (37.3 C)   TempSrc: Oral Oral Oral   Resp: 18 18 18    Height:      Weight:   201 lb 12.8 oz (91.536 kg)   SpO2: 95% 93% 93% 100%    Discharge Labs  BMET  Recent Labs Lab 12/14/12 0900 12/15/12 0400 12/16/12 0500 12/17/12 0505 12/18/12 0541  NA 150* 152* 154* 149* 146*  K 4.3 4.2 3.9 3.5 3.6  CL 115* 116* 118* 115* 113*  CO2 26 26 25 23 21   GLUCOSE 221* 283* 189* 203* 181*  BUN 32* 33* 28* 21 16  CREATININE 1.13 1.28 1.27 1.10 1.20  CALCIUM 8.5 8.4 8.4 8.2* 8.3*  MG  --   --   --   --  2.2  PHOS  --   --   --   --  3.2   CBC  Recent Labs Lab 12/16/12 0500 12/17/12 0505 12/18/12 0541  HGB 10.7* 11.0* 11.3*  HCT 32.3* 32.6* 33.4*  WBC 12.0* 11.9* 10.0  PLT 304 333 356   Discharge Orders   Future Appointments Provider Department Dept Phone   01/21/2013 10:00 AM Glendale Chard, MD [facility] [phone]   Future Orders Complete By Expires   Call MD for:  difficulty breathing, headache or visual disturbances  As directed    Call MD for:  persistant dizziness or light-headedness  As directed    Call MD for:  persistant nausea and vomiting  As directed    Call MD for:  temperature >100.4  As directed    Diet - low sodium heart healthy  As directed    Discharge instructions  As directed    Comments:     1. Take your blood pressure medications as prescribed.  Do not abruptly stop medications.  2. Home  health PT will be visiting your house 3. Complete all of your antibiotics - next dose due 11/20   Driving Restrictions  As directed    Comments:     Until cleared by Neurology   Increase activity slowly  As directed         Medication List         aspirin EC 81 MG tablet  Take 1 tablet (81 mg total) by mouth daily.     CLARITIN PO  Take 1 tablet by mouth daily as needed (allergies).     feeding  supplement (ENSURE COMPLETE) Liqd  Take 237 mLs by mouth 2 (two) times daily between meals.     glimepiride 2 MG tablet  Commonly known as:  AMARYL  Take 2 mg by mouth 2 (two) times daily before a meal. Take 1 tablet (2 mg total) by mouth 2 (two) times daily before meals.     JENTADUETO 2.05-998 MG Tabs  Generic drug:  Linagliptin-Metformin HCl  1 tablet. Take 1 tablet by mouth 2 (two) times daily.     labetalol 200 MG tablet  Commonly known as:  NORMODYNE  Take 1 tablet (200 mg total) by mouth 2 (two) times daily.     levofloxacin 750 MG tablet  Commonly known as:  LEVAQUIN  Take 1 tablet (750 mg total) by mouth daily.     multivitamin with minerals Tabs tablet  Take 1 tablet by mouth daily.     rosuvastatin 10 MG tablet  Commonly known as:  CRESTOR  Take 10 mg by mouth daily.       Follow-up Information   Follow up with RECORD,CHARLES On 12/20/2012. (Appt at 10:30 )    Specialty:  Family Medicine   Contact information:   [address] [place] [address] [phone]       Follow up with Nita Sickle, MD On 01/21/2013. (Appt at 10:00.  Arrive at 9:45)    Specialty:  Neurology   Contact information:   [address] [place] [address] [phone]        Disposition: Home with home health PT, Rolling Walker, 3-in-1 chair.    Discharged Condition: Casper Pagliuca has met maximum benefit of inpatient care and is medically stable and cleared for discharge.  Patient is pending follow up as above.      Time spent on disposition:  Greater than  35 minutes.   Signed: Canary Brim, NP-C [facility] Pgr: [phone] Office: [phone]    agree with above findings.    excelent clnical progress, use monotherapy levo with good absorption to finish course abx  Mcarthur Rossetti. Tyson Alias, MD, FACP Pgr: [phone] [facility]

## 2012-12-18 NOTE — Progress Notes (Signed)
ANTIBIOTIC CONSULT NOTE - INITIAL  Pharmacy Consult for vancomycin + cefepime Indication: rule out sepsis  No Known Allergies  Patient Measurements:   Adjusted Body Weight:   Vital Signs: Temp src: Oral (11/19 2150) BP: 150/88 mmHg (11/19 2150) Pulse Rate: 113 (11/19 2150) Intake/Output from previous day:   Intake/Output from this shift: Total I/O In: -  Out: 250 [Urine:250]  Labs:  Recent Labs  12/16/12 0500 12/17/12 0505 12/18/12 0541  WBC 12.0* 11.9* 10.0  HGB 10.7* 11.0* 11.3*  PLT 304 333 356  CREATININE 1.27 1.10 1.20   The CrCl is unknown because both a height and weight (above a minimum accepted value) are required for this calculation. No results found for this basename: Rolm Gala, VANCORANDOM, GENTTROUGH, GENTPEAK, GENTRANDOM, TOBRATROUGH, TOBRAPEAK, TOBRARND, AMIKACINPEAK, AMIKACINTROU, AMIKACIN,  in the last 72 hours   Microbiology: Recent Results (from the past 720 hour(s))  URINE CULTURE     Status: None   Collection Time    12/05/12 12:41 AM      Result Value Range Status   Specimen Description URINE, RANDOM   Final   Special Requests Normal   Final   Culture  Setup Time     Final   Value: 12/05/2012 02:45     Performed at [facility] Count     Final   Value: NO GROWTH     Performed at [facility]   Culture     Final   Value: NO GROWTH     Performed at [facility]   Report Status 12/06/2012 FINAL   Final  MRSA PCR SCREENING     Status: None   Collection Time    12/05/12  4:47 AM      Result Value Range Status   MRSA by PCR NEGATIVE  NEGATIVE Final   Comment:            The GeneXpert MRSA Assay (FDA     approved for NASAL specimens     only), is one component of a     comprehensive MRSA colonization     surveillance program. It is not     intended to diagnose MRSA     infection nor to guide or     monitor treatment for     MRSA infections.  CULTURE, BLOOD (ROUTINE X 2)     Status: None    Collection Time    12/05/12  5:30 AM      Result Value Range Status   Specimen Description BLOOD RIGHT HAND   Final   Special Requests BOTTLES DRAWN AEROBIC ONLY 2CC   Final   Culture  Setup Time     Final   Value: 12/05/2012 09:24     Performed at [facility]   Culture     Final   Value: NO GROWTH 5 DAYS     Performed at [facility]   Report Status 12/11/2012 FINAL   Final  CULTURE, BLOOD (ROUTINE X 2)     Status: None   Collection Time    12/05/12  5:45 AM      Result Value Range Status   Specimen Description BLOOD RIGHT HAND   Final   Special Requests BOTTLES DRAWN AEROBIC ONLY 3CC   Final   Culture  Setup Time     Final   Value: 12/05/2012 09:24     Performed at [facility]   Culture     Final  Value: NO GROWTH 5 DAYS     Performed at [facility]   Report Status 12/11/2012 FINAL   Final  URINE CULTURE     Status: None   Collection Time    12/05/12  8:44 AM      Result Value Range Status   Specimen Description URINE, CATHETERIZED   Final   Special Requests NONE   Final   Culture  Setup Time     Final   Value: 12/05/2012 16:14     Performed at [facility]   Colony Count     Final   Value: NO GROWTH     Performed at [facility]   Culture     Final   Value: NO GROWTH     Performed at [facility]   Report Status 12/06/2012 FINAL   Final  CULTURE, RESPIRATORY (NON-EXPECTORATED)     Status: None   Collection Time    12/05/12  7:49 PM      Result Value Range Status   Specimen Description TRACHEAL ASPIRATE   Final   Special Requests NONE   Final   Gram Stain     Final   Value: ABUNDANT WBC PRESENT, PREDOMINANTLY PMN     RARE SQUAMOUS EPITHELIAL CELLS PRESENT     FEW GRAM POSITIVE COCCI     IN PAIRS RARE YEAST     Performed at [facility]   Culture     Final   Value: FEW CANDIDA ALBICANS     Performed at [facility]   Report Status 12/08/2012 FINAL   Final  URINE CULTURE     Status:  None   Collection Time    12/09/12  4:57 PM      Result Value Range Status   Specimen Description URINE, CATHETERIZED   Final   Special Requests NONE   Final   Culture  Setup Time     Final   Value: 12/09/2012 17:24     Performed at [facility]   Culture     Final   Value: NO GROWTH     Performed at [facility]   Report Status 12/10/2012 FINAL   Final  CULTURE, BLOOD (ROUTINE X 2)     Status: None   Collection Time    12/09/12  7:37 PM      Result Value Range Status   Specimen Description BLOOD ARM LEFT   Final   Special Requests BOTTLES DRAWN AEROBIC ONLY 2CC   Final   Culture  Setup Time     Final   Value: 12/10/2012 01:10     Performed at [facility]   Culture     Final   Value: NO GROWTH 5 DAYS     Performed at [facility]   Report Status 12/16/2012 FINAL   Final  CULTURE, BLOOD (ROUTINE X 2)     Status: None   Collection Time    12/09/12  7:42 PM      Result Value Range Status   Specimen Description BLOOD HAND LEFT   Final   Special Requests BOTTLES DRAWN AEROBIC ONLY 3CC   Final   Culture  Setup Time     Final   Value: 12/10/2012 01:10     Performed at [facility]   Culture     Final   Value: NO GROWTH 5 DAYS     Performed at [facility]   Report Status  12/16/2012 FINAL   Final  CULTURE, RESPIRATORY (NON-EXPECTORATED)     Status: None   Collection Time    12/09/12  8:14 PM      Result Value Range Status   Specimen Description TRACHEAL ASPIRATE   Final   Special Requests NONE   Final   Gram Stain     Final   Value: ABUNDANT WBC PRESENT,BOTH PMN AND MONONUCLEAR     RARE SQUAMOUS EPITHELIAL CELLS PRESENT     NO ORGANISMS SEEN     Performed at [facility]   Culture     Final   Value: FEW ENTEROBACTER AEROGENES     Performed at [facility]   Report Status 12/12/2012 FINAL   Final   Organism ID, Bacteria ENTEROBACTER AEROGENES   Final    Medical History: Past Medical History  Diagnosis  Date  . Diabetes mellitus without complication 2009  . Renal disorder   . Hyperlipidemia   . Difficult intubation     Medications:  Anti-infectives   Start     Dose/Rate Route Frequency Ordered Stop   12/19/12 0700  vancomycin (VANCOCIN) IVPB 1000 mg/200 mL premix     1,000 mg 200 mL/hr over 60 Minutes Intravenous Every 8 hours 12/18/12 2220     12/19/12 0700  ceFEPIme (MAXIPIME) 1 g in dextrose 5 % 50 mL IVPB     1 g 100 mL/hr over 30 Minutes Intravenous Every 8 hours 12/18/12 2220     12/18/12 2230  vancomycin (VANCOCIN) 2,000 mg in sodium chloride 0.9 % 500 mL IVPB     2,000 mg 250 mL/hr over 120 Minutes Intravenous To Emergency Dept 12/18/12 2216 12/19/12 2230   12/18/12 2215  ceFEPIme (MAXIPIME) 2 g in dextrose 5 % 50 mL IVPB     2 g 100 mL/hr over 30 Minutes Intravenous  Once 12/18/12 2203     12/18/12 2215  vancomycin (VANCOCIN) IVPB 1000 mg/200 mL premix  Status:  Discontinued     1,000 mg 200 mL/hr over 60 Minutes Intravenous  Once 12/18/12 2203 12/18/12 2216     Assessment: Juan Glenn discharged earlier today after treatment for pneumonia presents to the ED with tachypnea. To start empiric vancomycin + cefepime for possible sepsis. Tmax is 102.4 and WBC is WNL. Scr is 1.2 as of this AM.   Goal of Therapy:  Vancomycin trough level 15-20 mcg/ml  Plan:  1. Vancomycin 2gm IV x 1 then 1gm IV Q8H 2. Cefepime 2gm IV x 1 then 1gm IV Q8H 3. F/u renal fxn, C&S, clinical status and trough at [facility]  Alonso Gapinski, Drake Leach 12/18/2012,10:20 PM

## 2012-12-18 NOTE — ED Provider Notes (Signed)
CSN: [id number]     Arrival date & time 12/18/12  2119 History   First MD Initiated Contact with Patient 12/18/12 2147     Chief Complaint  Patient presents with  . Code Sepsis   (Consider location/radiation/quality/duration/timing/severity/associated sxs/prior Treatment) HPI History limited by patient condition and critical acuity due to respiratory distress.   Here for shortness of breath in context of recently being discharged from hospital for pneumonia. Onset was gradual, worsening.  The problem is described as severe by family.  Associated symptoms: fever, cough.   Past Medical History  Diagnosis Date  . Diabetes mellitus without complication 2009  . Renal disorder   . Hyperlipidemia   . Difficult intubation    Past Surgical History  Procedure Laterality Date  . No surgical history  09/2012   Family History  Problem Relation Age of Onset  . Diabetes Mother   . Congestive Heart Failure Mother     Died, 35s  . Heart attack Father     Died, 11  . Diabetes Brother   . Hypertension Sister   . Cancer Brother     Died, 3   History  Substance Use Topics  . Smoking status: Never Smoker   . Smokeless tobacco: Never Used  . Alcohol Use: No    Review of Systems History limited by patient condition and critical acuity due to respiratory distress.  Allergies  Review of patient's allergies indicates no known allergies.  Home Medications   No current outpatient prescriptions on file. BP 151/89  Pulse 109  Temp(Src) 102.7 F (39.3 C) (Core (Comment))  Resp 20  Ht 5\' 8"  (1.727 m)  Wt 210 lb 1.6 oz (95.3 kg)  BMI 31.95 kg/m2  SpO2 94% Physical Exam Nursing note and vitals reviewed.  Constitutional: Pt is awake and appears stated age. Eyes: No injection, no scleral icterus. HENT: Atraumatic, airway open without erythema or exudate.  Respiratory: Respiratory distress, tachypnea, accessory muscle use. Coarse breath sounds. Equal breathing  bilaterally. Cardiovascular: Tachycardic rate. Extremities warm and well perfused.  Abdomen: Soft, non-tender. MSK: Extremities are atraumatic without deformity. Skin: No rash, no wounds.   Neuro: No motor nor sensory deficit.     ED Course  INTUBATION Performed by: Charm Barges Authorized by: Glynn Octave Consent: Verbal consent obtained. The procedure was performed in an emergent situation. Consent given by: spouse Patient identity confirmed: arm band and provided demographic data Indications: respiratory distress Intubation method: video-assisted Patient status: paralyzed (RSI) Preoxygenation: bipap. Sedatives: etomidate Paralytic: succinylcholine Laryngoscope size: Mac 4 Tube size: 7.5 mm Tube type: cuffed Number of attempts: 1 Cords visualized: yes Post-procedure assessment: chest rise and ETCO2 monitor Breath sounds: equal Cuff inflated: yes ETT to lip: 24 cm Tube secured with: ETT holder Chest x-ray findings: endotracheal tube in appropriate position Patient tolerance: Patient tolerated the procedure well with no immediate complications.   (including critical care time) Labs Review Labs Reviewed  PRO B NATRIURETIC PEPTIDE - Abnormal; Notable for the following:    Pro B Natriuretic peptide (BNP) 342.7 (*)    All other components within normal limits  CBC WITH DIFFERENTIAL - Abnormal; Notable for the following:    WBC 15.4 (*)    RBC 4.09 (*)    Hemoglobin 12.2 (*)    HCT 35.0 (*)    Platelets 402 (*)    Neutro Abs 11.5 (*)    Monocytes Absolute 1.4 (*)    All other components within normal limits  COMPREHENSIVE METABOLIC PANEL - Abnormal; Notable  for the following:    Glucose, Bld 201 (*)    Albumin 2.3 (*)    GFR calc non Af Amer 89 (*)    All other components within normal limits  URINALYSIS, ROUTINE W REFLEX MICROSCOPIC - Abnormal; Notable for the following:    APPearance CLOUDY (*)    Glucose, UA 250 (*)    Hgb urine dipstick SMALL (*)     Protein, ur 30 (*)    All other components within normal limits  URINE MICROSCOPIC-ADD ON - Abnormal; Notable for the following:    Bacteria, UA FEW (*)    All other components within normal limits  GLUCOSE, CAPILLARY - Abnormal; Notable for the following:    Glucose-Capillary 168 (*)    All other components within normal limits  GLUCOSE, CAPILLARY - Abnormal; Notable for the following:    Glucose-Capillary 180 (*)    All other components within normal limits  GLUCOSE, CAPILLARY - Abnormal; Notable for the following:    Glucose-Capillary 164 (*)    All other components within normal limits  GLUCOSE, CAPILLARY - Abnormal; Notable for the following:    Glucose-Capillary 134 (*)    All other components within normal limits  GLUCOSE, CAPILLARY - Abnormal; Notable for the following:    Glucose-Capillary 121 (*)    All other components within normal limits  CBC - Abnormal; Notable for the following:    WBC 18.3 (*)    RBC 3.73 (*)    Hemoglobin 11.0 (*)    HCT 31.8 (*)    All other components within normal limits  GLUCOSE, CAPILLARY - Abnormal; Notable for the following:    Glucose-Capillary 136 (*)    All other components within normal limits  GLUCOSE, CAPILLARY - Abnormal; Notable for the following:    Glucose-Capillary 115 (*)    All other components within normal limits  GLUCOSE, CAPILLARY - Abnormal; Notable for the following:    Glucose-Capillary 134 (*)    All other components within normal limits  GLUCOSE, CAPILLARY - Abnormal; Notable for the following:    Glucose-Capillary 155 (*)    All other components within normal limits  GLUCOSE, CAPILLARY - Abnormal; Notable for the following:    Glucose-Capillary 177 (*)    All other components within normal limits  GLUCOSE, CAPILLARY - Abnormal; Notable for the following:    Glucose-Capillary 147 (*)    All other components within normal limits  GLUCOSE, CAPILLARY - Abnormal; Notable for the following:    Glucose-Capillary 154  (*)    All other components within normal limits  POCT I-STAT 3, BLOOD GAS (G3+) - Abnormal; Notable for the following:    pO2, Arterial 469.0 (*)    All other components within normal limits  POCT I-STAT 3, BLOOD GAS (G3+) - Abnormal; Notable for the following:    pH, Arterial 7.233 (*)    pCO2 arterial 53.3 (*)    pO2, Arterial 367.0 (*)    Acid-base deficit 5.0 (*)    All other components within normal limits  CULTURE, BLOOD (ROUTINE X 2)  CULTURE, BLOOD (ROUTINE X 2)  URINE CULTURE  MRSA PCR SCREENING  CULTURE, BLOOD (ROUTINE X 2)  CULTURE, BLOOD (ROUTINE X 2)  CULTURE, RESPIRATORY (NON-EXPECTORATED)  URINE CULTURE  CULTURE, RESPIRATORY (NON-EXPECTORATED)  URINE CULTURE  PROTIME-INR  HEPARIN LEVEL (UNFRACTIONATED)  HEPARIN LEVEL (UNFRACTIONATED)  HEPARIN LEVEL (UNFRACTIONATED)  BASIC METABOLIC PANEL  HEPARIN LEVEL (UNFRACTIONATED)  CBC  HEPARIN LEVEL (UNFRACTIONATED)  TRIGLYCERIDES  BASIC METABOLIC PANEL  CG4 I-STAT (LACTIC ACID)   Imaging Review Ct Angio Chest Pe W/cm &/or Wo Cm  12/19/2012   CLINICAL DATA:  Evaluate for pulmonary embolism. The kidney a in shadow lobe breathing. Recent pneumonia.  EXAM: CT ANGIOGRAPHY CHEST WITH CONTRAST  TECHNIQUE: Multidetector CT imaging of the chest was performed using the standard protocol during bolus administration of intravenous contrast. Multiplanar CT image reconstructions including MIPs were obtained to evaluate the vascular anatomy.  CONTRAST:  80mL OMNIPAQUE IOHEXOL 350 MG/ML SOLN  COMPARISON:  Chest x-ray from yesterday  FINDINGS: THORACIC INLET/BODY WALL:  Endotracheal tube ends in the mid thoracic trachea.  MEDIASTINUM:  Positive for multi focal bilateral acute pulmonary embolism. One the most proximal clots is to the right lower lobe, essentially occlusive and involving the lateral and posterior basilar segments. There are additional right lower lobe and left upper lobe clots which are segmental sized. Suspect straightening  of the interventricular septum. Mild hepatic venous reflux also noted. Normal heart size. No pericardial effusion. No mediastinal lymphadenopathy. Infundibula noted at the origins of the systemic pulmonary arteries bilaterally.  LUNG WINDOWS:  Patchy consolidative opacities in the bilateral lungs, most of which are not within the distribution of embolic disease. Some peripheral dense consolidative opacities in the left lower lobe may represent lung infarcts, although there are not clear arterial filling defects in this distribution. Trace right pleural effusion.  UPPER ABDOMEN:  There may be fatty liver, although contrast timing limits certainty.  OSSEOUS:  No acute fracture.  No suspicious lytic or blastic lesions.  Critical Value/emergent results were called by telephone at the time of interpretation on 12/19/2012 at 12:56 AM to Dr.KONSTANTIN ZUBELEVITSKIY , who verbally acknowledged these results.  Review of the MIP images confirms the above findings.  IMPRESSION: 1. Acute bilateral, multifocal pulmonary embolism. There is likely related pulmonary hypertension/ right heart strain. 2. Multi focal pneumonia.   Electronically Signed   By: Tiburcio Pea M.D.   On: 12/19/2012 00:59   Dg Chest Port 1 View  12/20/2012   CLINICAL DATA:  Central line placement  EXAM: PORTABLE CHEST - 1 VIEW  COMPARISON:  12/20/2012  FINDINGS: Low lung volumes. Right-sided internal jugular catheter with tip projecting in the regions superior vena caval right atrial junction. Endotracheal tube with tip at a level clavicles. NG tube with tip not on the view of this study. There is prominence of the interstitial markings, indistinctness of the pulmonary vasculature, and peribronchial cuffing. Areas of increased density projects within the lung bases. Cardiac silhouette slightly upper limits of normal. Visualized osseous structures unremarkable. There is no evidence of pneumothorax.  IMPRESSION: 1. Support lines and tubes as described  above patient is status post internal jugular catheter placement on the right without evidence of pneumothorax 2. No significant change in the pulmonary infiltrates.   Electronically Signed   By: Salome Holmes M.D.   On: 12/20/2012 15:38   Dg Chest Port 1 View  12/20/2012   CLINICAL DATA:  Pneumonia.  EXAM: PORTABLE CHEST - 1 VIEW  COMPARISON:  Chest CT 12/19/2012.  Chest x-ray 12/18/2012.  FINDINGS: Endotracheal tube in good anatomic position 4 cm above the carina. NG tube in good anatomic position. Patchy bilateral pulmonary infiltrates remain. Heart size and pulmonary vascularity normal. No pleural effusion or pneumothorax. No acute osseous abnormality.  IMPRESSION: 1. Interim placement of NG tube, its tip is below the left hemidiaphragm. Endotracheal tube in good anatomic position. 2. Persistent bilateral patchy pulmonary infiltrates, no significant change from prior  exam.   Electronically Signed   By: Maisie Fus  Register   On: 12/20/2012 07:02   Dg Chest Portable 1 View  12/18/2012   CLINICAL DATA:  Evaluate endotracheal tube placement.  EXAM: PORTABLE CHEST - 1 VIEW  COMPARISON:  Chest radiograph December 18, 2012 at 2151 hr  FINDINGS: Endotracheal tube tip projects 3 cm above the carina. Multiple EKG lines overlie the patient and may obscure subtle underlying pathology.Cardiomediastinal silhouette are normal for this low inspiratory portable examination crowded vasculature markings. Increasing right upper lobe, similar right perihilar airspace opacities, mild left lower lobe airspace opacity. No definite pleural effusions. No pneumothorax. Mildly gas distended stomach.  IMPRESSION: Endotracheal tube tip projects 3 cm above the equina.  Increasing alveolar airspace opacities concerning for pneumonia. Recommend followup chest radiograph after treatment to verify improvement.   Electronically Signed   By: Awilda Metro   On: 12/18/2012 23:26   Dg Chest Port 1 View  12/18/2012   CLINICAL DATA:   Shortness of breath, labored bleeding, diagnosed with pneumonia 2 weeks ago  EXAM: PORTABLE CHEST - 1 VIEW  COMPARISON:  12/16/2012; 01/12/2013; 12/11/2012  FINDINGS: Grossly unchanged borderline enlarged cardiac silhouette and mediastinal contours given persistently reduced lung volumes. The pulmonary vasculature remains indistinct with cephalization of flow. Worsening bibasilar heterogeneous airspace opacities. No definite pleural effusion or pneumothorax. Unchanged bones. Next the  IMPRESSION: Overall findings worrisome for recurrent multifocal infection and note, underlying pulmonary edema is not excluded. A follow-up chest radiograph in 4 to 6 weeks after treatment is recommended to ensure resolution.   Electronically Signed   By: Simonne Come M.D.   On: 12/18/2012 22:05    EKG Interpretation    Date/Time:  Wednesday December 18 2012 21:46:47 EST Ventricular Rate:  110 PR Interval:  155 QRS Duration: 63 QT Interval:  336 QTC Calculation: 454 R Axis:   -27 Text Interpretation:  Sinus tachycardia Inferoposterior infarct, recent ED PHYSICIAN INTERPRETATION AVAILABLE IN [facility] Confirmed by TEST, RECORD ([id number]) on 12/20/2012 9:14:03 AM            MDM   1. Acute respiratory failure   2. Abnormal CT of brain   3. ARDS (adult respiratory distress syndrome)   4. Convulsions/seizures   5. DM (diabetes mellitus), type 2 with renal complications   6. HTN (hypertension), malignant   7. Pulmonary embolism   8. Pulmonary emboli   9. Fever    57 y.o. male with respiratory distress. Temp >102. Recently d/c'd for PNA. Code sepsis initiated. Ordered vanc/zosyn to treat HCAP after blood cx drawn. CBC with leukocytosis. Lactic acid <2. ABG without significant acidosis. CXR with signs of pneumonia. Bipap initiated without any improvement in breathing so after discussion with family patient intubated as above. Critical care consulted. Pt to CT and it revealed PE. Pt stable on re-eval. Care  per critical care, pt admitted.      I independently viewed, interpreted, and used in my medical decision making all ordered lab and imaging tests. Medical Decision Making discussed with ED attending Dr. Manus Gunning.    Charm Barges, MD 12/20/12 [phone]

## 2012-12-18 NOTE — ED Notes (Signed)
24 at the lip, 7.5 tube, bilateral breath sounds heard, intubation seen through cords, positive color change

## 2012-12-18 NOTE — ED Notes (Addendum)
EDP and RES. Pt is aware of poc. Remains on monitor and BiPAP. Pt is tolerating BiPap but RR is still above normal limits. POC is to still intubate patient.

## 2012-12-18 NOTE — ED Notes (Signed)
Critical Care at bedside.  

## 2012-12-18 NOTE — ED Notes (Signed)
Pt arrived from home via [facility]  Tachypnea, shallow, and diminished lower bases. Released earlier today for PNA. EMS VS BP 168/112, O2 96% on 4 L/min by [place], RR 30-40, CBG 329.

## 2012-12-18 NOTE — Progress Notes (Signed)
Assessment unchanged. RN went over D/C instructions and gave pt prescriptions. Pt  verbalized understanding. IV and Tele D/C. Pt left accompanied by volunteer and family.   Genevive Bi, RN

## 2012-12-19 ENCOUNTER — Emergency Department (HOSPITAL_COMMUNITY): Payer: BC Managed Care – PPO

## 2012-12-19 ENCOUNTER — Encounter (HOSPITAL_COMMUNITY): Payer: Self-pay | Admitting: Radiology

## 2012-12-19 DIAGNOSIS — I2699 Other pulmonary embolism without acute cor pulmonale: Secondary | ICD-10-CM

## 2012-12-19 LAB — GLUCOSE, CAPILLARY
Glucose-Capillary: 180 mg/dL — ABNORMAL HIGH (ref 70–99)
Glucose-Capillary: 164 mg/dL — ABNORMAL HIGH (ref 70–99)
Glucose-Capillary: 134 mg/dL — ABNORMAL HIGH (ref 70–99)
Glucose-Capillary: 136 mg/dL — ABNORMAL HIGH (ref 70–99)

## 2012-12-19 LAB — HEPARIN LEVEL (UNFRACTIONATED): Heparin Unfractionated: 0.41 IU/mL (ref 0.30–0.70)

## 2012-12-19 LAB — MRSA PCR SCREENING: MRSA by PCR: NEGATIVE

## 2012-12-19 MED ORDER — ASPIRIN EC 81 MG PO TBEC
81.0000 mg | DELAYED_RELEASE_TABLET | Freq: Every day | ORAL | Status: DC
  Administered 2012-12-19: 81 mg via ORAL
  Filled 2012-12-19 (×2): qty 1

## 2012-12-19 MED ORDER — LABETALOL HCL 200 MG PO TABS
200.0000 mg | ORAL_TABLET | Freq: Two times a day (BID) | ORAL | Status: DC
  Administered 2012-12-19 – 2012-12-20 (×2): 200 mg via ORAL
  Filled 2012-12-19 (×3): qty 1

## 2012-12-19 MED ORDER — BIOTENE DRY MOUTH MT LIQD
1.0000 "application " | Freq: Four times a day (QID) | OROMUCOSAL | Status: DC
  Administered 2012-12-19 – 2012-12-23 (×17): 15 mL via OROMUCOSAL

## 2012-12-19 MED ORDER — IOHEXOL 350 MG/ML SOLN
80.0000 mL | Freq: Once | INTRAVENOUS | Status: AC | PRN
  Administered 2012-12-19: 80 mL via INTRAVENOUS

## 2012-12-19 MED ORDER — LEVOFLOXACIN IN D5W 750 MG/150ML IV SOLN
750.0000 mg | INTRAVENOUS | Status: DC
  Administered 2012-12-19 – 2012-12-20 (×2): 750 mg via INTRAVENOUS
  Filled 2012-12-19 (×2): qty 150

## 2012-12-19 MED ORDER — FENTANYL CITRATE 0.05 MG/ML IJ SOLN
50.0000 ug | Freq: Once | INTRAMUSCULAR | Status: AC
  Administered 2012-12-19: 50 ug via INTRAVENOUS

## 2012-12-19 MED ORDER — ATORVASTATIN CALCIUM 20 MG PO TABS
20.0000 mg | ORAL_TABLET | Freq: Every day | ORAL | Status: DC
  Administered 2012-12-19 – 2012-12-31 (×12): 20 mg via ORAL
  Filled 2012-12-19 (×14): qty 1

## 2012-12-19 MED ORDER — FENTANYL CITRATE 0.05 MG/ML IJ SOLN
INTRAMUSCULAR | Status: AC
  Administered 2012-12-19: 50 ug via INTRAVENOUS
  Filled 2012-12-19: qty 2

## 2012-12-19 MED ORDER — FENTANYL BOLUS VIA INFUSION
50.0000 ug | INTRAVENOUS | Status: DC | PRN
  Filled 2012-12-19: qty 100

## 2012-12-19 MED ORDER — SODIUM CHLORIDE 0.9 % IV SOLN
1000.0000 mL | INTRAVENOUS | Status: DC
  Administered 2012-12-19: 20 mL/h via INTRAVENOUS

## 2012-12-19 MED ORDER — CHLORHEXIDINE GLUCONATE 0.12 % MT SOLN
15.0000 mL | Freq: Two times a day (BID) | OROMUCOSAL | Status: DC
  Administered 2012-12-19 (×3): 15 mL via OROMUCOSAL
  Filled 2012-12-19 (×3): qty 15

## 2012-12-19 MED ORDER — ACETAMINOPHEN 160 MG/5ML PO SOLN
650.0000 mg | Freq: Four times a day (QID) | ORAL | Status: DC | PRN
  Administered 2012-12-19 – 2012-12-21 (×5): 650 mg via ORAL
  Filled 2012-12-19 (×6): qty 20.3

## 2012-12-19 MED ORDER — INSULIN ASPART 100 UNIT/ML ~~LOC~~ SOLN
0.0000 [IU] | SUBCUTANEOUS | Status: DC
  Administered 2012-12-19: 2 [IU] via SUBCUTANEOUS
  Administered 2012-12-19 (×2): 3 [IU] via SUBCUTANEOUS
  Administered 2012-12-19 (×2): 2 [IU] via SUBCUTANEOUS
  Administered 2012-12-20: 3 [IU] via SUBCUTANEOUS
  Administered 2012-12-20: 2 [IU] via SUBCUTANEOUS
  Administered 2012-12-20 (×3): 3 [IU] via SUBCUTANEOUS
  Administered 2012-12-21 (×2): 5 [IU] via SUBCUTANEOUS
  Filled 2012-12-19: qty 1

## 2012-12-19 MED ORDER — SODIUM CHLORIDE 0.9 % IV SOLN
0.0000 ug/h | INTRAVENOUS | Status: DC
  Administered 2012-12-19: 100 ug/h via INTRAVENOUS
  Administered 2012-12-20: 150 ug/h via INTRAVENOUS
  Administered 2012-12-20: 200 ug/h via INTRAVENOUS
  Administered 2012-12-22: 100 ug/h via INTRAVENOUS
  Filled 2012-12-19 (×5): qty 50

## 2012-12-19 MED ORDER — PANTOPRAZOLE SODIUM 40 MG IV SOLR
40.0000 mg | INTRAVENOUS | Status: DC
  Administered 2012-12-19 – 2012-12-20 (×2): 40 mg via INTRAVENOUS
  Filled 2012-12-19 (×3): qty 40

## 2012-12-19 MED ORDER — LABETALOL HCL 200 MG PO TABS
200.0000 mg | ORAL_TABLET | Freq: Two times a day (BID) | ORAL | Status: DC
  Administered 2012-12-19: 200 mg via ORAL
  Filled 2012-12-19 (×3): qty 1

## 2012-12-19 MED ORDER — HEPARIN (PORCINE) IN NACL 100-0.45 UNIT/ML-% IJ SOLN
2100.0000 [IU]/h | INTRAMUSCULAR | Status: DC
  Administered 2012-12-19: 1500 [IU]/h via INTRAVENOUS
  Administered 2012-12-19 – 2012-12-20 (×2): 1600 [IU]/h via INTRAVENOUS
  Administered 2012-12-21: 2100 [IU]/h via INTRAVENOUS
  Administered 2012-12-21: 1800 [IU]/h via INTRAVENOUS
  Filled 2012-12-19 (×7): qty 250

## 2012-12-19 MED ORDER — HEPARIN BOLUS VIA INFUSION
5000.0000 [IU] | Freq: Once | INTRAVENOUS | Status: AC
  Administered 2012-12-19: 5000 [IU] via INTRAVENOUS
  Filled 2012-12-19: qty 5000

## 2012-12-19 NOTE — Progress Notes (Signed)
ANTICOAGULATION CONSULT NOTE - Initial Consult  Pharmacy Consult for Heparin  Indication: pulmonary embolus  No Known Allergies  Patient Measurements: Height: 5' 8.9" (175 cm) Weight: 200 lb 9.9 oz (91 kg) IBW/kg (Calculated) : 70.47 Heparin Dosing Weight: 90 kg  Vital Signs: Temp: 102.2 F (39 C) (11/20 0000) Temp src: Oral (11/19 2150) BP: 144/85 mmHg (11/20 0015) Pulse Rate: 110 (11/20 0015)  Labs:  Recent Labs  12/17/12 0505 12/18/12 0541 12/18/12 2204 12/18/12 2210  HGB 11.0* 11.3* 12.2*  --   HCT 32.6* 33.4* 35.0*  --   PLT 333 356 402*  --   LABPROT  --   --   --  12.3  INR  --   --   --  0.93  CREATININE 1.10 1.20 0.98  --     Estimated Creatinine Clearance: 91.5 ml/min (by C-G formula based on Cr of 0.98).   Medical History: Past Medical History  Diagnosis Date  . Diabetes mellitus without complication 2009  . Renal disorder   . Hyperlipidemia   . Difficult intubation     Medications:  ASA  Amaryl  Labetalol  Levaquin  Jentadueto  Claritin  MVI  Crestor  Assessment: 57 yo male with PE for heparin Goal of Therapy:  Heparin level 0.3-0.7 units/ml Monitor platelets by anticoagulation protocol: Yes   Plan:  Heparin 5000 units IV bolus, then 1500 units/hr Check heparin level in 6 hours.  Eddie Candle 12/19/2012,1:04 AM

## 2012-12-19 NOTE — Progress Notes (Signed)
ANTICOAGULATION CONSULT NOTE - Follow Up Consult  Pharmacy Consult for heparin Indication: pulmonary embolus, and bilateral lower extremity DVTs  No Known Allergies  Patient Measurements: Height: 5\' 8"  (172.7 cm) Weight: 207 lb 3.7 oz (94 kg) IBW/kg (Calculated) : 68.4 Heparin Dosing Weight: 90 kg  Vital Signs: Temp: 100.7 F (38.2 C) (11/20 1900) Temp src: Core (Comment) (11/20 1900) BP: 108/66 mmHg (11/20 2023) Pulse Rate: 102 (11/20 2023)  Labs:  Recent Labs  12/17/12 0505 12/18/12 0541 12/18/12 2204 12/18/12 2210 12/19/12 0954 12/19/12 1940  HGB 11.0* 11.3* 12.2*  --   --   --   HCT 32.6* 33.4* 35.0*  --   --   --   PLT 333 356 402*  --   --   --   LABPROT  --   --   --  12.3  --   --   INR  --   --   --  0.93  --   --   HEPARINUNFRC  --   --   --   --  0.32 0.41  CREATININE 1.10 1.20 0.98  --   --   --     Estimated Creatinine Clearance: 91.3 ml/min (by C-G formula based on Cr of 0.98).   Medications:  Scheduled:  . antiseptic oral rinse  1 application Mouth Rinse QID  . aspirin EC  81 mg Oral Daily  . atorvastatin  20 mg Oral q1800  . chlorhexidine  15 mL Mouth/Throat BID  . insulin aspart  0-15 Units Subcutaneous Q4H  . labetalol  200 mg Oral BID  . levofloxacin (LEVAQUIN) IV  750 mg Intravenous Q24H  . pantoprazole (PROTONIX) IV  40 mg Intravenous Q24H   Infusions:  . sodium chloride 20 mL/hr (12/19/12 1335)  . fentaNYL infusion INTRAVENOUS 100 mcg/hr (12/19/12 0928)  . heparin 1,600 Units/hr (12/19/12 1147)  . propofol 40 mcg/kg/min (12/19/12 1915)    Assessment: 57 yom with new bilateral LE DVTs on dopplers. Confirmatory HL drawn after 8 hours remained therapeutic at 0.41. Hemoglobin and plt remain stable. Per RN, no bleeding noted and heparin line running well.  Goal of Therapy:  Heparin level 0.3-0.7 units/ml Monitor platelets by anticoagulation protocol: Yes   Plan:  - Continue heparin infusion at 1600 units/hr - daily hep lvl and  CBC - monitor for s/s of bleeding  Vinnie Level, PharmD.  Clinical Pharmacist Pager [phone]

## 2012-12-19 NOTE — ED Notes (Signed)
Pt taken to CT scan.

## 2012-12-19 NOTE — Progress Notes (Signed)
ANTICOAGULATION CONSULT NOTE - Follow Up Consult  Pharmacy Consult for heparin Indication: pulmonary embolus, and bilateral lower extremity DVTs  No Known Allergies  Patient Measurements: Height: 5\' 8"  (172.7 cm) Weight: 207 lb 3.7 oz (94 kg) IBW/kg (Calculated) : 68.4 Heparin Dosing Weight: 90 kg  Vital Signs: Temp: 100.7 F (38.2 C) (11/20 0900) Temp src: Core (Comment) (11/20 0900) BP: 118/62 mmHg (11/20 0900) Pulse Rate: 92 (11/20 0900)  Labs:  Recent Labs  12/17/12 0505 12/18/12 0541 12/18/12 2204 12/18/12 2210 12/19/12 0954  HGB 11.0* 11.3* 12.2*  --   --   HCT 32.6* 33.4* 35.0*  --   --   PLT 333 356 402*  --   --   LABPROT  --   --   --  12.3  --   INR  --   --   --  0.93  --   HEPARINUNFRC  --   --   --   --  0.32  CREATININE 1.10 1.20 0.98  --   --     Estimated Creatinine Clearance: 91.3 ml/min (by C-G formula based on Cr of 0.98).   Medications:  Scheduled:  . antiseptic oral rinse  1 application Mouth Rinse QID  . aspirin EC  81 mg Oral Daily  . atorvastatin  20 mg Oral q1800  . chlorhexidine  15 mL Mouth/Throat BID  . insulin aspart  0-15 Units Subcutaneous Q4H  . labetalol  200 mg Oral BID  . levofloxacin (LEVAQUIN) IV  750 mg Intravenous Q24H  . pantoprazole (PROTONIX) IV  40 mg Intravenous Q24H   Infusions:  . sodium chloride 1,000 mL (12/19/12 0200)  . fentaNYL infusion INTRAVENOUS 100 mcg/hr (12/19/12 0928)  . heparin 1,500 Units/hr (12/19/12 0230)  . propofol 40 mcg/kg/min (12/19/12 1044)    Assessment: 57 yo discharged 11/19 after being treated for Enterobacter aerogenes pneumonia complicated by VDRF, returned to [facility] ED same day in respiratory distress. Patient was intubated and CTA chest revealed PE.  This morning lower extremity dopplers also revealed bilateral LE DVTs.  He initially received a bolus of 5000 units followed by a rate of 1500 units/hr last night.  The follow up heparin level has resulted at 0.32, on lower end of  therapeutic.  Although within range, would prefer slightly higher level within therapeutic window d/t clinical picture and to avoid subtherapeutic if level trends downward.  Goal of Therapy:  Heparin level 0.3-0.7 units/ml Monitor platelets by anticoagulation protocol: Yes   Plan:  - will increase to 1600 units/hr - draw 6 hours heparin level - daily hep lvl and CBC - monitor for s/s of bleeding  Harrold Donath E. Achilles Dunk, PharmD Clinical Pharmacist - Resident Pager: [phone] Pharmacy: [phone] 12/19/2012 11:37 AM

## 2012-12-19 NOTE — Care Management Note (Addendum)
    Page 1 of 2   01/02/2013     2:57:05 PM   CARE MANAGEMENT NOTE 01/02/2013  Patient:  Juan Glenn, Juan Glenn   Account Number:  [id number]  Date Initiated:  12/19/2012  Documentation initiated by:  Avie Arenas  Subjective/Objective Assessment:   Readmission with [facility] - intubated found to have Bilateral PE's and DVT's.     Action/Plan:   Anticipated DC Date:  01/01/2013   Anticipated DC Plan:  SKILLED NURSING FACILITY  In-house referral  Clinical Social Worker      DC Associate Professor  CM consult      [facility] Choice  HOME HEALTH   Choice offered to / List presented to:  C-1 Patient        HH arranged  HH-1 RN  HH-2 PT  HH-3 OT      [facility] agency  [facility]   Status of service:  Completed, signed off Medicare Important Message given?   (If response is "NO", the following Medicare IM given date fields will be blank) Date Medicare IM given:   Date Additional Medicare IM given:    Discharge Disposition:  HOME W HOME HEALTH SERVICES  Per UR Regulation:  Reviewed for med. necessity/level of care/duration of stay  If discussed at Long Length of Stay Meetings, dates discussed:   12/31/2012    Comments:  ContactAshby, [place] Spouse [phone]  01/01/13 11:30 Letha Cape RN, BSN [phone] patient is for dc to snf today, CSW following, ([facility]).  CSW did not receive auth from insurance, patient is for dc to home with [facility] with [facility] for HHRN,PT, OT, referral  made to [facility]  Soc will begin 24-48 hrs post discharge.  12-19-12 - 1:40pm Avie Arenas, RNBSN [phone] Discharged on 11-19 post pnuemonia with [facility] PT/OT and RN with [facility].  Readmitted  now with PE's and intubateds.   Wife in room.  Updated - confirmed was at home with [facility] but visits had not started.  Alerted [facility] of patients readmission.

## 2012-12-19 NOTE — Progress Notes (Signed)
VASCULAR LAB PRELIMINARY  PRELIMINARY  PRELIMINARY  PRELIMINARY  Bilateral lower extremity venous Dopplers completed.    Preliminary report:  There is acute, occlusive DVT noted in the bilateral posterior tibial and peroneal veins.  Cannot rule out acute, non occlusive DVT of the right femoral vein and left popliteal vein.  Sky Primo, RVT 12/19/2012, 10:19 AM

## 2012-12-19 NOTE — Progress Notes (Signed)
INITIAL NUTRITION ASSESSMENT  DOCUMENTATION CODES Per approved criteria  -Obesity Unspecified   INTERVENTION:  If unable to extubate patient within the next 24 hours, recommend initiate TF via OGT with Vital HP at 20 ml/h, initial goal rate, with Prostat 60 ml TID to provide 1080 kcals, 132 gm protein, 401 ml free water daily.  Above TF regimen plus current Propofol will provide a total of 1656 kcals (24 kcals/kg ideal weight), 132 gm protein (94% of estimated needs), and 401 ml free water daily.   When able to discontinue Propofol, goal for TF is Vital HP at 70 ml/h to provide 1680 kcals, 147 gm protein, 1404 ml free water daily.   NUTRITION DIAGNOSIS: Inadequate oral intake related to inability to eat as evidenced by NPO status.   Goal: Enteral nutrition to provide 60-70% of estimated calorie needs (22-25 kcals/kg ideal body weight) and 100% of estimated protein needs, based on ASPEN guidelines for permissive underfeeding in critically ill obese individuals.  Monitor:  TF tolerance/adequacy, weight trend, labs, vent status.  Reason for Assessment: VDRF  57 y.o. male  Admitting Dx: Acute respiratory failure  ASSESSMENT: Patient was discharged on 11/19 after being treated for Enterobacter aerogenes pneumonia complicated by VDRF; returned to [facility] ED same day in respiratory distress. Intubated. CTA chest revealed large PE.   Found to have acute bilateral LE DVT. For echocardiogram today to evaluate for right heart strain.  Patient is currently intubated on ventilator support.  MV: 17.5 L/min Temp:Temp (24hrs), Avg:100.7 F (38.2 C), Min:98.6 F (37 C), Max:102.6 F (39.2 C)  Propofol: 21.8 ml/hr providing 576 kcals/day.  Height: Ht Readings from Last 1 Encounters:  12/19/12 5\' 8"  (1.727 m)    Weight: Wt Readings from Last 1 Encounters:  12/19/12 207 lb 3.7 oz (94 kg)    Ideal Body Weight: 70 kg  % Ideal Body Weight: 134%  Wt Readings from Last 10 Encounters:   12/19/12 207 lb 3.7 oz (94 kg)  12/18/12 201 lb 12.8 oz (91.536 kg)  10/24/12 209 lb (94.802 kg)  10/03/12 208 lb (94.348 kg)  09/06/12 214 lb 9.6 oz (97.342 kg)    Usual Body Weight: 209 lb  % Usual Body Weight: 99%  BMI:  Body mass index is 31.52 kg/(m^2).  Estimated Nutritional Needs: Kcal: 2550 Protein: 140 gm Fluid: 2.5 L  Skin: dry skin over area of abraised site on side of left pinky finger  Diet Order: NPO  EDUCATION NEEDS: -Education not appropriate at this time   Intake/Output Summary (Last 24 hours) at 12/19/12 1407 Last data filed at 12/19/12 1000  Gross per 24 hour  Intake 3867.31 ml  Output   1150 ml  Net 2717.31 ml    Last BM: none documented since this admission    Labs:   Recent Labs Lab 12/17/12 0505 12/18/12 0541 12/18/12 2204  NA 149* 146* 140  K 3.5 3.6 3.9  CL 115* 113* 108  CO2 23 21 20   BUN 21 16 15   CREATININE 1.10 1.20 0.98  CALCIUM 8.2* 8.3* 8.5  MG  --  2.2  --   PHOS  --  3.2  --   GLUCOSE 203* 181* 201*    CBG (last 3)   Recent Labs  12/19/12 0341 12/19/12 0752 12/19/12 1117  GLUCAP 164* 134* 121*    Scheduled Meds: . antiseptic oral rinse  1 application Mouth Rinse QID  . aspirin EC  81 mg Oral Daily  . atorvastatin  20 mg Oral  q1800  . chlorhexidine  15 mL Mouth/Throat BID  . insulin aspart  0-15 Units Subcutaneous Q4H  . labetalol  200 mg Oral BID  . levofloxacin (LEVAQUIN) IV  750 mg Intravenous Q24H  . pantoprazole (PROTONIX) IV  40 mg Intravenous Q24H    Continuous Infusions: . sodium chloride 20 mL/hr (12/19/12 1335)  . fentaNYL infusion INTRAVENOUS 100 mcg/hr (12/19/12 0928)  . heparin 1,600 Units/hr (12/19/12 1147)  . propofol 40 mcg/kg/min (12/19/12 1044)    Past Medical History  Diagnosis Date  . Diabetes mellitus without complication 2009  . Renal disorder   . Hyperlipidemia   . Difficult intubation     Past Surgical History  Procedure Laterality Date  . No surgical history   09/2012    Joaquin Courts, RD, LDN, CNSC Pager [phone] After Hours Pager [phone]

## 2012-12-19 NOTE — H&P (Signed)
PULMONARY  / CRITICAL CARE MEDICINE  Name: Juan Glenn MRN: [id number] DOB: [birth year]    ADMISSION DATE:  12/18/2012 CONSULTATION DATE:  12/18/2012  REFERRING MD :  EDP PRIMARY SERVICE:  PCCM  CHIEF COMPLAINT:  Acute respiratory failure  BRIEF PATIENT DESCRIPTION: 57 yo discharged 11/19 after being treated for Enterobacter aerogenes pneumonia complicated by VDRF, returned to [facility] ED same day in respiratory distress.  Intubated. CTA chest revealed large PE.  SIGNIFICANT EVENTS / STUDIES:  11/19  Respiratory distress. Intubated. 11/19  CTA chest >>> Bilateral PE, multifocal pneumonia  LINES / TUBES: OETT 11/19 >>> OGT 11/19 >>> Foley 11/19 >>>  CULTURES: 11/19 Blood >>> 11/19 Urine >>>  ANTIBIOTICS: Cefepime 11/19 x 1 Vancomycin 11/19 x 1 Levaquin 11/19 >>>  The patient is encephalopathic and unable to provide history, which was obtained for available medical records.  HISTORY OF PRESENT ILLNESS:  57 yo discharged 11/19 after being treated for Enterobacter aerogenes pneumonia complicated by VDRF, returned to [facility] ED same day in respiratory distress.  Intubated. CTA chest revealed large PE.  PAST MEDICAL HISTORY :  Past Medical History  Diagnosis Date  . Diabetes mellitus without complication 2009  . Renal disorder   . Hyperlipidemia   . Difficult intubation    Past Surgical History  Procedure Laterality Date  . No surgical history  09/2012   Prior to Admission medications   Medication Sig Start Date End Date Taking? Authorizing Provider  aspirin EC 81 MG tablet Take 1 tablet (81 mg total) by mouth daily. 09/07/12  Yes Catarina Hartshorn, MD  glimepiride (AMARYL) 2 MG tablet Take 2 mg by mouth 2 (two) times daily before a meal. Take 1 tablet (2 mg total) by mouth 2 (two) times daily before meals. 07/15/12  Yes Historical Provider, MD  labetalol (NORMODYNE) 200 MG tablet Take 1 tablet (200 mg total) by mouth 2 (two) times daily. 12/18/12  Yes Jeanella Craze, NP   levofloxacin (LEVAQUIN) 750 MG tablet Take 1 tablet (750 mg total) by mouth daily. 12/18/12  Yes Jeanella Craze, NP  Linagliptin-Metformin HCl (JENTADUETO) 2.05-998 MG TABS 1 tablet. Take 1 tablet by mouth 2 (two) times daily. 07/15/12  Yes Historical Provider, MD  Loratadine (CLARITIN PO) Take 1 tablet by mouth daily as needed (allergies).   Yes Historical Provider, MD  Multiple Vitamin (MULTIVITAMIN WITH MINERALS) TABS tablet Take 1 tablet by mouth daily.   Yes Historical Provider, MD  rosuvastatin (CRESTOR) 10 MG tablet Take 10 mg by mouth daily.   Yes Historical Provider, MD   No Known Allergies  FAMILY HISTORY:  Family History  Problem Relation Age of Onset  . Diabetes Mother   . Congestive Heart Failure Mother     Died, 67s  . Heart attack Father     Died, 34  . Diabetes Brother   . Hypertension Sister   . Cancer Brother     Died, 66   SOCIAL HISTORY:  reports that he has never smoked. He has never used smokeless tobacco. He reports that he does not drink alcohol or use illicit drugs.  REVIEW OF SYSTEMS:  Unable to provide.  INTERVAL HISTORY:  VITAL SIGNS: Temp:  [99.2 F (37.3 C)-102.6 F (39.2 C)] 102.2 F (39 C) (11/20 0000) Pulse Rate:  [94-115] 110 (11/20 0015) Resp:  [14-50] 34 (11/19 2346) BP: (98-209)/(57-103) 144/85 mmHg (11/20 0015) SpO2:  [93 %-100 %] 94 % (11/20 0015) FiO2 (%):  [100 %] 100 % (11/19 2346) Weight:  [91  kg (200 lb 9.9 oz)-91.536 kg (201 lb 12.8 oz)] 91 kg (200 lb 9.9 oz) (11/19 2245)  HEMODYNAMICS:   VENTILATOR SETTINGS: Vent Mode:  [-]  FiO2 (%):  [100 %] 100 %  INTAKE / OUTPUT: Intake/Output     11/19 0701 - 11/20 0700   I.V. (mL/kg) 2000 (22)   Total Intake(mL/kg) 2000 (22)   Urine (mL/kg/hr) 250   Total Output 250   Net +1750        PHYSICAL EXAMINATION: General:  Appears acutely ill, mechanically ventilated, synchronous Neuro:  Encephalopathic, nonfocal, cough / gag diminished HEENT:  PERRL, OETT Cardiovascular:   Tachycardic, regular Lungs:  Bilateral diminished air entry, no w/r/r Abdomen:  Soft, nontender, bowel sounds diminished Musculoskeletal:  Moves all extremities, no edema Skin:  Intact  LABS:  CBC  Recent Labs Lab 12/17/12 0505 12/18/12 0541 12/18/12 2204  WBC 11.9* 10.0 15.4*  HGB 11.0* 11.3* 12.2*  HCT 32.6* 33.4* 35.0*  PLT 333 356 402*   Coag's  Recent Labs Lab 12/18/12 2210  INR 0.93   BMET  Recent Labs Lab 12/17/12 0505 12/18/12 0541 12/18/12 2204  NA 149* 146* 140  K 3.5 3.6 3.9  CL 115* 113* 108  CO2 23 21 20   BUN 21 16 15   CREATININE 1.10 1.20 0.98  GLUCOSE 203* 181* 201*   Electrolytes  Recent Labs Lab 12/17/12 0505 12/18/12 0541 12/18/12 2204  CALCIUM 8.2* 8.3* 8.5  MG  --  2.2  --   PHOS  --  3.2  --    Sepsis Markers  Recent Labs Lab 12/18/12 2217  LATICACIDVEN 1.88   ABG  Recent Labs Lab 12/15/12 0830 12/18/12 2228 12/18/12 2346  PHART 7.423 7.390 7.233*  PCO2ART 43.3 38.8 53.3*  PO2ART 66.0* 469.0* 367.0*   Liver Enzymes  Recent Labs Lab 12/18/12 2204  AST 37  ALT 44  ALKPHOS 77  BILITOT 0.3  ALBUMIN 2.3*   Cardiac Enzymes  Recent Labs Lab 12/18/12 2151  PROBNP 342.7*   Glucose  Recent Labs Lab 12/17/12 0807 12/17/12 1235 12/17/12 1640 12/17/12 2123 12/18/12 0600 12/18/12 1112  GLUCAP 194* 254* 153* 150* 174* 218*   CXR:  11/19 >>> hardware in place, bilateral airspace disease  ASSESSMENT / PLAN:  PULMONARY A:  Acute respiratory failure in setting of bilateral pulmonary embolism.  Recent Enterobacter aerogenes pneumonia. P:   Gaol SpO2>92, pH>7.30 Full mechanical support, high Ve Daily SBT Ventilator bundle Trend ABG / CXR Anticoagulation as below TTE Venous Doppler BL LE  CARDIOVASCULAR A: Hypertension.  P:  Goal MAP>60 Preadmission Labetalol / lipitor, ASA  RENAL A:  No active issues. P:   Trend BMP NS@125   GASTROINTESTINAL A:  No issues. P:   NPO as intubated Protonix  for GI Px  HEMATOLOGIC A:  Acute pulmonary embolism. P:  Trend CBC / APTT Heparin per Pharmacy  INFECTIOUS A:  Recovering from aspiration pneumonia. P:   Cultures and antibiotics as above  ENDOCRINE  A:  DMII.  Hyperglycemia, P:   SSI Hold Amaryl, Jentadueto  NEUROLOGIC A:  Acute encephalopathy. P:   Goal RASS 0 to -1 Propofol gtt  I have personally obtained history, examined patient, evaluated and interpreted laboratory and imaging results, reviewed medical records, formulated assessment / plan and placed orders.  CRITICAL CARE:  The patient is critically ill with multiple organ systems failure and requires high complexity decision making for assessment and support, frequent evaluation and titration of therapies, application of advanced monitoring technologies and  extensive interpretation of multiple databases. Critical Care Time devoted to patient care services described in this note is 50 minutes.   Lonia Farber, MD Pulmonary and Critical Care Medicine [facility] Pager: [phone]  12/19/2012, 1:02 AM

## 2012-12-19 NOTE — Progress Notes (Signed)
PULMONARY  / CRITICAL CARE MEDICINE  Name: Juan Glenn MRN: [id number] DOB: [birth year]    ADMISSION DATE:  12/18/2012 CONSULTATION DATE:  12/18/2012  REFERRING MD :  EDP PRIMARY SERVICE:  PCCM  CHIEF COMPLAINT:  Acute respiratory failure  BRIEF PATIENT DESCRIPTION: 57 yo discharged 11/19 after being treated for Enterobacter aerogenes pneumonia complicated by VDRF, returned to [facility] ED same day in respiratory distress.  Intubated. CTA chest revealed large PE.  SIGNIFICANT EVENTS / STUDIES:  11/19  Respiratory distress. Intubated. 11/19  CTA chest >>> Bilateral PE, multifocal pneumonia 11/20 Echo today evaluate for Right Heart Strain  11/20 duplex > acute B LE DVT  LINES / TUBES: OETT 11/19 >>> OGT 11/19 >>> Foley 11/19 >>>  CULTURES: 11/19 Blood >>> 11/19 Urine >>>  ANTIBIOTICS: Cefepime 11/19 x 1 Vancomycin 11/19 x 1 Levaquin 11/19 >>>  INTERVAL HISTORY: Pt intubated with PE and Multifocal PNA  VITAL SIGNS: Temp:  [98.6 F (37 C)-102.6 F (39.2 C)] 100.2 F (37.9 C) (11/20 0600) Pulse Rate:  [91-115] 91 (11/20 0600) Resp:  [14-50] 27 (11/20 0600) BP: (90-209)/(56-119) 92/56 mmHg (11/20 0600) SpO2:  [94 %-100 %] 99 % (11/20 0600) FiO2 (%):  [30 %-100 %] 30 % (11/20 0737) Weight:  [200 lb 9.9 oz (91 kg)-207 lb 3.7 oz (94 kg)] 207 lb 3.7 oz (94 kg) (11/20 0230)  HEMODYNAMICS:   VENTILATOR SETTINGS: Vent Mode:  [-] PRVC FiO2 (%):  [30 %-100 %] 30 % Set Rate:  [30 bmp] 30 bmp Vt Set:  [450 mL-550 mL] 550 mL PEEP:  [5 cmH20] 5 cmH20 Plateau Pressure:  [15 cmH20-25 cmH20] 25 cmH20  INTAKE / OUTPUT: Intake/Output     11/19 0701 - 11/20 0700 11/20 0701 - 11/21 0700   I.V. (mL/kg) 3133.7 (33.3)    IV Piggyback 150    Total Intake(mL/kg) 3283.7 (34.9)    Urine (mL/kg/hr) 750    Total Output 750     Net +2533.7           PHYSICAL EXAMINATION: General:  Appears acutely ill, mechanically ventilated, synchronous Neuro:  Sedated, encephalopathic  HEENT:   OETT Cardiovascular:  Tachycardic, regular rhythm  Lungs:  Bilateral diminished air entry, no w/r/r Abdomen:  Soft, nontender, bowel sounds diminished Musculoskeletal:  Moves all extremities, no edema Skin:  Intact  LABS:  CBC  Recent Labs Lab 12/17/12 0505 12/18/12 0541 12/18/12 2204  WBC 11.9* 10.0 15.4*  HGB 11.0* 11.3* 12.2*  HCT 32.6* 33.4* 35.0*  PLT 333 356 402*   Coag's  Recent Labs Lab 12/18/12 2210  INR 0.93   BMET  Recent Labs Lab 12/17/12 0505 12/18/12 0541 12/18/12 2204  NA 149* 146* 140  K 3.5 3.6 3.9  CL 115* 113* 108  CO2 23 21 20   BUN 21 16 15   CREATININE 1.10 1.20 0.98  GLUCOSE 203* 181* 201*   Electrolytes  Recent Labs Lab 12/17/12 0505 12/18/12 0541 12/18/12 2204  CALCIUM 8.2* 8.3* 8.5  MG  --  2.2  --   PHOS  --  3.2  --    Sepsis Markers  Recent Labs Lab 12/18/12 2217  LATICACIDVEN 1.88   ABG  Recent Labs Lab 12/15/12 0830 12/18/12 2228 12/18/12 2346  PHART 7.423 7.390 7.233*  PCO2ART 43.3 38.8 53.3*  PO2ART 66.0* 469.0* 367.0*   Liver Enzymes  Recent Labs Lab 12/18/12 2204  AST 37  ALT 44  ALKPHOS 77  BILITOT 0.3  ALBUMIN 2.3*   Cardiac Enzymes  Recent Labs  Lab 12/18/12 2151  PROBNP 342.7*   Glucose  Recent Labs Lab 12/18/12 0600 12/18/12 1112 12/19/12 0135 12/19/12 0231 12/19/12 0341 12/19/12 0752  GLUCAP 174* 218* 168* 180* 164* 134*   CXR:  11/19 >>> hardware in place, bilateral airspace disease  ASSESSMENT / PLAN:  PULMONARY A:  Acute respiratory failure in setting of bilateral pulmonary embolism.  Recent Enterobacter aerogenes pneumonia. P:   Full mechanical support, high Ve Daily SBT, WUA Ventilator bundle Anticoagulation as below TTE today, evaluate for right heart strain, Pulmonary pressures   CARDIOVASCULAR A: Hypertension.  P:  Goal MAP>60 Preadmission Labetalol / lipitor, ASA  Hold HTN meds in setting of hypotension (SBP<90-100)  RENAL A:  No active issues. P:    Trend BMP NS@kvo   GASTROINTESTINAL A:  No issues. P:   NPO Protonix for GI Px  HEMATOLOGIC A:  Acute pulmonary embolism. P:  Trend CBC / APTT Heparin per Pharmacy  INFECTIOUS A:  Recovering from aspiration / enterobacter pneumonia. P:   Has almost completed 14 days abx for enterobacter PNA Trend CBC   ENDOCRINE  A:  DMII.  Hyperglycemia, P:   SSI Hold Amaryl, Jentadueto  NEUROLOGIC A:  Acute encephalopathy. P:   Goal RASS 0 to -1 Propofol gtt, decrease sedation as able   I have personally obtained history, examined patient, evaluated and interpreted laboratory and imaging results, reviewed medical records, formulated assessment / plan and placed orders.  CRITICAL CARE:  The patient is critically ill with multiple organ systems failure and requires high complexity decision making for assessment and support, frequent evaluation and titration of therapies, application of advanced monitoring technologies and extensive interpretation of multiple databases. Critical Care Time devoted to patient care services described in this note is 35 minutes.   Twana First Paulina Fusi, DO of [facility] 12/19/2012, 8:20 AM  Levy Pupa, MD, PhD 12/19/2012, 11:53 AM [facility] [phone] or if no answer [phone]

## 2012-12-20 ENCOUNTER — Inpatient Hospital Stay (HOSPITAL_COMMUNITY): Payer: BC Managed Care – PPO

## 2012-12-20 DIAGNOSIS — I2699 Other pulmonary embolism without acute cor pulmonale: Secondary | ICD-10-CM

## 2012-12-20 LAB — BASIC METABOLIC PANEL
BUN: 16 mg/dL (ref 6–23)
CO2: 21 mEq/L (ref 19–32)
Calcium: 8.4 mg/dL (ref 8.4–10.5)
Chloride: 114 mEq/L — ABNORMAL HIGH (ref 96–112)
Creatinine, Ser: 1.16 mg/dL (ref 0.50–1.35)
GFR calc non Af Amer: 68 mL/min — ABNORMAL LOW (ref >90)
Glucose, Bld: 193 mg/dL — ABNORMAL HIGH (ref 70–99)
Sodium: 145 mEq/L (ref 135–145)

## 2012-12-20 LAB — GLUCOSE, CAPILLARY
Glucose-Capillary: 115 mg/dL — ABNORMAL HIGH (ref 70–99)
Glucose-Capillary: 134 mg/dL — ABNORMAL HIGH (ref 70–99)
Glucose-Capillary: 155 mg/dL — ABNORMAL HIGH (ref 70–99)
Glucose-Capillary: 177 mg/dL — ABNORMAL HIGH (ref 70–99)
Glucose-Capillary: 189 mg/dL — ABNORMAL HIGH (ref 70–99)

## 2012-12-20 LAB — CBC
HCT: 31.8 % — ABNORMAL LOW (ref 39.0–52.0)
Hemoglobin: 11 g/dL — ABNORMAL LOW (ref 13.0–17.0)
MCV: 85.3 fL (ref 78.0–100.0)
Platelets: 285 10*3/uL (ref 150–400)
RBC: 3.73 MIL/uL — ABNORMAL LOW (ref 4.22–5.81)
RDW: 14.3 % (ref 11.5–15.5)
WBC: 18.3 10*3/uL — ABNORMAL HIGH (ref 4.0–10.5)

## 2012-12-20 LAB — URINE CULTURE: Culture: NO GROWTH

## 2012-12-20 MED ORDER — VITAL AF 1.2 CAL PO LIQD
1000.0000 mL | ORAL | Status: DC
  Filled 2012-12-20 (×2): qty 1000

## 2012-12-20 MED ORDER — WHITE PETROLATUM GEL
Status: AC
  Administered 2012-12-20: 0.2
  Filled 2012-12-20: qty 5

## 2012-12-20 MED ORDER — PANTOPRAZOLE SODIUM 40 MG PO PACK
40.0000 mg | PACK | Freq: Every day | ORAL | Status: DC
  Administered 2012-12-20 – 2012-12-22 (×3): 40 mg
  Filled 2012-12-20 (×4): qty 20

## 2012-12-20 MED ORDER — LABETALOL HCL 200 MG PO TABS
200.0000 mg | ORAL_TABLET | Freq: Once | ORAL | Status: AC
  Administered 2012-12-20: 200 mg via ORAL
  Filled 2012-12-20: qty 1

## 2012-12-20 MED ORDER — PRO-STAT SUGAR FREE PO LIQD
30.0000 mL | Freq: Three times a day (TID) | ORAL | Status: DC
  Administered 2012-12-20 – 2012-12-22 (×6): 30 mL
  Filled 2012-12-20 (×11): qty 30

## 2012-12-20 MED ORDER — ASPIRIN 81 MG PO CHEW
81.0000 mg | CHEWABLE_TABLET | Freq: Every day | ORAL | Status: DC
  Administered 2012-12-20 – 2013-01-01 (×13): 81 mg via ORAL
  Filled 2012-12-20 (×14): qty 1

## 2012-12-20 MED ORDER — CHLORHEXIDINE GLUCONATE 0.12 % MT SOLN
15.0000 mL | Freq: Two times a day (BID) | OROMUCOSAL | Status: DC
  Administered 2012-12-20 – 2012-12-23 (×7): 15 mL via OROMUCOSAL
  Filled 2012-12-20 (×7): qty 15

## 2012-12-20 MED ORDER — VITAL HIGH PROTEIN PO LIQD
1000.0000 mL | ORAL | Status: DC
  Administered 2012-12-20: 20 mL
  Administered 2012-12-21: 1000 mL
  Filled 2012-12-20 (×5): qty 1000

## 2012-12-20 MED ORDER — LABETALOL HCL 200 MG PO TABS
400.0000 mg | ORAL_TABLET | Freq: Two times a day (BID) | ORAL | Status: DC
  Administered 2012-12-20: 400 mg via ORAL
  Administered 2012-12-21 – 2012-12-22 (×2): 200 mg via ORAL
  Administered 2012-12-23: 400 mg via ORAL
  Filled 2012-12-20 (×7): qty 2

## 2012-12-20 NOTE — Progress Notes (Signed)
Respiratory therapy note-placed back to full support low VE due to sedation.

## 2012-12-20 NOTE — Progress Notes (Signed)
ANTICOAGULATION CONSULT NOTE - Follow Up Consult  Pharmacy Consult for heparin Indication: pulmonary embolus and DVT  Labs:  Recent Labs  12/18/12 0541 12/18/12 2204 12/18/12 2210 12/19/12 0954 12/19/12 1940 12/20/12 0950 12/20/12 2200  HGB 11.3* 12.2*  --   --   --  11.0*  --   HCT 33.4* 35.0*  --   --   --  31.8*  --   PLT 356 402*  --   --   --  285  --   LABPROT  --   --  12.3  --   --   --   --   INR  --   --  0.93  --   --   --   --   HEPARINUNFRC  --   --   --  0.32 0.41  --  <0.10*  CREATININE 1.20 0.98  --   --   --   --   --     Assessment: 57yo male undetectable on heparin after resumed s/p line placement; had previously been therapeutic at lower end of goal at this rate so may need longer to accumulate.  Goal of Therapy:  Heparin level 0.3-0.7 units/ml   Plan:  Will increase heparin gtt conservatively by 2 units/kg/hr to 1800 units/hr and check level with am labs.  Vernard Gambles, PharmD, BCPS  12/20/2012,11:39 PM

## 2012-12-20 NOTE — Progress Notes (Signed)
  Echocardiogram 2D Echocardiogram has been performed.  [name] FRANCES 12/20/2012, 5:33 PM

## 2012-12-20 NOTE — Procedures (Signed)
Central Venous Catheter Insertion Procedure Note Jehiel Koepp [id number] 06/22/1955  Procedure: Insertion of Central Venous Catheter Indications: Assessment of intravascular volume, Drug and/or fluid administration and Frequent blood sampling  Procedure Details Consent: Risks of procedure as well as the alternatives and risks of each were explained to the (patient/caregiver).  Consent for procedure obtained. Time Out: Verified patient identification, verified procedure, site/side was marked, verified correct patient position, special equipment/implants available, medications/allergies/relevent history reviewed, required imaging and test results available.  Performed  Maximum sterile technique was used including antiseptics, cap, gloves, gown, hand hygiene, mask and sheet. Skin prep: Chlorhexidine; local anesthetic administered A antimicrobial bonded/coated triple lumen catheter was placed in the right internal jugular vein using the Seldinger technique.  Evaluation Blood flow good Complications: No apparent complications Patient did tolerate procedure well. Chest X-ray ordered to verify placement.  CXR: pending.  Twana First Paulina Fusi, DO of [facility] 12/20/2012, 2:54 PM

## 2012-12-20 NOTE — Progress Notes (Signed)
NUTRITION FOLLOW UP  Intervention:    1. Initiate Vital High Protein @ 20 ml/hr and increase by 10 ml every 4 hours to goal rate of 50 ml/hr  2. 30 ml Prostat TID  TF regimen provides 1500 kcal, 150 grams protein, and 1003 ml   TF regimen and current propofol provides 1732 total kcal (24 kcal/kg IBW).   Nutrition Dx:   Inadequate oral intake related to inability to eat as evidenced by NPO status; ongoing.    Goal:  Enteral nutrition to provide 60-70% of estimated calorie needs (22-25 kcals/kg ideal body weight) and 100% of estimated protein needs, based on ASPEN guidelines for permissive underfeeding in critically ill obese individuals, not met.    Monitor:  TF tolerance/adequacy, weight trend, labs, vent status.  Assessment:   Patient was discharged on 11/19 after being treated for Enterobacter aerogenes pneumonia complicated by VDRF; returned to [facility] ED same day in respiratory distress. Intubated. CTA chest revealed large PE.   Patient is currently intubated on ventilator support.  MV: 21.3 L/min Temp:Temp (24hrs), Avg:101.9 F (38.8 C), Min:99.9 F (37.7 C), Max:103.8 F (39.9 C)  Propofol: 8.82 ml/hr providing 232 kcal from lipid per day  Pt with elevated MV and temps.   Height: Ht Readings from Last 1 Encounters:  12/19/12 5\' 8"  (1.727 m)    Weight Status:   Wt Readings from Last 1 Encounters:  12/20/12 210 lb 1.6 oz (95.3 kg)  Admission weight 207 lb (94 kg) Usual weight 209 lb   Re-estimated needs:  Kcal: 2596 Protein: >/= 140 grams Fluid: > 2 L/day  Skin: dry skin over area of abraised site on side of left pinky finger  Diet Order: NPO   Intake/Output Summary (Last 24 hours) at 12/20/12 1200 Last data filed at 12/20/12 1000  Gross per 24 hour  Intake 1512.33 ml  Output   1985 ml  Net -472.67 ml    Last BM: 11/19   Labs:   Recent Labs Lab 12/17/12 0505 12/18/12 0541 12/18/12 2204  NA 149* 146* 140  K 3.5 3.6 3.9  CL 115* 113* 108  CO2  23 21 20   BUN 21 16 15   CREATININE 1.10 1.20 0.98  CALCIUM 8.2* 8.3* 8.5  MG  --  2.2  --   PHOS  --  3.2  --   GLUCOSE 203* 181* 201*    CBG (last 3)   Recent Labs  12/20/12 0002 12/20/12 0340 12/20/12 0740  GLUCAP 115* 155* 177*   Lab Results  Component Value Date   HGBA1C 7.1* 09/06/2012   Scheduled Meds: . antiseptic oral rinse  1 application Mouth Rinse QID  . aspirin  81 mg Oral Daily  . atorvastatin  20 mg Oral q1800  . chlorhexidine  15 mL Mouth/Throat BID  . feeding supplement (VITAL AF 1.2 CAL)  1,000 mL Per Tube Q24H  . insulin aspart  0-15 Units Subcutaneous Q4H  . labetalol  200 mg Oral Once  . labetalol  400 mg Oral BID  . pantoprazole sodium  40 mg Per Tube Daily  . white petrolatum        Continuous Infusions: . sodium chloride 20 mL/hr (12/19/12 1335)  . fentaNYL infusion INTRAVENOUS 175 mcg/hr (12/20/12 0925)  . heparin 1,600 Units/hr (12/20/12 0800)  . propofol 15 mcg/kg/min (12/20/12 0944)    Kendell Bane RD, LDN, CNSC [phone] Pager [phone] After Hours Pager

## 2012-12-20 NOTE — Progress Notes (Signed)
Respiratory therapy note- weaning per Dr. Delton Coombes, continue to monitor.

## 2012-12-20 NOTE — ED Provider Notes (Signed)
I saw and evaluated the patient, reviewed the resident's note and I agree with the findings and plan. If applicable, I agree with the resident's interpretation of the EKG.  If applicable, I was present for critical portions of any procedures performed.  Respiratory distress, discharged today from ICU with multifocal pneumonia.  Distress, tachypneic to 50s with shallow and rhonchorous respirations. Febrile, code sepsis called, antibiotics ordered. ABG without CO2 retention or acidosis.  Bipap attempted without improvement in tachypnea and patient intubated. CT positive for bilateral PE.  BP and HR stable, d/w PCCM, will not give lytics at this time.  CRITICAL CARE Performed by: Glynn Octave Total critical care time: 30 Critical care time was exclusive of separately billable procedures and treating other patients. Critical care was necessary to treat or prevent imminent or life-threatening deterioration. Critical care was time spent personally by me on the following activities: development of treatment plan with patient and/or surrogate as well as nursing, discussions with consultants, evaluation of patient's response to treatment, examination of patient, obtaining history from patient or surrogate, ordering and performing treatments and interventions, ordering and review of laboratory studies, ordering and review of radiographic studies, pulse oximetry and re-evaluation of patient's condition.    Glynn Octave, MD 12/20/12 Paulo Fruit

## 2012-12-20 NOTE — Progress Notes (Signed)
Interim Progress Note  S: Called by RN due to hypotension to 60-70s systolic. Pt denies dizziness or discomfort at this time.  O: BP 110/69  Pulse 99  Temp(Src) 102 F (38.9 C) (Oral)  Resp 30  Ht 5\' 8"  (1.727 m)  Wt 210 lb 1.6 oz (95.3 kg)  BMI 31.95 kg/m2  SpO2 96% - more recent SBP 70 GEN: NAD, asleep CV: RRR at 93 bpm PULM: CTAB anteriorly, on vent full settings NEURO: Asleep, arousable (RASS -1 to 0), responds to commands. ABD: Soft, nontender, nondistended  A/P: Hypotension, febrile of unknown etiology, recovering from asp/enterobacter pna. Consider pulm infarct or new HCAP. Cultures pending/negative. Antihypertensive increased today from labetalol 200mg  BID to 400mg  BID. - Last CVP 4 on 11/16; recheck - Bolus 500cc afterwards and monitor - If persistently low, hold AM antihypertensive - Decrease sedation as able. - F/u cultures.  Discussed with Dr Darrick Penna via [place].  Leona Singleton, MD

## 2012-12-20 NOTE — Progress Notes (Signed)
PULMONARY  / CRITICAL CARE MEDICINE  Name: Juan Glenn MRN: [id number] DOB: [birth year]    ADMISSION DATE:  12/18/2012 CONSULTATION DATE:  12/18/2012  REFERRING MD :  EDP PRIMARY SERVICE:  PCCM  CHIEF COMPLAINT:  Acute respiratory failure  BRIEF PATIENT DESCRIPTION: 57 yo discharged 11/19 after being treated for Enterobacter aerogenes pneumonia complicated by VDRF, returned to [facility] ED same day in respiratory distress.  Intubated. CTA chest revealed large PE.  SIGNIFICANT EVENTS / STUDIES:  11/19  Respiratory distress. Intubated. 11/19  CTA chest >>> Bilateral PE, multifocal pneumonia 11/20 Echo today evaluate for Right Heart Strain  11/20 duplex > acute B LE DVT  LINES / TUBES: OETT 11/19 >>> OGT 11/19 >>> Foley 11/19 >>>  CULTURES: 11/19 Blood >>> 11/19 Urine >>>no growth final  ANTIBIOTICS: Cefepime 11/19 x 1 Vancomycin 11/19 x 1 Levaquin 11/19 >>>  INTERVAL HISTORY: Spiking high fevers overnight. Intubated, sedated.  VITAL SIGNS: Temp:  [96 F (35.6 C)-103.8 F (39.9 C)] 102.3 F (39.1 C) (11/21 0500) Pulse Rate:  [90-111] 95 (11/21 0500) Resp:  [24-35] 30 (11/21 0500) BP: (90-150)/(53-85) 111/60 mmHg (11/21 0500) SpO2:  [93 %-99 %] 94 % (11/21 0500) FiO2 (%):  [30 %] 30 % (11/21 0409) Weight:  [210 lb 1.6 oz (95.3 kg)] 210 lb 1.6 oz (95.3 kg) (11/21 0355)  HEMODYNAMICS:   VENTILATOR SETTINGS: Vent Mode:  [-] PRVC FiO2 (%):  [30 %] 30 % Set Rate:  [30 bmp] 30 bmp Vt Set:  [550 mL] 550 mL PEEP:  [5 cmH20] 5 cmH20 Plateau Pressure:  [16 cmH20-22 cmH20] 20 cmH20  INTAKE / OUTPUT: Intake/Output     11/20 0701 - 11/21 0700 11/21 0701 - 11/22 0700   I.V. (mL/kg) 1855 (19.5)    NG/GT 60    IV Piggyback 150    Total Intake(mL/kg) 2065 (21.7)    Urine (mL/kg/hr) 2285 (1)    Total Output 2285     Net -220           PHYSICAL EXAMINATION:  General:  Appears acutely ill, mechanically ventilated Neuro:  Sedated, encephalopathic  HEENT:   OETT Cardiovascular:  Regular rate and rhythm  Lungs:  Bilateral diminished air entry, no w/r/r Abdomen:  Soft, nontender, bowel sounds diminished, mildly distended Musculoskeletal:  Moves all extremities, trace LE edema at ankles bilaterally Skin:  Intact  LABS:  CBC  Recent Labs Lab 12/17/12 0505 12/18/12 0541 12/18/12 2204  WBC 11.9* 10.0 15.4*  HGB 11.0* 11.3* 12.2*  HCT 32.6* 33.4* 35.0*  PLT 333 356 402*   Coag's  Recent Labs Lab 12/18/12 2210  INR 0.93   BMET  Recent Labs Lab 12/17/12 0505 12/18/12 0541 12/18/12 2204  NA 149* 146* 140  K 3.5 3.6 3.9  CL 115* 113* 108  CO2 23 21 20   BUN 21 16 15   CREATININE 1.10 1.20 0.98  GLUCOSE 203* 181* 201*   Electrolytes  Recent Labs Lab 12/17/12 0505 12/18/12 0541 12/18/12 2204  CALCIUM 8.2* 8.3* 8.5  MG  --  2.2  --   PHOS  --  3.2  --    Sepsis Markers  Recent Labs Lab 12/18/12 2217  LATICACIDVEN 1.88   ABG  Recent Labs Lab 12/15/12 0830 12/18/12 2228 12/18/12 2346  PHART 7.423 7.390 7.233*  PCO2ART 43.3 38.8 53.3*  PO2ART 66.0* 469.0* 367.0*   Liver Enzymes  Recent Labs Lab 12/18/12 2204  AST 37  ALT 44  ALKPHOS 77  BILITOT 0.3  ALBUMIN 2.3*  Cardiac Enzymes  Recent Labs Lab 12/18/12 2151  PROBNP 342.7*   Glucose  Recent Labs Lab 12/19/12 0752 12/19/12 1117 12/19/12 1550 12/19/12 2008 12/20/12 0002 12/20/12 0340  GLUCAP 134* 121* 134* 136* 115* 155*   CXR:  11/21 >>> persistent bilateral patchy infiltrates, no significant change; NG tip below left hemidiaphragm  ASSESSMENT / PLAN:  PULMONARY A:  Acute respiratory failure in setting of bilateral pulmonary embolism.   Recent Enterobacter aerogenes pneumonia. P:   Full mechanical support Daily SBT, WUA; note high Ve now and on previous hospitalization.  Ventilator bundle Anticoagulation as below TTE ordered, evaluate for right heart strain, Pulmonary pressures   CARDIOVASCULAR A: Hypertension P:  Goal  MAP>60 (62-98) Preadmission Labetalol / lipitor, ASA > uptitrate labetalol 11/21.   RENAL A:  No active issues. P:   Trend BMP NS@kvo   GASTROINTESTINAL A:  No issues. P:   NPO Protonix for GI Px Would start tube feeds today as pt still on full vent support  HEMATOLOGIC A:  Acute pulmonary embolism. P:  Trend CBC / APTT Continue heparin per Pharmacy  INFECTIOUS A:  Recovering from aspiration / enterobacter pneumonia. Febrile 11/21 to 103.49F > consider new HCAP, consider pulm infarct P:   Has almost completed 14 days abx for enterobacter PNA, last dose 11/21. However, with new fever, would recheck blood and urine cultures, CBC, and continue longer course abx. PICC due to difficult stick and PIVs not allowing blood draws.   ENDOCRINE  A:  DMII.  Hyperglycemia, P:   SSI Hold Amaryl, Jentadueto  NEUROLOGIC A:  Acute encephalopathy. P:   Goal RASS 0 to -1 Propofol gtt at 30, fentanyl at 125; decreasing sedation as able   Leona Singleton, MD of [facility] 12/20/2012, 7:42 AM  Today's summary: Needs echo, start tube feeds, wean sedation, work up spiked fever.  35 minutes CC time  Levy Pupa, MD, PhD 12/20/2012, 9:47 AM [facility] [phone] or if no answer [phone]

## 2012-12-20 NOTE — Progress Notes (Signed)
Notified Dr. Benjamin Stain that the patient's temperature fluctuates between 100-103 with a current temp of 102.5 despite 2 doses of tylenol and ice-packs. Also, it was reported that the labs could not be obtained by 2 phlebotomist despite a foot order.

## 2012-12-21 ENCOUNTER — Inpatient Hospital Stay (HOSPITAL_COMMUNITY): Payer: BC Managed Care – PPO

## 2012-12-21 LAB — BASIC METABOLIC PANEL
BUN: 23 mg/dL (ref 6–23)
BUN: 24 mg/dL — ABNORMAL HIGH (ref 6–23)
CO2: 19 mEq/L (ref 19–32)
Calcium: 8 mg/dL — ABNORMAL LOW (ref 8.4–10.5)
Chloride: 115 mEq/L — ABNORMAL HIGH (ref 96–112)
Creatinine, Ser: 1.51 mg/dL — ABNORMAL HIGH (ref 0.50–1.35)
Creatinine, Ser: 1.54 mg/dL — ABNORMAL HIGH (ref 0.50–1.35)
GFR calc Af Amer: 56 mL/min — ABNORMAL LOW (ref >90)
GFR calc non Af Amer: 48 mL/min — ABNORMAL LOW (ref >90)
Glucose, Bld: 239 mg/dL — ABNORMAL HIGH (ref 70–99)
Glucose, Bld: 290 mg/dL — ABNORMAL HIGH (ref 70–99)
Potassium: 4.4 mEq/L (ref 3.5–5.1)

## 2012-12-21 LAB — CBC
HCT: 27.6 % — ABNORMAL LOW (ref 39.0–52.0)
Hemoglobin: 9.3 g/dL — ABNORMAL LOW (ref 13.0–17.0)
MCV: 87.1 fL (ref 78.0–100.0)
Platelets: 370 10*3/uL (ref 150–400)
RBC: 3.17 MIL/uL — ABNORMAL LOW (ref 4.22–5.81)
RDW: 14.2 % (ref 11.5–15.5)
WBC: 17.1 10*3/uL — ABNORMAL HIGH (ref 4.0–10.5)

## 2012-12-21 LAB — URINE CULTURE: Culture: NO GROWTH

## 2012-12-21 LAB — GLUCOSE, CAPILLARY
Glucose-Capillary: 208 mg/dL — ABNORMAL HIGH (ref 70–99)
Glucose-Capillary: 280 mg/dL — ABNORMAL HIGH (ref 70–99)
Glucose-Capillary: 261 mg/dL — ABNORMAL HIGH (ref 70–99)

## 2012-12-21 LAB — HEPARIN LEVEL (UNFRACTIONATED)
Heparin Unfractionated: 0.23 IU/mL — ABNORMAL LOW (ref 0.30–0.70)
Heparin Unfractionated: 0.18 IU/mL — ABNORMAL LOW (ref 0.30–0.70)

## 2012-12-21 MED ORDER — SODIUM CHLORIDE 0.9 % IV BOLUS (SEPSIS)
500.0000 mL | Freq: Once | INTRAVENOUS | Status: AC
  Administered 2012-12-21: 500 mL via INTRAVENOUS

## 2012-12-21 MED ORDER — HEPARIN BOLUS VIA INFUSION
3000.0000 [IU] | Freq: Once | INTRAVENOUS | Status: AC
  Administered 2012-12-21: 3000 [IU] via INTRAVENOUS
  Filled 2012-12-21: qty 3000

## 2012-12-21 MED ORDER — INSULIN ASPART 100 UNIT/ML ~~LOC~~ SOLN
2.0000 [IU] | SUBCUTANEOUS | Status: DC
  Administered 2012-12-21 (×3): 6 [IU] via SUBCUTANEOUS

## 2012-12-21 MED ORDER — HEPARIN (PORCINE) IN NACL 100-0.45 UNIT/ML-% IJ SOLN
2400.0000 [IU]/h | INTRAMUSCULAR | Status: AC
  Administered 2012-12-22 – 2012-12-23 (×5): 2600 [IU]/h via INTRAVENOUS
  Administered 2012-12-24 (×2): 2400 [IU]/h via INTRAVENOUS
  Filled 2012-12-21 (×11): qty 250

## 2012-12-21 MED ORDER — SODIUM CHLORIDE 0.9 % IV SOLN
INTRAVENOUS | Status: DC
  Administered 2012-12-21: 1.7 [IU]/h via INTRAVENOUS
  Administered 2012-12-22: 1.9 [IU]/h via INTRAVENOUS
  Filled 2012-12-21 (×2): qty 1

## 2012-12-21 MED ORDER — PIPERACILLIN-TAZOBACTAM 3.375 G IVPB
3.3750 g | Freq: Three times a day (TID) | INTRAVENOUS | Status: AC
  Administered 2012-12-21 – 2012-12-28 (×21): 3.375 g via INTRAVENOUS
  Filled 2012-12-21 (×22): qty 50

## 2012-12-21 MED ORDER — VANCOMYCIN HCL 10 G IV SOLR
1250.0000 mg | Freq: Two times a day (BID) | INTRAVENOUS | Status: DC
  Administered 2012-12-21 – 2012-12-22 (×3): 1250 mg via INTRAVENOUS
  Filled 2012-12-21 (×4): qty 1250

## 2012-12-21 NOTE — Progress Notes (Signed)
ANTICOAGULATION CONSULT NOTE - Follow Up Consult  Pharmacy Consult for heparin Indication: pulmonary embolus and DVT  Labs:  Recent Labs  12/20/12 0950  12/21/12 0500 12/21/12 1156 12/21/12 1205 12/21/12 2145  HGB 11.0*  --  9.3*  --   --   --   HCT 31.8*  --  27.6*  --   --   --   PLT 285  --  370  --   --   --   HEPARINUNFRC  --   < > <0.10* 0.23*  --  0.18*  CREATININE  --   --  1.51*  --  1.54*  --   < > = values in this interval not displayed.   Assessment: 57yo male remains subtherapeutic on heparin despite rate increases.  Goal of Therapy:  Heparin level 0.3-0.7 units/ml   Plan:  Will rebolus with heparin 3000 units x1 and increase gtt by 3 units/kg/hr to 2600 units/hr and check level in 6hr with am labs.  Vernard Gambles, PharmD, BCPS  12/21/2012,10:43 PM

## 2012-12-21 NOTE — Progress Notes (Signed)
Called to bedside by RN d/t pt desat 82%, increased HR, Increased RR 54 bpm, increased WOB noted.  Placed pt back on rest mode.  , sats improved to 92-93%, RN aware.

## 2012-12-21 NOTE — Progress Notes (Signed)
ANTICOAGULATION CONSULT NOTE - Follow Up Consult  Pharmacy Consult for heparin Indication: pulmonary embolus and DVT  Labs:  Recent Labs  12/18/12 2204 12/18/12 2210  12/19/12 1940 12/20/12 0950 12/20/12 2200 12/21/12 0500  HGB 12.2*  --   --   --  11.0*  --   --   HCT 35.0*  --   --   --  31.8*  --   --   PLT 402*  --   --   --  285  --   --   LABPROT  --  12.3  --   --   --   --   --   INR  --  0.93  --   --   --   --   --   HEPARINUNFRC  --   --   < > 0.41  --  <0.10* <0.10*  CREATININE 0.98  --   --   --   --   --   --   < > = values in this interval not displayed.   Assessment: 57yo male remains undetectable on heparin despite rate increase.  Goal of Therapy:  Heparin level 0.3-0.7 units/ml   Plan:  Will rebolus with heparin 3000 units x1 and increase gtt by 3 units/kg/hr to 2100 units/hr and check level in 6hr.  Vernard Gambles, PharmD, BCPS  12/21/2012,6:01 AM

## 2012-12-21 NOTE — Progress Notes (Signed)
ANTICOAGULATION CONSULT NOTE - Follow Up Consult  Pharmacy Consult for heparin Indication: pulmonary embolus and DVT  Labs:  Recent Labs  12/18/12 2204 12/18/12 2210  12/20/12 0950 12/20/12 2200 12/21/12 0500 12/21/12 1156 12/21/12 1205  HGB 12.2*  --   --  11.0*  --  9.3*  --   --   HCT 35.0*  --   --  31.8*  --  27.6*  --   --   PLT 402*  --   --  285  --  370  --   --   LABPROT  --  12.3  --   --   --   --   --   --   INR  --  0.93  --   --   --   --   --   --   HEPARINUNFRC  --   --   < >  --  <0.10* <0.10* 0.23*  --   CREATININE 0.98  --   --   --   --  1.51*  --  1.54*  < > = values in this interval not displayed.   Assessment: 57yo male with acute PE remains on heparin with low level despite rate increase. No bleeding issues noted, hgb continues to trend down.  Goal of Therapy:  Heparin level 0.3-0.7 units/ml   Plan:  Increase heparin to 2300 units/hr Recheck heparin level tonight then daily  Sheppard Coil PharmD., BCPS Clinical Pharmacist Pager [phone] 12/21/2012 3:26 PM

## 2012-12-21 NOTE — Progress Notes (Addendum)
ANTIBIOTIC CONSULT NOTE - INITIAL  Pharmacy Consult:  Vancomycin / Zosyn Indication:  Suspected PNA  No Known Allergies  Patient Measurements: Height: 5\' 8"  (172.7 cm) Weight: 216 lb 7.9 oz (98.2 kg) IBW/kg (Calculated) : 68.4  Vital Signs: Temp: 99.2 F (37.3 C) (11/22 0749) Temp src: Oral (11/22 0749) BP: 153/72 mmHg (11/22 0815) Pulse Rate: 119 (11/22 0838) Intake/Output from previous day: 11/21 0701 - 11/22 0700 In: 1744.4 [I.V.:1174.4; NG/GT:570] Out: 1925 [Urine:1925] Intake/Output from this shift: Total I/O In: -  Out: 190 [Urine:190]  Labs:  Recent Labs  12/18/12 2204 12/20/12 0950 12/21/12 0500  WBC 15.4* 18.3* 17.1*  HGB 12.2* 11.0* 9.3*  PLT 402* 285 370  CREATININE 0.98  --  1.51*   Estimated Creatinine Clearance: 60.6 ml/min (by C-G formula based on Cr of 1.51). No results found for this basename: Rolm Gala, VANCORANDOM, GENTTROUGH, GENTPEAK, GENTRANDOM, TOBRATROUGH, TOBRAPEAK, TOBRARND, AMIKACINPEAK, AMIKACINTROU, AMIKACIN,  in the last 72 hours   Microbiology: Recent Results (from the past 720 hour(s))  URINE CULTURE     Status: None   Collection Time    12/05/12 12:41 AM      Result Value Range Status   Specimen Description URINE, RANDOM   Final   Special Requests Normal   Final   Culture  Setup Time     Final   Value: 12/05/2012 02:45     Performed at [facility] Count     Final   Value: NO GROWTH     Performed at [facility]   Culture     Final   Value: NO GROWTH     Performed at [facility]   Report Status 12/06/2012 FINAL   Final  MRSA PCR SCREENING     Status: None   Collection Time    12/05/12  4:47 AM      Result Value Range Status   MRSA by PCR NEGATIVE  NEGATIVE Final   Comment:            The GeneXpert MRSA Assay (FDA     approved for NASAL specimens     only), is one component of a     comprehensive MRSA colonization     surveillance program. It is not     intended to  diagnose MRSA     infection nor to guide or     monitor treatment for     MRSA infections.  CULTURE, BLOOD (ROUTINE X 2)     Status: None   Collection Time    12/05/12  5:30 AM      Result Value Range Status   Specimen Description BLOOD RIGHT HAND   Final   Special Requests BOTTLES DRAWN AEROBIC ONLY 2CC   Final   Culture  Setup Time     Final   Value: 12/05/2012 09:24     Performed at [facility]   Culture     Final   Value: NO GROWTH 5 DAYS     Performed at [facility]   Report Status 12/11/2012 FINAL   Final  CULTURE, BLOOD (ROUTINE X 2)     Status: None   Collection Time    12/05/12  5:45 AM      Result Value Range Status   Specimen Description BLOOD RIGHT HAND   Final   Special Requests BOTTLES DRAWN AEROBIC ONLY 3CC   Final   Culture  Setup Time     Final  Value: 12/05/2012 09:24     Performed at [facility]   Culture     Final   Value: NO GROWTH 5 DAYS     Performed at [facility]   Report Status 12/11/2012 FINAL   Final  URINE CULTURE     Status: None   Collection Time    12/05/12  8:44 AM      Result Value Range Status   Specimen Description URINE, CATHETERIZED   Final   Special Requests NONE   Final   Culture  Setup Time     Final   Value: 12/05/2012 16:14     Performed at [facility] Count     Final   Value: NO GROWTH     Performed at [facility]   Culture     Final   Value: NO GROWTH     Performed at [facility]   Report Status 12/06/2012 FINAL   Final  CULTURE, RESPIRATORY (NON-EXPECTORATED)     Status: None   Collection Time    12/05/12  7:49 PM      Result Value Range Status   Specimen Description TRACHEAL ASPIRATE   Final   Special Requests NONE   Final   Gram Stain     Final   Value: ABUNDANT WBC PRESENT, PREDOMINANTLY PMN     RARE SQUAMOUS EPITHELIAL CELLS PRESENT     FEW GRAM POSITIVE COCCI     IN PAIRS RARE YEAST     Performed at [facility]   Culture      Final   Value: FEW CANDIDA ALBICANS     Performed at [facility]   Report Status 12/08/2012 FINAL   Final  URINE CULTURE     Status: None   Collection Time    12/09/12  4:57 PM      Result Value Range Status   Specimen Description URINE, CATHETERIZED   Final   Special Requests NONE   Final   Culture  Setup Time     Final   Value: 12/09/2012 17:24     Performed at [facility]   Culture     Final   Value: NO GROWTH     Performed at [facility]   Report Status 12/10/2012 FINAL   Final  CULTURE, BLOOD (ROUTINE X 2)     Status: None   Collection Time    12/09/12  7:37 PM      Result Value Range Status   Specimen Description BLOOD ARM LEFT   Final   Special Requests BOTTLES DRAWN AEROBIC ONLY 2CC   Final   Culture  Setup Time     Final   Value: 12/10/2012 01:10     Performed at [facility]   Culture     Final   Value: NO GROWTH 5 DAYS     Performed at [facility]   Report Status 12/16/2012 FINAL   Final  CULTURE, BLOOD (ROUTINE X 2)     Status: None   Collection Time    12/09/12  7:42 PM      Result Value Range Status   Specimen Description BLOOD HAND LEFT   Final   Special Requests BOTTLES DRAWN AEROBIC ONLY 3CC   Final   Culture  Setup Time     Final   Value: 12/10/2012 01:10     Performed at [facility]  Final   Value: NO GROWTH 5 DAYS     Performed at [facility]   Report Status 12/16/2012 FINAL   Final  CULTURE, RESPIRATORY (NON-EXPECTORATED)     Status: None   Collection Time    12/09/12  8:14 PM      Result Value Range Status   Specimen Description TRACHEAL ASPIRATE   Final   Special Requests NONE   Final   Gram Stain     Final   Value: ABUNDANT WBC PRESENT,BOTH PMN AND MONONUCLEAR     RARE SQUAMOUS EPITHELIAL CELLS PRESENT     NO ORGANISMS SEEN     Performed at [facility]   Culture     Final   Value: FEW ENTEROBACTER AEROGENES     Performed at [facility]   Report  Status 12/12/2012 FINAL   Final   Organism ID, Bacteria ENTEROBACTER AEROGENES   Final  CULTURE, BLOOD (ROUTINE X 2)     Status: None   Collection Time    12/18/12 10:20 PM      Result Value Range Status   Specimen Description BLOOD LEFT ARM   Final   Special Requests BOTTLES DRAWN AEROBIC AND ANAEROBIC 9CC   Final   Culture  Setup Time     Final   Value: 12/19/2012 04:45     Performed at [facility]   Culture     Final   Value:        BLOOD CULTURE RECEIVED NO GROWTH TO DATE CULTURE WILL BE HELD FOR 5 DAYS BEFORE ISSUING A FINAL NEGATIVE REPORT     Performed at [facility]   Report Status PENDING   Incomplete  CULTURE, BLOOD (ROUTINE X 2)     Status: None   Collection Time    12/18/12 10:30 PM      Result Value Range Status   Specimen Description BLOOD RIGHT ARM   Final   Special Requests BOTTLES DRAWN AEROBIC ONLY 10CC   Final   Culture  Setup Time     Final   Value: 12/19/2012 04:44     Performed at [facility]   Culture     Final   Value:        BLOOD CULTURE RECEIVED NO GROWTH TO DATE CULTURE WILL BE HELD FOR 5 DAYS BEFORE ISSUING A FINAL NEGATIVE REPORT     Performed at [facility]   Report Status PENDING   Incomplete  URINE CULTURE     Status: None   Collection Time    12/18/12 11:15 PM      Result Value Range Status   Specimen Description URINE, CATHETERIZED   Final   Special Requests NONE   Final   Culture  Setup Time     Final   Value: 12/19/2012 05:13     Performed at [facility] Count     Final   Value: NO GROWTH     Performed at [facility]   Culture     Final   Value: NO GROWTH     Performed at [facility]   Report Status 12/20/2012 FINAL   Final  MRSA PCR SCREENING     Status: None   Collection Time    12/19/12  2:32 AM      Result Value Range Status   MRSA by PCR NEGATIVE  NEGATIVE Final   Comment:  The GeneXpert MRSA Assay (FDA     approved for NASAL specimens      only), is one component of a     comprehensive MRSA colonization     surveillance program. It is not     intended to diagnose MRSA     infection nor to guide or     monitor treatment for     MRSA infections.    Medical History: Past Medical History  Diagnosis Date  . Diabetes mellitus without complication 2009  . Renal disorder   . Hyperlipidemia   . Difficult intubation       Assessment: 57 YOM discharged 12/18/12 after being treated for Enterobacter aerogenes pneumonia complicated by VDRF.  Patient returned to [facility] on the same day with respiratory distress, intubated, and found to have large PE on CTA and B/L DVTs on Doppler.  Now with suspected PNA and Pharmacy consulted to manage vancomycin and Zosyn.  Patient is currently in acute renal failure (SCr 0.98 >> 1.51) but her UOP remains adequate (0.8 ml/kg/hr yesterday).  He continues to have a fever and increased WBC.  Vanc 11/19 x 1, con't 11/22 >> Zosyn 11/22 >> Cefepime 11/19 x 1 LVQ PTA >> 11/21 (completed PTA course)  11/19 blood cx - NGTD 11/19 urine cx - negative 11/21 blood cx - collected 11/21 urine cx - collected 11/21 resp cx - collected   Goal of Therapy:  Vancomycin trough level 15-20 mcg/ml HL 0.3-0.7 units/mL   Plan:  - Vanc 1250mg  IV Q12H - Zosyn 3.375gm IV Q8H, 4 hr infusion - Monitor renal fxn, clinical course, vanc trough at Css d/t current renal function - Continue heparin gtt at 2100 units/hr - F/U heparin level at 1230 - Daily HL / CBC - Consider adding TF coverage or resuming home med linagliptin or adding long-acting insulin for better glycemic control   Jordell Outten D. Laney Potash, PharmD, BCPS Pager:  [phone] 12/21/2012, 9:35 AM

## 2012-12-21 NOTE — Progress Notes (Signed)
PULMONARY  / CRITICAL CARE MEDICINE  Name: Juan Glenn MRN: [id number] DOB: [birth year]    ADMISSION DATE:  12/18/2012 CONSULTATION DATE:  12/18/2012  REFERRING MD :  EDP PRIMARY SERVICE:  PCCM  CHIEF COMPLAINT:  Acute respiratory failure  BRIEF PATIENT DESCRIPTION: 57 yo discharged 11/19 after being treated for Enterobacter aerogenes pneumonia complicated by VDRF, returned to [facility] ED same day in respiratory distress.  Intubated. CTA chest revealed large PE.  SIGNIFICANT EVENTS / STUDIES:  11/19  Respiratory distress. Intubated. 11/19  CTA chest >>> Bilateral PE, multifocal pneumonia 11/20 duplex > acute B LE DVT 11/21 echo done > EF 55-60, no right heart strain noted 11/21 with continued fevers, recultured.  11/21 1L bolus with hypotension>>improved  LINES / TUBES: OETT 11/19 >>> OGT 11/19 >>> Foley 11/19 >>>  CULTURES: 11/19 Blood >>> 11/19 Urine >>>no growth final 11/21 blood culture>>> 11/21 urine culture>>> 11/21 respiratory culture>>>  ANTIBIOTICS: Cefepime 11/19 x 1 Vancomycin 11/19 x 1 Levaquin 11/19 >>>11/21  INTERVAL HISTORY: Continues spiking fevers. Follows commands but sedated this morning. Hypotensive overnight to 60s systolic, given 1L bolus, currently normotensive.  VITAL SIGNS: Temp:  [99.2 F (37.3 C)-103.2 F (39.6 C)] 99.2 F (37.3 C) (11/22 0749) Pulse Rate:  [92-118] 109 (11/22 0815) Resp:  [14-34] 27 (11/22 0815) BP: (67-180)/(37-90) 153/72 mmHg (11/22 0815) SpO2:  [90 %-97 %] 92 % (11/22 0815) FiO2 (%):  [30 %] 30 % (11/22 0815) Weight:  [98.2 kg (216 lb 7.9 oz)] 98.2 kg (216 lb 7.9 oz) (11/22 0500)  HEMODYNAMICS: CVP:  [10 mmHg] 10 mmHg VENTILATOR SETTINGS: Vent Mode:  [-] CPAP;PSV FiO2 (%):  [30 %] 30 % Set Rate:  [30 bmp] 30 bmp Vt Set:  [550 mL] 550 mL PEEP:  [5 cmH20] 5 cmH20 Pressure Support:  [15 cmH20-30 cmH20] 15 cmH20 Plateau Pressure:  [20 cmH20-23 cmH20] 21 cmH20  INTAKE / OUTPUT: Intake/Output     11/21 0701  - 11/22 0700 11/22 0701 - 11/23 0700   I.V. (mL/kg) 1174.4 (12)    NG/GT 570    IV Piggyback     Total Intake(mL/kg) 1744.4 (17.8)    Urine (mL/kg/hr) 1925 (0.8) 190 (1.1)   Total Output 1925 190   Net -180.6 -190         PHYSICAL EXAMINATION:  General:  Appears acutely ill, mechanically ventilated Neuro:  Sedated, encephalopathic, RASS-1, follows commands HEENT:  OETT Cardiovascular: Regular rate and rhythm ; right DP pulse not palpable, left trace Lungs:  Bilateral air entry, no w/r/r Abdomen:  Soft, nontender, hyperactive BS, mildly distended Musculoskeletal:  Moves all extremities, 1+ LE edema at ankles bilaterally Skin:  Intact  LABS:  CBC  Recent Labs Lab 12/18/12 2204 12/20/12 0950 12/21/12 0500  WBC 15.4* 18.3* 17.1*  HGB 12.2* 11.0* 9.3*  HCT 35.0* 31.8* 27.6*  PLT 402* 285 370   Coag's  Recent Labs Lab 12/18/12 2210  INR 0.93   BMET  Recent Labs Lab 12/18/12 0541 12/18/12 2204 12/21/12 0500  NA 146* 140 145  K 3.6 3.9 4.1  CL 113* 108 115*  CO2 21 20 19   BUN 16 15 23   CREATININE 1.20 0.98 1.51*  GLUCOSE 181* 201* 239*   Electrolytes  Recent Labs Lab 12/18/12 0541 12/18/12 2204 12/21/12 0500  CALCIUM 8.3* 8.5 8.1*  MG 2.2  --   --   PHOS 3.2  --   --    Sepsis Markers  Recent Labs Lab 12/18/12 2217  LATICACIDVEN 1.88  ABG  Recent Labs Lab 12/15/12 0830 12/18/12 2228 12/18/12 2346  PHART 7.423 7.390 7.233*  PCO2ART 43.3 38.8 53.3*  PO2ART 66.0* 469.0* 367.0*   Liver Enzymes  Recent Labs Lab 12/18/12 2204  AST 37  ALT 44  ALKPHOS 77  BILITOT 0.3  ALBUMIN 2.3*   Cardiac Enzymes  Recent Labs Lab 12/18/12 2151  PROBNP 342.7*   Glucose  Recent Labs Lab 12/20/12 1129 12/20/12 1600 12/20/12 2011 12/21/12 0011 12/21/12 0342 12/21/12 0740  GLUCAP 147* 154* 189* 236* 207* 208*   CXR:  11/22>> B alveolar infiltrates  ASSESSMENT / PLAN:  PULMONARY A:  Acute respiratory failure in setting of bilateral  pulmonary embolism.   Recent Enterobacter aerogenes pneumonia. Suspected new HCAP TTE with no right heart strain or elevated P pressure P:   Full mechanical support Daily SBT, WUA; note high Ve now and on previous hospitalization.  Ventilator bundle Anticoagulation as below  CARDIOVASCULAR A: Hypertension P:  Goal MAP>60 (43-124) Preadmission Labetalol / lipitor, ASA > uptitrated labetalol 11/21>>hold or decrease dose 11/22 if any further hypotension   RENAL A:  Acute renal failure, non-oliguric; suspect ATN following transient shock; CVP 10 P:   Check renal US Follow CVP, goal > 10 Trend BMP,  NS@kvo   GASTROINTESTINAL A:  No issues. P:   NPO TF started 11/21 Protonix for GI Px, d/c today if tolerating TF  HEMATOLOGIC A:  Acute pulmonary embolism. Anemia, likely dilutional with no active bleed P:  Trend CBC / APTT Continue heparin per Pharmacy  INFECTIOUS A:  Recovering from aspiration / enterobacter pneumonia.  Completed tx 11/21 with 14 d abx for enterobacter PNA  Consider new HCAP, consider pulm infarct P:   F/u cultures  Central line placed, use for blood draws with PIVs not functional for draws Start vanco + zosyn 11/22  ENDOCRINE  A:  DMII.  Hyperglycemia P:   SSI, change to ICU protocol Hold Amaryl, Jentadueto  NEUROLOGIC A:  Acute encephalopathy. P:   Goal RASS 0 to -1 Propofol gtt at , fentanyl at ; decreasing sedation as able   of [facility] 12/21/2012, 8:45 AM  Today's summary: F/u workup for spiked fever, continue to wean vent and sedation.  35 minutes CC time  Levy Pupa, MD, PhD 12/21/2012, 8:50 AM [facility] [phone] or if no answer [phone]

## 2012-12-22 ENCOUNTER — Inpatient Hospital Stay (HOSPITAL_COMMUNITY): Payer: BC Managed Care – PPO

## 2012-12-22 LAB — GLUCOSE, CAPILLARY
Glucose-Capillary: 234 mg/dL — ABNORMAL HIGH (ref 70–99)
Glucose-Capillary: 234 mg/dL — ABNORMAL HIGH (ref 70–99)
Glucose-Capillary: 211 mg/dL — ABNORMAL HIGH (ref 70–99)
Glucose-Capillary: 152 mg/dL — ABNORMAL HIGH (ref 70–99)
Glucose-Capillary: 147 mg/dL — ABNORMAL HIGH (ref 70–99)
Glucose-Capillary: 139 mg/dL — ABNORMAL HIGH (ref 70–99)
Glucose-Capillary: 159 mg/dL — ABNORMAL HIGH (ref 70–99)
Glucose-Capillary: 156 mg/dL — ABNORMAL HIGH (ref 70–99)
Glucose-Capillary: 161 mg/dL — ABNORMAL HIGH (ref 70–99)
Glucose-Capillary: 160 mg/dL — ABNORMAL HIGH (ref 70–99)
Glucose-Capillary: 139 mg/dL — ABNORMAL HIGH (ref 70–99)
Glucose-Capillary: 147 mg/dL — ABNORMAL HIGH (ref 70–99)
Glucose-Capillary: 149 mg/dL — ABNORMAL HIGH (ref 70–99)
Glucose-Capillary: 123 mg/dL — ABNORMAL HIGH (ref 70–99)
Glucose-Capillary: 106 mg/dL — ABNORMAL HIGH (ref 70–99)
Glucose-Capillary: 112 mg/dL — ABNORMAL HIGH (ref 70–99)
Glucose-Capillary: 140 mg/dL — ABNORMAL HIGH (ref 70–99)

## 2012-12-22 LAB — CBC
HCT: 23.8 % — ABNORMAL LOW (ref 39.0–52.0)
Hemoglobin: 8.2 g/dL — ABNORMAL LOW (ref 13.0–17.0)
MCHC: 34.5 g/dL (ref 30.0–36.0)
MCV: 86.2 fL (ref 78.0–100.0)

## 2012-12-22 LAB — BASIC METABOLIC PANEL
CO2: 21 mEq/L (ref 19–32)
Calcium: 8.1 mg/dL — ABNORMAL LOW (ref 8.4–10.5)
Chloride: 119 mEq/L — ABNORMAL HIGH (ref 96–112)
GFR calc non Af Amer: 43 mL/min — ABNORMAL LOW (ref >90)
Glucose, Bld: 152 mg/dL — ABNORMAL HIGH (ref 70–99)
Sodium: 149 mEq/L — ABNORMAL HIGH (ref 135–145)

## 2012-12-22 LAB — CULTURE, RESPIRATORY W GRAM STAIN

## 2012-12-22 LAB — CULTURE, RESPIRATORY

## 2012-12-22 LAB — HEPARIN LEVEL (UNFRACTIONATED)
Heparin Unfractionated: 0.44 IU/mL (ref 0.30–0.70)
Heparin Unfractionated: 0.52 IU/mL (ref 0.30–0.70)

## 2012-12-22 LAB — ANTITHROMBIN III: AntiThromb III Func: 38 % — ABNORMAL LOW (ref 75–120)

## 2012-12-22 MED ORDER — INSULIN GLARGINE 100 UNIT/ML ~~LOC~~ SOLN
20.0000 [IU] | SUBCUTANEOUS | Status: DC
  Administered 2012-12-22 – 2012-12-23 (×2): 20 [IU] via SUBCUTANEOUS
  Filled 2012-12-22 (×3): qty 0.2

## 2012-12-22 MED ORDER — INSULIN GLARGINE 100 UNIT/ML ~~LOC~~ SOLN
35.0000 [IU] | SUBCUTANEOUS | Status: DC
  Filled 2012-12-22 (×2): qty 0.35

## 2012-12-22 MED ORDER — VANCOMYCIN HCL IN DEXTROSE 750-5 MG/150ML-% IV SOLN
750.0000 mg | Freq: Two times a day (BID) | INTRAVENOUS | Status: AC
  Administered 2012-12-22 – 2012-12-29 (×14): 750 mg via INTRAVENOUS
  Filled 2012-12-22 (×16): qty 150

## 2012-12-22 MED ORDER — DEXTROSE 10 % IV SOLN: INTRAVENOUS | Status: DC | PRN

## 2012-12-22 MED ORDER — LABETALOL HCL 5 MG/ML IV SOLN
10.0000 mg | INTRAVENOUS | Status: DC | PRN
  Administered 2012-12-23 (×2): 10 mg via INTRAVENOUS
  Filled 2012-12-22 (×3): qty 4

## 2012-12-22 MED ORDER — INSULIN ASPART 100 UNIT/ML ~~LOC~~ SOLN
2.0000 [IU] | SUBCUTANEOUS | Status: DC
  Administered 2012-12-23 (×4): 4 [IU] via SUBCUTANEOUS
  Administered 2012-12-23: 2 [IU] via SUBCUTANEOUS

## 2012-12-22 MED ORDER — DEXMEDETOMIDINE HCL IN NACL 400 MCG/100ML IV SOLN
0.4000 ug/kg/h | INTRAVENOUS | Status: DC
  Administered 2012-12-22: 1.2 ug/kg/h via INTRAVENOUS
  Administered 2012-12-22: 1 ug/kg/h via INTRAVENOUS
  Administered 2012-12-22: 1.2 ug/kg/h via INTRAVENOUS
  Filled 2012-12-22 (×2): qty 50
  Filled 2012-12-22: qty 100

## 2012-12-22 MED ORDER — INSULIN ASPART 100 UNIT/ML ~~LOC~~ SOLN: 5.0000 [IU] | SUBCUTANEOUS | Status: DC

## 2012-12-22 MED ORDER — FREE WATER
200.0000 mL | Status: DC
  Administered 2012-12-22: 200 mL

## 2012-12-22 NOTE — Progress Notes (Addendum)
ANTICOAGULATION CONSULT NOTE  Pharmacy Consult for heparin Indication: pulmonary embolus and DVT  Labs:  Recent Labs  12/20/12 0950  12/21/12 0500 12/21/12 1156 12/21/12 1205 12/21/12 2145 12/22/12 0418  HGB 11.0*  --  9.3*  --   --   --  8.2*  HCT 31.8*  --  27.6*  --   --   --  23.8*  PLT 285  --  370  --   --   --  327  HEPARINUNFRC  --   < > <0.10* 0.23*  --  0.18* 0.44  CREATININE  --   --  1.51*  --  1.54*  --  1.68*  < > = values in this interval not displayed.   Assessment: 57yo male with acute PE/dvt remains on full anticoagulation with heparin which is now within goal range after rate increase overnight. No bleeding issues noted, hgb continues to trend down. Patient is requiring large amounts of IV heparin to achieve target range. ATIII level was checked and resulted low at 38%. Consider heme consult if further factor deficiency is to be considered. Low ATIII could explain multiple clots and does help clarify high heparin requirements.  Goal of Therapy:  Heparin level 0.3-0.7 units/ml   Plan:  Continue heparin at 2600 units/hr Recheck heparin level this afternoon to confirm then daily  Sheppard Coil PharmD., BCPS Clinical Pharmacist Pager [phone] 12/22/2012 10:41 AM  Confirmatory heparin level within desired goal. No bleeding issues noted. With low ATIII could consider changing anticoagulation to lovenox when more stable and no further procedures are planned as it is much less dependent on ATIII for its therapeutic anticoagulation. Repleting AT is an option but likely cost prohibitive.  Plan Continue heparin at 2600 units/hr Follow up with [facility] in am.  Sheppard Coil PharmD., BCPS Clinical Pharmacist Pager [phone] 12/22/2012 1:41 PM

## 2012-12-22 NOTE — Progress Notes (Addendum)
PULMONARY  / CRITICAL CARE MEDICINE  Name: Juan Glenn MRN: [id number] DOB: 1955-06-18    ADMISSION DATE:  12/18/2012 CONSULTATION DATE:  12/18/2012  REFERRING MD :  EDP PRIMARY SERVICE:  PCCM  CHIEF COMPLAINT:  Acute respiratory failure  BRIEF PATIENT DESCRIPTION: 57 yo discharged 11/19 after being treated for Enterobacter aerogenes pneumonia complicated by VDRF, returned to [facility] ED same day in respiratory distress.  Intubated. CTA chest revealed large PE.  SIGNIFICANT EVENTS / STUDIES:  11/19  Respiratory distress. Intubated. 11/19  CTA chest >>> Bilateral PE, multifocal pneumonia 11/20 duplex > acute B LE DVT 11/21 echo done > EF 55-60, no right heart strain noted 11/21 with continued fevers, recultured.  11/21 1L bolus with hypotension>>improved 11/22 Renal US >> negative  LINES / TUBES: OETT 11/19 >>> OGT 11/19 >>> Foley 11/19 >>>  CULTURES: 11/19 Blood >>> 11/19 Urine >>>no growth final 11/21 blood culture>>> 11/21 urine culture>>>  11/21 respiratory culture>>>  ANTIBIOTICS: Cefepime 11/19 x 1 Vancomycin 11/19 x 1 Levaquin 11/19 >>>11/21 vanco 11/22 >> Zosyn 11/22 >>   INTERVAL HISTORY:  Fever Agitation and tachypnea with lightening of propofol Fentanyl added 11/22 pm  VITAL SIGNS: Temp:  [99.9 F (37.7 C)-101.3 F (38.5 C)] 100.3 F (37.9 C) (11/23 0736) Pulse Rate:  [85-135] 112 (11/23 0848) Resp:  [11-35] 30 (11/23 0848) BP: (83-168)/(36-83) 149/79 mmHg (11/23 0848) SpO2:  [91 %-97 %] 94 % (11/23 0848) FiO2 (%):  [30 %] 30 % (11/23 0848)  HEMODYNAMICS:   VENTILATOR SETTINGS: Vent Mode:  [-] PRVC FiO2 (%):  [30 %] 30 % Set Rate:  [16 bmp-30 bmp] 16 bmp Vt Set:  [550 mL] 550 mL PEEP:  [5 cmH20] 5 cmH20 Plateau Pressure:  [21 cmH20-24 cmH20] 21 cmH20  INTAKE / OUTPUT: Intake/Output     11/22 0701 - 11/23 0700 11/23 0701 - 11/24 0700   I.V. (mL/kg) 999.6 (10.2) 26 (0.3)   NG/GT 1500 50   IV Piggyback 587.5    Total Intake(mL/kg)  3087.1 (31.4) 76 (0.8)   Urine (mL/kg/hr) 2610 (1.1) 150 (0.4)   Total Output 2610 150   Net +477.1 -74        Urine Occurrence  1 x   Stool Occurrence 3 x     PHYSICAL EXAMINATION:  General:  Appears acutely ill, mechanically ventilated Neuro:  Sedated, encephalopathic, RASS-2 to -3, follows commands HEENT:  OETT Cardiovascular: Regular rate and rhythm ; right DP pulse not palpable, left trace Lungs:  Bilateral air entry, no w/r/r Abdomen:  Soft, nontender, hyperactive BS, mildly distended Musculoskeletal:  Moves all extremities, 1+ LE edema at ankles bilaterally Skin:  Intact  LABS:  CBC  Recent Labs Lab 12/20/12 0950 12/21/12 0500 12/22/12 0418  WBC 18.3* 17.1* 11.8*  HGB 11.0* 9.3* 8.2*  HCT 31.8* 27.6* 23.8*  PLT 285 370 327   Coag's  Recent Labs Lab 12/18/12 2210  INR 0.93   BMET  Recent Labs Lab 12/21/12 0500 12/21/12 1205 12/22/12 0418  NA 145 145 149*  K 4.1 4.4 3.7  CL 115* 116* 119*  CO2 19 20 21   BUN 23 24* 31*  CREATININE 1.51* 1.54* 1.68*  GLUCOSE 239* 290* 152*   Electrolytes  Recent Labs Lab 12/18/12 0541  12/21/12 0500 12/21/12 1205 12/22/12 0418  CALCIUM 8.3*  < > 8.1* 8.0* 8.1*  MG 2.2  --   --   --   --   PHOS 3.2  --   --   --   --   < > =  values in this interval not displayed. Sepsis Markers  Recent Labs Lab 12/18/12 2217  LATICACIDVEN 1.88   ABG  Recent Labs Lab 12/18/12 2228 12/18/12 2346  PHART 7.390 7.233*  PCO2ART 38.8 53.3*  PO2ART 469.0* 367.0*   Liver Enzymes  Recent Labs Lab 12/18/12 2204  AST 37  ALT 44  ALKPHOS 77  BILITOT 0.3  ALBUMIN 2.3*   Cardiac Enzymes  Recent Labs Lab 12/18/12 2151  PROBNP 342.7*   Glucose  Recent Labs Lab 12/22/12 0204 12/22/12 0300 12/22/12 0407 12/22/12 0514 12/22/12 0632 12/22/12 0729  GLUCAP 177* 152* 147* 141* 139* 159*   CXR:  11/23>> B alveolar infiltrates / atx improved   ASSESSMENT / PLAN:  PULMONARY A:  Acute respiratory failure in  setting of bilateral pulmonary embolism.   Recent Enterobacter aerogenes pneumonia. Suspected new HCAP TTE with no right heart strain or elevated P pressure P:   Full mechanical support Daily SBT, WUA; note high Ve now and on previous hospitalization.  Ventilator bundle Anticoagulation as below  CARDIOVASCULAR A: Hypertension B PE P:  Goal MAP>60 (43-124) Preadmission Labetalol / lipitor, ASA   RENAL A:  Acute renal failure, non-oliguric; suspect ATN following transient shock; CVP 10 - renal US negative P:   Follow CVP, goal > 10 Trend BMP,  NS@kvo   GASTROINTESTINAL A:  No issues. P:   NPO TF started 11/21 Protonix for GI Px  HEMATOLOGIC A:  Acute pulmonary embolism. Anemia, likely dilutional with no active bleed P:  Trend CBC / APTT Continue heparin per Pharmacy  INFECTIOUS A:  Recovering from aspiration / enterobacter pneumonia.  Completed tx 11/21 with 14 d abx for enterobacter PNA  Consider new HCAP, consider pulm infarct P:   F/u cultures  Central line placed, use for blood draws with PIVs not functional for draws Start vanco + zosyn 11/22  ENDOCRINE  A:  DMII.  Hyperglycemia P:   SSI, change to ICU protocol > start lantus and change to Phase 3 Hold Amaryl, Jentadueto  NEUROLOGIC A:  Acute encephalopathy. P:   Goal RASS 0 to -1 Change to precedex on 11/23 from propofol   35 minutes CC time  Levy Pupa, MD, PhD 12/22/2012, 10:47 AM [facility] [phone] or if no answer [phone]

## 2012-12-22 NOTE — Progress Notes (Addendum)
Wasted fentanyl  In sink with Rosey Bath, RN after pt extubation.

## 2012-12-22 NOTE — Procedures (Signed)
Extubation Procedure Note  Patient Details:   Name: Juan Glenn DOB: 01/14/1956 MRN: [id number]   Airway Documentation:  Called to bedside by unit RT d/t ? OET cuff leak.  OET in good position, checked OET cuff pressure.  Cuff loosing air every 30 seconds, pt loosing 200-300 ml VT on exhaled VT, noted large air leak around throat, and pt able to speak w/ OET in.  Placed at 3 way stop cock on the pilot balloon to ensure the pilot balloon was not faulty.   MD called immediately.  Dr. Delton Coombes at bedside to eval pt.  Per MD, extubate pt.   Pt is awake and alert, follows commands.  No distress noted, although pt did appear uncomfortable w/ air leaking around cuff.    Evaluation  O2 sats: stable throughout Complications: No apparent complications Patient did tolerate procedure well. Bilateral Breath Sounds: Clear, coarse t/o.  Suctioning: Airway Yes, pt able to speak. Pt placed on 6 lpm [place], sats 91-93%, pt denies SOB, no resp distress noted.  No stridor noted. MD and RN at bedside during extubation.   Jennette Kettle 12/22/2012, 3:59 PM

## 2012-12-22 NOTE — Progress Notes (Signed)
eLink Physician-Brief Progress Note Patient Name: Juan Glenn DOB: 25-Sep-1955 MRN: [id number]  Date of Service  12/22/2012   HPI/Events of Note     eICU Interventions  Ok to come off insulin gtt - use phase 3 wuith 20 u lantus Multiple active insulin orders -reviewed & dc'd   Intervention Category Intermediate Interventions: Hyperglycemia - evaluation and treatment  Pavielle Biggar V. 12/22/2012, 6:16 PM

## 2012-12-22 NOTE — Progress Notes (Signed)
eLink Physician-Brief Progress Note Patient Name: Juan Glenn DOB: [birth year] MRN: [id number]  Date of Service  12/22/2012   HPI/Events of Note  Patient extubated today unable to take large pills.  Now with hypertension on oral labetalol.  Current BP of 153/86 (102)   eICU Interventions  Plan: For tonight will use prn labetalol 10 mg IV q2 hours systolic BP greater or equal to 160 mmHg.  Re-evaluate ability to swallow in AM re oral meds.   Intervention Category Minor Interventions: Routine modifications to care plan (e.g. PRN medications for pain, fever)  Vanden Fawaz 12/22/2012, 11:35 PM

## 2012-12-23 LAB — CBC
HCT: 29.1 % — ABNORMAL LOW (ref 39.0–52.0)
MCH: 28.9 pg (ref 26.0–34.0)
MCV: 87.7 fL (ref 78.0–100.0)
Platelets: 366 10*3/uL (ref 150–400)
RBC: 3.32 MIL/uL — ABNORMAL LOW (ref 4.22–5.81)
RDW: 14.7 % (ref 11.5–15.5)
WBC: 13.1 10*3/uL — ABNORMAL HIGH (ref 4.0–10.5)

## 2012-12-23 LAB — GLUCOSE, CAPILLARY
Glucose-Capillary: 141 mg/dL — ABNORMAL HIGH (ref 70–99)
Glucose-Capillary: 159 mg/dL — ABNORMAL HIGH (ref 70–99)
Glucose-Capillary: 169 mg/dL — ABNORMAL HIGH (ref 70–99)
Glucose-Capillary: 186 mg/dL — ABNORMAL HIGH (ref 70–99)
Glucose-Capillary: 190 mg/dL — ABNORMAL HIGH (ref 70–99)
Glucose-Capillary: 299 mg/dL — ABNORMAL HIGH (ref 70–99)
Glucose-Capillary: 332 mg/dL — ABNORMAL HIGH (ref 70–99)

## 2012-12-23 LAB — URINE CULTURE

## 2012-12-23 LAB — BASIC METABOLIC PANEL
BUN: 25 mg/dL — ABNORMAL HIGH (ref 6–23)
Chloride: 118 mEq/L — ABNORMAL HIGH (ref 96–112)
Creatinine, Ser: 1.65 mg/dL — ABNORMAL HIGH (ref 0.50–1.35)
GFR calc non Af Amer: 44 mL/min — ABNORMAL LOW (ref >90)
Glucose, Bld: 146 mg/dL — ABNORMAL HIGH (ref 70–99)
Potassium: 3.6 mEq/L (ref 3.5–5.1)

## 2012-12-23 MED ORDER — INSULIN ASPART 100 UNIT/ML ~~LOC~~ SOLN: 2.0000 [IU] | Freq: Three times a day (TID) | SUBCUTANEOUS | Status: DC

## 2012-12-23 MED ORDER — INSULIN GLARGINE 100 UNIT/ML ~~LOC~~ SOLN
10.0000 [IU] | Freq: Once | SUBCUTANEOUS | Status: AC
  Administered 2012-12-23: 10 [IU] via SUBCUTANEOUS
  Filled 2012-12-23: qty 0.1

## 2012-12-23 MED ORDER — PANTOPRAZOLE SODIUM 40 MG PO TBEC
40.0000 mg | DELAYED_RELEASE_TABLET | Freq: Every day | ORAL | Status: DC
  Administered 2012-12-23 – 2013-01-01 (×10): 40 mg via ORAL
  Filled 2012-12-23 (×10): qty 1

## 2012-12-23 MED ORDER — FREE WATER: 200.0000 mL | Status: DC

## 2012-12-23 MED ORDER — LABETALOL HCL 300 MG PO TABS
300.0000 mg | ORAL_TABLET | Freq: Three times a day (TID) | ORAL | Status: DC
  Administered 2012-12-23 – 2012-12-27 (×14): 300 mg via ORAL
  Filled 2012-12-23 (×18): qty 1

## 2012-12-23 MED ORDER — INSULIN ASPART 100 UNIT/ML ~~LOC~~ SOLN
0.0000 [IU] | Freq: Three times a day (TID) | SUBCUTANEOUS | Status: DC
  Administered 2012-12-24: 20 [IU] via SUBCUTANEOUS
  Administered 2012-12-24: 7 [IU] via SUBCUTANEOUS
  Administered 2012-12-25: 4 [IU] via SUBCUTANEOUS
  Administered 2012-12-25: 11 [IU] via SUBCUTANEOUS

## 2012-12-23 MED ORDER — INSULIN GLARGINE 100 UNIT/ML ~~LOC~~ SOLN: 30.0000 [IU] | SUBCUTANEOUS | Status: DC

## 2012-12-23 MED ORDER — ENSURE PUDDING PO PUDG
1.0000 | Freq: Three times a day (TID) | ORAL | Status: DC
  Administered 2012-12-23 – 2013-01-01 (×26): 1 via ORAL
  Filled 2012-12-23: qty 1

## 2012-12-23 MED ORDER — INSULIN ASPART 100 UNIT/ML ~~LOC~~ SOLN
10.0000 [IU] | Freq: Once | SUBCUTANEOUS | Status: AC
  Administered 2012-12-23: 10 [IU] via SUBCUTANEOUS

## 2012-12-23 NOTE — Progress Notes (Signed)
Offered emotional and spiritual support to patient and patient's wife. Pt's wife said she is commuting from [place]. They both said they have no present emotional or spiritual needs. Explained that chaplains may be requested through pt's medical team. Pt and wife were grateful for support.   Maurene Capes, [place] [phone]

## 2012-12-23 NOTE — Progress Notes (Signed)
UR Completed.  Loden Laurent Jane [phone] 12/23/2012  

## 2012-12-23 NOTE — Evaluation (Signed)
Clinical/Bedside Swallow Evaluation Patient Details  Name: Juan Glenn MRN: [id number] Date of Birth: November 21, 1955  Today's Date: 12/23/2012 Time: 4098-1191 SLP Time Calculation (min): 27 min  Past Medical History:  Past Medical History  Diagnosis Date  . Diabetes mellitus without complication 2009  . Renal disorder   . Hyperlipidemia   . Difficult intubation    Past Surgical History:  Past Surgical History  Procedure Laterality Date  . No surgical history  09/2012   HPI:  57 yo discharged 11/19 after being treated for Enterobacter aerogenes pneumonia complicated by VDRF, returned to [facility] ED same day in respiratory distress.  Intubated. CTA chest revealed large PE.  Extubated 11/23.  Dx resp failure, acute renal failure, encephalopathy.      Assessment / Plan / Recommendation Clinical Impression  Pt presents with an acute reversible dysphagia marked by probable inadequate laryngeal closure s/p prolonged intubation and compounded by decreased MS and high RR.  Pt presented with consistent s/s of aspiration after swallowing all liquid consistencies.  For today, recommend allowing meds whole with puree.  Otherwise, rec holding other POs until clinical improvements are observed.  SLP will return next date to assess readiness to resume diet.  Educated pt/wife re: nature of temporary swallowing difficulty s/p intubation and impact of high RR on swallow dysfunction.      Aspiration Risk  Moderate    Diet Recommendation NPO except meds   Medication Administration: Whole meds with puree Compensations: Slow rate;Small sips/bites Postural Changes and/or Swallow Maneuvers: Seated upright 90 degrees    Other  Recommendations Oral Care Recommendations: Oral care Q4 per protocol   Follow Up Recommendations   (tba)    Frequency and Duration min 2x/week  2 weeks   Pertinent Vitals/Pain No c/o pain    SLP Swallow Goals   See care plan  Swallow Study Prior Functional Status       General Date of Onset: 12/18/12 H Type of Study: Bedside swallow evaluation Previous Swallow Assessment: none Diet Prior to this Study: NPO Temperature Spikes Noted: No Respiratory Status: Nasal cannula (6 L) History of Recent Intubation: Yes Length of Intubations (days): 4 days Date extubated: 12/22/12 Behavior/Cognition: Alert;Confused;Decreased sustained attention Oral Cavity - Dentition: Adequate natural dentition Self-Feeding Abilities: Needs assist Patient Positioning: Upright in chair Baseline Vocal Quality: Clear;Low vocal intensity Volitional Cough: Weak Volitional Swallow: Able to elicit    Oral/Motor/Sensory Function Overall Oral Motor/Sensory Function: Appears within functional limits for tasks assessed   Ice Chips Ice chips: Impaired Presentation: Spoon Pharyngeal Phase Impairments: Throat Clearing - Immediate;Cough - Immediate   Thin Liquid Thin Liquid: Impaired Presentation: Spoon Pharyngeal  Phase Impairments: Wet Vocal Quality;Multiple swallows;Throat Clearing - Immediate;Cough - Immediate    Nectar Thick Nectar Thick Liquid: Impaired Presentation: Spoon Pharyngeal Phase Impairments: Throat Clearing - Immediate;Cough - Immediate;Multiple swallows   Honey Thick Honey Thick Liquid: Impaired Presentation: Spoon Pharyngeal Phase Impairments: Throat Clearing - Immediate;Cough - Immediate   Puree Puree: Within functional limits Presentation: Spoon   Solid  Haroun Cotham L. [place], [place] CCC/SLP Pager [phone]     Solid: Not tested       Blenda Mounts Laurice 12/23/2012,10:20 AM

## 2012-12-23 NOTE — Progress Notes (Signed)
eLink Physician-Brief Progress Note Patient Name: Juan Glenn DOB: 1955-08-21 MRN: [id number]  Date of Service  12/23/2012   HPI/Events of Note   hyperglycemia  eICU Interventions  Increase lantus dose Change glucose checks and ssi to AC/QHS   Intervention Category Intermediate Interventions: Hyperglycemia - evaluation and treatment  [name] 12/23/2012, 8:12 PM

## 2012-12-23 NOTE — Progress Notes (Signed)
NUTRITION FOLLOW UP  Intervention:    Ensure Pudding po TID to maximize oral intake, each supplement provides 170 kcal and 4 grams of protein.   Nutrition Dx:   Inadequate oral intake related to dysphagia as evidenced by diet just advanced; ongoing.    New Goal:  Intake to meet >90% of estimated nutrition needs.  Monitor:  PO intake, swallowing function, diet advancement, weight trend, labs.  Assessment:   Patient was discharged on 11/19 after being treated for Enterobacter aerogenes pneumonia complicated by VDRF; returned to [facility] ED same day in respiratory distress. Intubated. CTA chest revealed large PE.   Patient was extubated on 11/23. S/P swallow evaluation with SLP; diet advanced to Dysphagia 1 with pudding thick liquids. Suspect PO intake will be poor due to restrictive diet consistency.  Height: Ht Readings from Last 1 Encounters:  12/19/12 5\' 8"  (1.727 m)    Weight Status:   Wt Readings from Last 1 Encounters:  12/23/12 210 lb 8.6 oz (95.5 kg)  12/20/12  210 lb 1.6 oz (95.3 kg)  Admission weight 207 lb (94 kg) Usual weight 209 lb   Re-estimated needs:  Kcal: 2000 Protein: 100-120 grams Fluid: > 2 L/day  Skin: dry skin over area of abraised site on side of left pinky finger  Diet Order: Dysphagia 1 with pudding thick liquids   Intake/Output Summary (Last 24 hours) at 12/23/12 1440 Last data filed at 12/23/12 1400  Gross per 24 hour  Intake 1240.8 ml  Output   2500 ml  Net -1259.2 ml    Last BM: 11/24   Labs:   Recent Labs Lab 12/18/12 0541  12/21/12 1205 12/22/12 0418 12/23/12 0500  NA 146*  < > 145 149* 152*  K 3.6  < > 4.4 3.7 3.6  CL 113*  < > 116* 119* 118*  CO2 21  < > 20 21 20   BUN 16  < > 24* 31* 25*  CREATININE 1.20  < > 1.54* 1.68* 1.65*  CALCIUM 8.3*  < > 8.0* 8.1* 8.7  MG 2.2  --   --   --  2.4  PHOS 3.2  --   --   --  3.8  GLUCOSE 181*  < > 290* 152* 146*  < > = values in this interval not displayed.  CBG (last 3)   Recent  Labs  12/23/12 0347 12/23/12 0802 12/23/12 1200  GLUCAP 141* 169* 186*   Lab Results  Component Value Date   HGBA1C 7.1* 09/06/2012   Scheduled Meds: . aspirin  81 mg Oral Daily  . atorvastatin  20 mg Oral q1800  . insulin aspart  2-6 Units Subcutaneous Q4H  . insulin glargine  20 Units Subcutaneous Q24H  . labetalol  300 mg Oral TID  . pantoprazole  40 mg Oral Daily  . piperacillin-tazobactam (ZOSYN)  IV  3.375 g Intravenous Q8H  . vancomycin  750 mg Intravenous Q12H    Continuous Infusions: . sodium chloride 20 mL/hr (12/19/12 1335)  . heparin 2,600 Units/hr (12/23/12 0845)  . insulin (NOVOLIN-R) infusion Stopped (12/22/12 2000)    Joaquin Courts, RD, LDN, CNSC Pager [phone] After Hours Pager [phone]

## 2012-12-23 NOTE — Progress Notes (Signed)
ANTICOAGULATION CONSULT NOTE - Follow Up Consult  Pharmacy Consult for heparin gtt Indication: bilateral pulmonary embolus, and bilateral lower extremity DVTs   No Known Allergies  Patient Measurements: Height: 5\' 8"  (172.7 cm) Weight: 210 lb 8.6 oz (95.5 kg) IBW/kg (Calculated) : 68.4 Heparin Dosing Weight: 88.5 kg  Vital Signs: Temp: 98 F (36.7 C) (11/24 1200) Temp src: Oral (11/24 1200) BP: 170/88 mmHg (11/24 1000) Pulse Rate: 103 (11/24 1000)  Labs:  Recent Labs  12/21/12 0500  12/21/12 1205  12/22/12 0418 12/22/12 1135 12/23/12 0500  HGB 9.3*  --   --   --  8.2*  --  9.6*  HCT 27.6*  --   --   --  23.8*  --  29.1*  PLT 370  --   --   --  327  --  366  HEPARINUNFRC <0.10*  < >  --   < > 0.44 0.52 0.47  CREATININE 1.51*  --  1.54*  --  1.68*  --  1.65*  < > = values in this interval not displayed.  Estimated Creatinine Clearance: 54.7 ml/min (by C-G formula based on Cr of 1.65).   Medications:  Scheduled:  . aspirin  81 mg Oral Daily  . atorvastatin  20 mg Oral q1800  . insulin aspart  2-6 Units Subcutaneous Q4H  . insulin glargine  20 Units Subcutaneous Q24H  . labetalol  300 mg Oral TID  . pantoprazole  40 mg Oral Daily  . piperacillin-tazobactam (ZOSYN)  IV  3.375 g Intravenous Q8H  . vancomycin  750 mg Intravenous Q12H   Infusions:  . sodium chloride 20 mL/hr (12/19/12 1335)  . heparin 2,600 Units/hr (12/23/12 0845)  . insulin (NOVOLIN-R) infusion Stopped (12/22/12 2000)    Assessment: 57 yo M discharged 11/19 after being treated for Enterobacter aerogenes pneumonia complicated by VDRF, returned to [facility] ED same day in respiratory distress. Patient was intubated and CTA chest revealed PE. Subsequent lower extremity dopplers also revealed bilateral LE DVTs.   Patient's morning heparin level has returned therapeutic at 0.47.  H/H is low but trending up and plt remain wnl.  Kidney function looks to be stable with SCr ~1.6 and estimated CrCl ~55.  No  bleeding issues have been reported.  Goal of Therapy:  Heparin level 0.3-0.7 units/ml Monitor platelets by anticoagulation protocol: Yes   Plan:  - continue heparin gtt @ 2600 units/hr - daily hep lvl and CBC - monitor for s/s of bleeding  Harrold Donath E. Achilles Dunk, PharmD Clinical Pharmacist - Resident Pager: [phone] Pharmacy: [phone] 12/23/2012 1:00 PM

## 2012-12-23 NOTE — Progress Notes (Signed)
PULMONARY  / CRITICAL CARE MEDICINE  Name: Juan Glenn MRN: [id number] DOB: 14-May-1955    ADMISSION DATE:  12/18/2012 CONSULTATION DATE:  12/18/2012  REFERRING MD :  EDP PRIMARY SERVICE:  PCCM  CHIEF COMPLAINT:  Acute respiratory failure  BRIEF PATIENT DESCRIPTION: 57 yo discharged 11/19 after being treated for Enterobacter aerogenes pneumonia complicated by VDRF, returned to [facility] ED same day in respiratory distress.  Intubated. CTA chest revealed large PE.  SIGNIFICANT EVENTS / STUDIES:  11/19  Respiratory distress. Intubated. 11/19  CTA chest >>> Bilateral PE, multifocal pneumonia 11/20 duplex > acute B LE DVT 11/21 echo done > EF 55-60, no right heart strain noted 11/21 with continued fevers, recultured.  11/21 1L bolus with hypotension>>improved 11/22 Renal US >> negative 11/23 Extubated  LINES / TUBES: OETT 11/19 >>>11/23 OGT 11/19 >>>11/23 Foley 11/19 >>> R IJ CVC 11/22>>>  CULTURES: 11/19 Blood >>> 11/19 Urine >>>no growth final 11/21 blood culture>>> 11/21 urine culture>>> no growth final 11/21 respiratory culture>>>few candida albicans final  ANTIBIOTICS: Cefepime 11/19 x 1 Vancomycin 11/19 x 1 Levaquin 11/19 >>>11/21 vanco 11/22 >> Zosyn 11/22 >>   INTERVAL HISTORY: Afebrile over 24 hours.  Awake, doing well, seated in chair.  VITAL SIGNS: Temp:  [98.2 F (36.8 C)-99.3 F (37.4 C)] 98.2 F (36.8 C) (11/24 0411) Pulse Rate:  [86-121] 98 (11/24 0600) Resp:  [21-42] 28 (11/24 0600) BP: (101-175)/(61-98) 159/89 mmHg (11/24 0600) SpO2:  [88 %-100 %] 95 % (11/24 0600) FiO2 (%):  [30 %] 30 % (11/23 1450) Weight:  [210 lb 8.6 oz (95.5 kg)] 210 lb 8.6 oz (95.5 kg) (11/24 0430)  HEMODYNAMICS:   VENTILATOR SETTINGS: Vent Mode:  [-] PRVC FiO2 (%):  [30 %] 30 % Set Rate:  [16 bmp] 16 bmp Vt Set:  [550 mL] 550 mL PEEP:  [5 cmH20] 5 cmH20 Pressure Support:  [10 cmH20] 10 cmH20 Plateau Pressure:  [12 cmH20-21 cmH20] 12 cmH20  INTAKE /  OUTPUT: Intake/Output     11/23 0701 - 11/24 0700 11/24 0701 - 11/25 0700   I.V. (mL/kg) 980.4 (10.3)    NG/GT 600    IV Piggyback 621.5    Total Intake(mL/kg) 2201.9 (23.1)    Urine (mL/kg/hr) 3225 (1.4)    Total Output 3225     Net -1023.1          Urine Occurrence 1 x     PHYSICAL EXAMINATION:  General:  Awake, appears tired but seated in chair, [place] in place Neuro:  RASS 0, follows commands HEENT: AT/[place], [place] in place Cardiovascular: Regular rate and rhythm Lungs:  Bilateral air entry, no w/r/r Abdomen:  Soft, nontender, hyperactive BS, mildly distended Musculoskeletal:  Moves all extremities, trace LE edema bilaterally, symmetric Skin:  Intact  LABS:  CBC  Recent Labs Lab 12/21/12 0500 12/22/12 0418 12/23/12 0500  WBC 17.1* 11.8* 13.1*  HGB 9.3* 8.2* 9.6*  HCT 27.6* 23.8* 29.1*  PLT 370 327 366   Coag's  Recent Labs Lab 12/18/12 2210  INR 0.93   BMET  Recent Labs Lab 12/21/12 1205 12/22/12 0418 12/23/12 0500  NA 145 149* 152*  K 4.4 3.7 3.6  CL 116* 119* 118*  CO2 20 21 20   BUN 24* 31* 25*  CREATININE 1.54* 1.68* 1.65*  GLUCOSE 290* 152* 146*   Electrolytes  Recent Labs Lab 12/18/12 0541  12/21/12 1205 12/22/12 0418 12/23/12 0500  CALCIUM 8.3*  < > 8.0* 8.1* 8.7  MG 2.2  --   --   --  2.4  PHOS 3.2  --   --   --  3.8  < > = values in this interval not displayed. Sepsis Markers  Recent Labs Lab 12/18/12 2217  LATICACIDVEN 1.88   ABG  Recent Labs Lab 12/18/12 2228 12/18/12 2346  PHART 7.390 7.233*  PCO2ART 38.8 53.3*  PO2ART 469.0* 367.0*   Liver Enzymes  Recent Labs Lab 12/18/12 2204  AST 37  ALT 44  ALKPHOS 77  BILITOT 0.3  ALBUMIN 2.3*   Cardiac Enzymes  Recent Labs Lab 12/18/12 2151  PROBNP 342.7*   Glucose  Recent Labs Lab 12/22/12 1749 12/22/12 1843 12/22/12 1946 12/22/12 2056 12/22/12 2358 12/23/12 0347  GLUCAP 106* 112* 127* 140* 159* 141*   CXR:  11/24 Improved aeration bilaterally, some  residual atelectasis/infiltrate bilateral bases  ASSESSMENT / PLAN:  PULMONARY A:  Acute respiratory failure in setting of bilateral pulmonary embolism.   Recent Enterobacter aerogenes pneumonia. Suspected new HCAP TTE with no right heart strain or elevated P pressure P:   - Extubated 11/23. - Anticoagulation as below. - Abx per ID section.  CARDIOVASCULAR A: Hypertension B PE P:  - Goal MAP>60 (104). - Preadmission Labetalol / lipitor, ASA.  - Need to eval swallow for PO meds. - Increase labetalol 300 TID.  RENAL A:  Acute renal failure, non-oliguric; suspect ATN following transient shock; CVP 10 Renal US negative Hypernatremic P:   - Follow CVP, goal > 10. - Trend BMP. - NS@kvo . - Free water stopped now that is extubated.  GASTROINTESTINAL A:  No issues. P:   - NPO. - TF started 11/21. - Protonix for GI Px. - SLP today for swallow eval.  HEMATOLOGIC A:  Acute pulmonary embolism. Anemia, likely dilutional with no active bleed P:  - Trend CBC / APTT. - Continue heparin per Pharmacy. - Consider RLE duplex with nonpalpable pulse.  INFECTIOUS A:  Recovering from aspiration / enterobacter pneumonia.  Completed tx 11/21 with 14 d abx for enterobacter PNA  Consider new HCAP, consider pulm infarct P:   - F/u cultures.  - Continue vanc/zosyn started 11/22. - Consider stopping abx if no growth 72 hours.  ENDOCRINE  A:  DMII.  Hyperglycemia P:   - Started lantus and changed to phase 3 SSI. - Hold Amaryl, Jentadueto  NEUROLOGIC A:  Acute encephalopathy. P:   - Goal RASS 0 to -1 - Change to precedex on 11/23 from propofol  Leona Singleton, MD [facility] PGY2  Advance activity as tolerated, swallow evaluation, transfer to SDU and to [facility], PCCM will sign off, please call back if needed.  Will need anticoag for PE (switch to coumadin when able to take PO), if no growth in culture by AM would d/c abx.  Patient seen and examined, agree with above  note.  I dictated the care and orders written for this patient under my direction.  Alyson Reedy, MD [phone]

## 2012-12-24 ENCOUNTER — Inpatient Hospital Stay (HOSPITAL_COMMUNITY): Payer: BC Managed Care – PPO

## 2012-12-24 DIAGNOSIS — J690 Pneumonitis due to inhalation of food and vomit: Secondary | ICD-10-CM | POA: Diagnosis present

## 2012-12-24 LAB — CBC
HCT: 28.3 % — ABNORMAL LOW (ref 39.0–52.0)
Hemoglobin: 9.5 g/dL — ABNORMAL LOW (ref 13.0–17.0)
MCH: 29.4 pg (ref 26.0–34.0)
MCHC: 33.6 g/dL (ref 30.0–36.0)
MCV: 87.6 fL (ref 78.0–100.0)
RDW: 14.5 % (ref 11.5–15.5)
WBC: 12 10*3/uL — ABNORMAL HIGH (ref 4.0–10.5)

## 2012-12-24 LAB — HEMOGLOBIN A1C: Hgb A1c MFr Bld: 8 % — ABNORMAL HIGH (ref <5.7)

## 2012-12-24 LAB — BASIC METABOLIC PANEL
BUN: 22 mg/dL (ref 6–23)
CO2: 24 mEq/L (ref 19–32)
Calcium: 8.8 mg/dL (ref 8.4–10.5)
Creatinine, Ser: 1.59 mg/dL — ABNORMAL HIGH (ref 0.50–1.35)
GFR calc Af Amer: 54 mL/min — ABNORMAL LOW (ref >90)
GFR calc non Af Amer: 46 mL/min — ABNORMAL LOW (ref >90)
Glucose, Bld: 259 mg/dL — ABNORMAL HIGH (ref 70–99)

## 2012-12-24 LAB — CULTURE, RESPIRATORY W GRAM STAIN

## 2012-12-24 LAB — GLUCOSE, CAPILLARY
Glucose-Capillary: 278 mg/dL — ABNORMAL HIGH (ref 70–99)
Glucose-Capillary: 379 mg/dL — ABNORMAL HIGH (ref 70–99)

## 2012-12-24 LAB — HEPARIN LEVEL (UNFRACTIONATED): Heparin Unfractionated: 0.48 IU/mL (ref 0.30–0.70)

## 2012-12-24 LAB — MAGNESIUM: Magnesium: 2.4 mg/dL (ref 1.5–2.5)

## 2012-12-24 LAB — PHOSPHORUS: Phosphorus: 2.7 mg/dL (ref 2.3–4.6)

## 2012-12-24 MED ORDER — HALOPERIDOL LACTATE 5 MG/ML IJ SOLN
5.0000 mg | Freq: Once | INTRAMUSCULAR | Status: AC
  Administered 2012-12-24: 5 mg via INTRAMUSCULAR
  Filled 2012-12-24: qty 1

## 2012-12-24 MED ORDER — RESOURCE THICKENUP CLEAR PO POWD
ORAL | Status: DC | PRN
  Filled 2012-12-24 (×3): qty 125

## 2012-12-24 MED ORDER — STARCH (THICKENING) PO POWD: ORAL | Status: DC | PRN

## 2012-12-24 MED ORDER — RIVAROXABAN 15 MG PO TABS
15.0000 mg | ORAL_TABLET | Freq: Two times a day (BID) | ORAL | Status: DC
  Administered 2012-12-24 – 2013-01-01 (×16): 15 mg via ORAL
  Filled 2012-12-24 (×19): qty 1

## 2012-12-24 MED ORDER — RIVAROXABAN 20 MG PO TABS: 20.0000 mg | ORAL_TABLET | Freq: Every day | ORAL | Status: DC

## 2012-12-24 MED ORDER — RIVAROXABAN 20 MG PO TABS
20.0000 mg | ORAL_TABLET | Freq: Every day | ORAL | Status: DC
  Filled 2012-12-24: qty 1

## 2012-12-24 NOTE — Progress Notes (Signed)
[facility] Progress Note [place] TEAM 1 - Stepdown/ICU TEAM   Juan Glenn ZHY:[id number] DOB: 04/24/1955 DOA: 12/18/2012 PCP: RECORD,CHARLES  Brief narrative: 57 y/o with h/o DM, CKD 3 admitted on 11/6 with seizure due to PRESS, diagnosed with aspiration pneumonia and discharged on Levaquin. Pt returned to the hospital the night after discharge with respiratory failure and was found to have b/l multifocal pulmonary emboli. Has righted sided DVT. He was intubated on admission and extubated on 11/23.   SIGNIFICANT EVENTS / STUDIES:  11/19 Respiratory distress. Intubated.  11/19 CTA chest >>> Bilateral PE, multifocal pneumonia  11/20 duplex > acute B LE DVT  11/21 echo done > EF 55-60, no right heart strain noted  11/21 with continued fevers, recultured.  11/21 1L bolus with hypotension>>improved  11/22 Renal US >> negative  11/23 Extubated   Assessment/Plan: Principal Problem:   Acute respiratory failure - due to pulm emboli superimposed on pneumonia - currently on 4 L of O2 - now right chest reveals increasing airspace disease and pleural fluid   Active Problems:    Aspiration pneumonia - cont VAnc and Zosyn for HCAP    Pulmonary embolism - currently on IV Heparin - will start Xarelto now that he has passed his swallow eval    DM (diabetes mellitus), type 2 with renal complications  - cont sliding scale - resume Amaryl when eating appropriately- also on Jentadueto at home    Convulsions/seizures - in setting of hypertensive emergency  HTN - has been admitted for PRESS in the recent past - currently stable on labetalol    CKD (chronic kidney disease) stage 3, GFR 30-59 ml/min  Hypernatremia - due to diuresis per PCCM notes- cont to follow - now able to tolerate orals  Code Status: full code Family Communication: wife in room Disposition Plan: sdu  Consultants: PCCM   Antibiotics: ANTIBIOTICS:  Cefepime 11/19 x 1  Vancomycin 11/19 x 1   Levaquin 11/19 >>>11/21  vanco 11/22 >>  Zosyn 11/22 >>    DVT prophylaxis: Heparin infusion  HPI/Subjective: Pt alert. No dyspnea or cough. Overall is feeling better.    Objective: Blood pressure 127/78, pulse 98, temperature 98.2 F (36.8 C), temperature source Oral, resp. rate 30, height 5\' 8"  (1.727 m), weight 95.5 kg (210 lb 8.6 oz), SpO2 95.00%.  Intake/Output Summary (Last 24 hours) at 12/24/12 1526 Last data filed at 12/24/12 0900  Gross per 24 hour  Intake  687.5 ml  Output   1950 ml  Net -1262.5 ml     Exam: General: sitting up in a chair. AAO x3, No acute respiratory distress Lungs: Clear to auscultation bilaterally without wheezes or crackles Cardiovascular: Regular rate and rhythm without murmur gallop or rub normal S1 and S2 Abdomen: Nontender, nondistended, soft, bowel sounds positive, no rebound, no ascites, no appreciable mass Extremities: No significant cyanosis, clubbing, or edema bilateral lower extremities  Data Reviewed: Basic Metabolic Panel:  Recent Labs Lab 12/18/12 0541  12/21/12 0500 12/21/12 1205 12/22/12 0418 12/23/12 0500 12/24/12 0220  NA 146*  < > 145 145 149* 152* 155*  K 3.6  < > 4.1 4.4 3.7 3.6 4.0  CL 113*  < > 115* 116* 119* 118* 120*  CO2 21  < > 19 20 21 20 24   GLUCOSE 181*  < > 239* 290* 152* 146* 259*  BUN 16  < > 23 24* 31* 25* 22  CREATININE 1.20  < > 1.51* 1.54* 1.68* 1.65* 1.59*  CALCIUM 8.3*  < >  8.1* 8.0* 8.1* 8.7 8.8  MG 2.2  --   --   --   --  2.4 2.4  PHOS 3.2  --   --   --   --  3.8 2.7  < > = values in this interval not displayed. Liver Function Tests:  Recent Labs Lab 12/18/12 2204  AST 37  ALT 44  ALKPHOS 77  BILITOT 0.3  PROT 7.1  ALBUMIN 2.3*   No results found for this basename: LIPASE, AMYLASE,  in the last 168 hours No results found for this basename: AMMONIA,  in the last 168 hours CBC:  Recent Labs Lab 12/18/12 2204 12/20/12 0950 12/21/12 0500 12/22/12 0418 12/23/12 0500  12/24/12 0220  WBC 15.4* 18.3* 17.1* 11.8* 13.1* 12.0*  NEUTROABS 11.5*  --   --   --   --   --   HGB 12.2* 11.0* 9.3* 8.2* 9.6* 9.5*  HCT 35.0* 31.8* 27.6* 23.8* 29.1* 28.3*  MCV 85.6 85.3 87.1 86.2 87.7 87.6  PLT 402* 285 370 327 366 405*   Cardiac Enzymes: No results found for this basename: CKTOTAL, CKMB, CKMBINDEX, TROPONINI,  in the last 168 hours BNP (last 3 results)  Recent Labs  12/05/12 0715 12/18/12 2151  PROBNP 85.4 342.7*   CBG:  Recent Labs Lab 12/23/12 2133 12/23/12 2354 12/24/12 0345 12/24/12 0747 12/24/12 1112  GLUCAP 332* 278* 218* 222* 246*    Recent Results (from the past 240 hour(s))  CULTURE, BLOOD (ROUTINE X 2)     Status: None   Collection Time    12/18/12 10:20 PM      Result Value Range Status   Specimen Description BLOOD LEFT ARM   Final   Special Requests BOTTLES DRAWN AEROBIC AND ANAEROBIC 9CC   Final   Culture  Setup Time     Final   Value: 12/19/2012 04:45     Performed at [facility]   Culture     Final   Value:        BLOOD CULTURE RECEIVED NO GROWTH TO DATE CULTURE WILL BE HELD FOR 5 DAYS BEFORE ISSUING A FINAL NEGATIVE REPORT     Performed at [facility]   Report Status PENDING   Incomplete  CULTURE, BLOOD (ROUTINE X 2)     Status: None   Collection Time    12/18/12 10:30 PM      Result Value Range Status   Specimen Description BLOOD RIGHT ARM   Final   Special Requests BOTTLES DRAWN AEROBIC ONLY 10CC   Final   Culture  Setup Time     Final   Value: 12/19/2012 04:44     Performed at [facility]   Culture     Final   Value:        BLOOD CULTURE RECEIVED NO GROWTH TO DATE CULTURE WILL BE HELD FOR 5 DAYS BEFORE ISSUING A FINAL NEGATIVE REPORT     Performed at [facility]   Report Status PENDING   Incomplete  URINE CULTURE     Status: None   Collection Time    12/18/12 11:15 PM      Result Value Range Status   Specimen Description URINE, CATHETERIZED   Final   Special Requests NONE    Final   Culture  Setup Time     Final   Value: 12/19/2012 05:13     Performed at [facility] Count     Final  Value: NO GROWTH     Performed at [facility]     Final   Value: NO GROWTH     Performed at [facility]   Report Status 12/20/2012 FINAL   Final  MRSA PCR SCREENING     Status: None   Collection Time    12/19/12  2:32 AM      Result Value Range Status   MRSA by PCR NEGATIVE  NEGATIVE Final   Comment:            The GeneXpert MRSA Assay (FDA     approved for NASAL specimens     only), is one component of a     comprehensive MRSA colonization     surveillance program. It is not     intended to diagnose MRSA     infection nor to guide or     monitor treatment for     MRSA infections.  CULTURE, RESPIRATORY (NON-EXPECTORATED)     Status: None   Collection Time    12/20/12  9:46 AM      Result Value Range Status   Specimen Description TRACHEAL ASPIRATE   Final   Special Requests NONE   Final   Gram Stain     Final   Value: FEW WBC PRESENT, PREDOMINANTLY PMN     NO SQUAMOUS EPITHELIAL CELLS SEEN     NO ORGANISMS SEEN     Performed at [facility]   Culture     Final   Value: FEW CANDIDA ALBICANS     Performed at [facility]   Report Status 12/22/2012 FINAL   Final  URINE CULTURE     Status: None   Collection Time    12/20/12  5:36 PM      Result Value Range Status   Specimen Description URINE, RANDOM   Final   Special Requests NONE   Final   Culture  Setup Time     Final   Value: 12/20/2012 22:06     Performed at [facility] Count     Final   Value: NO GROWTH     Performed at [facility]   Culture     Final   Value: NO GROWTH     Performed at [facility]   Report Status 12/21/2012 FINAL   Final  CULTURE, BLOOD (ROUTINE X 2)     Status: None   Collection Time    12/20/12  6:15 PM      Result Value Range Status   Specimen Description BLOOD LEFT HAND   Final    Special Requests BOTTLES DRAWN AEROBIC ONLY 2.5CC   Final   Culture  Setup Time     Final   Value: 12/21/2012 02:54     Performed at [facility]   Culture     Final   Value:        BLOOD CULTURE RECEIVED NO GROWTH TO DATE CULTURE WILL BE HELD FOR 5 DAYS BEFORE ISSUING A FINAL NEGATIVE REPORT     Performed at [facility]   Report Status PENDING   Incomplete  CULTURE, BLOOD (ROUTINE X 2)     Status: None   Collection Time    12/20/12  8:34 PM      Result Value Range Status   Specimen Description BLOOD LEFT HAND   Final   Special Requests BOTTLES DRAWN AEROBIC ONLY 7CC   Final  Culture  Setup Time     Final   Value: 12/21/2012 02:54     Performed at [facility]   Culture     Final   Value:        BLOOD CULTURE RECEIVED NO GROWTH TO DATE CULTURE WILL BE HELD FOR 5 DAYS BEFORE ISSUING A FINAL NEGATIVE REPORT     Performed at [facility]   Report Status PENDING   Incomplete  CULTURE, RESPIRATORY (NON-EXPECTORATED)     Status: None   Collection Time    12/21/12  8:32 PM      Result Value Range Status   Specimen Description TRACHEAL ASPIRATE   Final   Special Requests NONE   Final   Gram Stain     Final   Value: RARE WBC PRESENT, PREDOMINANTLY PMN     NO SQUAMOUS EPITHELIAL CELLS SEEN     NO ORGANISMS SEEN     Performed at [facility]   Culture     Final   Value: Non-Pathogenic Oropharyngeal-type Flora Isolated.     Performed at [facility]   Report Status 12/24/2012 FINAL   Final  URINE CULTURE     Status: None   Collection Time    12/21/12  8:58 PM      Result Value Range Status   Specimen Description URINE, CATHETERIZED   Final   Special Requests NONE   Final   Culture  Setup Time     Final   Value: 12/22/2012 02:05     Performed at [facility] Count     Final   Value: NO GROWTH     Performed at [facility]   Culture     Final   Value: NO GROWTH     Performed at [facility]    Report Status 12/23/2012 FINAL   Final     Studies:  Recent x-ray studies have been reviewed in detail by the Attending Physician  Scheduled Meds:  Scheduled Meds: . aspirin  81 mg Oral Daily  . atorvastatin  20 mg Oral q1800  . feeding supplement (ENSURE)  1 Container Oral TID BM  . insulin aspart  0-20 Units Subcutaneous TID WC  . labetalol  300 mg Oral TID  . pantoprazole  40 mg Oral Daily  . piperacillin-tazobactam (ZOSYN)  IV  3.375 g Intravenous Q8H  . vancomycin  750 mg Intravenous Q12H   Continuous Infusions: . sodium chloride 20 mL/hr (12/19/12 1335)  . heparin 2,400 Units/hr (12/24/12 0336)    Time spent on care of this patient: 35 min   Veva Grimley, MD  [facility] Office  [phone] Pager - Text Page per Amion as per below:  On-Call/Text Page:      [email or web address]      password [email or web address]  If 7PM-7AM, please contact night-coverage [email or web address] Password [email or web address] 12/24/2012, 3:26 PM   LOS: 6 days

## 2012-12-24 NOTE — Progress Notes (Signed)
Consider adding Lantus 15 units daily  If CBGs continue greater than 180 mg/dl.    Will continue to follow while in hospital.  Smith Mince RN BSN CDE

## 2012-12-24 NOTE — Progress Notes (Signed)
Rehab Admissions Coordinator Note:  Patient was screened by Clois Dupes for appropriateness for an Inpatient Acute Rehab Consult.  At this time, we are recommending Inpatient Rehab consult. Also recommend an OT consult which will be needed for insurance .  Clois Dupes 12/24/2012, 9:46 PM  I can be reached at [phone].

## 2012-12-24 NOTE — Progress Notes (Signed)
ANTICOAGULATION CONSULT NOTE - Follow Up Consult  Pharmacy Consult for heparin gtt Indication: bilateral pulmonary embolus, and bilateral lower extremity DVTs  No Known Allergies  Patient Measurements: Height: 5\' 8"  (172.7 cm) Weight: 210 lb 8.6 oz (95.5 kg) IBW/kg (Calculated) : 68.4 Heparin Dosing Weight: 88.5 kg  Vital Signs: Temp: 98.2 F (36.8 C) (11/25 1156) Temp src: Oral (11/25 1156) BP: 142/69 mmHg (11/25 1200) Pulse Rate: 105 (11/25 1200)  Labs:  Recent Labs  12/22/12 0418  12/23/12 0500 12/24/12 0220 12/24/12 1005  HGB 8.2*  --  9.6* 9.5*  --   HCT 23.8*  --  29.1* 28.3*  --   PLT 327  --  366 405*  --   HEPARINUNFRC 0.44  < > 0.47 0.77* 0.48  CREATININE 1.68*  --  1.65* 1.59*  --   < > = values in this interval not displayed.  Estimated Creatinine Clearance: 56.7 ml/min (by C-G formula based on Cr of 1.59).  Medications:  Scheduled:  . aspirin  81 mg Oral Daily  . atorvastatin  20 mg Oral q1800  . feeding supplement (ENSURE)  1 Container Oral TID BM  . insulin aspart  0-20 Units Subcutaneous TID WC  . labetalol  300 mg Oral TID  . pantoprazole  40 mg Oral Daily  . piperacillin-tazobactam (ZOSYN)  IV  3.375 g Intravenous Q8H  . vancomycin  750 mg Intravenous Q12H   Infusions:  . sodium chloride 20 mL/hr (12/19/12 1335)  . heparin 2,400 Units/hr (12/24/12 0336)   Assessment: 57 yo M discharged 11/19 after being treated for Enterobacter aerogenes pneumonia complicated by VDRF, returned to [facility] ED same day in respiratory distress. Patient was intubated and CTA chest revealed PE. Subsequent lower extremity dopplers also revealed bilateral LE DVTs.   Patient's morning heparin level has returned therapeutic at 0.48 (after decrease in rate last night d/t supratherapeutic lvl at 0.77).  H/H is low but stable and plt are trending up to 405.  Kidney function looks to be stable with SCr ~1.6 and estimated CrCl ~57.  No bleeding issues have been reported.  Goal  of Therapy:  Heparin level 0.3-0.7 units/ml Monitor platelets by anticoagulation protocol: Yes   Plan:  - continue heparin gtt @ 2400 units/hr - daily hep lvl and CBC - monitor for s/s of bleeding - f/u with anticoagulation plans,  - if no procedural plans, consider switch to lovenox therapeutic dosing  - regarding transition to future oral therapy, patient possible candidate for noval oral anticoagulant (such as rivaroxaban)   Shelba Flake. Achilles Dunk, PharmD Clinical Pharmacist - Resident Pager: [phone] Pharmacy: [phone] 12/24/2012 1:37 PM

## 2012-12-24 NOTE — Progress Notes (Signed)
eLink Physician-Brief Progress Note Patient Name: Juan Glenn DOB: 04-Feb-1955 MRN: [id number]  Date of Service  12/24/2012   HPI/Events of Note   Agitation Trying to get out of bed Difficult to redirect  eICU Interventions  Haldol x1   Intervention Category Minor Interventions: Agitation / anxiety - evaluation and management  Lynnett Langlinais 12/24/2012, 10:37 PM

## 2012-12-24 NOTE — Progress Notes (Signed)
ANTICOAGULATION CONSULT NOTE - Follow Up Consult  Pharmacy Consult for heparin Indication: pulmonary embolus and DVT  Labs:  Recent Labs  12/21/12 1205  12/22/12 0418 12/22/12 1135 12/23/12 0500 12/24/12 0220  HGB  --   --  8.2*  --  9.6* 9.5*  HCT  --   --  23.8*  --  29.1* 28.3*  PLT  --   --  327  --  366 405*  HEPARINUNFRC  --   < > 0.44 0.52 0.47 0.77*  CREATININE 1.54*  --  1.68*  --  1.65*  --   < > = values in this interval not displayed.   Assessment: 57yo male now slightly supratherapeutic on heparin after several levels at goal, likely now accumulating.  Goal of Therapy:  Heparin level 0.3-0.7 units/ml   Plan:  Will decrease heparin gtt by 2 units/kg/hr to 2400 units/hr and check level in 6-8hr.  Vernard Gambles, PharmD, BCPS  12/24/2012,3:32 AM

## 2012-12-24 NOTE — Progress Notes (Addendum)
Speech Language Pathology Treatment: Dysphagia  Patient Details Name: Juan Glenn MRN: [id number] DOB: 04-15-1955 Today's Date: 12/24/2012 Time: 1105-1130 SLP Time Calculation (min): 25 min  Assessment / Plan / Recommendation Clinical Impression  Dysphagia treatment focusing on ability to initiate po's. Pt. Alert, in chair with wife present.  Pt. Continuing to exhibit s/s aspiration and indicators for pharyngeal dysphagia with thin and nectar thick liquids.  Objective assessment needed for recommendation of appropriate means of nutrition and texture/consistency. Intubated approximately 5 days.  Vocal quality not hoarse and denies significant odnophagia, therefore MBS recommended and scheduled today at 1330.   HPI HPI: 57 yo discharged 11/19 after being treated for Enterobacter aerogenes pneumonia complicated by VDRF, returned to [facility] ED same day in respiratory distress.  Intubated. CTA chest revealed large PE.  Extubated 11/23.  Dx resp failure, acute renal failure, encephalopathy.      Pertinent Vitals WDL  SLP Plan  MBS    Recommendations Diet recommendations: Dysphagia 1 (puree);Pudding-thick liquid Liquids provided via: Teaspoon Medication Administration: Whole meds with puree              Oral Care Recommendations: Oral care Q4 per protocol Follow up Recommendations:  (TBD) Plan: MBS    GO     Breck Coons [employer].Ed [employer] [phone]  12/24/2012

## 2012-12-24 NOTE — Progress Notes (Signed)
ANTIBIOTIC CONSULT NOTE - Follow Up  Pharmacy Consult:  Vancomycin / Zosyn Indication:  suspected PNA  No Known Allergies  Patient Measurements: Height: 5\' 8"  (172.7 cm) Weight: 210 lb 8.6 oz (95.5 kg) IBW/kg (Calculated) : 68.4  Vital Signs: Temp: 98.2 F (36.8 C) (11/25 1156) Temp src: Oral (11/25 1156) BP: 142/69 mmHg (11/25 1200) Pulse Rate: 105 (11/25 1200) Intake/Output from previous day: 11/24 0701 - 11/25 0700 In: 1051.5 [I.V.:614; IV Piggyback:437.5] Out: 2025 [Urine:2025] Intake/Output from this shift: Total I/O In: 44 [I.V.:44] Out: 375 [Urine:375]  Labs:  Recent Labs  12/22/12 0418 12/23/12 0500 12/24/12 0220  WBC 11.8* 13.1* 12.0*  HGB 8.2* 9.6* 9.5*  PLT 327 366 405*  CREATININE 1.68* 1.65* 1.59*   Estimated Creatinine Clearance: 56.7 ml/min (by C-G formula based on Cr of 1.59).  Recent Labs  12/24/12 0909  [facility] 17.2     Microbiology: Recent Results (from the past 720 hour(s))  URINE CULTURE     Status: None   Collection Time    12/05/12 12:41 AM      Result Value Range Status   Specimen Description URINE, RANDOM   Final   Special Requests Normal   Final   Culture  Setup Time     Final   Value: 12/05/2012 02:45     Performed at [facility] Count     Final   Value: NO GROWTH     Performed at [facility]   Culture     Final   Value: NO GROWTH     Performed at [facility]   Report Status 12/06/2012 FINAL   Final  MRSA PCR SCREENING     Status: None   Collection Time    12/05/12  4:47 AM      Result Value Range Status   MRSA by PCR NEGATIVE  NEGATIVE Final   Comment:            The GeneXpert MRSA Assay (FDA     approved for NASAL specimens     only), is one component of a     comprehensive MRSA colonization     surveillance program. It is not     intended to diagnose MRSA     infection nor to guide or     monitor treatment for     MRSA infections.  CULTURE, BLOOD (ROUTINE X 2)      Status: None   Collection Time    12/05/12  5:30 AM      Result Value Range Status   Specimen Description BLOOD RIGHT HAND   Final   Special Requests BOTTLES DRAWN AEROBIC ONLY 2CC   Final   Culture  Setup Time     Final   Value: 12/05/2012 09:24     Performed at [facility]   Culture     Final   Value: NO GROWTH 5 DAYS     Performed at [facility]   Report Status 12/11/2012 FINAL   Final  CULTURE, BLOOD (ROUTINE X 2)     Status: None   Collection Time    12/05/12  5:45 AM      Result Value Range Status   Specimen Description BLOOD RIGHT HAND   Final   Special Requests BOTTLES DRAWN AEROBIC ONLY 3CC   Final   Culture  Setup Time     Final   Value: 12/05/2012 09:24     Performed at [facility]  Culture     Final   Value: NO GROWTH 5 DAYS     Performed at [facility]   Report Status 12/11/2012 FINAL   Final  URINE CULTURE     Status: None   Collection Time    12/05/12  8:44 AM      Result Value Range Status   Specimen Description URINE, CATHETERIZED   Final   Special Requests NONE   Final   Culture  Setup Time     Final   Value: 12/05/2012 16:14     Performed at [facility]   Colony Count     Final   Value: NO GROWTH     Performed at [facility]   Culture     Final   Value: NO GROWTH     Performed at [facility]   Report Status 12/06/2012 FINAL   Final  CULTURE, RESPIRATORY (NON-EXPECTORATED)     Status: None   Collection Time    12/05/12  7:49 PM      Result Value Range Status   Specimen Description TRACHEAL ASPIRATE   Final   Special Requests NONE   Final   Gram Stain     Final   Value: ABUNDANT WBC PRESENT, PREDOMINANTLY PMN     RARE SQUAMOUS EPITHELIAL CELLS PRESENT     FEW GRAM POSITIVE COCCI     IN PAIRS RARE YEAST     Performed at [facility]   Culture     Final   Value: FEW CANDIDA ALBICANS     Performed at [facility]   Report Status 12/08/2012 FINAL   Final  URINE  CULTURE     Status: None   Collection Time    12/09/12  4:57 PM      Result Value Range Status   Specimen Description URINE, CATHETERIZED   Final   Special Requests NONE   Final   Culture  Setup Time     Final   Value: 12/09/2012 17:24     Performed at [facility]   Culture     Final   Value: NO GROWTH     Performed at [facility]   Report Status 12/10/2012 FINAL   Final  CULTURE, BLOOD (ROUTINE X 2)     Status: None   Collection Time    12/09/12  7:37 PM      Result Value Range Status   Specimen Description BLOOD ARM LEFT   Final   Special Requests BOTTLES DRAWN AEROBIC ONLY 2CC   Final   Culture  Setup Time     Final   Value: 12/10/2012 01:10     Performed at [facility]   Culture     Final   Value: NO GROWTH 5 DAYS     Performed at [facility]   Report Status 12/16/2012 FINAL   Final  CULTURE, BLOOD (ROUTINE X 2)     Status: None   Collection Time    12/09/12  7:42 PM      Result Value Range Status   Specimen Description BLOOD HAND LEFT   Final   Special Requests BOTTLES DRAWN AEROBIC ONLY 3CC   Final   Culture  Setup Time     Final   Value: 12/10/2012 01:10     Performed at [facility]   Culture     Final   Value: NO GROWTH 5 DAYS     Performed  at [facility]   Report Status 12/16/2012 FINAL   Final  CULTURE, RESPIRATORY (NON-EXPECTORATED)     Status: None   Collection Time    12/09/12  8:14 PM      Result Value Range Status   Specimen Description TRACHEAL ASPIRATE   Final   Special Requests NONE   Final   Gram Stain     Final   Value: ABUNDANT WBC PRESENT,BOTH PMN AND MONONUCLEAR     RARE SQUAMOUS EPITHELIAL CELLS PRESENT     NO ORGANISMS SEEN     Performed at [facility]   Culture     Final   Value: FEW ENTEROBACTER AEROGENES     Performed at [facility]   Report Status 12/12/2012 FINAL   Final   Organism ID, Bacteria ENTEROBACTER AEROGENES   Final  CULTURE, BLOOD (ROUTINE X 2)      Status: None   Collection Time    12/18/12 10:20 PM      Result Value Range Status   Specimen Description BLOOD LEFT ARM   Final   Special Requests BOTTLES DRAWN AEROBIC AND ANAEROBIC 9CC   Final   Culture  Setup Time     Final   Value: 12/19/2012 04:45     Performed at [facility]   Culture     Final   Value:        BLOOD CULTURE RECEIVED NO GROWTH TO DATE CULTURE WILL BE HELD FOR 5 DAYS BEFORE ISSUING A FINAL NEGATIVE REPORT     Performed at [facility]   Report Status PENDING   Incomplete  CULTURE, BLOOD (ROUTINE X 2)     Status: None   Collection Time    12/18/12 10:30 PM      Result Value Range Status   Specimen Description BLOOD RIGHT ARM   Final   Special Requests BOTTLES DRAWN AEROBIC ONLY 10CC   Final   Culture  Setup Time     Final   Value: 12/19/2012 04:44     Performed at [facility]   Culture     Final   Value:        BLOOD CULTURE RECEIVED NO GROWTH TO DATE CULTURE WILL BE HELD FOR 5 DAYS BEFORE ISSUING A FINAL NEGATIVE REPORT     Performed at [facility]   Report Status PENDING   Incomplete  URINE CULTURE     Status: None   Collection Time    12/18/12 11:15 PM      Result Value Range Status   Specimen Description URINE, CATHETERIZED   Final   Special Requests NONE   Final   Culture  Setup Time     Final   Value: 12/19/2012 05:13     Performed at [facility] Count     Final   Value: NO GROWTH     Performed at [facility]   Culture     Final   Value: NO GROWTH     Performed at [facility]   Report Status 12/20/2012 FINAL   Final  MRSA PCR SCREENING     Status: None   Collection Time    12/19/12  2:32 AM      Result Value Range Status   MRSA by PCR NEGATIVE  NEGATIVE Final   Comment:            The GeneXpert MRSA Assay (FDA     approved  for NASAL specimens     only), is one component of a     comprehensive MRSA colonization     surveillance program. It is not     intended to  diagnose MRSA     infection nor to guide or     monitor treatment for     MRSA infections.  CULTURE, RESPIRATORY (NON-EXPECTORATED)     Status: None   Collection Time    12/20/12  9:46 AM      Result Value Range Status   Specimen Description TRACHEAL ASPIRATE   Final   Special Requests NONE   Final   Gram Stain     Final   Value: FEW WBC PRESENT, PREDOMINANTLY PMN     NO SQUAMOUS EPITHELIAL CELLS SEEN     NO ORGANISMS SEEN     Performed at [facility]   Culture     Final   Value: FEW CANDIDA ALBICANS     Performed at [facility]   Report Status 12/22/2012 FINAL   Final  URINE CULTURE     Status: None   Collection Time    12/20/12  5:36 PM      Result Value Range Status   Specimen Description URINE, RANDOM   Final   Special Requests NONE   Final   Culture  Setup Time     Final   Value: 12/20/2012 22:06     Performed at [facility] Count     Final   Value: NO GROWTH     Performed at [facility]   Culture     Final   Value: NO GROWTH     Performed at [facility]   Report Status 12/21/2012 FINAL   Final  CULTURE, BLOOD (ROUTINE X 2)     Status: None   Collection Time    12/20/12  6:15 PM      Result Value Range Status   Specimen Description BLOOD LEFT HAND   Final   Special Requests BOTTLES DRAWN AEROBIC ONLY 2.5CC   Final   Culture  Setup Time     Final   Value: 12/21/2012 02:54     Performed at [facility]   Culture     Final   Value:        BLOOD CULTURE RECEIVED NO GROWTH TO DATE CULTURE WILL BE HELD FOR 5 DAYS BEFORE ISSUING A FINAL NEGATIVE REPORT     Performed at [facility]   Report Status PENDING   Incomplete  CULTURE, BLOOD (ROUTINE X 2)     Status: None   Collection Time    12/20/12  8:34 PM      Result Value Range Status   Specimen Description BLOOD LEFT HAND   Final   Special Requests BOTTLES DRAWN AEROBIC ONLY [facility]   Final   Culture  Setup Time     Final   Value: 12/21/2012 02:54      Performed at [facility]   Culture     Final   Value:        BLOOD CULTURE RECEIVED NO GROWTH TO DATE CULTURE WILL BE HELD FOR 5 DAYS BEFORE ISSUING A FINAL NEGATIVE REPORT     Performed at [facility]   Report Status PENDING   Incomplete  CULTURE, RESPIRATORY (NON-EXPECTORATED)     Status: None   Collection Time    12/21/12  8:32 PM  Result Value Range Status   Specimen Description TRACHEAL ASPIRATE   Final   Special Requests NONE   Final   Gram Stain     Final   Value: RARE WBC PRESENT, PREDOMINANTLY PMN     NO SQUAMOUS EPITHELIAL CELLS SEEN     NO ORGANISMS SEEN     Performed at [facility]   Culture     Final   Value: Non-Pathogenic Oropharyngeal-type Flora Isolated.     Performed at [facility]   Report Status 12/24/2012 FINAL   Final  URINE CULTURE     Status: None   Collection Time    12/21/12  8:58 PM      Result Value Range Status   Specimen Description URINE, CATHETERIZED   Final   Special Requests NONE   Final   Culture  Setup Time     Final   Value: 12/22/2012 02:05     Performed at [facility] Count     Final   Value: NO GROWTH     Performed at [facility]   Culture     Final   Value: NO GROWTH     Performed at [facility]   Report Status 12/23/2012 FINAL   Final    Medical History: Past Medical History  Diagnosis Date  . Diabetes mellitus without complication 2009  . Renal disorder   . Hyperlipidemia   . Difficult intubation    Assessment: 57 YOM discharged 12/18/12 after being treated for Enterobacter aerogenes pneumonia complicated by VDRF.  Patient returned to [facility] on the same day with respiratory distress, intubated, and found to have large PE on CTA and B/L DVTs on Doppler.  Now with suspected PNA, Pharmacy is consulted to manage vancomycin and Zosyn.    This is day#4 of concurrent vancomycin/Zosyn antibiotic therapy.  Patient has AKI with slight improvement in SCr at 1.59  with estimated CrCl ~57. UOP remains adequate (0.7 ml/kg/hr on previous day).  He is now afebrile and WBC is trending down to 12.  Vancomycin trough this morning drawn before the 4th administration of current dosing regimen has resulted with a therapeutic level at 17.2.  Goal of Therapy:  Resolution of infection Vancomycin trough level 15-20 mcg/ml  Plan:  - cont vanc 750mg  IV q12h - cont Zosyn 3.375gm IV q8h, 4 hr infusion - monitor renal fxn, cultures, clinical course, vanc trough prn  Harrold Donath E. Achilles Dunk, PharmD Clinical Pharmacist - Resident Pager: [phone] Pharmacy: [phone] 12/24/2012 1:58 PM

## 2012-12-24 NOTE — Progress Notes (Signed)
Heparin rate decreased to 24/cc hr from 26 cc/hr per PharmD.

## 2012-12-24 NOTE — Evaluation (Signed)
Physical Therapy Evaluation Patient Details Name: Juan Glenn MRN: [id number] DOB: 02/12/1955 Today's Date: 12/24/2012 Time: 4098-1191 PT Time Calculation (min): 22 min  PT Assessment / Plan / Recommendation History of Present Illness  57 yo discharged 11/19 after being treated for Enterobacter aerogenes pneumonia complicated by VDRF, returned to [facility] ED same day in respiratory distress.  Intubated. CTA chest revealed large PE. Extubated 11/23.  Clinical Impression  Pt admitted with above. Pt currently with functional limitations due to the deficits listed below (see PT Problem List).  Pt will benefit from skilled PT to increase their independence and safety with mobility to allow eventual return home.  With back to back hospitalizations feel pt could benefit from CIR stay prior to return home.     PT Assessment  Patient needs continued PT services    Follow Up Recommendations  CIR    Does the patient have the potential to tolerate intense rehabilitation      Barriers to Discharge        Equipment Recommendations  None recommended by PT    Recommendations for Other Services OT consult;Rehab consult   Frequency Min 3X/week    Precautions / Restrictions Precautions Precautions: Fall   Pertinent Vitals/Pain RR high 30's with amb.  SaO2 >90% on 3L.      Mobility  Bed Mobility Bed Mobility: Not assessed (Pt in recliner.) Transfers Transfers: Sit to Stand;Stand to Sit Sit to Stand: 3: Mod assist;With upper extremity assist;With armrests;From chair/3-in-1 Stand to Sit: 4: Min assist;With upper extremity assist;To chair/3-in-1 Details for Transfer Assistance: Verbal cues for hand placement and assist to bring hips up. Ambulation/Gait Ambulation/Gait Assistance: 4: Min assist;3: Mod assist Ambulation Distance (Feet): 120 Feet Assistive device: Rolling walker Ambulation/Gait Assistance Details: Verbal cues for steering walker.  Pt required more assist as he fatigued.  Verbal/tactile cues to look up. Gait Pattern: Step-through pattern;Decreased step length - right;Decreased step length - left;Decreased stride length;Trunk flexed Gait velocity: decreased    Exercises     PT Diagnosis: Difficulty walking;Generalized weakness  PT Problem List: Decreased strength;Decreased activity tolerance;Decreased balance;Decreased mobility;Decreased cognition;Decreased knowledge of use of DME;Decreased safety awareness PT Treatment Interventions: DME instruction;Gait training;Functional mobility training;Therapeutic activities;Therapeutic exercise;Balance training;Patient/family education     PT Goals(Current goals can be found in the care plan section) Acute Rehab PT Goals Patient Stated Goal: To get better and return home. PT Goal Formulation: With patient/family Time For Goal Achievement: 12/31/12 Potential to Achieve Goals: Good  Visit Information  Last PT Received On: 12/24/12 Assistance Needed: +1 History of Present Illness: 57 yo discharged 11/19 after being treated for Enterobacter aerogenes pneumonia complicated by VDRF, returned to [facility] ED same day in respiratory distress.  Intubated. CTA chest revealed large PE. Extubated 11/23.       Prior Functioning  Home Living Family/patient expects to be discharged to:: Private residence Living Arrangements: Spouse/significant other Available Help at Discharge: Family;Available PRN/intermittently Type of Home: House Home Access: Stairs to enter [employer] of Steps: 3 Entrance Stairs-Rails: None Home Layout: One level Home Equipment: Walker - 2 wheels Prior Function Level of Independence: Independent Comments: Pt worked full time at [employer], drove, and was I with IADLs until hospitalization a couple of weeks ago. Communication Communication: No difficulties Dominant Hand: Right    Cognition  Cognition Arousal/Alertness: Awake/alert Behavior During Therapy: Flat affect Overall  Cognitive Status: Impaired/Different from baseline Problem Solving: Slow processing;Requires verbal cues    Extremity/Trunk Assessment Upper Extremity Assessment Upper Extremity Assessment: Generalized weakness  Lower Extremity Assessment Lower Extremity Assessment: Generalized weakness   Balance Static Standing Balance Static Standing - Balance Support: Bilateral upper extremity supported Static Standing - Level of Assistance: 4: Min assist  End of Session PT - End of Session Equipment Utilized During Treatment: Gait belt;Oxygen Activity Tolerance: Patient limited by fatigue Patient left: in chair;with call bell/phone within reach;with family/visitor present Nurse Communication: Mobility status  GP     [facility] 12/24/2012, 3:24 PM  [facility] PT [phone]

## 2012-12-24 NOTE — Progress Notes (Signed)
Ambulated around nursing station and back to room. Tolerated well, required 3 liter of oxygen during ambulation, post ambulation oxygen increased to 5 liter. Will wean down as appropriate.

## 2012-12-24 NOTE — Progress Notes (Signed)
ANTICOAGULATION CONSULT NOTE - Initial Consult   Pharmacy Consult for Xarelto (transition from heparin gtt) Indication: bilateral pulmonary embolus, and bilateral lower extremity DVTs  No Known Allergies  Patient Measurements: Height: 5\' 8"  (172.7 cm) Weight: 210 lb 8.6 oz (95.5 kg) IBW/kg (Calculated) : 68.4  Vital Signs: Temp: 98.2 F (36.8 C) (11/25 1156) Temp src: Oral (11/25 1156) BP: 127/78 mmHg (11/25 1441) Pulse Rate: 98 (11/25 1441)  Labs:  Recent Labs  12/22/12 0418  12/23/12 0500 12/24/12 0220 12/24/12 1005  HGB 8.2*  --  9.6* 9.5*  --   HCT 23.8*  --  29.1* 28.3*  --   PLT 327  --  366 405*  --   HEPARINUNFRC 0.44  < > 0.47 0.77* 0.48  CREATININE 1.68*  --  1.65* 1.59*  --   < > = values in this interval not displayed.  Estimated Creatinine Clearance: 56.7 ml/min (by C-G formula based on Cr of 1.59).   Medical History: Past Medical History  Diagnosis Date  . Diabetes mellitus without complication 2009  . Renal disorder   . Hyperlipidemia   . Difficult intubation     Medications:  Scheduled:  . aspirin  81 mg Oral Daily  . atorvastatin  20 mg Oral q1800  . feeding supplement (ENSURE)  1 Container Oral TID BM  . insulin aspart  0-20 Units Subcutaneous TID WC  . labetalol  300 mg Oral TID  . pantoprazole  40 mg Oral Daily  . piperacillin-tazobactam (ZOSYN)  IV  3.375 g Intravenous Q8H  . vancomycin  750 mg Intravenous Q12H   Infusions:  . sodium chloride 20 mL/hr (12/19/12 1335)  . heparin 2,400 Units/hr (12/24/12 1528)    Assessment: 57 yo M discharged 11/19 after being treated for Enterobacter aerogenes pneumonia complicated by VDRF, returned to [facility] ED same day in respiratory distress. Patient was intubated and CTA chest revealed PE. Subsequent lower extremity dopplers also revealed bilateral LE DVTs.   Patient is currently therapeutic on heparin gtt (last level 0.48).  H/H is low but stable and plt are trending up to 405. Kidney function  looks to be stable with SCr ~1.6 and estimated CrCl ~57. No bleeding issues have been reported.  Heparin discontinuation and start of Xarelto will need to be appropriately timed.   Goal of Therapy:  Full anticoagulation Monitor platelets by anticoagulation protocol: Yes   Plan:  - discontinue heparin gtt tonight at 1700 - initiate Xarelto at 1700 (at time of hep gtt discontinuation) - 15mg  PO BID with meals x21 days, followed by 20mg  daily - monitor for s/s of bleeding  Harrold Donath E. Achilles Dunk, PharmD Clinical Pharmacist - Resident Pager: [phone] Pharmacy: [phone] 12/24/2012 4:00 PM

## 2012-12-25 LAB — CULTURE, BLOOD (ROUTINE X 2): Culture: NO GROWTH

## 2012-12-25 LAB — CBC
HCT: 27.1 % — ABNORMAL LOW (ref 39.0–52.0)
MCH: 28.7 pg (ref 26.0–34.0)
MCV: 87.4 fL (ref 78.0–100.0)
Platelets: 355 10*3/uL (ref 150–400)
RBC: 3.1 MIL/uL — ABNORMAL LOW (ref 4.22–5.81)
WBC: 12.3 10*3/uL — ABNORMAL HIGH (ref 4.0–10.5)

## 2012-12-25 LAB — BASIC METABOLIC PANEL
CO2: 25 mEq/L (ref 19–32)
Calcium: 8.6 mg/dL (ref 8.4–10.5)
Chloride: 124 mEq/L — ABNORMAL HIGH (ref 96–112)
GFR calc Af Amer: 54 mL/min — ABNORMAL LOW (ref >90)
Glucose, Bld: 237 mg/dL — ABNORMAL HIGH (ref 70–99)
Potassium: 3.1 mEq/L — ABNORMAL LOW (ref 3.5–5.1)
Sodium: 157 mEq/L — ABNORMAL HIGH (ref 135–145)

## 2012-12-25 LAB — GLUCOSE, CAPILLARY
Glucose-Capillary: 255 mg/dL — ABNORMAL HIGH (ref 70–99)
Glucose-Capillary: 218 mg/dL — ABNORMAL HIGH (ref 70–99)

## 2012-12-25 MED ORDER — INSULIN GLARGINE 100 UNIT/ML ~~LOC~~ SOLN
15.0000 [IU] | Freq: Every day | SUBCUTANEOUS | Status: DC
  Administered 2012-12-25 – 2012-12-27 (×3): 15 [IU] via SUBCUTANEOUS
  Filled 2012-12-25 (×4): qty 0.15

## 2012-12-25 MED ORDER — INSULIN ASPART 100 UNIT/ML ~~LOC~~ SOLN
0.0000 [IU] | Freq: Three times a day (TID) | SUBCUTANEOUS | Status: DC
  Administered 2012-12-25 – 2012-12-26 (×3): 8 [IU] via SUBCUTANEOUS
  Administered 2012-12-26: 3 [IU] via SUBCUTANEOUS
  Administered 2012-12-26: 11 [IU] via SUBCUTANEOUS
  Administered 2012-12-27: 15 [IU] via SUBCUTANEOUS
  Administered 2012-12-27: 5 [IU] via SUBCUTANEOUS
  Administered 2012-12-27: 8 [IU] via SUBCUTANEOUS
  Administered 2012-12-28: 5 [IU] via SUBCUTANEOUS
  Administered 2012-12-28: 11 [IU] via SUBCUTANEOUS
  Administered 2012-12-28: 18:00:00 5 [IU] via SUBCUTANEOUS
  Administered 2012-12-29: 17:00:00 11 [IU] via SUBCUTANEOUS
  Administered 2012-12-29: 8 [IU] via SUBCUTANEOUS
  Administered 2012-12-29 – 2012-12-30 (×2): 11 [IU] via SUBCUTANEOUS
  Administered 2012-12-30: 5 [IU] via SUBCUTANEOUS
  Administered 2012-12-30: 8 [IU] via SUBCUTANEOUS
  Administered 2012-12-31 (×2): 3 [IU] via SUBCUTANEOUS
  Administered 2012-12-31 – 2013-01-01 (×2): 2 [IU] via SUBCUTANEOUS
  Administered 2013-01-01: 3 [IU] via SUBCUTANEOUS

## 2012-12-25 MED ORDER — DEXTROSE 5 % IV SOLN
INTRAVENOUS | Status: DC
  Administered 2012-12-25: 75 mL via INTRAVENOUS
  Administered 2012-12-26: 07:00:00 via INTRAVENOUS

## 2012-12-25 MED ORDER — INSULIN ASPART 100 UNIT/ML ~~LOC~~ SOLN
2.0000 [IU] | Freq: Three times a day (TID) | SUBCUTANEOUS | Status: DC
  Administered 2012-12-25 – 2012-12-28 (×8): 2 [IU] via SUBCUTANEOUS

## 2012-12-25 MED ORDER — INSULIN ASPART 100 UNIT/ML ~~LOC~~ SOLN
0.0000 [IU] | Freq: Every day | SUBCUTANEOUS | Status: DC
  Administered 2012-12-25: 2 [IU] via SUBCUTANEOUS
  Administered 2012-12-26 – 2012-12-27 (×2): 3 [IU] via SUBCUTANEOUS
  Administered 2012-12-29: 23:00:00 via SUBCUTANEOUS
  Administered 2012-12-30: 22:00:00 3 [IU] via SUBCUTANEOUS

## 2012-12-25 MED ORDER — LINAGLIPTIN 5 MG PO TABS
2.5000 mg | ORAL_TABLET | Freq: Every day | ORAL | Status: DC
  Administered 2012-12-25 – 2012-12-31 (×7): 2.5 mg via ORAL
  Filled 2012-12-25 (×8): qty 1

## 2012-12-25 MED ORDER — POTASSIUM CHLORIDE CRYS ER 20 MEQ PO TBCR
40.0000 meq | EXTENDED_RELEASE_TABLET | Freq: Once | ORAL | Status: AC
  Administered 2012-12-25: 40 meq via ORAL
  Filled 2012-12-25: qty 2

## 2012-12-25 NOTE — Progress Notes (Signed)
Inpatient Diabetes Program Recommendations  AACE/ADA: New Consensus Statement on Inpatient Glycemic Control (2013)  Target Ranges:  Prepandial:   less than 140 mg/dL      Peak postprandial:   less than 180 mg/dL (1-2 hours)      Critically ill patients:  140 - 180 mg/dL   Reason for Visit: Results for Juan Glenn, Juan Glenn (MRN [id number]) as of 12/25/2012 08:58  Ref. Range 12/24/2012 07:47 12/24/2012 11:12 12/24/2012 17:06 12/24/2012 22:47 12/25/2012 07:47  Glucose-Capillary Latest Range: 70-99 mg/dL 409 (H) 811 (H) 914 (H) 124 (H) 193 (H)   Consider restarting Lantus 15 units daily. Also may consider reducing Novolog correction to moderate and add Novolog meal coverage 3 units tid with meals (Hold if patient eats less than 50%).  Beryl Meager, RN, BC-ADM Inpatient Diabetes Coordinator Pager [phone]

## 2012-12-25 NOTE — Consult Note (Signed)
Physical Medicine and Rehabilitation Consult  Reason for Consult: Deconditioning Referring Physician: Dr. Butler Denmark   HPI: Juan Glenn is a 57 y.o. male with history of DM. CKD,  PRES 8/14, recent seizures with AMS complicated by aspiration/enterobacter aerogenes PNA, VDRF who was discharged to home 12/18/12 but readmitted later that day with respiratory distress due to bilateral large PE and BLE DVTs. He was intubated and started on IV heparin. Extubated on 12/22/12 without difficulty. He has had confusion with agitation and  tachypnea requiring BIPAP last pm. Foley placed for urinary retention. He is on puree diet with pudding liquids due to s/s of aspiration--MBS pending. PT evaluation done yesterday and patient noted to be deconditioned due to multiple recent hospitalizations. MD, PT recommending CIR.  On intermittent BiPAP, no pain complaints Review of Systems  HENT: Negative for hearing loss.   Eyes: Negative for blurred vision and double vision.  Respiratory: Negative for shortness of breath and wheezing.   Cardiovascular: Negative for chest pain and palpitations.  Gastrointestinal: Negative for nausea and abdominal pain.  Musculoskeletal: Negative for back pain and myalgias.  Neurological: Negative for headaches.    Past Medical History  Diagnosis Date  . Diabetes mellitus without complication 2009  . Renal disorder   . Hyperlipidemia   . Difficult intubation    Past Surgical History  Procedure Laterality Date  . No surgical history  09/2012   Family History  Problem Relation Age of Onset  . Diabetes Mother   . Congestive Heart Failure Mother     Died, 88s  . Heart attack Father     Died, 39  . Diabetes Brother   . Hypertension Sister   . Cancer Brother     Died, 32   Social History:  reports that he has never smoked. He has never used smokeless tobacco. He reports that he does not drink alcohol or use illicit drugs.  Allergies: No Known  Allergies  Medications Prior to Admission  Medication Sig Dispense Refill  . aspirin EC 81 MG tablet Take 1 tablet (81 mg total) by mouth daily.      Marland Kitchen glimepiride (AMARYL) 2 MG tablet Take 2 mg by mouth 2 (two) times daily before a meal. Take 1 tablet (2 mg total) by mouth 2 (two) times daily before meals.      Marland Kitchen labetalol (NORMODYNE) 200 MG tablet Take 1 tablet (200 mg total) by mouth 2 (two) times daily.  60 tablet  3  . levofloxacin (LEVAQUIN) 750 MG tablet Take 1 tablet (750 mg total) by mouth daily.  2 tablet  0  . Linagliptin-Metformin HCl (JENTADUETO) 2.05-998 MG TABS 1 tablet. Take 1 tablet by mouth 2 (two) times daily.      . Loratadine (CLARITIN PO) Take 1 tablet by mouth daily as needed (allergies).      . Multiple Vitamin (MULTIVITAMIN WITH MINERALS) TABS tablet Take 1 tablet by mouth daily.      . rosuvastatin (CRESTOR) 10 MG tablet Take 10 mg by mouth daily.        Home: Home Living Family/patient expects to be discharged to:: Private residence Living Arrangements: Spouse/significant other Available Help at Discharge: Family;Available PRN/intermittently Type of Home: House Home Access: Stairs to enter [employer] of Steps: 3 Entrance Stairs-Rails: None Home Layout: One level Home Equipment: Walker - 2 wheels  Functional History: Prior Function Comments: Pt worked full time at [employer], drove, and was I with IADLs until hospitalization a couple of weeks ago. Functional  Status:  Mobility: Bed Mobility Bed Mobility: Not assessed (Pt in recliner.) Transfers Transfers: Sit to Stand;Stand to Sit Sit to Stand: 3: Mod assist;With upper extremity assist;With armrests;From chair/3-in-1 Stand to Sit: 4: Min assist;With upper extremity assist;To chair/3-in-1 Ambulation/Gait Ambulation/Gait Assistance: 4: Min assist;3: Mod assist Ambulation Distance (Feet): 120 Feet Assistive device: Rolling walker Ambulation/Gait Assistance Details: Verbal cues for  steering walker.  Pt required more assist as he fatigued. Verbal/tactile cues to look up. Gait Pattern: Step-through pattern;Decreased step length - right;Decreased step length - left;Decreased stride length;Trunk flexed Gait velocity: decreased    ADL:    Cognition: Cognition Overall Cognitive Status: Impaired/Different from baseline Orientation Level: Oriented X4 Cognition Arousal/Alertness: Awake/alert Behavior During Therapy: Flat affect Overall Cognitive Status: Impaired/Different from baseline Problem Solving: Slow processing;Requires verbal cues  Blood pressure 169/84, pulse 97, temperature 99.3 F (37.4 C), temperature source Oral, resp. rate 29, height 5\' 8"  (1.727 m), weight 93.1 kg (205 lb 4 oz), SpO2 100.00%. Physical Exam  Nursing note and vitals reviewed. Constitutional: He is oriented to person, place, and time. He appears well-developed and well-nourished.  HENT:  Head: Normocephalic and atraumatic.  Eyes: Conjunctivae are normal. Pupils are equal, round, and reactive to light.  Neck: Normal range of motion. Neck supple.  Cardiovascular: Normal rate and regular rhythm.   Respiratory: No respiratory distress. He has no wheezes. He exhibits no tenderness.  Tachypnic   GI: Soft. Bowel sounds are normal. He exhibits no distension. There is no tenderness.  Musculoskeletal: He exhibits no edema and no tenderness.  Neurological: He is alert and oriented to person, place, and time.  Skin: Skin is warm and dry.   motor strength is 5/5 bilateral deltoid, bicep, tricep, grip, 4/5 bilateral hip flexor knee extensor ankle dorsiflexor plantar flexion Sensory intact to light touch both hands and feet  Results for orders placed during the hospital encounter of 12/18/12 (from the past 24 hour(s))  VANCOMYCIN, TROUGH     Status: None   Collection Time    12/24/12  9:09 AM      Result Value Range   Vancomycin Tr 17.2  10.0 - 20.0 ug/mL  HEPARIN LEVEL (UNFRACTIONATED)      Status: None   Collection Time    12/24/12 10:05 AM      Result Value Range   Heparin Unfractionated 0.48  0.30 - 0.70 IU/mL  HEMOGLOBIN A1C     Status: Abnormal   Collection Time    12/24/12 10:05 AM      Result Value Range   Hemoglobin A1C 8.0 (*) <5.7 %   Mean Plasma Glucose 183 (*) <117 mg/dL  GLUCOSE, CAPILLARY     Status: Abnormal   Collection Time    12/24/12 11:12 AM      Result Value Range   Glucose-Capillary 246 (*) 70 - 99 mg/dL  GLUCOSE, CAPILLARY     Status: Abnormal   Collection Time    12/24/12  5:06 PM      Result Value Range   Glucose-Capillary 379 (*) 70 - 99 mg/dL  GLUCOSE, CAPILLARY     Status: Abnormal   Collection Time    12/24/12 10:47 PM      Result Value Range   Glucose-Capillary 124 (*) 70 - 99 mg/dL  CBC     Status: Abnormal   Collection Time    12/25/12  4:45 AM      Result Value Range   WBC 12.3 (*) 4.0 - 10.5 K/uL   RBC 3.10 (*)  4.22 - 5.81 MIL/uL   Hemoglobin 8.9 (*) 13.0 - 17.0 g/dL   HCT 16.1 (*) 09.6 - 04.5 %   MCV 87.4  78.0 - 100.0 fL   MCH 28.7  26.0 - 34.0 pg   MCHC 32.8  30.0 - 36.0 g/dL   RDW 40.9  81.1 - 91.4 %   Platelets 355  150 - 400 K/uL  GLUCOSE, CAPILLARY     Status: Abnormal   Collection Time    12/25/12  7:47 AM      Result Value Range   Glucose-Capillary 193 (*) 70 - 99 mg/dL   Dg Chest Port 1 View  12/24/2012   CLINICAL DATA:  Pneumonia.  EXAM: PORTABLE CHEST - 1 VIEW  COMPARISON:  12/22/2012  FINDINGS: Increased densities in the right chest, particularly along the right lung periphery. Findings may represent a combination of pleural fluid and airspace disease. Slightly increased densities at the left lung base may represent volume loss. The endotracheal tube and nasogastric tube have been removed. Central line tip is in the lower SVC region.  IMPRESSION: Concern for increased airspace disease and pleural fluid in the right chest.  Low lung volumes.   Electronically Signed   By: Richarda Overlie M.D.   On: 12/24/2012 07:53     Assessment/Plan: Diagnosis: Deconditioning status post pulmonary embolism related to bilateral DVT and lower extremities 1. Does the need for close, 24 hr/day medical supervision in concert with the patient's rehab needs make it unreasonable for this patient to be served in a less intensive setting? Potentially 2. Co-Morbidities requiring supervision/potential complications: Hypertension, diabetes, chronic kidney disease 3. Due to bladder management, bowel management, safety, skin/wound care, disease management, medication administration, pain management and patient education, does the patient require 24 hr/day rehab nursing? Potentially 4. Does the patient require coordinated care of a physician, rehab nurse, PT (1-2 hrs/day, 5 days/week) and OT (1-2 hrs/day, 5 days/week) to address physical and functional deficits in the context of the above medical diagnosis(es)? Potentially Addressing deficits in the following areas: balance, endurance, locomotion, strength, transferring, bowel/bladder control, bathing, dressing and toileting 5. Can the patient actively participate in an intensive therapy program of at least 3 hrs of therapy per day at least 5 days per week? Yes 6. The potential for patient to make measurable gains while on inpatient rehab is good 7. Anticipated functional outcomes upon discharge from inpatient rehab are modified independent with PT, modified independent with OT, not applicable with SLP. 8. Estimated rehab length of stay to reach the above functional goals is: 7 days 9. Does the patient have adequate social supports to accommodate these discharge functional goals? Potentially 10. Anticipated D/C setting: Home 11. Anticipated post D/C treatments: HH therapy 12. Overall Rehab/Functional Prognosis: good  RECOMMENDATIONS: This patient's condition is appropriate for continued rehabilitative care in the following setting: If patient remains at least that min assist level than  CIR, if progresses to supervision then home with home health once medically stable Patient has agreed to participate in recommended program. Yes Note that insurance prior authorization may be required for reimbursement for recommended care.  Comment: Based on patient's previous functional level as well as underlying pathology expect that he should improve rapidly. Rehabilitation admission nurse to monitor progress     12/25/2012

## 2012-12-25 NOTE — Progress Notes (Signed)
eLink Physician-Brief Progress Note Patient Name: Juan Glenn DOB: 1956-01-28 MRN: [id number]  Date of Service  12/25/2012   HPI/Events of Note  Patient with multiple condom caths which he pulls off.  Now with bladder scan showing greater than 400 cc residual urine in bladder.  This may be contributing to his agitation.   eICU Interventions  Plan: Insert foley catheter to straight drain.   Intervention Category Intermediate Interventions: Oliguria - evaluation and management  DETERDING,ELIZABETH 12/25/2012, 1:26 AM

## 2012-12-25 NOTE — Progress Notes (Signed)
eLink Physician-Brief Progress Note Patient Name: Juan Glenn DOB: [birth year] MRN: [id number]  Date of Service  12/25/2012   HPI/Events of Note  Patient with increased RR to the 30s to 40s with "belly breathing"   Maintaining sats in the low 90s.  Does not appear to be in severe distress.  HD stable with BP of 159/80 (97) with HR of 101.     eICU Interventions  Plan: Initiate BiPAP prn for resp support Continue to monitor paitent   Intervention Category Intermediate Interventions: Respiratory distress - evaluation and management  DETERDING,ELIZABETH 12/25/2012, 2:59 AM

## 2012-12-25 NOTE — Progress Notes (Signed)
[facility] Progress Note [place] TEAM 1 - Stepdown/ICU TEAM   Juan Glenn ZOX:[id number] DOB: 10/16/1955 DOA: 12/18/2012 PCP: RECORD,CHARLES  Brief narrative: 57 y/o with h/o DM, CKD 3 admitted on 11/6 with seizure due to PRESS, diagnosed with aspiration pneumonia and discharged on Levaquin. Pt returned to the hospital the night after discharge with respiratory failure and was found to have b/l multifocal pulmonary emboli. Has righted sided DVT. He was intubated on admission and extubated on 11/23.   SIGNIFICANT EVENTS / STUDIES:  11/19 Respiratory distress. Intubated.  11/19 CTA chest >>> Bilateral PE, multifocal pneumonia  11/20 duplex > acute B LE DVT  11/21 echo done > EF 55-60, no right heart strain noted  11/21 with continued fevers, recultured.  11/21 1L bolus with hypotension>>improved  11/22 Renal US >> negative  11/23 Extubated   Assessment/Plan: Principal Problem:   Acute respiratory failure - due to pulm emboli superimposed on pneumonia - currently on 4 L of O2 - now right chest reveals increasing airspace disease and pleural fluid   Active Problems:    Aspiration pneumonia - cont VAnc and Zosyn for HCAP    Pulmonary embolism - currently on IV Heparin - will start Xarelto now that he has passed his swallow eval    DM (diabetes mellitus), type 2 with renal complications - resumed Amaryl when eating appropriately - also on Jentadueto at home- sugars still elevated therefore will resume Januvia  - watch sugars on D5 infusion     Convulsions/seizures - in setting of hypertensive emergency  HTN - has been admitted for PRESS in the recent past - currently stable on labetalol    CKD (chronic kidney disease) stage 3, GFR 30-59 ml/min  Hypernatremia - due to diuresis per PCCM notes- cont to follow  - now able to tolerate orals but sodium climbing therefore will start D5  Code Status: full code Family Communication: wife in  room Disposition Plan: sdu  Consultants: PCCM   Antibiotics: ANTIBIOTICS:  Cefepime 11/19 x 1  Vancomycin 11/19 x 1  Levaquin 11/19 >>>11/21  vanco 11/22 >>  Zosyn 11/22 >>    DVT prophylaxis: Heparin infusion  HPI/Subjective: Pt alert. Confusion last night- receiving Haldol 5 mg IV and then developed worsening resp status requiring BiPAP. Now restless but more oriented.    Objective: Blood pressure 158/86, pulse 101, temperature 99.5 F (37.5 C), temperature source Oral, resp. rate 34, height 5\' 8"  (1.727 m), weight 93.1 kg (205 lb 4 oz), SpO2 96.00%.  Intake/Output Summary (Last 24 hours) at 12/25/12 1416 Last data filed at 12/25/12 1000  Gross per 24 hour  Intake    876 ml  Output   1575 ml  Net   -699 ml     Exam: General: sitting up in a chair. AAO x3, No acute respiratory distress Lungs: Clear to auscultation bilaterally without wheezes or crackles Cardiovascular: Regular rate and rhythm without murmur gallop or rub normal S1 and S2 Abdomen: Nontender, nondistended, soft, bowel sounds positive, no rebound, no ascites, no appreciable mass Extremities: No significant cyanosis, clubbing, or edema bilateral lower extremities  Data Reviewed: Basic Metabolic Panel:  Recent Labs Lab 12/21/12 1205 12/22/12 0418 12/23/12 0500 12/24/12 0220 12/25/12 0951  NA 145 149* 152* 155* 157*  K 4.4 3.7 3.6 4.0 3.1*  CL 116* 119* 118* 120* 124*  CO2 20 21 20 24 25   GLUCOSE 290* 152* 146* 259* 237*  BUN 24* 31* 25* 22 17  CREATININE 1.54* 1.68* 1.65* 1.59*  1.58*  CALCIUM 8.0* 8.1* 8.7 8.8 8.6  MG  --   --  2.4 2.4  --   PHOS  --   --  3.8 2.7  --    Liver Function Tests:  Recent Labs Lab 12/18/12 2204  AST 37  ALT 44  ALKPHOS 77  BILITOT 0.3  PROT 7.1  ALBUMIN 2.3*   No results found for this basename: LIPASE, AMYLASE,  in the last 168 hours No results found for this basename: AMMONIA,  in the last 168 hours CBC:  Recent Labs Lab 12/18/12 2204   12/21/12 0500 12/22/12 0418 12/23/12 0500 12/24/12 0220 12/25/12 0445  WBC 15.4*  < > 17.1* 11.8* 13.1* 12.0* 12.3*  NEUTROABS 11.5*  --   --   --   --   --   --   HGB 12.2*  < > 9.3* 8.2* 9.6* 9.5* 8.9*  HCT 35.0*  < > 27.6* 23.8* 29.1* 28.3* 27.1*  MCV 85.6  < > 87.1 86.2 87.7 87.6 87.4  PLT 402*  < > 370 327 366 405* 355  < > = values in this interval not displayed. Cardiac Enzymes: No results found for this basename: CKTOTAL, CKMB, CKMBINDEX, TROPONINI,  in the last 168 hours BNP (last 3 results)  Recent Labs  12/05/12 0715 12/18/12 2151  PROBNP 85.4 342.7*   CBG:  Recent Labs Lab 12/24/12 1112 12/24/12 1706 12/24/12 2247 12/25/12 0747 12/25/12 1101  GLUCAP 246* 379* 124* 193* 255*    Recent Results (from the past 240 hour(s))  CULTURE, BLOOD (ROUTINE X 2)     Status: None   Collection Time    12/18/12 10:20 PM      Result Value Range Status   Specimen Description BLOOD LEFT ARM   Final   Special Requests BOTTLES DRAWN AEROBIC AND ANAEROBIC 9CC   Final   Culture  Setup Time     Final   Value: 12/19/2012 04:45     Performed at [facility]   Culture     Final   Value: NO GROWTH 5 DAYS     Performed at [facility]   Report Status 12/25/2012 FINAL   Final  CULTURE, BLOOD (ROUTINE X 2)     Status: None   Collection Time    12/18/12 10:30 PM      Result Value Range Status   Specimen Description BLOOD RIGHT ARM   Final   Special Requests BOTTLES DRAWN AEROBIC ONLY 10CC   Final   Culture  Setup Time     Final   Value: 12/19/2012 04:44     Performed at [facility]   Culture     Final   Value: NO GROWTH 5 DAYS     Performed at [facility]   Report Status 12/25/2012 FINAL   Final  URINE CULTURE     Status: None   Collection Time    12/18/12 11:15 PM      Result Value Range Status   Specimen Description URINE, CATHETERIZED   Final   Special Requests NONE   Final   Culture  Setup Time     Final   Value: 12/19/2012 05:13      Performed at [facility] Count     Final   Value: NO GROWTH     Performed at [facility]   Culture     Final   Value: NO GROWTH     Performed  at [facility]   Report Status 12/20/2012 FINAL   Final  MRSA PCR SCREENING     Status: None   Collection Time    12/19/12  2:32 AM      Result Value Range Status   MRSA by PCR NEGATIVE  NEGATIVE Final   Comment:            The GeneXpert MRSA Assay (FDA     approved for NASAL specimens     only), is one component of a     comprehensive MRSA colonization     surveillance program. It is not     intended to diagnose MRSA     infection nor to guide or     monitor treatment for     MRSA infections.  CULTURE, RESPIRATORY (NON-EXPECTORATED)     Status: None   Collection Time    12/20/12  9:46 AM      Result Value Range Status   Specimen Description TRACHEAL ASPIRATE   Final   Special Requests NONE   Final   Gram Stain     Final   Value: FEW WBC PRESENT, PREDOMINANTLY PMN     NO SQUAMOUS EPITHELIAL CELLS SEEN     NO ORGANISMS SEEN     Performed at [facility]   Culture     Final   Value: FEW CANDIDA ALBICANS     Performed at [facility]   Report Status 12/22/2012 FINAL   Final  URINE CULTURE     Status: None   Collection Time    12/20/12  5:36 PM      Result Value Range Status   Specimen Description URINE, RANDOM   Final   Special Requests NONE   Final   Culture  Setup Time     Final   Value: 12/20/2012 22:06     Performed at [facility] Count     Final   Value: NO GROWTH     Performed at [facility]   Culture     Final   Value: NO GROWTH     Performed at [facility]   Report Status 12/21/2012 FINAL   Final  CULTURE, BLOOD (ROUTINE X 2)     Status: None   Collection Time    12/20/12  6:15 PM      Result Value Range Status   Specimen Description BLOOD LEFT HAND   Final   Special Requests BOTTLES DRAWN AEROBIC ONLY 2.5CC   Final    Culture  Setup Time     Final   Value: 12/21/2012 02:54     Performed at [facility]   Culture     Final   Value:        BLOOD CULTURE RECEIVED NO GROWTH TO DATE CULTURE WILL BE HELD FOR 5 DAYS BEFORE ISSUING A FINAL NEGATIVE REPORT     Performed at [facility]   Report Status PENDING   Incomplete  CULTURE, BLOOD (ROUTINE X 2)     Status: None   Collection Time    12/20/12  8:34 PM      Result Value Range Status   Specimen Description BLOOD LEFT HAND   Final   Special Requests BOTTLES DRAWN AEROBIC ONLY [facility]   Final   Culture  Setup Time     Final   Value: 12/21/2012 02:54     Performed at [facility]  Final   Value:        BLOOD CULTURE RECEIVED NO GROWTH TO DATE CULTURE WILL BE HELD FOR 5 DAYS BEFORE ISSUING A FINAL NEGATIVE REPORT     Performed at [facility]   Report Status PENDING   Incomplete  CULTURE, RESPIRATORY (NON-EXPECTORATED)     Status: None   Collection Time    12/21/12  8:32 PM      Result Value Range Status   Specimen Description TRACHEAL ASPIRATE   Final   Special Requests NONE   Final   Gram Stain     Final   Value: RARE WBC PRESENT, PREDOMINANTLY PMN     NO SQUAMOUS EPITHELIAL CELLS SEEN     NO ORGANISMS SEEN     Performed at [facility]   Culture     Final   Value: Non-Pathogenic Oropharyngeal-type Flora Isolated.     Performed at [facility]   Report Status 12/24/2012 FINAL   Final  URINE CULTURE     Status: None   Collection Time    12/21/12  8:58 PM      Result Value Range Status   Specimen Description URINE, CATHETERIZED   Final   Special Requests NONE   Final   Culture  Setup Time     Final   Value: 12/22/2012 02:05     Performed at [facility] Count     Final   Value: NO GROWTH     Performed at [facility]   Culture     Final   Value: NO GROWTH     Performed at [facility]   Report Status 12/23/2012 FINAL   Final     Studies:  Recent  x-ray studies have been reviewed in detail by the Attending Physician  Scheduled Meds:  Scheduled Meds: . aspirin  81 mg Oral Daily  . atorvastatin  20 mg Oral q1800  . feeding supplement (ENSURE)  1 Container Oral TID BM  . insulin aspart  0-20 Units Subcutaneous TID WC  . labetalol  300 mg Oral TID  . pantoprazole  40 mg Oral Daily  . piperacillin-tazobactam (ZOSYN)  IV  3.375 g Intravenous Q8H  . Rivaroxaban  15 mg Oral BID WC  . [START ON 01/14/2013] rivaroxaban  20 mg Oral Q supper  . vancomycin  750 mg Intravenous Q12H   Continuous Infusions: . sodium chloride 20 mL/hr (12/19/12 1335)    Time spent on care of this patient: 35 min   Makenleigh Crownover, MD  [facility] Office  [phone] Pager - Text Page per Loretha Stapler as per below:  On-Call/Text Page:      [email or web address]      password [email or web address]  If 7PM-7AM, please contact night-coverage [email or web address] Password [email or web address] 12/25/2012, 2:16 PM   LOS: 7 days

## 2012-12-25 NOTE — Progress Notes (Signed)
Rec'd pt on bipap, pt is awake and alert, pt denies SOB.  NO resp distress noted.  Placed pt on 4 lpm [place], no distress noted, pt denies SOB. Sat 98% on 4 lpm [place].

## 2012-12-25 NOTE — Progress Notes (Signed)
Speech Language Pathology Treatment: Dysphagia  Patient Details Name: Juan Glenn MRN: [id number] DOB: 05-13-1955 Today's Date: 12/25/2012 Time: 0865-7846 SLP Time Calculation (min): 16 min  Assessment / Plan / Recommendation Clinical Impression  Pt. Alert, appears somewhat more lucid and interactive with wife at bedside.  Overall, he consumed nectar juice without difficulty.  One mild delayed throat clear following first sip.  Again he required moderate and frequent verbal reminders to take small sips.  Wife and pt. Reported no difficulty with breakfast.  He had temp of 101.3 this morning.  Aspiration risk is moderately high if he continues to consume large sips.  Continue Dys 2 and nectar with full supervision.  SLP will follow.   HPI HPI: 57 yo discharged 11/19 after being treated for Enterobacter aerogenes pneumonia complicated by VDRF, returned to [facility] ED same day in respiratory distress.  Intubated. CTA chest revealed large PE.  Extubated 11/23.  Dx resp failure, acute renal failure, encephalopathy.  BSE recommends MBS to visualize swallow function for safest recommendations.    Pertinent Vitals WDL's  SLP Plan  Continue with current plan of care    Recommendations Diet recommendations: Dysphagia 2 (fine chop);Nectar-thick liquid Liquids provided via: Cup;No straw Medication Administration: Whole meds with puree Supervision: Patient able to self feed;Full supervision/cueing for compensatory strategies Compensations: Slow rate;Small sips/bites;Check for pocketing Postural Changes and/or Swallow Maneuvers: Seated upright 90 degrees              Oral Care Recommendations: Oral care Q4 per protocol Follow up Recommendations:  (TBD) Plan: Continue with current plan of care    GO     Royce Macadamia 12/25/2012, 11:39 AM

## 2012-12-25 NOTE — Procedures (Addendum)
Objective Swallowing Evaluation:   MBS Patient Details  Name: Eythan Jayne MRN: [id number] Date of Birth: 10-14-1955  Today's Date: 12/25/2012 Time:  -     Past Medical History:  Past Medical History  Diagnosis Date  . Diabetes mellitus without complication 2009  . Renal disorder   . Hyperlipidemia   . Difficult intubation    Past Surgical History:  Past Surgical History  Procedure Laterality Date  . No surgical history  09/2012   HPI:  57 yo discharged 11/19 after being treated for Enterobacter aerogenes pneumonia complicated by VDRF, returned to [facility] ED same day in respiratory distress.  Intubated. CTA chest revealed large PE.  Extubated 11/23.  Dx resp failure, acute renal failure, encephalopathy.  BSE recommends MBS to visualize swallow function for safest recommendations.      Assessment / Plan / Recommendation Clinical Impression  Clinical impression: Pt. exhibited mild oral dysphagia caused by lingual weakness with decreased manipulation and delayed transit.  Mild-moderate pharyngeal dysphagia is characterized by sensorimotor deficits.  Decreased laryngeal elevation and epiglottic closure leading to silent aspiration with thin, laryngeal penetration with weak cough.  Chin tuck was not consistently effective in minimizing aspiration risk with recommendation of Dys 2 diet texture and nectar thick liquids, pills whole in applesauce, no straws and full supervision and assist.  ST will continue to treat pt.      Treatment Recommendation  Therapy as outlined in treatment plan below    Diet Recommendation Dysphagia 2 (Fine chop);Nectar-thick liquid   Liquid Administration via: Cup;No straw Medication Administration: Whole meds with puree Supervision: Patient able to self feed;Full supervision/cueing for compensatory strategies Compensations: Slow rate;Small sips/bites;Check for pocketing Postural Changes and/or Swallow Maneuvers: Seated upright 90 degrees    Other   Recommendations Oral Care Recommendations: Oral care Q4 per protocol Other Recommendations: Order thickener from pharmacy   Follow Up Recommendations   (TBD)    Frequency and Duration min 2x/week  2 weeks   Pertinent Vitals/Pain WDL            Reason for Referral Objectively evaluate swallowing function   Oral Phase     Pharyngeal Phase Pharyngeal - Nectar Pharyngeal - Nectar Teaspoon: Premature spillage to valleculae;Delayed swallow initiation Pharyngeal - Nectar Cup: Delayed swallow initiation;Premature spillage to valleculae Pharyngeal - Thin Pharyngeal - Thin Cup: Penetration/Aspiration during swallow;Reduced laryngeal elevation;Delayed swallow initiation;Premature spillage to pyriform sinuses;Premature spillage to valleculae;Reduced airway/laryngeal closure Penetration/Aspiration details (thin cup): Material enters airway, passes BELOW cords without attempt by patient to eject out (silent aspiration);Material enters airway, CONTACTS cords and not ejected out;Material enters airway, remains ABOVE vocal cords and not ejected out Pharyngeal - Solids Pharyngeal - Regular: Pharyngeal residue - valleculae;Reduced tongue base retraction  Cervical Esophageal Phase    GO              Breck Coons Giancarlo Askren M.Ed [employer] [phone]  12/25/2012

## 2012-12-26 ENCOUNTER — Inpatient Hospital Stay (HOSPITAL_COMMUNITY): Payer: BC Managed Care – PPO

## 2012-12-26 DIAGNOSIS — R5381 Other malaise: Secondary | ICD-10-CM

## 2012-12-26 LAB — URINALYSIS, ROUTINE W REFLEX MICROSCOPIC
Glucose, UA: 250 mg/dL — AB
Hgb urine dipstick: NEGATIVE
Protein, ur: NEGATIVE mg/dL
Urobilinogen, UA: 0.2 mg/dL (ref 0.0–1.0)

## 2012-12-26 LAB — BASIC METABOLIC PANEL
CO2: 26 mEq/L (ref 19–32)
Calcium: 8.3 mg/dL — ABNORMAL LOW (ref 8.4–10.5)
Chloride: 122 mEq/L — ABNORMAL HIGH (ref 96–112)
Creatinine, Ser: 1.54 mg/dL — ABNORMAL HIGH (ref 0.50–1.35)
Glucose, Bld: 208 mg/dL — ABNORMAL HIGH (ref 70–99)
Sodium: 157 mEq/L — ABNORMAL HIGH (ref 135–145)

## 2012-12-26 LAB — BLOOD GAS, ARTERIAL
Bicarbonate: 25.4 mEq/L — ABNORMAL HIGH (ref 20.0–24.0)
Patient temperature: 98.6
pH, Arterial: 7.467 — ABNORMAL HIGH (ref 7.350–7.450)
pO2, Arterial: 61.6 mmHg — ABNORMAL LOW (ref 80.0–100.0)

## 2012-12-26 LAB — CBC
Hemoglobin: 9 g/dL — ABNORMAL LOW (ref 13.0–17.0)
MCH: 29 pg (ref 26.0–34.0)
MCHC: 33 g/dL (ref 30.0–36.0)
MCV: 88.1 fL (ref 78.0–100.0)
Platelets: 349 10*3/uL (ref 150–400)
RBC: 3.1 MIL/uL — ABNORMAL LOW (ref 4.22–5.81)

## 2012-12-26 LAB — POTASSIUM: Potassium: 3.6 mEq/L (ref 3.5–5.1)

## 2012-12-26 LAB — GLUCOSE, CAPILLARY
Glucose-Capillary: 177 mg/dL — ABNORMAL HIGH (ref 70–99)
Glucose-Capillary: 298 mg/dL — ABNORMAL HIGH (ref 70–99)

## 2012-12-26 MED ORDER — IPRATROPIUM BROMIDE 0.02 % IN SOLN
0.5000 mg | RESPIRATORY_TRACT | Status: DC
  Administered 2012-12-26 – 2012-12-28 (×12): 0.5 mg via RESPIRATORY_TRACT
  Filled 2012-12-26 (×14): qty 2.5

## 2012-12-26 MED ORDER — IPRATROPIUM BROMIDE 0.02 % IN SOLN
0.5000 mg | Freq: Four times a day (QID) | RESPIRATORY_TRACT | Status: DC | PRN
  Administered 2012-12-26: 0.5 mg via RESPIRATORY_TRACT

## 2012-12-26 MED ORDER — HALOPERIDOL LACTATE 5 MG/ML IJ SOLN
5.0000 mg | Freq: Once | INTRAMUSCULAR | Status: AC
  Administered 2012-12-26: 5 mg via INTRAVENOUS
  Filled 2012-12-26: qty 1

## 2012-12-26 MED ORDER — FUROSEMIDE 10 MG/ML IJ SOLN
40.0000 mg | Freq: Once | INTRAMUSCULAR | Status: AC
  Administered 2012-12-26: 40 mg via INTRAVENOUS
  Filled 2012-12-26: qty 4

## 2012-12-26 MED ORDER — ALBUTEROL SULFATE (5 MG/ML) 0.5% IN NEBU
2.5000 mg | INHALATION_SOLUTION | Freq: Four times a day (QID) | RESPIRATORY_TRACT | Status: DC | PRN
  Administered 2012-12-26: 2.5 mg via RESPIRATORY_TRACT
  Filled 2012-12-26: qty 0.5

## 2012-12-26 MED ORDER — IPRATROPIUM BROMIDE 0.02 % IN SOLN
RESPIRATORY_TRACT | Status: AC
  Administered 2012-12-26: 0.5 mg via RESPIRATORY_TRACT
  Filled 2012-12-26: qty 2.5

## 2012-12-26 MED ORDER — POTASSIUM CHLORIDE CRYS ER 20 MEQ PO TBCR
30.0000 meq | EXTENDED_RELEASE_TABLET | ORAL | Status: AC
  Administered 2012-12-26 (×2): 30 meq via ORAL
  Filled 2012-12-26 (×4): qty 1

## 2012-12-26 MED ORDER — ALBUTEROL SULFATE (5 MG/ML) 0.5% IN NEBU
INHALATION_SOLUTION | RESPIRATORY_TRACT | Status: AC
  Filled 2012-12-26: qty 0.5

## 2012-12-26 MED ORDER — ALBUTEROL SULFATE (5 MG/ML) 0.5% IN NEBU
2.5000 mg | INHALATION_SOLUTION | RESPIRATORY_TRACT | Status: DC
  Administered 2012-12-26 – 2012-12-28 (×12): 2.5 mg via RESPIRATORY_TRACT
  Filled 2012-12-26 (×13): qty 0.5

## 2012-12-26 MED ORDER — NITROGLYCERIN 2 % TD OINT
1.0000 [in_us] | TOPICAL_OINTMENT | Freq: Four times a day (QID) | TRANSDERMAL | Status: DC
  Administered 2012-12-26 – 2012-12-28 (×8): 1 [in_us] via TOPICAL
  Filled 2012-12-26: qty 30

## 2012-12-26 MED ORDER — WHITE PETROLATUM GEL
Status: AC
  Administered 2012-12-26: 0.2
  Filled 2012-12-26: qty 5

## 2012-12-26 NOTE — Progress Notes (Signed)
[facility] Progress Note [place] TEAM 1 - Stepdown/ICU TEAM   Juan Glenn XBJ:[id number] DOB: 01/10/1956 DOA: 12/18/2012 PCP: RECORD,CHARLES  Brief narrative: 57 y/o with h/o DM, CKD 3 admitted on 11/6 with seizure due to PRESS, diagnosed with aspiration pneumonia and discharged on Levaquin. Pt returned to the hospital the night after discharge with respiratory failure and was found to have b/l multifocal pulmonary emboli. Has righted sided DVT. He was intubated on admission and extubated on 11/23.   SIGNIFICANT EVENTS / STUDIES:  11/19 Respiratory distress. Intubated.  11/19 CTA chest >>> Bilateral PE, multifocal pneumonia  11/20 duplex > acute B LE DVT  11/21 echo done > EF 55-60, no right heart strain noted  11/21 with continued fevers, recultured.  11/21 1L bolus with hypotension>>improved  11/22 Renal US >> negative  11/23 Extubated   Assessment/Plan: Principal Problem:   Acute respiratory failure - due to pulm emboli superimposed on pneumonia -having some distress this AM-CXR reveals pulm edema -  has received Lasix and neb per pulm and placed back on Bipap - d/c IVF - Add Nitro paste - ? Anxiety component  Active Problems:  Fever - low grade fever again this AM at 8.  - check UA - c diff negative  - on Vanc and Zosyn for PNA  Pulm edema - suspect there is some diastolic component although ECHO does not report it - add Nitro paste to labetalol    Aspiration pneumonia - cont VAnc and Zosyn for HCAP    Pulmonary embolism - started Xarelto now that he has passed his swallow eval    DM (diabetes mellitus), type 2 with renal complications - resumed Amaryl when eating appropriately - also on Jentadueto at home- sugars still elevated therefore will resume Januvia     Convulsions/seizures - in setting of hypertensive emergency  HTN - has been admitted for PRESS in the recent past - currently stable on labetalol    CKD (chronic kidney  disease) stage 3, GFR 30-59 ml/min  Hypernatremia - suspected to be due to diuresis per PCCM notes sodium climbing therefore started on D5 but now developing pulm edema therefore will d/c fluids  Code Status: full code Family Communication: wife in room Disposition Plan: sdu  Consultants: PCCM   Antibiotics: ANTIBIOTICS:  Cefepime 11/19 x 1  Vancomycin 11/19 x 1  Levaquin 11/19 >>>11/21  vanco 11/22 >>  Zosyn 11/22 >>    DVT prophylaxis: Heparin infusion  HPI/Subjective: Pt alert. Confusion again last night- tachypenic right now but states he does not feel short of breath.   Objective: Blood pressure 170/92, pulse 103, temperature 100.4 F (38 C), temperature source Oral, resp. rate 36, height 5\' 8"  (1.727 m), weight 91 kg (200 lb 9.9 oz), SpO2 100.00%.  Intake/Output Summary (Last 24 hours) at 12/26/12 1217 Last data filed at 12/26/12 1200  Gross per 24 hour  Intake 2452.5 ml  Output   2145 ml  Net  307.5 ml     Exam: General: sitting up in bed- mod resp distress with abdominal breathing AAO x3 Lungs: Clear to auscultation bilaterally without wheezes or crackles Cardiovascular: Regular rate and rhythm without murmur gallop or rub normal S1 and S2 Abdomen: Nontender, nondistended, soft, bowel sounds positive, no rebound, no ascites, no appreciable mass Extremities: No significant cyanosis, clubbing, or edema bilateral lower extremities  Data Reviewed: Basic Metabolic Panel:  Recent Labs Lab 12/22/12 0418 12/23/12 0500 12/24/12 0220 12/25/12 0951 12/26/12 0420  NA 149* 152* 155* 157*  157*  K 3.7 3.6 4.0 3.1* 3.1*  CL 119* 118* 120* 124* 122*  CO2 21 20 24 25 26   GLUCOSE 152* 146* 259* 237* 208*  BUN 31* 25* 22 17 15   CREATININE 1.68* 1.65* 1.59* 1.58* 1.54*  CALCIUM 8.1* 8.7 8.8 8.6 8.3*  MG  --  2.4 2.4 2.1  --   PHOS  --  3.8 2.7  --   --    Liver Function Tests: No results found for this basename: AST, ALT, ALKPHOS, BILITOT, PROT, ALBUMIN,  in  the last 168 hours No results found for this basename: LIPASE, AMYLASE,  in the last 168 hours No results found for this basename: AMMONIA,  in the last 168 hours CBC:  Recent Labs Lab 12/22/12 0418 12/23/12 0500 12/24/12 0220 12/25/12 0445 12/26/12 0420  WBC 11.8* 13.1* 12.0* 12.3* 13.2*  HGB 8.2* 9.6* 9.5* 8.9* 9.0*  HCT 23.8* 29.1* 28.3* 27.1* 27.3*  MCV 86.2 87.7 87.6 87.4 88.1  PLT 327 366 405* 355 349   Cardiac Enzymes: No results found for this basename: CKTOTAL, CKMB, CKMBINDEX, TROPONINI,  in the last 168 hours BNP (last 3 results)  Recent Labs  12/05/12 0715 12/18/12 2151  PROBNP 85.4 342.7*   CBG:  Recent Labs Lab 12/25/12 0747 12/25/12 1101 12/25/12 1529 12/25/12 1906 12/25/12 2200  GLUCAP 193* 255* 262* 278* 218*    Recent Results (from the past 240 hour(s))  CULTURE, BLOOD (ROUTINE X 2)     Status: None   Collection Time    12/18/12 10:20 PM      Result Value Range Status   Specimen Description BLOOD LEFT ARM   Final   Special Requests BOTTLES DRAWN AEROBIC AND ANAEROBIC 9CC   Final   Culture  Setup Time     Final   Value: 12/19/2012 04:45     Performed at [facility]   Culture     Final   Value: NO GROWTH 5 DAYS     Performed at [facility]   Report Status 12/25/2012 FINAL   Final  CULTURE, BLOOD (ROUTINE X 2)     Status: None   Collection Time    12/18/12 10:30 PM      Result Value Range Status   Specimen Description BLOOD RIGHT ARM   Final   Special Requests BOTTLES DRAWN AEROBIC ONLY 10CC   Final   Culture  Setup Time     Final   Value: 12/19/2012 04:44     Performed at [facility]   Culture     Final   Value: NO GROWTH 5 DAYS     Performed at [facility]   Report Status 12/25/2012 FINAL   Final  URINE CULTURE     Status: None   Collection Time    12/18/12 11:15 PM      Result Value Range Status   Specimen Description URINE, CATHETERIZED   Final   Special Requests NONE   Final   Culture   Setup Time     Final   Value: 12/19/2012 05:13     Performed at [facility] Count     Final   Value: NO GROWTH     Performed at [facility]   Culture     Final   Value: NO GROWTH     Performed at [facility]   Report Status 12/20/2012 FINAL   Final  MRSA PCR SCREENING  Status: None   Collection Time    12/19/12  2:32 AM      Result Value Range Status   MRSA by PCR NEGATIVE  NEGATIVE Final   Comment:            The GeneXpert MRSA Assay (FDA     approved for NASAL specimens     only), is one component of a     comprehensive MRSA colonization     surveillance program. It is not     intended to diagnose MRSA     infection nor to guide or     monitor treatment for     MRSA infections.  CULTURE, RESPIRATORY (NON-EXPECTORATED)     Status: None   Collection Time    12/20/12  9:46 AM      Result Value Range Status   Specimen Description TRACHEAL ASPIRATE   Final   Special Requests NONE   Final   Gram Stain     Final   Value: FEW WBC PRESENT, PREDOMINANTLY PMN     NO SQUAMOUS EPITHELIAL CELLS SEEN     NO ORGANISMS SEEN     Performed at [facility]   Culture     Final   Value: FEW CANDIDA ALBICANS     Performed at [facility]   Report Status 12/22/2012 FINAL   Final  URINE CULTURE     Status: None   Collection Time    12/20/12  5:36 PM      Result Value Range Status   Specimen Description URINE, RANDOM   Final   Special Requests NONE   Final   Culture  Setup Time     Final   Value: 12/20/2012 22:06     Performed at [facility] Count     Final   Value: NO GROWTH     Performed at [facility]   Culture     Final   Value: NO GROWTH     Performed at [facility]   Report Status 12/21/2012 FINAL   Final  CULTURE, BLOOD (ROUTINE X 2)     Status: None   Collection Time    12/20/12  6:15 PM      Result Value Range Status   Specimen Description BLOOD LEFT HAND   Final   Special Requests  BOTTLES DRAWN AEROBIC ONLY 2.5CC   Final   Culture  Setup Time     Final   Value: 12/21/2012 02:54     Performed at [facility]   Culture     Final   Value:        BLOOD CULTURE RECEIVED NO GROWTH TO DATE CULTURE WILL BE HELD FOR 5 DAYS BEFORE ISSUING A FINAL NEGATIVE REPORT     Performed at [facility]   Report Status PENDING   Incomplete  CULTURE, BLOOD (ROUTINE X 2)     Status: None   Collection Time    12/20/12  8:34 PM      Result Value Range Status   Specimen Description BLOOD LEFT HAND   Final   Special Requests BOTTLES DRAWN AEROBIC ONLY [facility]   Final   Culture  Setup Time     Final   Value: 12/21/2012 02:54     Performed at [facility]   Culture     Final   Value:        BLOOD CULTURE RECEIVED NO GROWTH TO DATE CULTURE  WILL BE HELD FOR 5 DAYS BEFORE ISSUING A FINAL NEGATIVE REPORT     Performed at [facility]   Report Status PENDING   Incomplete  CULTURE, RESPIRATORY (NON-EXPECTORATED)     Status: None   Collection Time    12/21/12  8:32 PM      Result Value Range Status   Specimen Description TRACHEAL ASPIRATE   Final   Special Requests NONE   Final   Gram Stain     Final   Value: RARE WBC PRESENT, PREDOMINANTLY PMN     NO SQUAMOUS EPITHELIAL CELLS SEEN     NO ORGANISMS SEEN     Performed at [facility]   Culture     Final   Value: Non-Pathogenic Oropharyngeal-type Flora Isolated.     Performed at [facility]   Report Status 12/24/2012 FINAL   Final  URINE CULTURE     Status: None   Collection Time    12/21/12  8:58 PM      Result Value Range Status   Specimen Description URINE, CATHETERIZED   Final   Special Requests NONE   Final   Culture  Setup Time     Final   Value: 12/22/2012 02:05     Performed at [facility] Count     Final   Value: NO GROWTH     Performed at [facility]   Culture     Final   Value: NO GROWTH     Performed at [facility]   Report Status  12/23/2012 FINAL   Final  CLOSTRIDIUM DIFFICILE BY PCR     Status: None   Collection Time    12/26/12 10:37 AM      Result Value Range Status   C difficile by pcr NEGATIVE  NEGATIVE Final     Studies:  Recent x-ray studies have been reviewed in detail by the Attending Physician  Scheduled Meds:  Scheduled Meds: . aspirin  81 mg Oral Daily  . atorvastatin  20 mg Oral q1800  . feeding supplement (ENSURE)  1 Container Oral TID BM  . insulin aspart  0-15 Units Subcutaneous TID WC  . insulin aspart  0-5 Units Subcutaneous QHS  . insulin aspart  2 Units Subcutaneous TID WC  . insulin glargine  15 Units Subcutaneous QHS  . labetalol  300 mg Oral TID  . linagliptin  2.5 mg Oral Daily  . pantoprazole  40 mg Oral Daily  . piperacillin-tazobactam (ZOSYN)  IV  3.375 g Intravenous Q8H  . Rivaroxaban  15 mg Oral BID WC  . [START ON 01/14/2013] rivaroxaban  20 mg Oral Q supper  . vancomycin  750 mg Intravenous Q12H   Continuous Infusions: . sodium chloride 1,000 mL (12/26/12 0600)  . dextrose 75 mL/hr at 12/26/12 0710    Time spent on care of this patient: 35 min   Calvert Cantor, MD  [facility] Office  [phone] Pager - Text Page per Loretha Stapler as per below:  On-Call/Text Page:      [email or web address]      password [email or web address]  If 7PM-7AM, please contact night-coverage [email or web address] Password [email or web address] 12/26/2012, 12:17 PM   LOS: 8 days

## 2012-12-26 NOTE — Progress Notes (Signed)
Discussed transfer with Dr Butler Denmark. Per Dr Butler Denmark ok to move patient. Plan to call report now then move in 30 minutes

## 2012-12-26 NOTE — Progress Notes (Signed)
PULMONARY  / CRITICAL CARE MEDICINE  Name: Juan Glenn MRN: [id number] DOB: 01/28/1956    ADMISSION DATE:  12/18/2012 CONSULTATION DATE:  12/18/2012  REFERRING MD :  EDP PRIMARY SERVICE:  PCCM  CHIEF COMPLAINT:  Acute respiratory failure  BRIEF PATIENT DESCRIPTION: 57 yo discharged 11/19 after being treated for Enterobacter aerogenes pneumonia complicated by VDRF, returned to [facility] ED same day in respiratory distress.  Intubated. CTA chest revealed large PE.  SIGNIFICANT EVENTS / STUDIES:  11/19  Respiratory distress. Intubated. 11/19  CTA chest >>> Bilateral PE, multifocal pneumonia 11/20 duplex > acute B LE DVT 11/21 echo done > EF 55-60, no right heart strain noted 11/21 with continued fevers, recultured.  11/21 1L bolus with hypotension>>improved 11/22 Renal US >> negative 11/23 Extubated  LINES / TUBES: OETT 11/19 >>>11/23 OGT 11/19 >>>11/23 Foley 11/19 >>> R IJ CVC 11/22>>>  CULTURES: 11/19 Blood >>> 11/19 Urine >>>no growth final 11/21 blood culture>>> 11/21 urine culture>>> no growth final 11/21 respiratory culture>>>few candida albicans final 11/27 pulmonary recalled due to increased WOB.  ANTIBIOTICS: Cefepime 11/19 x 1 Vancomycin 11/19 x 1 Levaquin 11/19 >>>11/21 vanco 11/22 >> Zosyn 11/22 >>   INTERVAL HISTORY: Afebrile over 24 hours.  Awake, c/o SOB.  VITAL SIGNS: Temp:  [99.1 F (37.3 C)-100.4 F (38 C)] 99.4 F (37.4 C) (11/27 1224) Pulse Rate:  [85-114] 103 (11/27 1130) Resp:  [15-41] 36 (11/27 1130) BP: (136-170)/(76-111) 170/92 mmHg (11/27 1146) SpO2:  [94 %-100 %] 100 % (11/27 1130) FiO2 (%):  [40 %] 40 % (11/27 1200) Weight:  [91 kg (200 lb 9.9 oz)] 91 kg (200 lb 9.9 oz) (11/27 0330)  HEMODYNAMICS:   VENTILATOR SETTINGS: Vent Mode:  [-]  FiO2 (%):  [40 %] 40 %  INTAKE / OUTPUT: Intake/Output     11/26 0701 - 11/27 0700 11/27 0701 - 11/28 0700   P.O. 240 360   I.V. (mL/kg) 1217.5 (13.4) 460 (5.1)   IV Piggyback 450 200    Total Intake(mL/kg) 1907.5 (21) 1020 (11.2)   Urine (mL/kg/hr) 2245 (1) 1925 (3.4)   Stool  2 (0)   Total Output 2245 1927   Net -337.5 -907        Stool Occurrence 4 x     PHYSICAL EXAMINATION:  General:  Awake, appears tired laying in bed.  In respiratory distress. Neuro:  RASS +2, follows commands. HEENT: AT/[place], [place] in place Cardiovascular: Regular rate and rhythm Lungs:  Bilateral air entry, no w/r/r Abdomen:  Soft, nontender, hyperactive BS, mildly distended Musculoskeletal:  Moves all extremities, trace LE edema bilaterally, symmetric Skin:  Intact  LABS:  CBC  Recent Labs Lab 12/24/12 0220 12/25/12 0445 12/26/12 0420  WBC 12.0* 12.3* 13.2*  HGB 9.5* 8.9* 9.0*  HCT 28.3* 27.1* 27.3*  PLT 405* 355 349   Coag's No results found for this basename: APTT, INR,  in the last 168 hours BMET  Recent Labs Lab 12/24/12 0220 12/25/12 0951 12/26/12 0420  NA 155* 157* 157*  K 4.0 3.1* 3.1*  CL 120* 124* 122*  CO2 24 25 26   BUN 22 17 15   CREATININE 1.59* 1.58* 1.54*  GLUCOSE 259* 237* 208*   Electrolytes  Recent Labs Lab 12/23/12 0500 12/24/12 0220 12/25/12 0951 12/26/12 0420  CALCIUM 8.7 8.8 8.6 8.3*  MG 2.4 2.4 2.1  --   PHOS 3.8 2.7  --   --    Sepsis Markers No results found for this basename: LATICACIDVEN, PROCALCITON, O2SATVEN,  in the last  168 hours  ABG  Recent Labs Lab 12/26/12 0549  PHART 7.467*  PCO2ART 35.6  PO2ART 61.6*   Liver Enzymes No results found for this basename: AST, ALT, ALKPHOS, BILITOT, ALBUMIN,  in the last 168 hours  Cardiac Enzymes No results found for this basename: TROPONINI, PROBNP,  in the last 168 hours  Glucose  Recent Labs Lab 12/24/12 2247 12/25/12 0747 12/25/12 1101 12/25/12 1529 12/25/12 1906 12/25/12 2200  GLUCAP 124* 193* 255* 262* 278* 218*   CXR:  11/24 Improved aeration bilaterally, some residual atelectasis/infiltrate bilateral bases  ASSESSMENT / PLAN:  PULMONARY A:  Acute respiratory  failure in setting of bilateral pulmonary embolism.   Recent Enterobacter aerogenes pneumonia. Suspected new HCAP TTE with no right heart strain or elevated P pressure P:   - Extubated 11/23. - Place on BiPAP. - Diureses as ordered. - Bronchodilators as ordered. - Will attempt to avoid intubation. - Anticoagulation as below. - Abx per ID section.  CARDIOVASCULAR A: Hypertension B PE P:  - Goal MAP>60. - Preadmission Labetalol and ASA reordered. - Increased labetalol 300 TID.  RENAL A:  Acute renal failure, non-oliguric; suspect ATN following transient shock; CVP 10 Renal US negative Hypernatremic P:   - Follow CVP, goal > 10. - Trend BMP. - NS@kvo . - Free water stopped now that is extubated.  GASTROINTESTINAL A:  No issues. P:   - Diet as ordered.  HEMATOLOGIC A:  Acute pulmonary embolism. Anemia, likely dilutional with no active bleed P:  - Trend CBC / APTT. - Continue heparin per Pharmacy.  INFECTIOUS A:  Recovering from aspiration / enterobacter pneumonia.  Completed tx 11/21 with 14 d abx for enterobacter PNA  Consider new HCAP, consider pulm infarct P:   - F/u cultures.  - Continue vanc/zosyn started 11/22 and would finish an 8 day course.  ENDOCRINE  A:  DMII.  Hyperglycemia P:   - Started lantus and changed to phase 3 SSI. - Hold Amaryl, Jentadueto  NEUROLOGIC A:  Acute encephalopathy.  Resolved. P:   - Monitor.  Will place back on BiPAP, diurese and bronchodilate.  No indication for ABG at this time.  CC time 35 min.  Alyson Reedy, M.D. [facility] Pulmonary/Critical Care Medicine. Pager: [phone]. After hours pager: [phone].

## 2012-12-26 NOTE — Progress Notes (Signed)
eLink Physician-Brief Progress Note Patient Name: Juan Glenn DOB: 13-Apr-1955 MRN: [id number]  Date of Service  12/26/2012   HPI/Events of Note  Severe agitation attempting to get OOB.  Had similar issues last pm and responded to 5 mg haldol IM.  HD stable.  Last EKG QTc less than 500.   eICU Interventions  Plan: Haldol 5 mg IV now Nurse to call back if patient has not responded to dosing in 30 minutes.   Intervention Category Major Interventions: Delirium, psychosis, severe agitation - evaluation and management  Constance Whittle 12/26/2012, 2:22 AM

## 2012-12-26 NOTE — Progress Notes (Signed)
Orders to transfer out cancelled per Dr Butler Denmark

## 2012-12-26 NOTE — Progress Notes (Signed)
eLink Nursing ICU Electrolyte Replacement Protocol  Patient Name: Juan Glenn DOB: [birth year] MRN: [id number]  Date of Service  12/26/2012   HPI/Events of Note    Recent Labs Lab 12/22/12 0418 12/23/12 0500 12/24/12 0220 12/25/12 0951 12/26/12 0420  NA 149* 152* 155* 157* 157*  K 3.7 3.6 4.0 3.1* 3.1*  CL 119* 118* 120* 124* 122*  CO2 21 20 24 25 26   GLUCOSE 152* 146* 259* 237* 208*  BUN 31* 25* 22 17 15   CREATININE 1.68* 1.65* 1.59* 1.58* 1.54*  CALCIUM 8.1* 8.7 8.8 8.6 8.3*  MG  --  2.4 2.4 2.1  --   PHOS  --  3.8 2.7  --   --     Estimated Creatinine Clearance: 57.2 ml/min (by C-G formula based on Cr of 1.54).  Intake/Output     11/26 0701 - 11/27 0700   P.O. 240   I.V. (mL/kg) 1057.5 (11.6)   IV Piggyback 437.5   Total Intake(mL/kg) 1735 (19.1)   Urine (mL/kg/hr) 2245 (1)   Total Output 2245   Net -510       Stool Occurrence 4 x    - I/O DETAILED x24h    Total I/O In: 937.5 [I.V.:750; IV Piggyback:187.5] Out: 1245 [Urine:1245] - I/O THIS SHIFT    ASSESSMENT   eICURN Interventions  K+3.1 replaced using ICU electrolyte protocol   ASSESSMENT: MAJOR ELECTROLYTE    Merita Norton 12/26/2012, 6:34 AM

## 2012-12-26 NOTE — Progress Notes (Signed)
Acute respiratory distress, tachypnea, increased WOB. Upon auscultation, noted left sided coarse crackles. Dr Molli Knock called to bedside to evaluate. Orders for breathing treatment, 40 of lasix stat then place on bipap

## 2012-12-27 LAB — CULTURE, BLOOD (ROUTINE X 2)
Culture: NO GROWTH
Culture: NO GROWTH

## 2012-12-27 LAB — GLUCOSE, CAPILLARY
Glucose-Capillary: 376 mg/dL — ABNORMAL HIGH (ref 70–99)
Glucose-Capillary: 257 mg/dL — ABNORMAL HIGH (ref 70–99)
Glucose-Capillary: 292 mg/dL — ABNORMAL HIGH (ref 70–99)

## 2012-12-27 LAB — CBC
HCT: 30.4 % — ABNORMAL LOW (ref 39.0–52.0)
Hemoglobin: 10.1 g/dL — ABNORMAL LOW (ref 13.0–17.0)
MCH: 29 pg (ref 26.0–34.0)
MCHC: 33.2 g/dL (ref 30.0–36.0)
RBC: 3.48 MIL/uL — ABNORMAL LOW (ref 4.22–5.81)
WBC: 12.9 10*3/uL — ABNORMAL HIGH (ref 4.0–10.5)

## 2012-12-27 LAB — PHOSPHORUS: Phosphorus: 4.6 mg/dL (ref 2.3–4.6)

## 2012-12-27 LAB — BASIC METABOLIC PANEL
BUN: 19 mg/dL (ref 6–23)
CO2: 26 mEq/L (ref 19–32)
Calcium: 8.8 mg/dL (ref 8.4–10.5)
Chloride: 118 mEq/L — ABNORMAL HIGH (ref 96–112)
Glucose, Bld: 202 mg/dL — ABNORMAL HIGH (ref 70–99)
Potassium: 3.7 mEq/L (ref 3.5–5.1)
Sodium: 155 mEq/L — ABNORMAL HIGH (ref 135–145)

## 2012-12-27 NOTE — Progress Notes (Signed)
1536 12/27/12 nsg Patient to unit 6E per wheelchair alert with periods of confusion. Wife and family in room. Report received by CN ;  Wife oriented to unit set up( wife will be staying through the night). Telemetry placed by CN ;patient for transfer to stepdown per charge RN waiting for bed; per report Nitro patch is at pt's lt upper arm but none noted by RN. No skin issues noted

## 2012-12-27 NOTE — Progress Notes (Signed)
ANTICOAGULATION CONSULT NOTE - Follow Up   Pharmacy Consult for Xarelto  Indication: bilateral pulmonary embolus, and bilateral lower extremity DVTs  No Known Allergies  Patient Measurements: Height: 5\' 8"  (172.7 cm) Weight: 194 lb 0.1 oz (88 kg) IBW/kg (Calculated) : 68.4  Vital Signs: Temp: 97.6 F (36.4 C) (11/28 0417) Temp src: Axillary (11/28 0417) BP: 145/99 mmHg (11/28 0700) Pulse Rate: 106 (11/28 0700)  Labs:  Recent Labs  12/24/12 1005  12/25/12 0445 12/25/12 0951 12/26/12 0420 12/27/12 0334  HGB  --   < > 8.9*  --  9.0* 10.1*  HCT  --   --  27.1*  --  27.3* 30.4*  PLT  --   --  355  --  349 364  HEPARINUNFRC 0.48  --   --   --   --   --   CREATININE  --   --   --  1.58* 1.54* 1.79*  < > = values in this interval not displayed.  Estimated Creatinine Clearance: 48.5 ml/min (by C-G formula based on Cr of 1.79).  Medical History: Past Medical History  Diagnosis Date  . Diabetes mellitus without complication 2009  . Renal disorder   . Hyperlipidemia   . Difficult intubation    Medications:  Scheduled:  . ipratropium  0.5 mg Nebulization Q4H   And  . albuterol  2.5 mg Nebulization Q4H  . aspirin  81 mg Oral Daily  . atorvastatin  20 mg Oral q1800  . feeding supplement (ENSURE)  1 Container Oral TID BM  . insulin aspart  0-15 Units Subcutaneous TID WC  . insulin aspart  0-5 Units Subcutaneous QHS  . insulin aspart  2 Units Subcutaneous TID WC  . insulin glargine  15 Units Subcutaneous QHS  . labetalol  300 mg Oral TID  . linagliptin  2.5 mg Oral Daily  . nitroGLYCERIN  1 inch Topical Q6H  . pantoprazole  40 mg Oral Daily  . piperacillin-tazobactam (ZOSYN)  IV  3.375 g Intravenous Q8H  . Rivaroxaban  15 mg Oral BID WC  . [START ON 01/14/2013] rivaroxaban  20 mg Oral Q supper  . vancomycin  750 mg Intravenous Q12H   Infusions:  . sodium chloride 1,000 mL (12/26/12 0600)   Assessment: 57 yo M discharged 11/19 after being treated for Enterobacter  aerogenes pneumonia complicated by VDRF, returned to [facility] ED same day in respiratory distress. Patient was intubated and CTA chest revealed PE. Subsequent lower extremity dopplers also revealed bilateral LE DVTs.   Treatment of Deep Vein Thrombosis (DVT), Pulmonary Embolism (PE), and Reduction in the Risk of Recurrence of DVT and of PE: Avoid the use of XARELTO in patients with CrCl <30 mL/min due to an expected increase in rivaroxaban exposure and pharmacodynamic effects in this patient population.  (Product package insert)  Currently creatinine is 1.7 with an estimated crcl of 48.42ml/min.  His H/H is stable as well as platelets and no noted bleeding complications.  Current regimen of 15mg  bid x 21 days followed by 20mg  daily is appropriate.  Goal of Therapy:  Full anticoagulation Monitor platelets by anticoagulation protocol: Yes   Plan:  - Continue current plan for taper - monitor for s/s of bleeding - monitor renal function and adjust  Nadara Mustard, PharmD., MS Clinical Pharmacist Pager:  [phone] Thank you for allowing pharmacy to be part of this patients care team. 12/27/2012 8:47 AM

## 2012-12-27 NOTE — Progress Notes (Signed)
Inpatient Diabetes Program Recommendations  AACE/ADA: New Consensus Statement on Inpatient Glycemic Control (2013)  Target Ranges:  Prepandial:   less than 140 mg/dL      Peak postprandial:   less than 180 mg/dL (1-2 hours)      Critically ill patients:  140 - 180 mg/dL   Reason for Visit: Results for MOSS, BERRY (MRN [id number]) as of 12/27/2012 11:38  Ref. Range 12/26/2012 07:53 12/26/2012 11:59 12/26/2012 15:45 12/26/2012 20:02 12/27/2012 07:27  Glucose-Capillary Latest Range: 70-99 mg/dL 409 (H) 811 (H) 914 (H) 298 (H) 209 (H)   Consider increasing Lantus to 20 units daily and increase Novolog meal coverage to 4 units tid with meals.    Thanks, Beryl Meager, RN, BC-ADM Inpatient Diabetes Coordinator Pager [phone]

## 2012-12-27 NOTE — Progress Notes (Signed)
Nurse removed patient from Bipap. Placed on 4lnc to eat. Patient sats 98%. No distress. RT will continue to monitor.

## 2012-12-27 NOTE — Progress Notes (Signed)
ANTIBIOTIC CONSULT NOTE - Follow Up  Pharmacy Consult:  Vancomycin / Zosyn - Day 7 Indication:  suspected PNA  No Known Allergies  Patient Measurements: Height: 5\' 8"  (172.7 cm) Weight: 194 lb 0.1 oz (88 kg) IBW/kg (Calculated) : 68.4  Vital Signs: Temp: 97.6 F (36.4 C) (11/28 0417) Temp src: Axillary (11/28 0417) BP: 145/99 mmHg (11/28 0700) Pulse Rate: 106 (11/28 0700) Intake/Output from previous day: 11/27 0701 - 11/28 0700 In: 1525 [P.O.:360; I.V.:540; IV Piggyback:625] Out: 5077 [Urine:5075; Stool:2]  Labs:  Recent Labs  12/25/12 0445 12/25/12 0951 12/26/12 0420 12/27/12 0334  WBC 12.3*  --  13.2* 12.9*  HGB 8.9*  --  9.0* 10.1*  PLT 355  --  349 364  CREATININE  --  1.58* 1.54* 1.79*   Estimated Creatinine Clearance: 48.5 ml/min (by C-G formula based on Cr of 1.79).  Recent Labs  12/24/12 0909  [facility] 17.2    Microbiology: Recent Results (from the past 720 hour(s))  URINE CULTURE     Status: None   Collection Time    12/05/12 12:41 AM      Result Value Range Status   Specimen Description URINE, RANDOM   Final   Special Requests Normal   Final   Culture  Setup Time     Final   Value: 12/05/2012 02:45     Performed at [facility] Count     Final   Value: NO GROWTH     Performed at [facility]   Culture     Final   Value: NO GROWTH     Performed at [facility]   Report Status 12/06/2012 FINAL   Final  MRSA PCR SCREENING     Status: None   Collection Time    12/05/12  4:47 AM      Result Value Range Status   MRSA by PCR NEGATIVE  NEGATIVE Final   Comment:            The GeneXpert MRSA Assay (FDA     approved for NASAL specimens     only), is one component of a     comprehensive MRSA colonization     surveillance program. It is not     intended to diagnose MRSA     infection nor to guide or     monitor treatment for     MRSA infections.  CULTURE, BLOOD (ROUTINE X 2)     Status: None   Collection  Time    12/05/12  5:30 AM      Result Value Range Status   Specimen Description BLOOD RIGHT HAND   Final   Special Requests BOTTLES DRAWN AEROBIC ONLY 2CC   Final   Culture  Setup Time     Final   Value: 12/05/2012 09:24     Performed at [facility]   Culture     Final   Value: NO GROWTH 5 DAYS     Performed at [facility]   Report Status 12/11/2012 FINAL   Final  CULTURE, BLOOD (ROUTINE X 2)     Status: None   Collection Time    12/05/12  5:45 AM      Result Value Range Status   Specimen Description BLOOD RIGHT HAND   Final   Special Requests BOTTLES DRAWN AEROBIC ONLY 3CC   Final   Culture  Setup Time     Final   Value: 12/05/2012 09:24  Performed at [facility]     Final   Value: NO GROWTH 5 DAYS     Performed at [facility]   Report Status 12/11/2012 FINAL   Final  URINE CULTURE     Status: None   Collection Time    12/05/12  8:44 AM      Result Value Range Status   Specimen Description URINE, CATHETERIZED   Final   Special Requests NONE   Final   Culture  Setup Time     Final   Value: 12/05/2012 16:14     Performed at [facility] Count     Final   Value: NO GROWTH     Performed at [facility]   Culture     Final   Value: NO GROWTH     Performed at [facility]   Report Status 12/06/2012 FINAL   Final  CULTURE, RESPIRATORY (NON-EXPECTORATED)     Status: None   Collection Time    12/05/12  7:49 PM      Result Value Range Status   Specimen Description TRACHEAL ASPIRATE   Final   Special Requests NONE   Final   Gram Stain     Final   Value: ABUNDANT WBC PRESENT, PREDOMINANTLY PMN     RARE SQUAMOUS EPITHELIAL CELLS PRESENT     FEW GRAM POSITIVE COCCI     IN PAIRS RARE YEAST     Performed at [facility]   Culture     Final   Value: FEW CANDIDA ALBICANS     Performed at [facility]   Report Status 12/08/2012 FINAL   Final  URINE CULTURE     Status: None    Collection Time    12/09/12  4:57 PM      Result Value Range Status   Specimen Description URINE, CATHETERIZED   Final   Special Requests NONE   Final   Culture  Setup Time     Final   Value: 12/09/2012 17:24     Performed at [facility]   Culture     Final   Value: NO GROWTH     Performed at [facility]   Report Status 12/10/2012 FINAL   Final  CULTURE, BLOOD (ROUTINE X 2)     Status: None   Collection Time    12/09/12  7:37 PM      Result Value Range Status   Specimen Description BLOOD ARM LEFT   Final   Special Requests BOTTLES DRAWN AEROBIC ONLY 2CC   Final   Culture  Setup Time     Final   Value: 12/10/2012 01:10     Performed at [facility]   Culture     Final   Value: NO GROWTH 5 DAYS     Performed at [facility]   Report Status 12/16/2012 FINAL   Final  CULTURE, BLOOD (ROUTINE X 2)     Status: None   Collection Time    12/09/12  7:42 PM      Result Value Range Status   Specimen Description BLOOD HAND LEFT   Final   Special Requests BOTTLES DRAWN AEROBIC ONLY 3CC   Final   Culture  Setup Time     Final   Value: 12/10/2012 01:10     Performed at [facility]   Culture     Final   Value: NO GROWTH  5 DAYS     Performed at [facility]   Report Status 12/16/2012 FINAL   Final  CULTURE, RESPIRATORY (NON-EXPECTORATED)     Status: None   Collection Time    12/09/12  8:14 PM      Result Value Range Status   Specimen Description TRACHEAL ASPIRATE   Final   Special Requests NONE   Final   Gram Stain     Final   Value: ABUNDANT WBC PRESENT,BOTH PMN AND MONONUCLEAR     RARE SQUAMOUS EPITHELIAL CELLS PRESENT     NO ORGANISMS SEEN     Performed at [facility]   Culture     Final   Value: FEW ENTEROBACTER AEROGENES     Performed at [facility]   Report Status 12/12/2012 FINAL   Final   Organism ID, Bacteria ENTEROBACTER AEROGENES   Final  CULTURE, BLOOD (ROUTINE X 2)     Status: None   Collection Time     12/18/12 10:20 PM      Result Value Range Status   Specimen Description BLOOD LEFT ARM   Final   Special Requests BOTTLES DRAWN AEROBIC AND ANAEROBIC 9CC   Final   Culture  Setup Time     Final   Value: 12/19/2012 04:45     Performed at [facility]   Culture     Final   Value: NO GROWTH 5 DAYS     Performed at [facility]   Report Status 12/25/2012 FINAL   Final  CULTURE, BLOOD (ROUTINE X 2)     Status: None   Collection Time    12/18/12 10:30 PM      Result Value Range Status   Specimen Description BLOOD RIGHT ARM   Final   Special Requests BOTTLES DRAWN AEROBIC ONLY 10CC   Final   Culture  Setup Time     Final   Value: 12/19/2012 04:44     Performed at [facility]   Culture     Final   Value: NO GROWTH 5 DAYS     Performed at [facility]   Report Status 12/25/2012 FINAL   Final  URINE CULTURE     Status: None   Collection Time    12/18/12 11:15 PM      Result Value Range Status   Specimen Description URINE, CATHETERIZED   Final   Special Requests NONE   Final   Culture  Setup Time     Final   Value: 12/19/2012 05:13     Performed at [facility] Count     Final   Value: NO GROWTH     Performed at [facility]   Culture     Final   Value: NO GROWTH     Performed at [facility]   Report Status 12/20/2012 FINAL   Final  MRSA PCR SCREENING     Status: None   Collection Time    12/19/12  2:32 AM      Result Value Range Status   MRSA by PCR NEGATIVE  NEGATIVE Final   Comment:            The GeneXpert MRSA Assay (FDA     approved for NASAL specimens     only), is one component of a     comprehensive MRSA colonization     surveillance program. It is not     intended to diagnose MRSA  infection nor to guide or     monitor treatment for     MRSA infections.  CULTURE, RESPIRATORY (NON-EXPECTORATED)     Status: None   Collection Time    12/20/12  9:46 AM      Result Value Range Status    Specimen Description TRACHEAL ASPIRATE   Final   Special Requests NONE   Final   Gram Stain     Final   Value: FEW WBC PRESENT, PREDOMINANTLY PMN     NO SQUAMOUS EPITHELIAL CELLS SEEN     NO ORGANISMS SEEN     Performed at [facility]   Culture     Final   Value: FEW CANDIDA ALBICANS     Performed at [facility]   Report Status 12/22/2012 FINAL   Final  URINE CULTURE     Status: None   Collection Time    12/20/12  5:36 PM      Result Value Range Status   Specimen Description URINE, RANDOM   Final   Special Requests NONE   Final   Culture  Setup Time     Final   Value: 12/20/2012 22:06     Performed at [facility] Count     Final   Value: NO GROWTH     Performed at [facility]   Culture     Final   Value: NO GROWTH     Performed at [facility]   Report Status 12/21/2012 FINAL   Final  CULTURE, BLOOD (ROUTINE X 2)     Status: None   Collection Time    12/20/12  6:15 PM      Result Value Range Status   Specimen Description BLOOD LEFT HAND   Final   Special Requests BOTTLES DRAWN AEROBIC ONLY 2.5CC   Final   Culture  Setup Time     Final   Value: 12/21/2012 02:54     Performed at [facility]   Culture     Final   Value: NO GROWTH 5 DAYS     Performed at [facility]   Report Status 12/27/2012 FINAL   Final  CULTURE, BLOOD (ROUTINE X 2)     Status: None   Collection Time    12/20/12  8:34 PM      Result Value Range Status   Specimen Description BLOOD LEFT HAND   Final   Special Requests BOTTLES DRAWN AEROBIC ONLY [facility]   Final   Culture  Setup Time     Final   Value: 12/21/2012 02:54     Performed at [facility]   Culture     Final   Value: NO GROWTH 5 DAYS     Performed at [facility]   Report Status 12/27/2012 FINAL   Final  CULTURE, RESPIRATORY (NON-EXPECTORATED)     Status: None   Collection Time    12/21/12  8:32 PM      Result Value Range Status   Specimen Description  TRACHEAL ASPIRATE   Final   Special Requests NONE   Final   Gram Stain     Final   Value: RARE WBC PRESENT, PREDOMINANTLY PMN     NO SQUAMOUS EPITHELIAL CELLS SEEN     NO ORGANISMS SEEN     Performed at [facility]   Culture     Final   Value: Non-Pathogenic Oropharyngeal-type Flora Isolated.     Performed at  [facility]   Report Status 12/24/2012 FINAL   Final  URINE CULTURE     Status: None   Collection Time    12/21/12  8:58 PM      Result Value Range Status   Specimen Description URINE, CATHETERIZED   Final   Special Requests NONE   Final   Culture  Setup Time     Final   Value: 12/22/2012 02:05     Performed at [facility] Count     Final   Value: NO GROWTH     Performed at [facility]   Culture     Final   Value: NO GROWTH     Performed at [facility]   Report Status 12/23/2012 FINAL   Final  CLOSTRIDIUM DIFFICILE BY PCR     Status: None   Collection Time    12/26/12 10:37 AM      Result Value Range Status   C difficile by pcr NEGATIVE  NEGATIVE Final   Medical History: Past Medical History  Diagnosis Date  . Diabetes mellitus without complication 2009  . Renal disorder   . Hyperlipidemia   . Difficult intubation    Assessment: 57 YOM discharged 12/18/12 after being treated for Enterobacter aerogenes pneumonia complicated by VDRF.  Patient returned to [facility] on the same day with respiratory distress, intubated, and found to have large PE on CTA and B/L DVTs on Doppler.  Now with suspected PNA, Pharmacy is consulted to manage vancomycin and Zosyn.    This is day#7 of concurrent vancomycin/Zosyn antibiotic therapy.  Patient has AKI with slight improvement in SCr at 1.79 with estimated CrCl ~49. UOP was excellent overnight (2.4 ml/kg/hr on previous day).  He is now afebrile and WBC is 12.9.  He has tolerated both Vanc/Zosyn without noted complications.  No new micro data.  Goal of Therapy:  Resolution of  infection Vancomycin trough level 15-20 mcg/ml  Plan:  - cont vanc 750mg  IV q12h - cont Zosyn 3.375gm IV q8h, 4 hr infusion - monitor renal fxn, cultures, clinical course - Need to f/u planned length of therapy  Nadara Mustard, PharmD., MS Clinical Pharmacist Pager:  [phone] Thank you for allowing pharmacy to be part of this patients care team. 12/27/2012 8:57 AM

## 2012-12-27 NOTE — Progress Notes (Signed)
Per Critical Care request, Dr Butler Denmark called and updated on patients progress. Per Critical care pt appropriate for stepdown. Dr Butler Denmark informed, who said she will put the order in.

## 2012-12-27 NOTE — Progress Notes (Signed)
Speech Language Pathology Treatment: Dysphagia  Patient Details Name: Juan Glenn MRN: [id number] DOB: [birth year] Today's Date: 12/27/2012 Time: 1205-1227 SLP Time Calculation (min): 22 min  Assessment / Plan / Recommendation Clinical Impression  Pt. Seen for dysphagia treatment.  RN and wife report some coughing with liquids.  Initially moderate cues for small sips but required less cueing (minimum) throughout session.  Intermittent wet vocal quality that mostly cleared with cued cough/throat clear.  If pt. does not adhere to swallow precautions, (small sips) aspiration risk if high.  Will continue to follow for safe advancement of food/liquids.   HPI HPI: 57 yo discharged 11/19 after being treated for Enterobacter aerogenes pneumonia complicated by VDRF, returned to [facility] ED same day in respiratory distress.  Intubated. CTA chest revealed large PE.  Extubated 11/23.  Dx resp failure, acute renal failure, encephalopathy.  BSE recommends MBS to visualize swallow function for safest recommendations.    Pertinent Vitals No pain  SLP Plan  Continue with current plan of care    Recommendations Diet recommendations: Dysphagia 2 (fine chop);Nectar-thick liquid Liquids provided via: Cup;No straw Medication Administration: Whole meds with puree Supervision: Patient able to self feed;Full supervision/cueing for compensatory strategies Compensations: Slow rate;Small sips/bites;Check for pocketing Postural Changes and/or Swallow Maneuvers: Seated upright 90 degrees              Oral Care Recommendations: Oral care Q4 per protocol Follow up Recommendations:  (TBD) Plan: Continue with current plan of care    GO     Breck Coons Luciann Gossett M.Ed [employer] [phone]  12/27/2012

## 2012-12-27 NOTE — Progress Notes (Signed)
Physical Therapy Treatment Patient Details Name: Juan Glenn MRN: [id number] DOB: Mar 25, 1955 Today's Date: 12/27/2012 Time: 8413-2440 PT Time Calculation (min): 30 min  PT Assessment / Plan / Recommendation  History of Present Illness 57 yo discharged 11/19 after being treated for Enterobacter aerogenes pneumonia complicated by VDRF, returned to [facility] ED same day in respiratory distress.  Intubated. CTA chest revealed large PE. Extubated 11/23.   PT Comments   Pt progressing slowly. Primarily limited by confusion, weakness, and limited endurance. Agree with pursuing post-acute rehab due to back to back hospitalizations and profound decline in function.   Follow Up Recommendations  CIR     Does the patient have the potential to tolerate intense rehabilitation     Barriers to Discharge        Equipment Recommendations  None recommended by PT    Recommendations for Other Services    Frequency Min 3X/week   Progress towards PT Goals Progress towards PT goals: Progressing toward goals  Plan Current plan remains appropriate    Precautions / Restrictions Precautions Precautions: Fall   Pertinent Vitals/Pain HR 108-120 with activity SaO2 on 4L 97% at rest; not registering while walking (even on his ear)     Mobility  Bed Mobility Bed Mobility: Not assessed Transfers Transfers: Sit to Stand;Stand to Sit Sit to Stand: 4: Min assist;4: Min guard;With upper extremity assist Stand to Sit: 4: Min assist;With upper extremity assist;To chair/3-in-1 Details for Transfer Assistance: x 3; required less assist each time; vc forsafe use of RW; attempted sit to stand without use of UEs and unable to follow instructions for this Ambulation/Gait Ambulation/Gait Assistance: 4: Min assist Ambulation Distance (Feet): 150 Feet Assistive device: Rolling walker Ambulation/Gait Assistance Details: assist to maneuver RW; frequent cues for posture to hold head and shoulders up (tends to flex  forward and look down), pt would hold upright posture for up to 5 seconds and revert to flexion Gait Pattern: Step-through pattern;Decreased stride length;Trunk flexed Gait velocity: decreased General Gait Details: Pt c/o fatigue and general weakness during amb.    Exercises Other Exercises Other Exercises: deferred exercises due to RN's need to remove central line and transfer pt to another nursing unit   PT Diagnosis:    PT Problem List:   PT Treatment Interventions:     PT Goals (current goals can now be found in the care plan section)    Visit Information  Last PT Received On: 12/27/12 Assistance Needed: +1 History of Present Illness: 57 yo discharged 11/19 after being treated for Enterobacter aerogenes pneumonia complicated by VDRF, returned to [facility] ED same day in respiratory distress.  Intubated. CTA chest revealed large PE. Extubated 11/23.    Subjective Data  Subjective: Very few verbalizations   Cognition  Cognition Arousal/Alertness: Awake/alert Behavior During Therapy: Flat affect Overall Cognitive Status: Impaired/Different from baseline Area of Impairment: Attention;Safety/judgement Current Attention Level: Sustained Memory: Decreased short-term memory Safety/Judgement: Decreased awareness of safety Problem Solving: Slow processing;Requires verbal cues General Comments: Decr awareness (including requiring mitts bil hands to protect lines)    Balance  Static Standing Balance Static Standing - Balance Support: Bilateral upper extremity supported Static Standing - Level of Assistance: 4: Min assist  End of Session PT - End of Session Equipment Utilized During Treatment: Gait belt;Oxygen Activity Tolerance: Patient limited by fatigue Patient left: in chair;with nursing/sitter in room Nurse Communication: Mobility status   GP     Juan Glenn 12/27/2012, 5:06 PM Pager [phone]

## 2012-12-27 NOTE — Progress Notes (Signed)
[facility] Progress Note [place] TEAM 1 - Stepdown/ICU TEAM   Nichole Keltner ZHY:[id number] DOB: Jul 20, 1955 DOA: 12/18/2012 PCP: RECORD,CHARLES  Brief narrative: 57 y/o with h/o DM, CKD 3 admitted on 11/6 with seizure due to PRESS, diagnosed with aspiration pneumonia and discharged on Levaquin. Pt returned to the hospital the night after discharge with respiratory failure and was found to have b/l multifocal pulmonary emboli. Has righted sided DVT. He was intubated on admission and extubated on 11/23.   SIGNIFICANT EVENTS / STUDIES:  11/19 Respiratory distress. Intubated.  11/19 CTA chest >>> Bilateral PE, multifocal pneumonia  11/20 duplex > acute B LE DVT  11/21 echo done > EF 55-60, no right heart strain noted  11/21 with continued fevers, recultured.  11/21 1L bolus with hypotension>>improved  11/22 Renal US >> negative  11/23 Extubated   Assessment/Plan: Principal Problem:   Acute respiratory failure - due to pulm emboli superimposed on pneumonia -Had some distress yesterday-CXR reveals pulm edema -  has received Lasix and neb per pulm and placed back on Bipap -Appears to be much better today and ready for transfer to the step down  Active Problems:  Fever - low grade fevers- suspect do to aspiration  -  UA negative for UTI - c diff negative  - on Vanc and Zosyn for PNA  Pulm edema - suspect there is some diastolic component although ECHO does not report it - added Nitro paste to labetalol for better blood pressure control     Aspiration pneumonia - cont VAnc and Zosyn for HCAP    Pulmonary embolism - started Xarelto now that he has passed his swallow eval    DM (diabetes mellitus), type 2 with renal complications - resumed Amaryl when eating appropriately - also on Jentadueto at home- sugars still elevated therefore will resume Januvia     Convulsions/seizures - in setting of hypertensive emergency  HTN - has been admitted for PRESS in the  recent past - currently stable on labetalol    CKD (chronic kidney disease) stage 3, GFR 30-59 ml/min  Hypernatremia - suspected to be due to diuresis per PCCM notes sodium climbing therefore started on D5 but since developing pulm edema, have d/c'd fluids  Code Status: full code Family Communication: wife in room Disposition Plan: sdu  Consultants: PCCM   Antibiotics: ANTIBIOTICS:  Cefepime 11/19 x 1  Vancomycin 11/19 x 1  Levaquin 11/19 >>>11/21  vanco 11/22 >>  Zosyn 11/22 >>    DVT prophylaxis: Heparin infusion  HPI/Subjective: Pt alert. He has no complaints.  Objective: Blood pressure 132/82, pulse 103, temperature 97.6 F (36.4 C), temperature source Axillary, resp. rate 27, height 5\' 8"  (1.727 m), weight 88 kg (194 lb 0.1 oz), SpO2 98.00%.  Intake/Output Summary (Last 24 hours) at 12/27/12 1654 Last data filed at 12/27/12 1500  Gross per 24 hour  Intake    745 ml  Output   1425 ml  Net   -680 ml     Exam: General: sitting up in bed- no acute distress Lungs: Clear to auscultation bilaterally without wheezes or crackles- breathing at a rate of high 20s to low 30s 98% on 4 L of oxygen Cardiovascular: Regular rate and rhythm without murmur gallop or rub normal S1 and S2 Abdomen: Nontender, nondistended, soft, bowel sounds positive, no rebound, no ascites, no appreciable mass Extremities: No significant cyanosis, clubbing, or edema bilateral lower extremities  Data Reviewed: Basic Metabolic Panel:  Recent Labs Lab 12/23/12 0500 12/24/12 0220 12/25/12  1610 12/26/12 0420 12/26/12 1625 12/27/12 0334  NA 152* 155* 157* 157*  --  155*  K 3.6 4.0 3.1* 3.1* 3.6 3.7  CL 118* 120* 124* 122*  --  118*  CO2 20 24 25 26   --  26  GLUCOSE 146* 259* 237* 208*  --  202*  BUN 25* 22 17 15   --  19  CREATININE 1.65* 1.59* 1.58* 1.54*  --  1.79*  CALCIUM 8.7 8.8 8.6 8.3*  --  8.8  MG 2.4 2.4 2.1  --   --  2.2  PHOS 3.8 2.7  --   --   --  4.6   Liver Function  Tests: No results found for this basename: AST, ALT, ALKPHOS, BILITOT, PROT, ALBUMIN,  in the last 168 hours No results found for this basename: LIPASE, AMYLASE,  in the last 168 hours No results found for this basename: AMMONIA,  in the last 168 hours CBC:  Recent Labs Lab 12/23/12 0500 12/24/12 0220 12/25/12 0445 12/26/12 0420 12/27/12 0334  WBC 13.1* 12.0* 12.3* 13.2* 12.9*  HGB 9.6* 9.5* 8.9* 9.0* 10.1*  HCT 29.1* 28.3* 27.1* 27.3* 30.4*  MCV 87.7 87.6 87.4 88.1 87.4  PLT 366 405* 355 349 364   Cardiac Enzymes: No results found for this basename: CKTOTAL, CKMB, CKMBINDEX, TROPONINI,  in the last 168 hours BNP (last 3 results)  Recent Labs  12/05/12 0715 12/18/12 2151  PROBNP 85.4 342.7*   CBG:  Recent Labs Lab 12/26/12 1159 12/26/12 1545 12/26/12 2002 12/27/12 0727 12/27/12 1122  GLUCAP 328* 285* 298* 209* 376*    Recent Results (from the past 240 hour(s))  CULTURE, BLOOD (ROUTINE X 2)     Status: None   Collection Time    12/18/12 10:20 PM      Result Value Range Status   Specimen Description BLOOD LEFT ARM   Final   Special Requests BOTTLES DRAWN AEROBIC AND ANAEROBIC 9CC   Final   Culture  Setup Time     Final   Value: 12/19/2012 04:45     Performed at [facility]   Culture     Final   Value: NO GROWTH 5 DAYS     Performed at [facility]   Report Status 12/25/2012 FINAL   Final  CULTURE, BLOOD (ROUTINE X 2)     Status: None   Collection Time    12/18/12 10:30 PM      Result Value Range Status   Specimen Description BLOOD RIGHT ARM   Final   Special Requests BOTTLES DRAWN AEROBIC ONLY 10CC   Final   Culture  Setup Time     Final   Value: 12/19/2012 04:44     Performed at [facility]   Culture     Final   Value: NO GROWTH 5 DAYS     Performed at [facility]   Report Status 12/25/2012 FINAL   Final  URINE CULTURE     Status: None   Collection Time    12/18/12 11:15 PM      Result Value Range Status    Specimen Description URINE, CATHETERIZED   Final   Special Requests NONE   Final   Culture  Setup Time     Final   Value: 12/19/2012 05:13     Performed at [facility] Count     Final   Value: NO GROWTH     Performed at [facility]  Culture     Final   Value: NO GROWTH     Performed at [facility]   Report Status 12/20/2012 FINAL   Final  MRSA PCR SCREENING     Status: None   Collection Time    12/19/12  2:32 AM      Result Value Range Status   MRSA by PCR NEGATIVE  NEGATIVE Final   Comment:            The GeneXpert MRSA Assay (FDA     approved for NASAL specimens     only), is one component of a     comprehensive MRSA colonization     surveillance program. It is not     intended to diagnose MRSA     infection nor to guide or     monitor treatment for     MRSA infections.  CULTURE, RESPIRATORY (NON-EXPECTORATED)     Status: None   Collection Time    12/20/12  9:46 AM      Result Value Range Status   Specimen Description TRACHEAL ASPIRATE   Final   Special Requests NONE   Final   Gram Stain     Final   Value: FEW WBC PRESENT, PREDOMINANTLY PMN     NO SQUAMOUS EPITHELIAL CELLS SEEN     NO ORGANISMS SEEN     Performed at [facility]   Culture     Final   Value: FEW CANDIDA ALBICANS     Performed at [facility]   Report Status 12/22/2012 FINAL   Final  URINE CULTURE     Status: None   Collection Time    12/20/12  5:36 PM      Result Value Range Status   Specimen Description URINE, RANDOM   Final   Special Requests NONE   Final   Culture  Setup Time     Final   Value: 12/20/2012 22:06     Performed at [facility] Count     Final   Value: NO GROWTH     Performed at [facility]   Culture     Final   Value: NO GROWTH     Performed at [facility]   Report Status 12/21/2012 FINAL   Final  CULTURE, BLOOD (ROUTINE X 2)     Status: None   Collection Time    12/20/12  6:15 PM       Result Value Range Status   Specimen Description BLOOD LEFT HAND   Final   Special Requests BOTTLES DRAWN AEROBIC ONLY 2.5CC   Final   Culture  Setup Time     Final   Value: 12/21/2012 02:54     Performed at [facility]   Culture     Final   Value: NO GROWTH 5 DAYS     Performed at [facility]   Report Status 12/27/2012 FINAL   Final  CULTURE, BLOOD (ROUTINE X 2)     Status: None   Collection Time    12/20/12  8:34 PM      Result Value Range Status   Specimen Description BLOOD LEFT HAND   Final   Special Requests BOTTLES DRAWN AEROBIC ONLY [facility]   Final   Culture  Setup Time     Final   Value: 12/21/2012 02:54     Performed at [facility]   Culture     Final   Value:  NO GROWTH 5 DAYS     Performed at [facility]   Report Status 12/27/2012 FINAL   Final  CULTURE, RESPIRATORY (NON-EXPECTORATED)     Status: None   Collection Time    12/21/12  8:32 PM      Result Value Range Status   Specimen Description TRACHEAL ASPIRATE   Final   Special Requests NONE   Final   Gram Stain     Final   Value: RARE WBC PRESENT, PREDOMINANTLY PMN     NO SQUAMOUS EPITHELIAL CELLS SEEN     NO ORGANISMS SEEN     Performed at [facility]   Culture     Final   Value: Non-Pathogenic Oropharyngeal-type Flora Isolated.     Performed at [facility]   Report Status 12/24/2012 FINAL   Final  URINE CULTURE     Status: None   Collection Time    12/21/12  8:58 PM      Result Value Range Status   Specimen Description URINE, CATHETERIZED   Final   Special Requests NONE   Final   Culture  Setup Time     Final   Value: 12/22/2012 02:05     Performed at [facility] Count     Final   Value: NO GROWTH     Performed at [facility]   Culture     Final   Value: NO GROWTH     Performed at [facility]   Report Status 12/23/2012 FINAL   Final  CLOSTRIDIUM DIFFICILE BY PCR     Status: None   Collection Time    12/26/12 10:37  AM      Result Value Range Status   C difficile by pcr NEGATIVE  NEGATIVE Final     Studies:  Recent x-ray studies have been reviewed in detail by the Attending Physician  Scheduled Meds:  Scheduled Meds: . ipratropium  0.5 mg Nebulization Q4H   And  . albuterol  2.5 mg Nebulization Q4H  . aspirin  81 mg Oral Daily  . atorvastatin  20 mg Oral q1800  . feeding supplement (ENSURE)  1 Container Oral TID BM  . insulin aspart  0-15 Units Subcutaneous TID WC  . insulin aspart  0-5 Units Subcutaneous QHS  . insulin aspart  2 Units Subcutaneous TID WC  . insulin glargine  15 Units Subcutaneous QHS  . labetalol  300 mg Oral TID  . linagliptin  2.5 mg Oral Daily  . nitroGLYCERIN  1 inch Topical Q6H  . pantoprazole  40 mg Oral Daily  . piperacillin-tazobactam (ZOSYN)  IV  3.375 g Intravenous Q8H  . Rivaroxaban  15 mg Oral BID WC  . [START ON 01/14/2013] rivaroxaban  20 mg Oral Q supper  . vancomycin  750 mg Intravenous Q12H   Continuous Infusions: . sodium chloride 1,000 mL (12/26/12 0600)    Time spent on care of this patient: 35 min   Kegan Mckeithan, MD  [facility] Office  [phone] Pager - Text Page per Loretha Stapler as per below:  On-Call/Text Page:      [email or web address]      password [email or web address]  If 7PM-7AM, please contact night-coverage [email or web address] Password [email or web address] 12/27/2012, 4:54 PM   LOS: 9 days

## 2012-12-27 NOTE — Progress Notes (Signed)
PULMONARY  / CRITICAL CARE MEDICINE  Name: Juan Glenn MRN: [id number] DOB: 11-24-1955    ADMISSION DATE:  12/18/2012 CONSULTATION DATE:  12/18/2012  REFERRING MD :  EDP PRIMARY SERVICE:  PCCM  CHIEF COMPLAINT:  Acute respiratory failure  BRIEF PATIENT DESCRIPTION: 57 yo discharged 11/19 after being treated for Enterobacter aerogenes pneumonia complicated by VDRF, returned to [facility] ED same day in respiratory distress.  Intubated. CTA chest revealed large PE.  SIGNIFICANT EVENTS / STUDIES:  11/19  Respiratory distress. Intubated. 11/19  CTA chest >>> Bilateral PE, multifocal pneumonia 11/20 duplex > acute B LE DVT 11/21 echo done > EF 55-60, no right heart strain noted 11/21 with continued fevers, recultured.  11/21 1L bolus with hypotension>>improved 11/22 Renal US >> negative 11/23 Extubated 11/27 required BiPAP for respiratory failure  LINES / TUBES: OETT 11/19 >>>11/23 OGT 11/19 >>>11/23 Foley 11/19 >>> R IJ CVC 11/22>>>  CULTURES: 11/19 Blood >>> 11/19 Urine >>>no growth final 11/21 blood culture>>> 11/21 urine culture>>> no growth final 11/21 respiratory culture>>>few candida albicans final 11/27 pulmonary recalled due to increased WOB.  ANTIBIOTICS: Cefepime 11/19 x 1 Vancomycin 11/19 x 1 Levaquin 11/19 >>>11/21 vanco 11/22 >> Zosyn 11/22 >>   INTERVAL HISTORY: Afebrile over 24 hours.  Awake and breathing comfortably.  VITAL SIGNS: Temp:  [97.4 F (36.3 C)-99.7 F (37.6 C)] 97.6 F (36.4 C) (11/28 0417) Pulse Rate:  [86-127] 127 (11/28 0900) Resp:  [14-39] 29 (11/28 0900) BP: (105-170)/(64-99) 116/77 mmHg (11/28 0900) SpO2:  [92 %-100 %] 96 % (11/28 0928) FiO2 (%):  [40 %] 40 % (11/28 0412) Weight:  [88 kg (194 lb 0.1 oz)] 88 kg (194 lb 0.1 oz) (11/28 0500)  HEMODYNAMICS:   VENTILATOR SETTINGS: Vent Mode:  [-]  FiO2 (%):  [40 %] 40 %  INTAKE / OUTPUT: Intake/Output     11/27 0701 - 11/28 0700 11/28 0701 - 11/29 0700   P.O. 360    I.V. (mL/kg) 540 (6.1) 20 (0.2)   IV Piggyback 625 200   Total Intake(mL/kg) 1525 (17.3) 220 (2.5)   Urine (mL/kg/hr) 5075 (2.4)    Stool 2 (0)    Total Output 5077     Net -3552 +220         PHYSICAL EXAMINATION:  General:  Awake, breathing comfortably and in no respiratory distress. Neuro:  RASS +2, follows commands. HEENT: AT/[place], [place] in place Cardiovascular: Regular rate and rhythm Lungs:  Bibasilar crackles Abdomen:  Soft, nontender, hyperactive BS, mildly distended Musculoskeletal:  Moves all extremities, trace LE edema bilaterally, symmetric Skin:  Intact  LABS:  CBC  Recent Labs Lab 12/25/12 0445 12/26/12 0420 12/27/12 0334  WBC 12.3* 13.2* 12.9*  HGB 8.9* 9.0* 10.1*  HCT 27.1* 27.3* 30.4*  PLT 355 349 364   Coag's No results found for this basename: APTT, INR,  in the last 168 hours BMET  Recent Labs Lab 12/25/12 0951 12/26/12 0420 12/26/12 1625 12/27/12 0334  NA 157* 157*  --  155*  K 3.1* 3.1* 3.6 3.7  CL 124* 122*  --  118*  CO2 25 26  --  26  BUN 17 15  --  19  CREATININE 1.58* 1.54*  --  1.79*  GLUCOSE 237* 208*  --  202*   Electrolytes  Recent Labs Lab 12/23/12 0500 12/24/12 0220 12/25/12 0951 12/26/12 0420 12/27/12 0334  CALCIUM 8.7 8.8 8.6 8.3* 8.8  MG 2.4 2.4 2.1  --  2.2  PHOS 3.8 2.7  --   --  4.6   Sepsis Markers No results found for this basename: LATICACIDVEN, PROCALCITON, O2SATVEN,  in the last 168 hours  ABG  Recent Labs Lab 12/26/12 0549  PHART 7.467*  PCO2ART 35.6  PO2ART 61.6*   Liver Enzymes No results found for this basename: AST, ALT, ALKPHOS, BILITOT, ALBUMIN,  in the last 168 hours  Cardiac Enzymes No results found for this basename: TROPONINI, PROBNP,  in the last 168 hours  Glucose  Recent Labs Lab 12/25/12 2200 12/26/12 0753 12/26/12 1159 12/26/12 1545 12/26/12 2002 12/27/12 0727  GLUCAP 218* 177* 328* 285* 298* 209*   CXR:  11/24 Improved aeration bilaterally, some residual  atelectasis/infiltrate bilateral bases  ASSESSMENT / PLAN:  PULMONARY A:  Acute respiratory failure in setting of bilateral pulmonary embolism.   Recent Enterobacter aerogenes pneumonia. Suspected new HCAP TTE with no right heart strain or elevated P pressure P:   - Extubated 11/23. - Change BiPAP to PRN at this point. - Recommend keeping as dry as possible. - Bronchodilators as ordered. - Anticoagulation as below. - Abx per ID section.  CARDIOVASCULAR A: Hypertension B PE P:  - Goal MAP>60. - Preadmission Labetalol and ASA reordered. - Increased labetalol 300 TID.  RENAL A:  Acute renal failure, non-oliguric; suspect ATN following transient shock; CVP 10 Renal US negative Hypernatremic P:   - Follow CVP, goal > 10. - Trend BMP. - NS@kvo . - Keep as dry as possible.  GASTROINTESTINAL A:  No issues. P:   - Diet as ordered.  HEMATOLOGIC A:  Acute pulmonary embolism. Anemia, likely dilutional with no active bleed P:  - Trend CBC / APTT. - Continue heparin per Pharmacy.  INFECTIOUS A:  Recovering from aspiration / enterobacter pneumonia.  Completed tx 11/21 with 14 d abx for enterobacter PNA  Consider new HCAP, consider pulm infarct P:   - F/u cultures.  - Continue vanc/zosyn started 11/22 and would finish an 8 day course, stop date in place.  ENDOCRINE  A:  DMII.  Hyperglycemia P:   - Started lantus and changed to phase 3 SSI. - Hold Amaryl, Jentadueto  NEUROLOGIC A:  Acute encephalopathy.  Resolved. P:   - Monitor.  Much more stable from a respiratory standpoint this AM, ok to transfer to SDU, stop date for abx in place, continue anticoag, PCCM will sign off, please call back if needed.  Alyson Reedy, M.D. [facility] Pulmonary/Critical Care Medicine. Pager: [phone]. After hours pager: [phone].

## 2012-12-28 ENCOUNTER — Encounter (HOSPITAL_COMMUNITY): Payer: Self-pay | Admitting: *Deleted

## 2012-12-28 LAB — BASIC METABOLIC PANEL
BUN: 24 mg/dL — ABNORMAL HIGH (ref 6–23)
CO2: 24 mEq/L (ref 19–32)
Calcium: 9.2 mg/dL (ref 8.4–10.5)
Chloride: 120 mEq/L — ABNORMAL HIGH (ref 96–112)
Creatinine, Ser: 1.73 mg/dL — ABNORMAL HIGH (ref 0.50–1.35)
Glucose, Bld: 356 mg/dL — ABNORMAL HIGH (ref 70–99)
Sodium: 157 mEq/L — ABNORMAL HIGH (ref 135–145)

## 2012-12-28 LAB — GLUCOSE, CAPILLARY
Glucose-Capillary: 307 mg/dL — ABNORMAL HIGH (ref 70–99)
Glucose-Capillary: 238 mg/dL — ABNORMAL HIGH (ref 70–99)
Glucose-Capillary: 174 mg/dL — ABNORMAL HIGH (ref 70–99)

## 2012-12-28 LAB — CBC
Hemoglobin: 10.3 g/dL — ABNORMAL LOW (ref 13.0–17.0)
MCHC: 32.1 g/dL (ref 30.0–36.0)
RDW: 15.2 % (ref 11.5–15.5)
WBC: 11.6 10*3/uL — ABNORMAL HIGH (ref 4.0–10.5)

## 2012-12-28 MED ORDER — HALOPERIDOL LACTATE 5 MG/ML IJ SOLN: 2.0000 mg | Freq: Four times a day (QID) | INTRAMUSCULAR | Status: DC | PRN

## 2012-12-28 MED ORDER — AMLODIPINE BESYLATE 10 MG PO TABS
10.0000 mg | ORAL_TABLET | Freq: Every day | ORAL | Status: DC
  Administered 2012-12-28 – 2013-01-01 (×5): 10 mg via ORAL
  Filled 2012-12-28 (×5): qty 1

## 2012-12-28 MED ORDER — IPRATROPIUM BROMIDE 0.02 % IN SOLN
0.5000 mg | Freq: Four times a day (QID) | RESPIRATORY_TRACT | Status: DC
  Administered 2012-12-28 – 2012-12-30 (×5): 0.5 mg via RESPIRATORY_TRACT
  Filled 2012-12-28 (×5): qty 2.5

## 2012-12-28 MED ORDER — ALBUTEROL SULFATE (5 MG/ML) 0.5% IN NEBU: 2.5000 mg | INHALATION_SOLUTION | Freq: Four times a day (QID) | RESPIRATORY_TRACT | Status: DC

## 2012-12-28 MED ORDER — INSULIN ASPART 100 UNIT/ML ~~LOC~~ SOLN
4.0000 [IU] | Freq: Three times a day (TID) | SUBCUTANEOUS | Status: DC
  Administered 2012-12-28 – 2012-12-29 (×5): 4 [IU] via SUBCUTANEOUS

## 2012-12-28 MED ORDER — INSULIN GLARGINE 100 UNIT/ML ~~LOC~~ SOLN
20.0000 [IU] | Freq: Every day | SUBCUTANEOUS | Status: DC
  Administered 2012-12-28 – 2012-12-29 (×2): 20 [IU] via SUBCUTANEOUS
  Filled 2012-12-28 (×3): qty 0.2

## 2012-12-28 MED ORDER — ALBUTEROL SULFATE (5 MG/ML) 0.5% IN NEBU
2.5000 mg | INHALATION_SOLUTION | Freq: Four times a day (QID) | RESPIRATORY_TRACT | Status: DC
  Administered 2012-12-28 – 2012-12-30 (×5): 2.5 mg via RESPIRATORY_TRACT
  Filled 2012-12-28 (×5): qty 0.5

## 2012-12-28 MED ORDER — LABETALOL HCL 300 MG PO TABS
300.0000 mg | ORAL_TABLET | Freq: Two times a day (BID) | ORAL | Status: DC
  Administered 2012-12-29 – 2013-01-01 (×8): 300 mg via ORAL
  Filled 2012-12-28 (×10): qty 1

## 2012-12-28 MED ORDER — ALBUTEROL SULFATE (5 MG/ML) 0.5% IN NEBU: 2.5000 mg | INHALATION_SOLUTION | RESPIRATORY_TRACT | Status: DC | PRN

## 2012-12-28 NOTE — Progress Notes (Signed)
RNs went to room to do shift report. Pt had unscrewed his IV tubing despite being covered with kerlex and bled a large amount onto his gown and bed. IV clamped and patient cleaned. Green mittens placed for safety. Will continue to monitor.

## 2012-12-28 NOTE — Progress Notes (Signed)
[facility] Progress Note [place] TEAM 1 - Stepdown/ICU TEAM   Juan Glenn RUE:[id number] DOB: 1955-05-12 DOA: 12/18/2012 PCP: RECORD,CHARLES  Brief narrative: 57 y/o with h/o DM, CKD 3 admitted on 11/6 with seizure due to PRESS. Also diagnosed with aspiration pneumonia and discharged by the ICU service on Levaquin.  Pt returned to the hospital the night after discharge with respiratory failure and was found to have b/l multifocal pulmonary emboli. Has a righted sided DVT.  He was intubated on admission and extubated on 11/23.  He was transferred to the hospitalist service on 11/25 but due to recurrent aspiration and then pulm edema, he was not moved out of the ICU until 11/28.   Although his resp status is now improving, his mental status has not improved much- he is less restless today but continues to be poorly conversant and has poor memory.   SIGNIFICANT EVENTS / STUDIES:  11/19 Respiratory distress. Intubated.  11/19 CTA chest >>> Bilateral PE, multifocal pneumonia  11/20 duplex > acute B LE DVT  11/21 echo done > EF 55-60, no right heart strain noted  11/21 with continued fevers, recultured.  11/21 1L bolus with hypotension>>improved  11/22 Renal US >> negative  11/23 Extubated 11/26- increased O2 requirements/ abdominal breathing and fevers- most likely repeat aspiration  11/27- pulm edema- diuresed 11/28- transferred to step down- did not require BiPAP over night but did need multiple neb treatments    Assessment/Plan: Principal Problem:   Acute respiratory failure - due to pulm emboli superimposed on pneumonia -Had some recurrent resp distress -CXR revealed pulm edema -  has received Lasix and neb per pulm and placed back on Bipap -Appears to be significantly improved and down to 3 L of O2  Active Problems:  Fever - low grade fevers- suspect do to aspiration  -  UA negative for UTI - c diff negative  - on Vanc and Zosyn for PNA  Pulm edema -  suspect there is some diastolic component although ECHO does not report it - diuresed appropriately- watch carefully for recurrence - f/u CXR tomorrow    Aspiration pneumonia - had recurrent episodes of aspiration and resultant hypoxia and fevers as recent as 3 days ago - cont VAnc and Zosyn for HCAP x 10 days - follow for further episodes - currently receiving nectar thick liquids.     Pulmonary embolism - started Xarelto now that he has passed his swallow eval    DM (diabetes mellitus), type 2 with renal complications - resumed Amaryl when eating appropriately - also on Jentadueto at home- sugars still elevated therefore will resume Januvia     Convulsions/seizures - in setting of hypertensive emergency  HTN -  admitted for PRESS with a seizure  - currently stable on labetalol- will decrease dose from 300 TID to 300 BID (was last discharged on 200 BID)  - add Norvasc to improve control    CKD (chronic kidney disease) stage 3, GFR 30-59 ml/min - stable  Hypernatremia - suspected to be due to diuresis per PCCM notes - sodium climbing therefore, started on D5 --- developed pulm edema -- d/c'd fluids diuresed again - recheck sodium in AM  Encephalopathy - appears to have started after seizure/ PRESS  - very poor memory and at times impulsive -  cont to follow for improvement-   Code Status: full code Family Communication: wife in room Disposition Plan: transfer to tele  Consultants: PCCM   Antibiotics: ANTIBIOTICS:  Cefepime 11/19 x 1  Vancomycin 11/19 x 1  Levaquin 11/19 >>>11/21  vanco 11/22 >>  Zosyn 11/22 >>    DVT prophylaxis: Heparin infusion  HPI/Subjective: Pt alert and sitting up on a commode- following nurses commands to assist in cleaning himself- has no complaints- aware that he is at [facility] but thinks he was admitted yesterday.   Objective: Blood pressure 145/86, pulse 95, temperature 98.7 F (37.1 C), temperature source Oral, resp. rate  22, height 5\' 9"  (1.753 m), weight 85.1 kg (187 lb 9.8 oz), SpO2 96.00%.  Intake/Output Summary (Last 24 hours) at 12/28/12 1011 Last data filed at 12/28/12 0334  Gross per 24 hour  Intake    320 ml  Output   1675 ml  Net  -1355 ml     Exam: General:  Awake and alert, no acute distress Lungs: Clear to auscultation bilaterally without wheezes or crackles- breathing at a rate has improved to 20s 98% on 3 L of oxygen Cardiovascular: Regular rate and rhythm without murmur gallop or rub normal S1 and S2 Abdomen: Nontender, nondistended, soft, bowel sounds positive, no rebound, no ascites, no appreciable mass Extremities: No significant cyanosis, clubbing, or edema bilateral lower extremities Psych: flat affect, eye contact fair, answers with mostly one word responses  Data Reviewed: Basic Metabolic Panel:  Recent Labs Lab 12/23/12 0500 12/24/12 0220 12/25/12 0951 12/26/12 0420 12/26/12 1625 12/27/12 0334  NA 152* 155* 157* 157*  --  155*  K 3.6 4.0 3.1* 3.1* 3.6 3.7  CL 118* 120* 124* 122*  --  118*  CO2 20 24 25 26   --  26  GLUCOSE 146* 259* 237* 208*  --  202*  BUN 25* 22 17 15   --  19  CREATININE 1.65* 1.59* 1.58* 1.54*  --  1.79*  CALCIUM 8.7 8.8 8.6 8.3*  --  8.8  MG 2.4 2.4 2.1  --   --  2.2  PHOS 3.8 2.7  --   --   --  4.6   Liver Function Tests: No results found for this basename: AST, ALT, ALKPHOS, BILITOT, PROT, ALBUMIN,  in the last 168 hours No results found for this basename: LIPASE, AMYLASE,  in the last 168 hours No results found for this basename: AMMONIA,  in the last 168 hours CBC:  Recent Labs Lab 12/24/12 0220 12/25/12 0445 12/26/12 0420 12/27/12 0334 12/28/12 0514  WBC 12.0* 12.3* 13.2* 12.9* 11.6*  HGB 9.5* 8.9* 9.0* 10.1* 10.3*  HCT 28.3* 27.1* 27.3* 30.4* 32.1*  MCV 87.6 87.4 88.1 87.4 89.7  PLT 405* 355 349 364 312   Cardiac Enzymes: No results found for this basename: CKTOTAL, CKMB, CKMBINDEX, TROPONINI,  in the last 168 hours BNP  (last 3 results)  Recent Labs  12/05/12 0715 12/18/12 2151  PROBNP 85.4 342.7*   CBG:  Recent Labs Lab 12/27/12 1122 12/27/12 1628 12/27/12 1755 12/27/12 2135 12/28/12 0836  GLUCAP 376* 257* 253* 292* 219*    Recent Results (from the past 240 hour(s))  CULTURE, BLOOD (ROUTINE X 2)     Status: None   Collection Time    12/18/12 10:20 PM      Result Value Range Status   Specimen Description BLOOD LEFT ARM   Final   Special Requests BOTTLES DRAWN AEROBIC AND ANAEROBIC [facility]   Final   Culture  Setup Time     Final   Value: 12/19/2012 04:45     Performed at [facility]   Culture     Final  Value: NO GROWTH 5 DAYS     Performed at [facility]   Report Status 12/25/2012 FINAL   Final  CULTURE, BLOOD (ROUTINE X 2)     Status: None   Collection Time    12/18/12 10:30 PM      Result Value Range Status   Specimen Description BLOOD RIGHT ARM   Final   Special Requests BOTTLES DRAWN AEROBIC ONLY 10CC   Final   Culture  Setup Time     Final   Value: 12/19/2012 04:44     Performed at [facility]   Culture     Final   Value: NO GROWTH 5 DAYS     Performed at [facility]   Report Status 12/25/2012 FINAL   Final  URINE CULTURE     Status: None   Collection Time    12/18/12 11:15 PM      Result Value Range Status   Specimen Description URINE, CATHETERIZED   Final   Special Requests NONE   Final   Culture  Setup Time     Final   Value: 12/19/2012 05:13     Performed at [facility] Count     Final   Value: NO GROWTH     Performed at [facility]   Culture     Final   Value: NO GROWTH     Performed at [facility]   Report Status 12/20/2012 FINAL   Final  MRSA PCR SCREENING     Status: None   Collection Time    12/19/12  2:32 AM      Result Value Range Status   MRSA by PCR NEGATIVE  NEGATIVE Final   Comment:            The GeneXpert MRSA Assay (FDA     approved for NASAL specimens     only), is one  component of a     comprehensive MRSA colonization     surveillance program. It is not     intended to diagnose MRSA     infection nor to guide or     monitor treatment for     MRSA infections.  CULTURE, RESPIRATORY (NON-EXPECTORATED)     Status: None   Collection Time    12/20/12  9:46 AM      Result Value Range Status   Specimen Description TRACHEAL ASPIRATE   Final   Special Requests NONE   Final   Gram Stain     Final   Value: FEW WBC PRESENT, PREDOMINANTLY PMN     NO SQUAMOUS EPITHELIAL CELLS SEEN     NO ORGANISMS SEEN     Performed at [facility]   Culture     Final   Value: FEW CANDIDA ALBICANS     Performed at [facility]   Report Status 12/22/2012 FINAL   Final  URINE CULTURE     Status: None   Collection Time    12/20/12  5:36 PM      Result Value Range Status   Specimen Description URINE, RANDOM   Final   Special Requests NONE   Final   Culture  Setup Time     Final   Value: 12/20/2012 22:06     Performed at [facility]     Final   Value: NO GROWTH     Performed at [facility]  Final   Value: NO GROWTH     Performed at [facility]   Report Status 12/21/2012 FINAL   Final  CULTURE, BLOOD (ROUTINE X 2)     Status: None   Collection Time    12/20/12  6:15 PM      Result Value Range Status   Specimen Description BLOOD LEFT HAND   Final   Special Requests BOTTLES DRAWN AEROBIC ONLY 2.5CC   Final   Culture  Setup Time     Final   Value: 12/21/2012 02:54     Performed at [facility]   Culture     Final   Value: NO GROWTH 5 DAYS     Performed at [facility]   Report Status 12/27/2012 FINAL   Final  CULTURE, BLOOD (ROUTINE X 2)     Status: None   Collection Time    12/20/12  8:34 PM      Result Value Range Status   Specimen Description BLOOD LEFT HAND   Final   Special Requests BOTTLES DRAWN AEROBIC ONLY [facility]   Final   Culture  Setup Time     Final   Value: 12/21/2012  02:54     Performed at [facility]   Culture     Final   Value: NO GROWTH 5 DAYS     Performed at [facility]   Report Status 12/27/2012 FINAL   Final  CULTURE, RESPIRATORY (NON-EXPECTORATED)     Status: None   Collection Time    12/21/12  8:32 PM      Result Value Range Status   Specimen Description TRACHEAL ASPIRATE   Final   Special Requests NONE   Final   Gram Stain     Final   Value: RARE WBC PRESENT, PREDOMINANTLY PMN     NO SQUAMOUS EPITHELIAL CELLS SEEN     NO ORGANISMS SEEN     Performed at [facility]   Culture     Final   Value: Non-Pathogenic Oropharyngeal-type Flora Isolated.     Performed at [facility]   Report Status 12/24/2012 FINAL   Final  URINE CULTURE     Status: None   Collection Time    12/21/12  8:58 PM      Result Value Range Status   Specimen Description URINE, CATHETERIZED   Final   Special Requests NONE   Final   Culture  Setup Time     Final   Value: 12/22/2012 02:05     Performed at [facility]     Final   Value: NO GROWTH     Performed at [facility]   Culture     Final   Value: NO GROWTH     Performed at [facility]   Report Status 12/23/2012 FINAL   Final  CLOSTRIDIUM DIFFICILE BY PCR     Status: None   Collection Time    12/26/12 10:37 AM      Result Value Range Status   C difficile by pcr NEGATIVE  NEGATIVE Final     Studies:  Recent x-ray studies have been reviewed in detail by the Attending Physician  Scheduled Meds:  Scheduled Meds: . ipratropium  0.5 mg Nebulization Q4H   And  . albuterol  2.5 mg Nebulization Q4H  . amLODipine  10 mg Oral Daily  . aspirin  81 mg Oral Daily  . atorvastatin  20 mg  Oral q1800  . feeding supplement (ENSURE)  1 Container Oral TID BM  . insulin aspart  0-15 Units Subcutaneous TID WC  . insulin aspart  0-5 Units Subcutaneous QHS  . insulin aspart  4 Units Subcutaneous TID WC  . insulin glargine  20 Units  Subcutaneous QHS  . labetalol  300 mg Oral BID  . linagliptin  2.5 mg Oral Daily  . pantoprazole  40 mg Oral Daily  . Rivaroxaban  15 mg Oral BID WC  . [START ON 01/14/2013] rivaroxaban  20 mg Oral Q supper  . vancomycin  750 mg Intravenous Q12H   Continuous Infusions: . sodium chloride 1,000 mL (12/26/12 0600)    Time spent on care of this patient: 35 min   Alyric Parkin, MD  [facility] Office  [phone] Pager - Text Page per Loretha Stapler as per below:  On-Call/Text Page:      [email or web address]      password [email or web address]  If 7PM-7AM, please contact night-coverage [email or web address] Password [email or web address] 12/28/2012, 10:11 AM   LOS: 10 days

## 2012-12-28 NOTE — Progress Notes (Signed)
Patient was transferred from 3S connected tele and continuous pulse ox. Skin intact. Orient patient and family to room.

## 2012-12-29 ENCOUNTER — Inpatient Hospital Stay (HOSPITAL_COMMUNITY): Payer: BC Managed Care – PPO

## 2012-12-29 LAB — BASIC METABOLIC PANEL
Chloride: 121 mEq/L — ABNORMAL HIGH (ref 96–112)
Creatinine, Ser: 1.58 mg/dL — ABNORMAL HIGH (ref 0.50–1.35)
GFR calc Af Amer: 54 mL/min — ABNORMAL LOW (ref >90)
Glucose, Bld: 326 mg/dL — ABNORMAL HIGH (ref 70–99)
Potassium: 3.9 mEq/L (ref 3.5–5.1)
Sodium: 158 mEq/L — ABNORMAL HIGH (ref 135–145)

## 2012-12-29 LAB — GLUCOSE, CAPILLARY: Glucose-Capillary: 267 mg/dL — ABNORMAL HIGH (ref 70–99)

## 2012-12-29 MED ORDER — DEXTROSE 5 % IV SOLN
INTRAVENOUS | Status: DC
  Administered 2012-12-29 – 2012-12-30 (×3): via INTRAVENOUS

## 2012-12-29 NOTE — Progress Notes (Signed)
RN went to room after seeing that pt was trying to take off green mittens. RN explained to patient that mittens were for his own safety and asked pt if he knew where he was. He stated that he thought he was at [facility]. He was unsure of his situation and the time but remembered that he was recently hospitalized. He asked for water, and thickened water was given to him. He denied any pain or discomfort. His situation and plan of care was reviewed with him and he verbalized understanding. Previously this shift pt was agitated and only oriented to self. Will continue to monitor.

## 2012-12-29 NOTE — Progress Notes (Signed)
[facility] Progress Note   Juan Glenn ONG:[id number] DOB: 07-Apr-1955 DOA: 12/18/2012 PCP: RECORD,CHARLES  Brief narrative: 57 y/o with h/o DM, CKD 3 admitted on 11/6 with seizure due to PRESS. Also diagnosed with aspiration pneumonia and discharged by the ICU service on Levaquin.  Pt returned to the hospital the night after discharge with respiratory failure and was found to have b/l multifocal pulmonary emboli. Has a righted sided DVT.  He was intubated on admission and extubated on 11/23.  He was transferred to the hospitalist service on 11/25 but due to recurrent aspiration and then pulm edema, he was not moved out of the ICU until 11/28.   Although his resp status is now improving, his mental status has not improved much- he is less restless today but continues to be poorly conversant and has poor memory.   SIGNIFICANT EVENTS / STUDIES:  11/19 Respiratory distress. Intubated.  11/19 CTA chest >>> Bilateral PE, multifocal pneumonia  11/20 duplex > acute B LE DVT  11/21 echo done > EF 55-60, no right heart strain noted  11/21 with continued fevers, recultured.  11/21 1L bolus with hypotension>>improved  11/22 Renal US >> negative  11/23 Extubated 11/26- increased O2 requirements/ abdominal breathing and fevers- most likely repeat aspiration  11/27- pulm edema- diuresed 11/28- transferred to step down- did not require BiPAP over night but did need multiple neb treatments    Assessment/Plan:  Acute respiratory failure -Due to pulm emboli superimposed on pneumonia -Had some recurrent resp distress -CXR revealed pulm edema -  has received Lasix and neb per pulm and placed back on Bipap -Appears to be significantly improved and down to 3 L of O2  Hypernatremia -This is likely secondary to overdiuresis. -We'll start a slow rate of D5W, if patient develops shortness of breath this should be discontinued and Lasix reinstituted. -Check chest x-ray  Fever - low  grade fevers- suspect do to aspiration  -  UA negative for UTI - c diff negative  - on Vanc and Zosyn for PNA  Pulm edema - suspect there is some diastolic component although ECHO does not report it - diuresed appropriately- watch carefully for recurrence - f/u CXR.    Aspiration pneumonia - had recurrent episodes of aspiration and resultant hypoxia and fevers as recent as 3 days ago - cont VAnc and Zosyn for HCAP x 10 days - follow for further episodes - currently receiving nectar thick liquids.     Pulmonary embolism - started Xarelto now that he has passed his swallow eval    DM (diabetes mellitus), type 2 with renal complications - resumed Amaryl when eating appropriately - also on Jentadueto at home- sugars still elevated therefore will resume Januvia     Convulsions/seizures - in setting of hypertensive emergency  HTN -  admitted for PRESS with a seizure  - currently stable on labetalol- will decrease dose from 300 TID to 300 BID (was last discharged on 200 BID)  - add Norvasc to improve control    CKD (chronic kidney disease) stage 3, GFR 30-59 ml/min - stable  Encephalopathy - appears to have started after seizure/ PRESS  - very poor memory and at times impulsive -  cont to follow for improvement-   Code Status: full code Family Communication: wife in room Disposition Plan:  Consultants: PCCM   Antibiotics: ANTIBIOTICS:  Cefepime 11/19 x 1  Vancomycin 11/19 x 1  Levaquin 11/19 >>>11/21  vanco 11/22 >>  Zosyn 11/22 >>    DVT  prophylaxis: Heparin infusion  HPI/Subjective: Pt alert and sitting up on a commode- following nurses commands to assist in cleaning himself- has no complaints- aware that he is at [facility] but thinks he was admitted yesterday.   Objective: Blood pressure 151/87, pulse 85, temperature 98.8 F (37.1 C), temperature source Oral, resp. rate 20, height 5\' 9"  (1.753 m), weight 85.1 kg (187 lb 9.8 oz), SpO2  96.00%.  Intake/Output Summary (Last 24 hours) at 12/29/12 1030 Last data filed at 12/29/12 0815  Gross per 24 hour  Intake 1137.33 ml  Output    300 ml  Net 837.33 ml     Exam: General:  Awake and alert, no acute distress Lungs: Clear to auscultation bilaterally without wheezes or crackles- breathing at a rate has improved to 20s 98% on 3 L of oxygen Cardiovascular: Regular rate and rhythm without murmur gallop or rub normal S1 and S2 Abdomen: Nontender, nondistended, soft, bowel sounds positive, no rebound, no ascites, no appreciable mass Extremities: No significant cyanosis, clubbing, or edema bilateral lower extremities Psych: flat affect, eye contact fair, answers with mostly one word responses  Data Reviewed: Basic Metabolic Panel:  Recent Labs Lab 12/23/12 0500 12/24/12 0220 12/25/12 0951 12/26/12 0420 12/26/12 1625 12/27/12 0334 12/28/12 1350 12/29/12 0545  NA 152* 155* 157* 157*  --  155* 157* 158*  K 3.6 4.0 3.1* 3.1* 3.6 3.7 4.0 3.9  CL 118* 120* 124* 122*  --  118* 120* 121*  CO2 20 24 25 26   --  26 24 25   GLUCOSE 146* 259* 237* 208*  --  202* 356* 326*  BUN 25* 22 17 15   --  19 24* 19  CREATININE 1.65* 1.59* 1.58* 1.54*  --  1.79* 1.73* 1.58*  CALCIUM 8.7 8.8 8.6 8.3*  --  8.8 9.2 9.0  MG 2.4 2.4 2.1  --   --  2.2  --   --   PHOS 3.8 2.7  --   --   --  4.6  --   --    Liver Function Tests: No results found for this basename: AST, ALT, ALKPHOS, BILITOT, PROT, ALBUMIN,  in the last 168 hours No results found for this basename: LIPASE, AMYLASE,  in the last 168 hours No results found for this basename: AMMONIA,  in the last 168 hours CBC:  Recent Labs Lab 12/24/12 0220 12/25/12 0445 12/26/12 0420 12/27/12 0334 12/28/12 0514  WBC 12.0* 12.3* 13.2* 12.9* 11.6*  HGB 9.5* 8.9* 9.0* 10.1* 10.3*  HCT 28.3* 27.1* 27.3* 30.4* 32.1*  MCV 87.6 87.4 88.1 87.4 89.7  PLT 405* 355 349 364 312   Cardiac Enzymes: No results found for this basename: CKTOTAL,  CKMB, CKMBINDEX, TROPONINI,  in the last 168 hours BNP (last 3 results)  Recent Labs  12/05/12 0715 12/18/12 2151  PROBNP 85.4 342.7*   CBG:  Recent Labs Lab 12/28/12 0836 12/28/12 1240 12/28/12 1722 12/28/12 2148 12/29/12 0743  GLUCAP 219* 307* 238* 174* 267*    Recent Results (from the past 240 hour(s))  CULTURE, RESPIRATORY (NON-EXPECTORATED)     Status: None   Collection Time    12/20/12  9:46 AM      Result Value Range Status   Specimen Description TRACHEAL ASPIRATE   Final   Special Requests NONE   Final   Gram Stain     Final   Value: FEW WBC PRESENT, PREDOMINANTLY PMN     NO SQUAMOUS EPITHELIAL CELLS SEEN     NO ORGANISMS  SEEN     Performed at [facility]   Culture     Final   Value: FEW CANDIDA ALBICANS     Performed at [facility]   Report Status 12/22/2012 FINAL   Final  URINE CULTURE     Status: None   Collection Time    12/20/12  5:36 PM      Result Value Range Status   Specimen Description URINE, RANDOM   Final   Special Requests NONE   Final   Culture  Setup Time     Final   Value: 12/20/2012 22:06     Performed at [facility] Count     Final   Value: NO GROWTH     Performed at [facility]   Culture     Final   Value: NO GROWTH     Performed at [facility]   Report Status 12/21/2012 FINAL   Final  CULTURE, BLOOD (ROUTINE X 2)     Status: None   Collection Time    12/20/12  6:15 PM      Result Value Range Status   Specimen Description BLOOD LEFT HAND   Final   Special Requests BOTTLES DRAWN AEROBIC ONLY 2.5CC   Final   Culture  Setup Time     Final   Value: 12/21/2012 02:54     Performed at [facility]   Culture     Final   Value: NO GROWTH 5 DAYS     Performed at [facility]   Report Status 12/27/2012 FINAL   Final  CULTURE, BLOOD (ROUTINE X 2)     Status: None   Collection Time    12/20/12  8:34 PM      Result Value Range Status   Specimen Description BLOOD  LEFT HAND   Final   Special Requests BOTTLES DRAWN AEROBIC ONLY [facility]   Final   Culture  Setup Time     Final   Value: 12/21/2012 02:54     Performed at [facility]   Culture     Final   Value: NO GROWTH 5 DAYS     Performed at [facility]   Report Status 12/27/2012 FINAL   Final  CULTURE, RESPIRATORY (NON-EXPECTORATED)     Status: None   Collection Time    12/21/12  8:32 PM      Result Value Range Status   Specimen Description TRACHEAL ASPIRATE   Final   Special Requests NONE   Final   Gram Stain     Final   Value: RARE WBC PRESENT, PREDOMINANTLY PMN     NO SQUAMOUS EPITHELIAL CELLS SEEN     NO ORGANISMS SEEN     Performed at [facility]   Culture     Final   Value: Non-Pathogenic Oropharyngeal-type Flora Isolated.     Performed at [facility]   Report Status 12/24/2012 FINAL   Final  URINE CULTURE     Status: None   Collection Time    12/21/12  8:58 PM      Result Value Range Status   Specimen Description URINE, CATHETERIZED   Final   Special Requests NONE   Final   Culture  Setup Time     Final   Value: 12/22/2012 02:05     Performed at [facility] Count     Final   Value: NO GROWTH  Performed at [facility]     Final   Value: NO GROWTH     Performed at [facility]   Report Status 12/23/2012 FINAL   Final  CLOSTRIDIUM DIFFICILE BY PCR     Status: None   Collection Time    12/26/12 10:37 AM      Result Value Range Status   C difficile by pcr NEGATIVE  NEGATIVE Final     Studies:  Recent x-ray studies have been reviewed in detail by the Attending Physician  Scheduled Meds:  Scheduled Meds: . albuterol  2.5 mg Nebulization QID   And  . ipratropium  0.5 mg Nebulization QID  . amLODipine  10 mg Oral Daily  . aspirin  81 mg Oral Daily  . atorvastatin  20 mg Oral q1800  . feeding supplement (ENSURE)  1 Container Oral TID BM  . insulin aspart  0-15 Units Subcutaneous TID WC  .  insulin aspart  0-5 Units Subcutaneous QHS  . insulin aspart  4 Units Subcutaneous TID WC  . insulin glargine  20 Units Subcutaneous QHS  . labetalol  300 mg Oral BID  . linagliptin  2.5 mg Oral Daily  . pantoprazole  40 mg Oral Daily  . Rivaroxaban  15 mg Oral BID WC  . [START ON 01/14/2013] rivaroxaban  20 mg Oral Q supper  . vancomycin  750 mg Intravenous Q12H   Continuous Infusions: . sodium chloride 1,000 mL (12/28/12 1600)    Time spent on care of this patient: 35 min   Ketzaly Cardella A, MD  [facility] Office  [phone] Pager - Text Page per Loretha Stapler as per below:  On-Call/Text Page:      [email or web address]      password [email or web address]  If 7PM-7AM, please contact night-coverage [email or web address] Password [email or web address] 12/29/2012, 10:30 AM   LOS: 11 days

## 2012-12-29 NOTE — Plan of Care (Signed)
Problem: Phase I Progression Outcomes Goal: Pain controlled Outcome: Completed/Met Date Met:  12/29/12 Pt has had no complaints of pain this shift

## 2012-12-29 NOTE — Progress Notes (Signed)
NURSING PROGRESS NOTE  Juan Glenn [id number] Admission Data: 12/28/12 Prior to this RNs arrival Attending Provider: Calvert Cantor, MD NWG:[id number] Code Status: Full  Juan Glenn is a 58 y.o. male patient admitted from ED:  -No acute distress noted.  -No complaints of shortness of breath.  -No complaints of chest pain.   Cardiac Monitoring: Box #  in place. Cardiac monitor yields:sinus tachycardia.  Blood pressure 151/87, pulse 85, temperature 98.8 F (37.1 C), temperature source Oral, resp. rate 20, height 5\' 9"  (1.753 m), weight 85.1 kg (187 lb 9.8 oz), SpO2 96.00%.   IV Fluids:  IV in place, occlusive dsg intact without redness, IV cath forearm right, pt unscrewed IV. IV clamped and removed. New IV placed in right hand at 2105 with NS @KVO   Allergies:  Review of patient's allergies indicates no known allergies.  Past Medical History:   has a past medical history of Diabetes mellitus without complication (2009); Renal disorder; Hyperlipidemia; and Difficult intubation.  Past Surgical History:   has past surgical history that includes no surgical history (09/2012).  Social History:   reports that he has never smoked. He has never used smokeless tobacco. He reports that he does not drink alcohol or use illicit drugs.  Skin: WNL  Family was with patient prior to this RNs arrival.

## 2012-12-30 LAB — CBC
MCH: 28.9 pg (ref 26.0–34.0)
MCV: 89.5 fL (ref 78.0–100.0)
Platelets: 241 10*3/uL (ref 150–400)
RBC: 3.43 MIL/uL — ABNORMAL LOW (ref 4.22–5.81)

## 2012-12-30 LAB — BASIC METABOLIC PANEL
BUN: 19 mg/dL (ref 6–23)
CO2: 24 mEq/L (ref 19–32)
Calcium: 8.8 mg/dL (ref 8.4–10.5)
Creatinine, Ser: 1.6 mg/dL — ABNORMAL HIGH (ref 0.50–1.35)
GFR calc non Af Amer: 46 mL/min — ABNORMAL LOW (ref >90)
Glucose, Bld: 206 mg/dL — ABNORMAL HIGH (ref 70–99)

## 2012-12-30 LAB — GLUCOSE, CAPILLARY
Glucose-Capillary: 207 mg/dL — ABNORMAL HIGH (ref 70–99)
Glucose-Capillary: 349 mg/dL — ABNORMAL HIGH (ref 70–99)
Glucose-Capillary: 279 mg/dL — ABNORMAL HIGH (ref 70–99)
Glucose-Capillary: 256 mg/dL — ABNORMAL HIGH (ref 70–99)

## 2012-12-30 MED ORDER — IPRATROPIUM BROMIDE 0.02 % IN SOLN: 0.5000 mg | Freq: Four times a day (QID) | RESPIRATORY_TRACT | Status: DC

## 2012-12-30 MED ORDER — ALBUTEROL SULFATE (5 MG/ML) 0.5% IN NEBU
2.5000 mg | INHALATION_SOLUTION | Freq: Three times a day (TID) | RESPIRATORY_TRACT | Status: DC
  Administered 2012-12-30 – 2013-01-01 (×6): 2.5 mg via RESPIRATORY_TRACT
  Filled 2012-12-30 (×7): qty 0.5

## 2012-12-30 MED ORDER — INSULIN ASPART 100 UNIT/ML ~~LOC~~ SOLN
5.0000 [IU] | Freq: Three times a day (TID) | SUBCUTANEOUS | Status: DC
  Administered 2012-12-31 – 2013-01-01 (×5): 5 [IU] via SUBCUTANEOUS

## 2012-12-30 MED ORDER — IPRATROPIUM BROMIDE 0.02 % IN SOLN
0.5000 mg | Freq: Three times a day (TID) | RESPIRATORY_TRACT | Status: DC
  Administered 2012-12-30 – 2013-01-01 (×6): 0.5 mg via RESPIRATORY_TRACT
  Filled 2012-12-30 (×7): qty 2.5

## 2012-12-30 MED ORDER — IPRATROPIUM BROMIDE 0.02 % IN SOLN: 0.5000 mg | RESPIRATORY_TRACT | Status: DC | PRN

## 2012-12-30 MED ORDER — ALBUTEROL SULFATE (5 MG/ML) 0.5% IN NEBU: 2.5000 mg | INHALATION_SOLUTION | Freq: Three times a day (TID) | RESPIRATORY_TRACT | Status: DC

## 2012-12-30 MED ORDER — INSULIN GLARGINE 100 UNIT/ML ~~LOC~~ SOLN
23.0000 [IU] | Freq: Every day | SUBCUTANEOUS | Status: DC
  Administered 2012-12-30 – 2012-12-31 (×2): 23 [IU] via SUBCUTANEOUS
  Filled 2012-12-30 (×3): qty 0.23

## 2012-12-30 MED ORDER — RIVAROXABAN 20 MG PO TABS: 20.0000 mg | ORAL_TABLET | Freq: Every day | ORAL | Status: DC

## 2012-12-30 NOTE — Progress Notes (Signed)
Orders to transfer to medsurg bed, although critical care recommended stepdown. Patient intermittently confused and has removed nitro patch. Easily redirectable. Report called and patient transferred to 6E5. Patient family at bedside.

## 2012-12-30 NOTE — Progress Notes (Addendum)
[facility] PROGRESS NOTE  Juan Glenn ZOX:[id number] DOB: 1955/07/25 DOA: 12/18/2012 PCP: RECORD,CHARLES   HPI/Subjective: He is up eating breakfast and says it is easier to breathe.   Brief narrative:  57 y/o with h/o DM, CKD 3 admitted on 11/6 with seizure due to PRESS. Also diagnosed with aspiration pneumonia and discharged by the ICU service on Levaquin 11/18. Pt returned to the hospital the night after discharge with respiratory failure and was found to have b/l multifocal pulmonary emboli, as well as a righted sided DVT.   He was intubated on admission 11/19 and extubated on 11/23.  He was transferred to the hospitalist service on 11/25 but due to recurrent aspiration and then pulm edema, he was not moved out of the ICU until 11/28.    SIGNIFICANT EVENTS / STUDIES:  11/19 Respiratory distress. Intubated.  11/19 CTA chest >>> Bilateral PE, multifocal pneumonia  11/20 duplex > acute B LE DVT  11/21 echo done > EF 55-60, no right heart strain noted  11/21 with continued fevers, recultured.  11/21 1L bolus with hypotension>>improved  11/22 Renal US >> negative  11/23 Extubated  11/26- increased O2 requirements/ abdominal breathing and fevers- most likely repeat aspiration  11/27- pulm edema- diuresed  11/28- transferred to step down- did not require BiPAP over night but did need multiple neb treatments     Assessment/Plan:  Acute respiratory failure -Due to PE superimposed on pneumonia -Had some recurrent respiratory distress from pulmonary edema.  Received Lasix and neb and placed back on Bipap until resolved. Then became over diuresed.   -Xray on 11/30 showed significant improvement in bilateral edema or infiltrates with mild residual consolidation.   Hypernatremia -likely secondary to overdiuresis -increased D5W, if SOB then restart lasix  Fever -Low grade fevers likely etiology is aspiration pneumonia -resolved -10 days of vanc and zosyn has been  administrated.  Antibiotics are complete.  Aspiration pneumonia -recurrent episodes of aspiration and resultant hypoxia and fever on 11/27 -10 days of vanc and zosyn has been administrated. now resolved  Pulmonary embolism -started Xarelto now that he has passed his swallow eval  DM type 2 with renal complications -Also on Jentadueto at home-sugars still elevated therefor will resume Januvia -Hemoglobin A1C 8.0 -CBG still elevated. Increased Novolog and meal time coverage 12/1  Convulsions/seizures -In setting of hypertensive emergency -None since admission, no antiepileptic needed.  HTN -admitted for PRESS with a seizure on December 05, 2012 -currently stable on labetalol will decrease dose from 300 TID to 300 BID -add Norvasc to improve control -controlled  CKD stage 3, GFR 30-59 ml/min -stable  Encephalopathy -appears to have started after seizures/ PRESS -very poor memory and at times impulsive -mental status improved    Code Status: full Family Communication: wife in room Disposition Plan: d/c to SNF when appropriate    Consultants:  PCCM  Procedures:  none  Antibiotics: Cefepime 11/19 x 1  Vancomycin 11/19 x 1  Levaquin 11/19 >>>11/21  vanco 11/22 >> 11/30 Zosyn 11/22 >> 11/29    Objective: Filed Vitals:   12/30/12 1308  BP: 105/71  Pulse: 96  Temp: 98.2 F (36.8 C)  Resp: 20    Intake/Output Summary (Last 24 hours) at 12/30/12 1411 Last data filed at 12/30/12 0558  Gross per 24 hour  Intake      0 ml  Output   2300 ml  Net  -2300 ml   Filed Weights   12/28/12 1627 12/29/12 1426 12/30/12 0500  Weight: 85.1 kg (  187 lb 9.8 oz) 85.6 kg (188 lb 11.4 oz) 85.276 kg (188 lb)    Exam:   General:  Awake and alert  Cardiovascular: RRR, no murmurs, rubs, or gallops  Respiratory: Clear to ausculation bilaterally without wheezes or crackles. 96% on 3L   Abdomen: nontender, nondistended, soft, + BS, no ascites, no appreciable  mass  Musculoskeletal: no cyanosis, clubbing, or edema bilateral lower extremities.   Data Reviewed: Basic Metabolic Panel:  Recent Labs Lab 12/24/12 0220 12/25/12 0951 12/26/12 0420 12/26/12 1625 12/27/12 0334 12/28/12 1350 12/29/12 0545 12/30/12 0614  NA 155* 157* 157*  --  155* 157* 158* 158*  K 4.0 3.1* 3.1* 3.6 3.7 4.0 3.9 4.0  CL 120* 124* 122*  --  118* 120* 121* 125*  CO2 24 25 26   --  26 24 25 24   GLUCOSE 259* 237* 208*  --  202* 356* 326* 206*  BUN 22 17 15   --  19 24* 19 19  CREATININE 1.59* 1.58* 1.54*  --  1.79* 1.73* 1.58* 1.60*  CALCIUM 8.8 8.6 8.3*  --  8.8 9.2 9.0 8.8  MG 2.4 2.1  --   --  2.2  --   --   --   PHOS 2.7  --   --   --  4.6  --   --   --    CBC:  Recent Labs Lab 12/25/12 0445 12/26/12 0420 12/27/12 0334 12/28/12 0514 12/30/12 0614  WBC 12.3* 13.2* 12.9* 11.6* 9.9  HGB 8.9* 9.0* 10.1* 10.3* 9.9*  HCT 27.1* 27.3* 30.4* 32.1* 30.7*  MCV 87.4 88.1 87.4 89.7 89.5  PLT 355 349 364 312 241   BNP (last 3 results)  Recent Labs  12/05/12 0715 12/18/12 2151  PROBNP 85.4 342.7*   CBG:  Recent Labs Lab 12/29/12 1133 12/29/12 1707 12/29/12 2124 12/30/12 0742 12/30/12 1206  GLUCAP 326* 319* 259* 207* 349*    Recent Results (from the past 240 hour(s))  URINE CULTURE     Status: None   Collection Time    12/20/12  5:36 PM      Result Value Range Status   Specimen Description URINE, RANDOM   Final   Special Requests NONE   Final   Culture  Setup Time     Final   Value: 12/20/2012 22:06     Performed at [facility] Count     Final   Value: NO GROWTH     Performed at [facility]   Culture     Final   Value: NO GROWTH     Performed at [facility]   Report Status 12/21/2012 FINAL   Final  CULTURE, BLOOD (ROUTINE X 2)     Status: None   Collection Time    12/20/12  6:15 PM      Result Value Range Status   Specimen Description BLOOD LEFT HAND   Final   Special Requests BOTTLES DRAWN AEROBIC  ONLY 2.5CC   Final   Culture  Setup Time     Final   Value: 12/21/2012 02:54     Performed at [facility]   Culture     Final   Value: NO GROWTH 5 DAYS     Performed at [facility]   Report Status 12/27/2012 FINAL   Final  CULTURE, BLOOD (ROUTINE X 2)     Status: None   Collection Time    12/20/12  8:34 PM  Result Value Range Status   Specimen Description BLOOD LEFT HAND   Final   Special Requests BOTTLES DRAWN AEROBIC ONLY [facility]   Final   Culture  Setup Time     Final   Value: 12/21/2012 02:54     Performed at [facility]   Culture     Final   Value: NO GROWTH 5 DAYS     Performed at [facility]   Report Status 12/27/2012 FINAL   Final  CULTURE, RESPIRATORY (NON-EXPECTORATED)     Status: None   Collection Time    12/21/12  8:32 PM      Result Value Range Status   Specimen Description TRACHEAL ASPIRATE   Final   Special Requests NONE   Final   Gram Stain     Final   Value: RARE WBC PRESENT, PREDOMINANTLY PMN     NO SQUAMOUS EPITHELIAL CELLS SEEN     NO ORGANISMS SEEN     Performed at [facility]   Culture     Final   Value: Non-Pathogenic Oropharyngeal-type Flora Isolated.     Performed at [facility]   Report Status 12/24/2012 FINAL   Final  URINE CULTURE     Status: None   Collection Time    12/21/12  8:58 PM      Result Value Range Status   Specimen Description URINE, CATHETERIZED   Final   Special Requests NONE   Final   Culture  Setup Time     Final   Value: 12/22/2012 02:05     Performed at [facility] Count     Final   Value: NO GROWTH     Performed at [facility]   Culture     Final   Value: NO GROWTH     Performed at [facility]   Report Status 12/23/2012 FINAL   Final  CLOSTRIDIUM DIFFICILE BY PCR     Status: None   Collection Time    12/26/12 10:37 AM      Result Value Range Status   C difficile by pcr NEGATIVE  NEGATIVE Final     Studies: Dg Chest 2  View  12/29/2012   CLINICAL DATA:  Pulmonary edema I  EXAM: CHEST - 2 VIEW  COMPARISON:  12/26/2012  FINDINGS: Right IJ central line has been removed. Low lung volumes. Mild interstitial edema, significantly improved from previous exam. Mild residual consolidation or subsegmental atelectasis posteriorly in both lower lobes. There may be tiny residual pleural effusions. Moderate cardiomegaly persists.  IMPRESSION: 1. Significant improvement in bilateral edema or infiltrates with mild residual as above. 2. Stable cardiomegaly.   Electronically Signed   By: Oley Balm M.D.   On: 12/29/2012 12:31    Scheduled Meds: . ipratropium  0.5 mg Nebulization TID   And  . albuterol  2.5 mg Nebulization TID  . amLODipine  10 mg Oral Daily  . aspirin  81 mg Oral Daily  . atorvastatin  20 mg Oral q1800  . feeding supplement (ENSURE)  1 Container Oral TID BM  . insulin aspart  0-15 Units Subcutaneous TID WC  . insulin aspart  0-5 Units Subcutaneous QHS  . insulin aspart  5 Units Subcutaneous TID WC  . insulin glargine  23 Units Subcutaneous QHS  . labetalol  300 mg Oral BID  . linagliptin  2.5 mg Oral Daily  . pantoprazole  40 mg Oral Daily  . Rivaroxaban  15 mg Oral BID WC  . [START ON 01/15/2013] rivaroxaban  20 mg Oral Q supper   Continuous Infusions: . dextrose 50 mL/hr at 12/30/12 4098    Principal Problem:   Acute respiratory failure Active Problems:   HTN (hypertension), malignant   DM (diabetes mellitus), type 2 with renal complications   Convulsions/seizures   Pulmonary embolism   CKD (chronic kidney disease) stage 3, GFR 30-59 ml/min   Aspiration pneumonia    Time spent: 88    Jari Favre PA-S [facility] Pager [phone]. If 7PM-7AM, please contact night-coverage at [email or web address], password [facility] 12/30/2012, 2:11 PM  LOS: 12 days     Addendum  Patient seen and examined, chart and data base reviewed.  I agree with the above assessment and plan.  For full details  please see MS. Jari Favre PA-S note.   Clint Lipps, MD [facility] Pager: [phone] 12/30/2012, 2:17 PM

## 2012-12-30 NOTE — Progress Notes (Signed)
Patient at min guard 200 feet with P.T. Not in need of intense inpt rehab at this time. I discussed with wife at bedside. She feels he is not at a level to go home at this time. rec SNF. I have discussed with SW and RN CM. [phone]

## 2012-12-30 NOTE — Progress Notes (Signed)
Inpatient Diabetes Program Recommendations  AACE/ADA: New Consensus Statement on Inpatient Glycemic Control (2013)  Target Ranges:  Prepandial:   less than 140 mg/dL      Peak postprandial:   less than 180 mg/dL (1-2 hours)      Critically ill patients:  140 - 180 mg/dL  Results for Juan Glenn, Juan Glenn (MRN [id number]) as of 12/30/2012 11:09  Ref. Range 12/29/2012 07:43 12/29/2012 11:33 12/29/2012 17:07 12/29/2012 21:24 12/30/2012 07:42  Glucose-Capillary Latest Range: 70-99 mg/dL 409 (H) 811 (H) 914 (H) 259 (H) 207 (H)    Inpatient Diabetes Program Recommendations Insulin - Basal: Please consider increasing Lantus to 25 units QHS. Correction (SSI): Please consider increasing Novolog correction to resistant scale. Insulin - Meal Coverage: Please consider increasing Novolog meal coverage to 6 units TID with meals.  Note: Blood glucose ranged from 259-326 mg/dl on 78/29 and fasting glucose was 207 mg/dl this morning.  Patient received a total of Novolog 33 units on 11/30 for correction (in addition to Lantus and Novolog meal coverage).  Please consider increasing Latus to 25 units QHS, increasing Novolog correction to resistant scale, and increasing Novolog meal coverage to 6 units TID with meals.  Will continue to follow.  Thanks, Orlando Penner, RN, MSN, CCRN Diabetes Coordinator Inpatient Diabetes Program [phone] (Team Pager) [phone] ([facility] office) [phone] [facility] office)

## 2012-12-30 NOTE — Progress Notes (Signed)
NUTRITION FOLLOW UP  Intervention:   Continue Ensure Pudding po TID to maximize oral intake, each supplement provides 170 kcal and 4 grams of protein. RD to continue to follow nutrition care plan.   Nutrition Dx:   Inadequate oral intake related to dysphagia as evidenced by diet variable intake; ongoing.    Goal:  Intake to meet >90% of estimated nutrition needs.  Monitor:  PO intake, swallowing function, diet advancement, weight trend, labs.  Assessment:   Patient was discharged on 11/19 after being treated for Enterobacter aerogenes pneumonia complicated by VDRF; returned to [facility] ED same day in respiratory distress. CTA chest revealed large PE.   Patient was extubated on 11/23. MBSS completed by SLP on 11/26 with recommendations for Dysphagia 2 diet with Nectar-Thickened Liquids. Continues on this diet at this time, also with Ensure Pudding PO TID ordered. Family states that his oral intake is improving, and that they are encouraging him to drink his liquids as much as possible. Pt enjoys the Ensure Pudding - will continue this order.  Sodium continues to increase - currently 158. Currently ordered for D5 at 50 ml/hr. Blood sugars ranging from 207-349.  Height: Ht Readings from Last 1 Encounters:  12/28/12 5\' 9"  (1.753 m)    Weight Status:   Wt Readings from Last 1 Encounters:  12/30/12 188 lb (85.276 kg)  12/20/12  210 lb 1.6 oz (95.3 kg)  Admission weight 207 lb (94 kg) Usual weight 209 lb   Re-estimated needs:  Kcal: 1800 - 2000 Protein: 85 - 100 grams Fluid: > 2 L/day  Skin: dry skin over area of abraised site on side of left pinky finger  Diet Order: Dysphagia 2 with Nectar Thickened Liquids   Intake/Output Summary (Last 24 hours) at 12/30/12 1215 Last data filed at 12/30/12 0558  Gross per 24 hour  Intake    240 ml  Output   2300 ml  Net  -2060 ml    Last BM: 11/30   Labs:   Recent Labs Lab 12/24/12 0220 12/25/12 0951  12/27/12 0334 12/28/12 1350  12/29/12 0545 12/30/12 0614  NA 155* 157*  < > 155* 157* 158* 158*  K 4.0 3.1*  < > 3.7 4.0 3.9 4.0  CL 120* 124*  < > 118* 120* 121* 125*  CO2 24 25  < > 26 24 25 24   BUN 22 17  < > 19 24* 19 19  CREATININE 1.59* 1.58*  < > 1.79* 1.73* 1.58* 1.60*  CALCIUM 8.8 8.6  < > 8.8 9.2 9.0 8.8  MG 2.4 2.1  --  2.2  --   --   --   PHOS 2.7  --   --  4.6  --   --   --   GLUCOSE 259* 237*  < > 202* 356* 326* 206*  < > = values in this interval not displayed.  CBG (last 3)   Recent Labs  12/29/12 2124 12/30/12 0742 12/30/12 1206  GLUCAP 259* 207* 349*   Lab Results  Component Value Date   HGBA1C 8.0* 12/24/2012   Scheduled Meds: . ipratropium  0.5 mg Nebulization TID   And  . albuterol  2.5 mg Nebulization TID  . amLODipine  10 mg Oral Daily  . aspirin  81 mg Oral Daily  . atorvastatin  20 mg Oral q1800  . feeding supplement (ENSURE)  1 Container Oral TID BM  . insulin aspart  0-15 Units Subcutaneous TID WC  . insulin aspart  0-5 Units Subcutaneous QHS  . insulin aspart  4 Units Subcutaneous TID WC  . insulin glargine  20 Units Subcutaneous QHS  . labetalol  300 mg Oral BID  . linagliptin  2.5 mg Oral Daily  . pantoprazole  40 mg Oral Daily  . Rivaroxaban  15 mg Oral BID WC  . [START ON 01/15/2013] rivaroxaban  20 mg Oral Q supper    Continuous Infusions: . dextrose 50 mL/hr at 12/30/12 0553    Juan Motto MS, RD, LDN Pager: [phone] After-hours pager: [phone]

## 2012-12-30 NOTE — Evaluation (Signed)
Occupational Therapy Evaluation Patient Details Name: Juan Glenn MRN: [id number] DOB: [birth year] Today's Date: 12/30/2012 Time: 4098-1191 OT Time Calculation (min): 29 min  OT Assessment / Plan / Recommendation History of present illness 57 yo discharged 11/19 after being treated for Enterobacter aerogenes pneumonia complicated by VDRF, returned to [facility] ED same day in respiratory distress.  Intubated. CTA chest revealed large PE. Extubated 11/23.   Clinical Impression   Pt presents w/ dx as above and now w/ overall deconditioning and generalized weakness, decreased balance as well as decreased cognition (See OT problem list below). Pt will benefit from acute OT followed by CIR/in-pt rehab to assist in maximizing independence w/ ADL's and self care w/ eventual return home w/ family when able.    OT Assessment  Patient needs continued OT Services    Follow Up Recommendations  CIR    Barriers to Discharge      Equipment Recommendations  None recommended by OT;Other (comment) (Defer to next venue)    Recommendations for Other Services    Frequency  Min 2X/week    Precautions / Restrictions Precautions Precautions: Fall   Pertinent Vitals/Pain No, denies pain    ADL  Eating/Feeding: Performed;Set up;Supervision/safety Where Assessed - Eating/Feeding: Chair Grooming: Performed;Wash/dry hands;Wash/dry face;Set up;Supervision/safety Upper Body Bathing: Simulated;Minimal assistance Where Assessed - Upper Body Bathing: Supported sitting Lower Body Bathing: Minimal assistance;Moderate assistance;Performed Where Assessed - Lower Body Bathing: Supported sit to stand;Supported standing Upper Body Dressing: Simulated;Minimal assistance Where Assessed - Upper Body Dressing: Unsupported sitting Lower Body Dressing: Simulated;Minimal assistance Where Assessed - Lower Body Dressing: Supported sit to Pharmacist, hospital: Simulated;Minimal assistance (Transferred EOB to chair,  performed peri care standing) Toilet Transfer Method: Sit to stand;Stand pivot Acupuncturist: Bedside commode Toileting - Clothing Manipulation and Hygiene: Performed;Minimal assistance Where Assessed - Engineer, mining and Hygiene: Standing Tub/Shower Transfer Method: Not assessed Equipment Used: Gait belt (Has RW) Transfers/Ambulation Related to ADLs: Min guard to min A for balance, VC's and increased time for sequencing/processing.  As he fatigues, requires min A ADL Comments: Pt participated in ADL's sitting EOB followed by pericare in supported standing. Pt/wife were educated in role of OT and recommendations for CIR/in-pt rehab. Pt/wife verbalized agreement with this. Pt is overall deconditioned and fatigues easily. Requires assist due to fatigue and balance.    OT Diagnosis: Generalized weakness;Cognitive deficits  OT Problem List: Decreased strength;Decreased activity tolerance;Impaired balance (sitting and/or standing);Impaired vision/perception;Decreased coordination;Decreased cognition;Decreased safety awareness;Decreased knowledge of use of DME or AE OT Treatment Interventions: Self-care/ADL training;DME and/or AE instruction;Therapeutic activities;Cognitive remediation/compensation;Patient/family education;Balance training;Therapeutic exercise   OT Goals(Current goals can be found in the care plan section) Acute Rehab OT Goals Patient Stated Goal: Rehab OT Goal Formulation: With patient/family Time For Goal Achievement: 01/13/13 Potential to Achieve Goals: Good  Visit Information  Last OT Received On: 12/30/12 Assistance Needed: +1 History of Present Illness: 57 yo discharged 11/19 after being treated for Enterobacter aerogenes pneumonia complicated by VDRF, returned to [facility] ED same day in respiratory distress.  Intubated. CTA chest revealed large PE. Extubated 11/23.       Prior Functioning     Home Living Family/patient expects to be discharged  to:: Private residence Living Arrangements: Spouse/significant other Available Help at Discharge: Family;Available PRN/intermittently Type of Home: House Home Access: Stairs to enter [employer] of Steps: 3 Entrance Stairs-Rails: None Home Layout: One level Home Equipment: Walker - 2 wheels Prior Function Level of Independence: Independent Communication Communication: No difficulties Dominant Hand: Right  Vision/Perception Vision - History Baseline Vision: Wears contacts Patient Visual Report: No change from baseline   Cognition  Cognition Arousal/Alertness: Awake/alert Behavior During Therapy: Flat affect Overall Cognitive Status: Impaired/Different from baseline Area of Impairment: Attention;Safety/judgement Orientation Level: Disoriented to;Time Current Attention Level: Sustained Memory: Decreased short-term memory Safety/Judgement: Decreased awareness of safety Problem Solving: Slow processing;Requires verbal cues General Comments: Decr awareness (including requiring mitts bil hands to protect lines) Off and on at times - left off while eating, pt's wife in room    Extremity/Trunk Assessment Upper Extremity Assessment Upper Extremity Assessment: Generalized weakness RUE Coordination: decreased fine motor Lower Extremity Assessment Lower Extremity Assessment: Generalized weakness Cervical / Trunk Assessment Cervical / Trunk Assessment: Normal    Mobility Bed Mobility Bed Mobility: Supine to Sit;Sitting - Scoot to Edge of Bed Supine to Sit: 5: Supervision;4: Min guard;HOB elevated Sitting - Scoot to Edge of Bed: 5: Supervision;4: Min guard;With rail Details for Bed Mobility Assistance: supervision for safety Transfers Transfers: Sit to Stand;Stand to Sit Sit to Stand: 4: Min assist;4: Min guard;With upper extremity assist Stand to Sit: 4: Min assist;With upper extremity assist;To chair/3-in-1 Details for Transfer Assistance: VC's/TC's for safety,  sequencing and hand placement. Increased time for all tasks.        Balance Balance Balance Assessed: Yes Static Sitting Balance Static Sitting - Balance Support: Right upper extremity supported;Feet supported Static Sitting - Level of Assistance: 6: Modified independent (Device/Increase time) Static Standing Balance Static Standing - Balance Support: Bilateral upper extremity supported Static Standing - Level of Assistance: 5: Stand by assistance;4: Min assist Static Standing - Comment/# of Minutes: 3-5 min during ADL's   End of Session OT - End of Session Equipment Utilized During Treatment: Gait belt;Oxygen Activity Tolerance: Patient limited by fatigue Patient left: in chair;with call bell/phone within reach;with family/visitor present Nurse Communication: Mobility status  GO     Alm Bustard 12/30/2012, 8:39 AM

## 2012-12-30 NOTE — Progress Notes (Signed)
Ambulated in halls without O2, sats was 96%.  Left O2 off and pt. Sat up in chair.  Will continue to monitor.  Forbes Cellar, RN

## 2012-12-30 NOTE — Clinical Social Work Psychosocial (Signed)
Clinical Social Work Department BRIEF PSYCHOSOCIAL ASSESSMENT 12/30/2012  Patient:  SHERVIN, CYPERT     Account Number:  [id number]     Admit date:  12/18/2012  Clinical Social Worker:  Lavell Luster  Date/Time:  12/30/2012 01:00 PM  Referred by:  Physician  Date Referred:  12/30/2012 Referred for  SNF Placement   Other Referral:   Interview type:  Family Other interview type:   Patient was not oriented at time of assessment. Wife was interviewed.    PSYCHOSOCIAL DATA Living Status:  WIFE Admitted from facility:   Level of care:   Primary support name:  Zac Torti [phone] Primary support relationship to patient:  SPOUSE Degree of support available:   Support is strong.    CURRENT CONCERNS Current Concerns  Post-Acute Placement   Other Concerns:    SOCIAL WORK ASSESSMENT / PLAN CSW met with wife at bedside to discuss recommendation for SNF placement. Patient is not considered appropriate for CIR. Wife is agreeable to SNF placement and has preference for [facility] or [place]. Patient is from home with wife and plans to return after SNF stay.   Assessment/plan status:  Psychosocial Support/Ongoing Assessment of Needs Other assessment/ plan:   Complete FL2, Fax, PASRR   Information/referral to community resources:   SNF list and CSW contact information given to wife.    PATIENT'S/FAMILY'S RESPONSE TO PLAN OF CARE: Wife is agreeable to SNF placement as CIR is not an option for patient. Wife was pleasant and appreciative of CSW contact. Wife awaits bed offers. CSW will continue to follow.       [name], [place], [place], [phone]

## 2012-12-30 NOTE — Progress Notes (Signed)
I met with pt's wife at bedside. Discussed inpt rehab venue as well as SNF for rehab. She does not feel pt is at baseline to d/c directly home. Also she notes pt's confusion. I have contacted P.T. To request updated therapy notes today to begin insurance authorization for a possible inpt rehab vs SNF admission. She is open to [facility] if inpt rehab is not an option. [phone]

## 2012-12-30 NOTE — Clinical Social Work Note (Signed)
Wife informed of bed offers received. Wife chooses [facility]. Tresa Endo with [facility] has been contacted and [employer] authorization has been started. CSW will continue to follow.   [name], [place], [place], [phone]

## 2012-12-30 NOTE — Clinical Social Work Note (Signed)
CSW has confirmed that patient's first choice of SNF [facility]) does not have a Conservator, museum/gallery. Wife informed that she will need to pick another facility from the offers the patient receives so that [facility] authorization can be started.  [name], [place], [place], [phone]

## 2012-12-30 NOTE — Clinical Social Work Placement (Signed)
Clinical Social Work Department CLINICAL SOCIAL WORK PLACEMENT NOTE 12/30/2012  Patient:  NITISH, ROES  Account Number:  [id number] Admit date:  12/18/2012  Clinical Social Worker:  Cherre Blanc, [place]  Date/time:  12/30/2012 02:00 PM  Clinical Social Work is seeking post-discharge placement for this patient at the following level of care:   SKILLED NURSING   (*CSW will update this form in Epic as items are completed)   12/30/2012  Patient/family provided with [facility] Department of Clinical Social Work's list of facilities offering this level of care within the geographic area requested by the patient (or if unable, by the patient's family).  12/30/2012  Patient/family informed of their freedom to choose among providers that offer the needed level of care, that participate in Medicare, Medicaid or managed care program needed by the patient, have an available bed and are willing to accept the patient.  12/30/2012  Patient/family informed of [facility]' ownership interest in [facility], as well as of the fact that they are under no obligation to receive care at this facility.  PASARR submitted to EDS on 12/30/2012 PASARR number received from EDS on 12/30/2012  FL2 transmitted to all facilities in geographic area requested by pt/family on  12/30/2012 FL2 transmitted to all facilities within larger geographic area on   Patient informed that his/her managed care company has contracts with or will negotiate with  certain facilities, including the following:     Patient/family informed of bed offers received:  12/30/2012 Patient chooses bed at [facility] AND [facility] Physician recommends and patient chooses bed at    Patient to be transferred to [facility]   Patient to be transferred to facility by   The following physician request were entered in Epic:   Additional Comments:  [name], [place], [phone]

## 2012-12-30 NOTE — Progress Notes (Signed)
ANTICOAGULATION CONSULT NOTE - Follow Up Consult  Pharmacy Consult for Xarelto Indication: B/L DVT and PE  No Known Allergies  Patient Measurements: Height: 5\' 9"  (175.3 cm) Weight: 188 lb (85.276 kg) IBW/kg (Calculated) : 70.7  Vital Signs: Temp: 98.2 F (36.8 C) (12/01 0940) Temp src: Oral (12/01 0940) BP: 131/83 mmHg (12/01 0940) Pulse Rate: 113 (12/01 0940)  Labs:  Recent Labs  12/28/12 0514 12/28/12 1350 12/29/12 0545 12/30/12 0614  HGB 10.3*  --   --  9.9*  HCT 32.1*  --   --  30.7*  PLT 312  --   --  241  CREATININE  --  1.73* 1.58* 1.60*    Estimated Creatinine Clearance: 54.5 ml/min (by C-G formula based on Cr of 1.6).   Medications:  Xarelto 15 mg twice daily with meals (11/25 >> planned 12/16) Xarelto 20 mg once daily with supper (to start on Wed, 12/17)  Assessment: 57 yo M discharged 11/19 after being treated for Enterobacter aerogenes pneumonia complicated by VDRF, returned to [facility] ED same day in respiratory distress. Patient was intubated and CTA chest revealed PE. Subsequent lower extremity dopplers also revealed bilateral LE DVTs.   Treatment of Deep Vein Thrombosis (DVT), Pulmonary Embolism (PE), and Reduction in the Risk of Recurrence of DVT and of PE: Avoid the use of XARELTO in patients with CrCl <30 mL/min due to an expected increase in rivaroxaban exposure and pharmacodynamic effects in this patient population. (Product package insert)  The patient's renal function remains appropriate for the current dosing, SCr 1.6, estimated CrCl between 50-60 ml/min. Hgb/Hct/Plt slight drop -- no overt s/sx of bleeding noted at this time. Current regimen of 15mg  bid x 21 days followed by 20mg  daily is appropriate.  The wife was educated on Xarelto this morning -- all questions were addressed.  Goal of Therapy:  Appropriate anticoagulation for indication and renal/hepatic function   Plan:  1. Continue Xarelto 15 mg twice daily with meals thru Tuesday,  01/14/13 2. Start Xarelto 20 mg once daily with supper on Wednesday, 01/15/13 3. Will continue to monitor for any s/sx of bleeding and renal function for any necessary adjustments or changes in therapy  Georgina Pillion, PharmD, BCPS Clinical Pharmacist Pager: [phone] 12/30/2012 11:15 AM

## 2012-12-30 NOTE — Progress Notes (Signed)
Physical Therapy Treatment Patient Details Name: Srihari Shellhammer MRN: [id number] DOB: 1955/02/08 Today's Date: 12/30/2012 Time: 1202-1229 PT Time Calculation (min): 27 min  PT Assessment / Plan / Recommendation  History of Present Illness 57 yo discharged 11/19 after being treated for Enterobacter aerogenes pneumonia complicated by VDRF, returned to [facility] ED same day in respiratory distress.  Intubated. CTA chest revealed large PE. Extubated 11/23.   PT Comments   Pt progressing towards physical therapy goals. Pt initially required increased encouragement for therapy, as he thought there were people outside of his room that wanted to know his "business". Once agreeable, pt participated very well in session. He was able to improve ambulation distance, and did not report any SOB during gait training. Some balance disturbances noted, however no physical assist required to recover. Pt and wife report that they are hoping for CIR when appropriate for d/c.   Follow Up Recommendations  CIR     Does the patient have the potential to tolerate intense rehabilitation     Barriers to Discharge        Equipment Recommendations  None recommended by PT    Recommendations for Other Services    Frequency Min 3X/week   Progress towards PT Goals Progress towards PT goals: Progressing toward goals  Plan Current plan remains appropriate    Precautions / Restrictions Precautions Precautions: Fall Restrictions Weight Bearing Restrictions: No   Pertinent Vitals/Pain Pt reports no pain or SOB throughout session.     Mobility  Bed Mobility Bed Mobility: Supine to Sit;Sitting - Scoot to Edge of Bed Supine to Sit: 5: Supervision;HOB elevated;With rails Sitting - Scoot to Edge of Bed: 4: Min guard;With rail Details for Bed Mobility Assistance: Min guard for safety during scooting as pt was getting very close to EOB. Transfers Transfers: Sit to Stand;Stand to Sit Sit to Stand: 4: Min guard;From  bed;With upper extremity assist Stand to Sit: 4: Min guard;To chair/3-in-1;With upper extremity assist Details for Transfer Assistance: VC's for hand placement on seated surface for safety. Increased time to get to full stand, but no assist needed. Ambulation/Gait Ambulation/Gait Assistance: 4: Min guard Ambulation Distance (Feet): 200 Feet Assistive device: Rolling walker Ambulation/Gait Assistance Details: VC's for improved posture. Pt with 2 slight losses of balance during ambulation however did not require assist to recover.  Gait Pattern: Step-through pattern;Decreased stride length;Trunk flexed;Narrow base of support Gait velocity: decreased Stairs: No    Exercises General Exercises - Lower Extremity Long Arc Quad: 15 reps Hip ABduction/ADduction: 15 reps Hip Flexion/Marching: 15 reps;Seated   PT Diagnosis:    PT Problem List:   PT Treatment Interventions:     PT Goals (current goals can now be found in the care plan section) Acute Rehab PT Goals Patient Stated Goal: Rehab PT Goal Formulation: With patient/family Time For Goal Achievement: 12/31/12 Potential to Achieve Goals: Good  Visit Information  Last PT Received On: 12/30/12 Assistance Needed: +1 History of Present Illness: 57 yo discharged 11/19 after being treated for Enterobacter aerogenes pneumonia complicated by VDRF, returned to [facility] ED same day in respiratory distress.  Intubated. CTA chest revealed large PE. Extubated 11/23.    Subjective Data  Subjective: "I don't like for everyone to know my business, are the people in the hallway gone?" Patient Stated Goal: Rehab   Cognition  Cognition Arousal/Alertness: Awake/alert Behavior During Therapy: Flat affect Overall Cognitive Status: Impaired/Different from baseline Area of Impairment: Attention;Safety/judgement Current Attention Level: Sustained Safety/Judgement: Decreased awareness of safety Problem Solving: Slow processing;Requires  verbal cues     Balance  Balance Balance Assessed: Yes Static Sitting Balance Static Sitting - Balance Support: Bilateral upper extremity supported;Feet supported Static Sitting - Level of Assistance: 6: Modified independent (Device/Increase time) Static Standing Balance Static Standing - Balance Support: Bilateral upper extremity supported Static Standing - Level of Assistance: 5: Stand by assistance  End of Session PT - End of Session Equipment Utilized During Treatment: Gait belt Activity Tolerance: Patient tolerated treatment well Patient left: in chair;with call bell/phone within reach;with family/visitor present Nurse Communication: Mobility status   GP     Ruthann Cancer 12/30/2012, 12:39 PM  Ruthann Cancer, PT, DPT [phone]

## 2012-12-31 DIAGNOSIS — R55 Syncope and collapse: Secondary | ICD-10-CM

## 2012-12-31 LAB — CBC
HCT: 31.5 % — ABNORMAL LOW (ref 39.0–52.0)
Hemoglobin: 10.3 g/dL — ABNORMAL LOW (ref 13.0–17.0)
MCHC: 32.7 g/dL (ref 30.0–36.0)
MCV: 86.8 fL (ref 78.0–100.0)
RBC: 3.63 MIL/uL — ABNORMAL LOW (ref 4.22–5.81)
RDW: 14.4 % (ref 11.5–15.5)
WBC: 10.3 10*3/uL (ref 4.0–10.5)

## 2012-12-31 LAB — BASIC METABOLIC PANEL
CO2: 22 mEq/L (ref 19–32)
Chloride: 110 mEq/L (ref 96–112)
Creatinine, Ser: 1.52 mg/dL — ABNORMAL HIGH (ref 0.50–1.35)
GFR calc Af Amer: 57 mL/min — ABNORMAL LOW (ref >90)
GFR calc non Af Amer: 49 mL/min — ABNORMAL LOW (ref >90)
Potassium: 3.4 mEq/L — ABNORMAL LOW (ref 3.5–5.1)

## 2012-12-31 LAB — GLUCOSE, CAPILLARY

## 2012-12-31 MED ORDER — POTASSIUM CHLORIDE CRYS ER 20 MEQ PO TBCR
60.0000 meq | EXTENDED_RELEASE_TABLET | Freq: Once | ORAL | Status: AC
  Administered 2012-12-31: 60 meq via ORAL
  Filled 2012-12-31: qty 3

## 2012-12-31 MED ORDER — ENSURE PUDDING PO PUDG: 1.0000 | Freq: Three times a day (TID) | ORAL | Status: DC

## 2012-12-31 MED ORDER — IPRATROPIUM BROMIDE 0.02 % IN SOLN: 0.5000 mg | RESPIRATORY_TRACT | Status: DC | PRN

## 2012-12-31 MED ORDER — PANTOPRAZOLE SODIUM 40 MG PO TBEC: 40.0000 mg | DELAYED_RELEASE_TABLET | Freq: Every day | ORAL | Status: DC

## 2012-12-31 MED ORDER — RIVAROXABAN 20 MG PO TABS: 20.0000 mg | ORAL_TABLET | Freq: Every day | ORAL | Status: DC

## 2012-12-31 MED ORDER — INSULIN ASPART 100 UNIT/ML ~~LOC~~ SOLN: 5.0000 [IU] | Freq: Three times a day (TID) | SUBCUTANEOUS | Status: DC

## 2012-12-31 MED ORDER — ALBUTEROL SULFATE (5 MG/ML) 0.5% IN NEBU: 2.5000 mg | INHALATION_SOLUTION | RESPIRATORY_TRACT | Status: DC | PRN

## 2012-12-31 MED ORDER — INSULIN GLARGINE 100 UNIT/ML ~~LOC~~ SOLN: 23.0000 [IU] | Freq: Every day | SUBCUTANEOUS | Status: DC

## 2012-12-31 MED ORDER — RESOURCE THICKENUP CLEAR PO POWD: 1.0000 g | ORAL | Status: DC | PRN

## 2012-12-31 MED ORDER — AMLODIPINE BESYLATE 10 MG PO TABS: 10.0000 mg | ORAL_TABLET | Freq: Every day | ORAL | Status: DC

## 2012-12-31 MED ORDER — FUROSEMIDE 10 MG/ML IJ SOLN
40.0000 mg | Freq: Once | INTRAMUSCULAR | Status: AC
  Administered 2012-12-31: 10:00:00 40 mg via INTRAVENOUS
  Filled 2012-12-31: qty 4

## 2012-12-31 MED ORDER — INSULIN ASPART 100 UNIT/ML ~~LOC~~ SOLN: 0.0000 [IU] | Freq: Three times a day (TID) | SUBCUTANEOUS | Status: DC

## 2012-12-31 MED ORDER — INSULIN ASPART 100 UNIT/ML ~~LOC~~ SOLN: 0.0000 [IU] | Freq: Every day | SUBCUTANEOUS | Status: DC

## 2012-12-31 MED ORDER — RIVAROXABAN 15 MG PO TABS: 15.0000 mg | ORAL_TABLET | Freq: Two times a day (BID) | ORAL | Status: DC

## 2012-12-31 NOTE — Discharge Summary (Addendum)
Physician Discharge Summary  Juan Glenn ZOX:[id number] DOB: 01-20-1956 DOA: 12/18/2012  PCP: RECORD,CHARLES  Admit date: 12/18/2012 Discharge date: 01/01/2013   Addendum:  Patient was ready for discharge to SNF on 12/2 but remained awaiting insurance approval.  Now being discharged to home.    Time spent: 45 minutes  Recommendations for Outpatient Follow-up:  1. Patient with hypernatremia.  BMET on 12/9.   Encourage drinking water 2. Patient prone to pulmonary edema, fluid overload.  Monitor volume status. 3. F/u Diabetic management A1c 8.0.   4. Xeralto started for PE.  Please change from 15 mg bid to 20 mg daily on 12/16.  Discharge Diagnoses:  Principal Problem:   Acute respiratory failure Active Problems:   HTN (hypertension), malignant   DM (diabetes mellitus), type 2 with renal complications   Convulsions/seizures   Pulmonary embolism   CKD (chronic kidney disease) stage 3, GFR 30-59 ml/min   Aspiration pneumonia   Discharge Condition: stable, prone to aspiration  Diet recommendation: Carb Modified Dysphagia 3, thin liquids, aspiration precautions.  Filed Weights   12/30/12 0500 12/31/12 0546 01/01/13 0616  Weight: 85.276 kg (188 lb) 88.043 kg (194 lb 1.6 oz) 86.909 kg (191 lb 9.6 oz)    History of present illness:  57 y/o male with h/o DM, CKD 3 was admitted on 11/6 with seizure due to PRESS. Also diagnosed with aspiration pneumonia and discharged by the ICU service on Levaquin 11/18. Pt returned to the hospital the night after discharge with respiratory failure and was found to have b/l multifocal pulmonary emboli, as well as a righted sided DVT.  He was intubated on admission 11/19 and extubated on 11/23. He was transferred to the hospitalist service on 11/25 but due to recurrent aspiration and then pulm edema, he was not moved out of the ICU until 11/28.    SIGNIFICANT EVENTS / STUDIES:  11/19 Respiratory distress. Intubated.  11/19 CTA chest >>>  Bilateral PE, multifocal pneumonia  11/20 duplex > acute B LE DVT  11/21 echo done > EF 55-60, no right heart strain noted  11/21 with continued fevers, recultured.  11/21 1L bolus with hypotension>>improved  11/22 Renal US >> negative  11/23 Extubated  11/26- increased O2 requirements/ abdominal breathing and fevers- most likely repeat aspiration  11/27- pulm edema- diuresed  11/28- transferred to step down- did not require BiPAP over night but did need multiple neb treatments     Hospital Course:   Acute respiratory failure  -Due to PE superimposed on pneumonia  -Patient was intubated then extubated in ICU. -He subsequently required BPAP after aspiration. -Received Lasix and neb and placed back on Bipap. Then became over diuresed.  -Now resolved   Hypernatremia  -likely secondary to over diuresis.  Sodium was 158. -placed on d5 water. -now resolved Na was 144 on 12/2   Fever  -Low grade fever likely etiology is aspiration pneumonia  -10 days of vanc and zosyn has been administrated. Antibiotics are complete.  -resolved    Aspiration pneumonia  -recurrent episodes of aspiration and resultant hypoxia and fever on 11/27  -10 days of vanc and zosyn has been administrated. now resolved  -Serial swallow evals show improvement now D3 with thin liquids.  Pulmonary embolism  -Started Xarelto, received 9 days of Xeralto inpatient. -Patient with no further symptoms of respiratory distress. -Will need to adjust dosage of Xeralto appropriately to 20 mg after the 1st 21 days of therapy.  DM type 2 with renal complications  -Hemoglobin A1C 8.0  -  Changed from oral medications to insulin inpatient.   -Will resume oral meds amaryl and Jentadueto when discharged -f/u outpatient for diabetic control   Convulsions/seizures  -In setting of hypertensive emergency  -None since admission, no antiepileptic needed.   HTN  -admitted for PRESS with a seizure on December 05, 2012   -stablized on labetalol 300 BID  -added Norvasc to improve control  -controlled   CKD stage 3, GFR 30-59 ml/min  -stable   Encephalopathy  -started after seizures/ PRESS   -mental status improved but needs Skilled Rehab before being discharged to home.   Discharge Exam: Filed Vitals:   01/01/13 1453  BP: 111/74  Pulse: 90  Temp: 99 F (37.2 C)  Resp: 36    General: 57 yo male, A&O, weak, slightly tremulous, coughs easily. Cardiovascular: RRR, no murmur, rubs, gallops Respiratory: mild wheeze on lower right lobe, otherwise CTA Abdominal: soft, non-tender, +BS, non distended, no masses. Musculoskeletal: no cyanosis, clubbing, or edema bilateral lower extremities Neurological: no focal deficits. Alert and oriented x 3.   Discharge Instructions      Discharge Orders   Future Appointments Provider Department Dept Phone   01/21/2013 10:00 AM Glendale Chard, MD [facility] Neurology [facility] [phone]   Future Orders Complete By Expires   Call MD for:  difficulty breathing, headache or visual disturbances  As directed    Diet regular  As directed    Comments:     Carb Modified.  Dysphagia 3 with thin liquids. Encourage patient to drink water.   Increase activity slowly  As directed        Medication List    STOP taking these medications       levofloxacin 750 MG tablet  Commonly known as:  LEVAQUIN      TAKE these medications       albuterol (5 MG/ML) 0.5% nebulizer solution  Commonly known as:  PROVENTIL  Take 0.5 mLs (2.5 mg total) by nebulization every 4 (four) hours as needed for wheezing or shortness of breath.     amLODipine 10 MG tablet  Commonly known as:  NORVASC  Take 1 tablet (10 mg total) by mouth daily.     aspirin EC 81 MG tablet  Take 1 tablet (81 mg total) by mouth daily.     CLARITIN PO  Take 1 tablet by mouth daily as needed (allergies).     feeding supplement (ENSURE) Pudg  Take 1 Container by mouth 3 (three) times daily between  meals.     glimepiride 2 MG tablet  Commonly known as:  AMARYL  Take 1 tablet (2 mg total) by mouth 2 (two) times daily with a meal. Breakfast and dinner     ipratropium 0.02 % nebulizer solution  Commonly known as:  ATROVENT  Take 2.5 mLs (0.5 mg total) by nebulization every 4 (four) hours as needed for wheezing or shortness of breath.     labetalol 200 MG tablet  Commonly known as:  NORMODYNE  Take 1 tablet (200 mg total) by mouth 2 (two) times daily.     Linagliptin-Metformin HCl 2.05-998 MG Tabs  Commonly known as:  JENTADUETO  Take 1 tablet by mouth daily.     multivitamin with minerals Tabs tablet  Take 1 tablet by mouth daily.     pantoprazole 40 MG tablet  Commonly known as:  PROTONIX  Take 1 tablet (40 mg total) by mouth daily.     Rivaroxaban 15 MG Tabs tablet  Commonly known as:  XARELTO  Take 1 tablet (15 mg total) by mouth 2 (two) times daily with a meal.     Rivaroxaban 20 MG Tabs tablet  Commonly known as:  XARELTO  Take 1 tablet (20 mg total) by mouth daily with supper. Start taking 20 mg once a day after 12/15.  Start taking on:  01/14/2013     rosuvastatin 10 MG tablet  Commonly known as:  CRESTOR  Take 10 mg by mouth daily.       Follow-up Information   Follow up with RECORD,CHARLES On 01/07/2013. (1:15 pm to see Dr. Record)    Specialty:  Family Medicine   Contact information:   [address] [place] [address] [phone]        The results of significant diagnostics from this hospitalization (including imaging, microbiology, ancillary and laboratory) are listed below for reference.    Significant Diagnostic Studies: Dg Chest 2 View  12/29/2012   CLINICAL DATA:  Pulmonary edema I  EXAM: CHEST - 2 VIEW  COMPARISON:  12/26/2012  FINDINGS: Right IJ central line has been removed. Low lung volumes. Mild interstitial edema, significantly improved from previous exam. Mild residual consolidation or subsegmental atelectasis posteriorly  in both lower lobes. There may be tiny residual pleural effusions. Moderate cardiomegaly persists.  IMPRESSION: 1. Significant improvement in bilateral edema or infiltrates with mild residual as above. 2. Stable cardiomegaly.   Electronically Signed   By: Oley Balm M.D.   On: 12/29/2012 12:31   Ct Angio Chest Pe W/cm &/or Wo Cm  12/19/2012   CLINICAL DATA:  Evaluate for pulmonary embolism. The kidney a in shadow lobe breathing. Recent pneumonia.  EXAM: CT ANGIOGRAPHY CHEST WITH CONTRAST  TECHNIQUE: Multidetector CT imaging of the chest was performed using the standard protocol during bolus administration of intravenous contrast. Multiplanar CT image reconstructions including MIPs were obtained to evaluate the vascular anatomy.  CONTRAST:  80mL OMNIPAQUE IOHEXOL 350 MG/ML SOLN  COMPARISON:  Chest x-ray from yesterday  FINDINGS: THORACIC INLET/BODY WALL:  Endotracheal tube ends in the mid thoracic trachea.  MEDIASTINUM:  Positive for multi focal bilateral acute pulmonary embolism. One the most proximal clots is to the right lower lobe, essentially occlusive and involving the lateral and posterior basilar segments. There are additional right lower lobe and left upper lobe clots which are segmental sized. Suspect straightening of the interventricular septum. Mild hepatic venous reflux also noted. Normal heart size. No pericardial effusion. No mediastinal lymphadenopathy. Infundibula noted at the origins of the systemic pulmonary arteries bilaterally.  LUNG WINDOWS:  Patchy consolidative opacities in the bilateral lungs, most of which are not within the distribution of embolic disease. Some peripheral dense consolidative opacities in the left lower lobe may represent lung infarcts, although there are not clear arterial filling defects in this distribution. Trace right pleural effusion.  UPPER ABDOMEN:  There may be fatty liver, although contrast timing limits certainty.  OSSEOUS:  No acute fracture.  No  suspicious lytic or blastic lesions.  Critical Value/emergent results were called by telephone at the time of interpretation on 12/19/2012 at 12:56 AM to Dr.KONSTANTIN ZUBELEVITSKIY , who verbally acknowledged these results.  Review of the MIP images confirms the above findings.  IMPRESSION: 1. Acute bilateral, multifocal pulmonary embolism. There is likely related pulmonary hypertension/ right heart strain. 2. Multi focal pneumonia.   Electronically Signed   By: Tiburcio Pea M.D.   On: 12/19/2012 00:59   US Renal  12/21/2012   CLINICAL DATA:  Acute renal insufficiency  evaluate for obstruction  EXAM: RENAL/URINARY TRACT ULTRASOUND COMPLETE  COMPARISON:  None.  FINDINGS: Right Kidney  Length: 11.2 cm. Echogenicity is within normal limits. No mass or hydronephrosis. Cyst in the upper pole measures 1.3 x 0.6 x 1.1 cm.  Left Kidney  Length: 12.8 cm. Echogenicity within normal limits. No mass or hydronephrosis visualized. Cyst within the interpolar region measures 1.6 cm.  Bladder  Collapsed around a Foley catheter.  IMPRESSION: 1. No hydronephrosis.  2. Renal cysts.   Electronically Signed   By: Signa Kell M.D.   On: 12/21/2012 10:30   Dg Chest Port 1 View  12/26/2012   CLINICAL DATA:  Hypoxia, shortness of Breath.  EXAM: PORTABLE CHEST - 1 VIEW  COMPARISON:  12/24/2012  FINDINGS: Right IJ central line extends to the low SVC as before. Persistent low lung volumes. Moderate diffuse interstitial and alveolar opacities throughout both lungs. More focal airspace consolidation around the right hilum and in the medial right lower lung. Probable layering pleural effusions, right greater than left. Heart size upper limits normal for technique.  IMPRESSION: 1. Low volumes with persistent asymmetric infiltrate/edema and small effusions.   Electronically Signed   By: Oley Balm M.D.   On: 12/26/2012 10:37   Dg Chest Port 1 View  12/24/2012   CLINICAL DATA:  Pneumonia.  EXAM: PORTABLE CHEST - 1 VIEW   COMPARISON:  12/22/2012  FINDINGS: Increased densities in the right chest, particularly along the right lung periphery. Findings may represent a combination of pleural fluid and airspace disease. Slightly increased densities at the left lung base may represent volume loss. The endotracheal tube and nasogastric tube have been removed. Central line tip is in the lower SVC region.  IMPRESSION: Concern for increased airspace disease and pleural fluid in the right chest.  Low lung volumes.   Electronically Signed   By: Richarda Overlie M.D.   On: 12/24/2012 07:53   Dg Chest Port 1 View  12/22/2012   CLINICAL DATA:  Pulmonary edema and pneumonia.  EXAM: PORTABLE CHEST - 1 VIEW  COMPARISON:  12/21/2012  FINDINGS: Endotracheal tube remains with the tip approximately 6 cm above the carina. Nasogastric tube extends below the diaphragm. Central line positioning is stable in the lower SVC. Lungs show slightly improved expansion on the left. Persistent atelectasis versus infiltrate present at both lung bases, right greater than left. No overt edema or significant pleural fluid is identified. The heart size is stable.  IMPRESSION: Improved aeration of the left lung. Residual atelectasis versus infiltrates at both lung bases, right greater than left.   Electronically Signed   By: Irish Lack M.D.   On: 12/22/2012 08:34   Dg Chest Port 1 View  12/21/2012   CLINICAL DATA:  Pneumonia.  EXAM: PORTABLE CHEST - 1 VIEW  COMPARISON:  December 20, 2012.  FINDINGS: Endotracheal and nasogastric tubes are unchanged in position. Right internal jugular catheter line is stable in position. Stable cardiomediastinal silhouette. No pneumothorax or pleural effusion is noted. Stable bilateral basilar opacities are noted as well as stable right perihilar opacity.  IMPRESSION: Stable right perihilar and bilateral basilar opacities. Support apparatus unchanged.   Electronically Signed   By: Roque Lias M.D.   On: 12/21/2012 07:33   Dg Chest  Port 1 View  12/20/2012   CLINICAL DATA:  Central line placement  EXAM: PORTABLE CHEST - 1 VIEW  COMPARISON:  12/20/2012  FINDINGS: Low lung volumes. Right-sided internal jugular catheter with tip projecting in the regions superior  vena caval right atrial junction. Endotracheal tube with tip at a level clavicles. NG tube with tip not on the view of this study. There is prominence of the interstitial markings, indistinctness of the pulmonary vasculature, and peribronchial cuffing. Areas of increased density projects within the lung bases. Cardiac silhouette slightly upper limits of normal. Visualized osseous structures unremarkable. There is no evidence of pneumothorax.  IMPRESSION: 1. Support lines and tubes as described above patient is status post internal jugular catheter placement on the right without evidence of pneumothorax 2. No significant change in the pulmonary infiltrates.   Electronically Signed   By: Salome Holmes M.D.   On: 12/20/2012 15:38   Dg Chest Port 1 View  12/20/2012   CLINICAL DATA:  Pneumonia.  EXAM: PORTABLE CHEST - 1 VIEW  COMPARISON:  Chest CT 12/19/2012.  Chest x-ray 12/18/2012.  FINDINGS: Endotracheal tube in good anatomic position 4 cm above the carina. NG tube in good anatomic position. Patchy bilateral pulmonary infiltrates remain. Heart size and pulmonary vascularity normal. No pleural effusion or pneumothorax. No acute osseous abnormality.  IMPRESSION: 1. Interim placement of NG tube, its tip is below the left hemidiaphragm. Endotracheal tube in good anatomic position. 2. Persistent bilateral patchy pulmonary infiltrates, no significant change from prior exam.   Electronically Signed   By: Maisie Fus  Register   On: 12/20/2012 07:02   Dg Chest Portable 1 View  12/18/2012   CLINICAL DATA:  Evaluate endotracheal tube placement.  EXAM: PORTABLE CHEST - 1 VIEW  COMPARISON:  Chest radiograph December 18, 2012 at 2151 hr  FINDINGS: Endotracheal tube tip projects 3 cm above the  carina. Multiple EKG lines overlie the patient and may obscure subtle underlying pathology.Cardiomediastinal silhouette are normal for this low inspiratory portable examination crowded vasculature markings. Increasing right upper lobe, similar right perihilar airspace opacities, mild left lower lobe airspace opacity. No definite pleural effusions. No pneumothorax. Mildly gas distended stomach.  IMPRESSION: Endotracheal tube tip projects 3 cm above the equina.  Increasing alveolar airspace opacities concerning for pneumonia. Recommend followup chest radiograph after treatment to verify improvement.   Electronically Signed   By: Awilda Metro   On: 12/18/2012 23:26   Dg Chest Port 1 View  12/18/2012   CLINICAL DATA:  Shortness of breath, labored bleeding, diagnosed with pneumonia 2 weeks ago  EXAM: PORTABLE CHEST - 1 VIEW  COMPARISON:  12/16/2012; 01/12/2013; 12/11/2012  FINDINGS: Grossly unchanged borderline enlarged cardiac silhouette and mediastinal contours given persistently reduced lung volumes. The pulmonary vasculature remains indistinct with cephalization of flow. Worsening bibasilar heterogeneous airspace opacities. No definite pleural effusion or pneumothorax. Unchanged bones. Next the  IMPRESSION: Overall findings worrisome for recurrent multifocal infection and note, underlying pulmonary edema is not excluded. A follow-up chest radiograph in 4 to 6 weeks after treatment is recommended to ensure resolution.   Electronically Signed   By: Simonne Come M.D.   On: 12/18/2012 22:05   Dg Swallowing Func-speech Pathology  12/25/2012   Breck Coons [place], CCC-SLP     12/25/2012 11:15 AM Objective Swallowing Evaluation:   MBS Patient Details  Name: Juan Glenn MRN: [id number] Date of Birth: 05-17-1955  Today's Date: 12/25/2012 Time:  -     Past Medical History:  Past Medical History  Diagnosis Date  . Diabetes mellitus without complication 2009  . Renal disorder   . Hyperlipidemia   . Difficult  intubation    Past Surgical History:  Past Surgical History  Procedure Laterality Date  . No surgical history  09/2012   HPI:  57 yo discharged 11/19 after being treated for Enterobacter  aerogenes pneumonia complicated by VDRF, returned to [facility] ED same  day in respiratory distress.  Intubated. CTA chest revealed large  PE.  Extubated 11/23.  Dx resp failure, acute renal failure,  encephalopathy.  BSE recommends MBS to visualize swallow function  for safest recommendations.      Assessment / Plan / Recommendation Clinical Impression  Clinical impression: Pt. exhibited mild oral dysphagia caused by  lingual weakness with decreased manipulation and delayed transit.   Mild-moderate pharyngeal dysphagia is characterized by  sensorimotor deficits.  Decreased laryngeal elevation and  epiglottic closure leading to silent aspiration with thin,  laryngeal penetration with weak cough.  Chin tuck was not  consistently effective in minimizing aspiration risk with  recommendation of Dys 2 diet texture and nectar thick liquids,  pills whole in applesauce, no straws and full supervision and  assist.  ST will continue to treat pt.      Treatment Recommendation  Therapy as outlined in treatment plan below    Diet Recommendation Dysphagia 2 (Fine chop);Nectar-thick liquid   Liquid Administration via: Cup;No straw Medication Administration: Whole meds with puree Supervision: Patient able to self feed;Full supervision/cueing  for compensatory strategies Compensations: Slow rate;Small sips/bites;Check for pocketing Postural Changes and/or Swallow Maneuvers: Seated upright 90  degrees    Other  Recommendations Oral Care Recommendations: Oral care Q4  per protocol Other Recommendations: Order thickener from pharmacy   Follow Up Recommendations   (TBD)    Frequency and Duration min 2x/week  2 weeks   Pertinent Vitals/Pain WDL            Reason for Referral Objectively evaluate swallowing function   Oral Phase     Pharyngeal Phase Pharyngeal -  Nectar Pharyngeal - Nectar Teaspoon: Premature spillage to  valleculae;Delayed swallow initiation Pharyngeal - Nectar Cup: Delayed swallow initiation;Premature  spillage to valleculae Pharyngeal - Thin Pharyngeal - Thin Cup: Penetration/Aspiration during  swallow;Reduced laryngeal elevation;Delayed swallow  initiation;Premature spillage to pyriform sinuses;Premature  spillage to valleculae;Reduced airway/laryngeal closure Penetration/Aspiration details (thin cup): Material enters  airway, passes BELOW cords without attempt by patient to eject  out (silent aspiration);Material enters airway, CONTACTS cords  and not ejected out;Material enters airway, remains ABOVE vocal  cords and not ejected out Pharyngeal - Solids Pharyngeal - Regular: Pharyngeal residue - valleculae;Reduced  tongue base retraction  Cervical Esophageal Phase    GO              Breck Coons [place] M.Ed CCC-SLP Pager [phone]  12/25/2012    Microbiology: Recent Results (from the past 240 hour(s))  CLOSTRIDIUM DIFFICILE BY PCR     Status: None   Collection Time    12/26/12 10:37 AM      Result Value Range Status   C difficile by pcr NEGATIVE  NEGATIVE Final     Labs: Basic Metabolic Panel:  Recent Labs Lab 12/27/12 0334 12/28/12 1350 12/29/12 0545 12/30/12 0614 12/31/12 0400  NA 155* 157* 158* 158* 144  K 3.7 4.0 3.9 4.0 3.4*  CL 118* 120* 121* 125* 110  CO2 26 24 25 24 22   GLUCOSE 202* 356* 326* 206* 170*  BUN 19 24* 19 19 17   CREATININE 1.79* 1.73* 1.58* 1.60* 1.52*  CALCIUM 8.8 9.2 9.0 8.8 8.5  MG 2.2  --   --   --   --   PHOS 4.6  --   --   --   --  CBC:  Recent Labs Lab 12/26/12 0420 12/27/12 0334 12/28/12 0514 12/30/12 0614 12/31/12 0400  WBC 13.2* 12.9* 11.6* 9.9 10.3  HGB 9.0* 10.1* 10.3* 9.9* 10.3*  HCT 27.3* 30.4* 32.1* 30.7* 31.5*  MCV 88.1 87.4 89.7 89.5 86.8  PLT 349 364 312 241 234   BNP: BNP (last 3 results)  Recent Labs  12/05/12 0715 12/18/12 2151  PROBNP 85.4 342.7*    CBG:  Recent Labs Lab 12/31/12 1148 12/31/12 1729 12/31/12 2143 01/01/13 0735 01/01/13 1140  GLUCAP 193* 127* 170* 130* 178*       Signed:  Clydie Braun  [place], [place] [phone] [facility] 01/01/2013, 4:14 PM

## 2012-12-31 NOTE — Progress Notes (Signed)
Occupational Therapy Treatment Patient Details Name: Juan Glenn MRN: [id number] DOB: Jul 03, 1955 Today's Date: 12/31/2012 Time: 4098-1191 OT Time Calculation (min): 28 min  OT Assessment / Plan / Recommendation  History of present illness 57 yo discharged 11/19 after being treated for Enterobacter aerogenes pneumonia complicated by VDRF, returned to [facility] ED same day in respiratory distress.  Intubated. CTA chest revealed large PE. Extubated 11/23.   OT comments  Pt seemed to have difficulty today using R hand for functional tasks.  Pt is R handed and chose to brush teeth with L hand and wife states he has been feeding self with L hand despite being R handed.  Pt tests out ok with coordination tests with RUE but functionally drops items and struggles using RUE. Pt did state that his hand felt numb.  Nursing notified.  Follow Up Recommendations  SNF;Supervision/Assistance - 24 hour    Barriers to Discharge       Equipment Recommendations  None recommended by OT;Other (comment)    Recommendations for Other Services    Frequency Min 2X/week   Progress towards OT Goals Progress towards OT goals: Progressing toward goals  Plan Discharge plan needs to be updated    Precautions / Restrictions Precautions Precautions: Fall Restrictions Weight Bearing Restrictions: No   Pertinent Vitals/Pain No c/o pain.    ADL  Grooming: Performed;Teeth care;Wash/dry hands;Minimal assistance Where Assessed - Grooming: Supported standing Upper Body Dressing: Performed;Minimal assistance Where Assessed - Upper Body Dressing: Unsupported sitting Lower Body Dressing: Performed;Minimal assistance Where Assessed - Lower Body Dressing: Supported sit to stand Toilet Transfer: Performed;Min guard Statistician Method: Sit to stand;Stand pivot Acupuncturist: Materials engineer and Hygiene: Performed;Minimal assistance Where Assessed - Medical sales representative and Hygiene: Standing Equipment Used: Rolling walker Transfers/Ambulation Related to ADLs: Min guard to min A for balance, VC's and increased time for sequencing/processing.  As he fatigues, requires min A ADL Comments: Pt is R handed but is choosing to use L hand for most adls.  Pt was unable to hold onto small objects consistently with R hand dropping them in sink during adls.  Pt became confused brushing teeth putting toothpaste in the cap of toothpaste instead of on toothbrush.      OT Diagnosis:    OT Problem List:   OT Treatment Interventions:     OT Goals(current goals can now be found in the care plan section) Acute Rehab OT Goals OT Goal Formulation: With patient/family Time For Goal Achievement: 01/13/13 Potential to Achieve Goals: Good ADL Goals Pt Will Perform Grooming: with modified independence;sitting Pt Will Perform Upper Body Bathing: with set-up;sitting Pt Will Perform Lower Body Bathing: with modified independence;with supervision;sit to/from stand Pt Will Perform Upper Body Dressing: with set-up;sitting Pt Will Perform Lower Body Dressing: with modified independence;with supervision;sit to/from stand Pt Will Transfer to Toilet: with supervision;ambulating;bedside commode;regular height toilet Pt Will Perform Toileting - Clothing Manipulation and hygiene: with supervision;sit to/from stand Pt Will Perform Tub/Shower Transfer: Shower transfer;with supervision;shower seat;ambulating Additional ADL Goal #1: Pt will demonstrate improved activity tolerance as evidenced by ability to stand @ sink during ADL's x40min or more Additional ADL Goal #2: Pt will demonstrate increased UE strength as seen by ability to perform HEP at Mod I level  Visit Information  Last OT Received On: 12/31/12 Assistance Needed: +1 History of Present Illness: 57 yo discharged 11/19 after being treated for Enterobacter aerogenes pneumonia complicated by VDRF, returned to [facility] ED same day  in respiratory  distress.  Intubated. CTA chest revealed large PE. Extubated 11/23.    Subjective Data      Prior Functioning       Cognition  Cognition Arousal/Alertness: Awake/alert Behavior During Therapy: Flat affect Overall Cognitive Status: Impaired/Different from baseline Area of Impairment: Orientation;Attention;Safety/judgement;Problem solving Orientation Level: Disoriented to;Time Current Attention Level: Sustained Memory: Decreased short-term memory Safety/Judgement: Decreased awareness of safety Problem Solving: Slow processing;Requires verbal cues General Comments: Pt with poor sequencing and motory planning during adls.    Mobility  Bed Mobility Bed Mobility: Supine to Sit;Sitting - Scoot to Edge of Bed Rolling Right: 5: Supervision Right Sidelying to Sit: 5: Supervision;HOB flat Supine to Sit: 5: Supervision;HOB elevated;With rails Sitting - Scoot to Edge of Bed: 4: Min guard;With rail Details for Bed Mobility Assistance: Min guard for safety during scooting as pt was getting very close to EOB. Transfers Transfers: Sit to Stand;Stand to Sit Sit to Stand: 4: Min guard;From bed;With upper extremity assist Stand to Sit: 4: Min guard;To chair/3-in-1;With upper extremity assist Details for Transfer Assistance: Cues for hand placement and safety    Exercises      Balance Balance Balance Assessed: No   End of Session OT - End of Session Equipment Utilized During Treatment: Rolling walker Activity Tolerance: Patient limited by fatigue Patient left: in chair;with call bell/phone within reach;with family/visitor present Nurse Communication: Mobility status  GO     Hope Budds 12/31/2012, 12:40 PM [phone]

## 2012-12-31 NOTE — Clinical Social Work Note (Signed)
CSW received call from [place] with [employer] stating that [facility] authorization has not been obtained. Tresa Endo will call as soon as she gets authorization. [name] updated.  [name], [place], [place], [phone]

## 2012-12-31 NOTE — Progress Notes (Signed)
Speech Language Pathology Treatment: Dysphagia  Patient Details Name: Juan Glenn MRN: [id number] DOB: 03-22-1955 Today's Date: 12/31/2012 Time: 4098-1191 SLP Time Calculation (min): 13 min  Assessment / Plan / Recommendation Clinical Impression  Pt seen for f/u dysphagia treatment. Pt observed with Dys 3 textures and nectar thick liquids via cup. Baseline cough present before POs with similar coughing noted after all PO intake had stopped; suspect baseline cough, as cough was dry and vocal quality remained clear. Pt demonstrated adequate although minimally prolonged mastication and A/P transit of Dys 3 textures, and appears ready to advance. Recommend pt advance to Dys 3 textures with nectar thick liquids. Also recommend repeat MBS to assess readiness for liquid advancement given silent aspiration upon initial study.   HPI HPI: 57 yo discharged 11/19 after being treated for Enterobacter aerogenes pneumonia complicated by VDRF, returned to [facility] ED same day in respiratory distress.  Intubated. CTA chest revealed large PE.  Extubated 11/23.  Dx resp failure, acute renal failure, encephalopathy.  BSE recommends MBS to visualize swallow function for safest recommendations.    Pertinent Vitals N/A  SLP Plan  MBS    Recommendations Diet recommendations: Dysphagia 3 (mechanical soft);Nectar-thick liquid Liquids provided via: Cup;No straw Medication Administration: Whole meds with puree Supervision: Patient able to self feed;Full supervision/cueing for compensatory strategies Compensations: Slow rate;Small sips/bites;Check for pocketing Postural Changes and/or Swallow Maneuvers: Seated upright 90 degrees              Oral Care Recommendations: Oral care BID Follow up Recommendations: Skilled Nursing facility Plan: MBS    GO      Maxcine Ham, M.A. CCC-SLP [phone]  Maxcine Ham 12/31/2012, 11:19 AM

## 2012-12-31 NOTE — Discharge Summary (Signed)
Addendum  Patient seen and examined, chart and data base reviewed.  I agree with the above assessment and plan.  For full details please see Mrs. Algis Downs PA note.  Patient admitted to the hospital with severe due to press syndrome, was diagnosed with aspiration pneumonia.  Was admitted back to the hospital with DVT/PE and respiratory failure, being intubated.  He also suffered from hyponatremia secondary to overdiuresis.  Has dysphagia, likely secondary to it G./mechanical ventilation. Discharged on dysphagia 3 with nectar thick liquids. SLP to follow.   Clint Lipps, MD [facility] Pager: [phone] 12/31/2012, 2:07 PM

## 2013-01-01 ENCOUNTER — Inpatient Hospital Stay (HOSPITAL_COMMUNITY): Payer: BC Managed Care – PPO

## 2013-01-01 DIAGNOSIS — J690 Pneumonitis due to inhalation of food and vomit: Secondary | ICD-10-CM

## 2013-01-01 DIAGNOSIS — R509 Fever, unspecified: Secondary | ICD-10-CM

## 2013-01-01 LAB — GLUCOSE, CAPILLARY
Glucose-Capillary: 130 mg/dL — ABNORMAL HIGH (ref 70–99)
Glucose-Capillary: 178 mg/dL — ABNORMAL HIGH (ref 70–99)

## 2013-01-01 MED ORDER — RIVAROXABAN 20 MG PO TABS: 20.0000 mg | ORAL_TABLET | Freq: Every day | ORAL | Status: DC

## 2013-01-01 MED ORDER — RIVAROXABAN 15 MG PO TABS: 15.0000 mg | ORAL_TABLET | Freq: Two times a day (BID) | ORAL | Status: DC

## 2013-01-01 MED ORDER — ENSURE PUDDING PO PUDG: 1.0000 | Freq: Three times a day (TID) | ORAL | Status: DC

## 2013-01-01 MED ORDER — GLIMEPIRIDE 2 MG PO TABS: 2.0000 mg | ORAL_TABLET | Freq: Two times a day (BID) | ORAL | Status: DC

## 2013-01-01 MED ORDER — LINAGLIPTIN-METFORMIN HCL 2.5-1000 MG PO TABS: 1.0000 | ORAL_TABLET | Freq: Every day | ORAL | Status: DC

## 2013-01-01 MED ORDER — PANTOPRAZOLE SODIUM 40 MG PO TBEC: 40.0000 mg | DELAYED_RELEASE_TABLET | Freq: Every day | ORAL | Status: DC

## 2013-01-01 MED ORDER — AMLODIPINE BESYLATE 10 MG PO TABS: 10.0000 mg | ORAL_TABLET | Freq: Every day | ORAL | Status: DC

## 2013-01-01 NOTE — Procedures (Signed)
Objective Swallowing Evaluation: Modified Barium Swallowing Study  Patient Details  Name: Juan Glenn MRN: [id number] Date of Birth: 16-Oct-1955  Today's Date: 01/01/2013 Time: 0945-1000 SLP Time Calculation (min): 15 min  Past Medical History:  Past Medical History  Diagnosis Date  . Diabetes mellitus without complication 2009  . Renal disorder   . Hyperlipidemia   . Difficult intubation    Past Surgical History:  Past Surgical History  Procedure Laterality Date  . No surgical history  09/2012   HPI:  57 yo discharged 11/19 after being treated for Enterobacter aerogenes pneumonia complicated by VDRF, returned to [facility] ED same day in respiratory distress.  Intubated. CTA chest revealed large PE.  Extubated 11/23.  Dx resp failure, acute renal failure, encephalopathy.  MBS recommended for possible upgrade from thickened liquids.      Assessment / Plan / Recommendation Clinical Impression  Dysphagia Diagnosis: Mild pharyngeal phase dysphagia Clinical impression: MBS has significantly improved from previous assessment.  No penetration or aspiration exhibited.  Mild vallecular and pyriform sinus residue that decreased to minimal with spontaneous second swallow.  Recommend diet texture upgrade to regular and thin liquids, straws ok, pills with thin.  SLP will follow up briefly for safety and demonstration of swallow precautions.    Treatment Recommendation  Therapy as outlined in treatment plan below    Diet Recommendation Regular;Thin liquid   Liquid Administration via: Cup;Straw Medication Administration: Whole meds with liquid Supervision: Patient able to self feed;Intermittent supervision to cue for compensatory strategies Compensations: Slow rate;Small sips/bites Postural Changes and/or Swallow Maneuvers: Out of bed for meals;Seated upright 90 degrees    Other  Recommendations Oral Care Recommendations: Oral care BID   Follow Up Recommendations  None    Frequency and  Duration min 2x/week  2 weeks   Pertinent Vitals/Pain none       Reason for Referral Objectively evaluate swallowing function   Oral Phase Oral Preparation/Oral Phase Oral Phase: WFL Oral - Solids Oral - Regular: Within functional limits   Pharyngeal Phase Pharyngeal Phase Pharyngeal Phase: Impaired Pharyngeal - Nectar Pharyngeal - Nectar Teaspoon: Not tested Pharyngeal - Nectar Cup: Pharyngeal residue - valleculae;Pharyngeal residue - pyriform sinuses;Reduced tongue base retraction;Reduced laryngeal elevation Pharyngeal - Thin Pharyngeal - Thin Cup: Pharyngeal residue - valleculae;Pharyngeal residue - pyriform sinuses;Reduced laryngeal elevation;Reduced tongue base retraction Penetration/Aspiration details (thin cup): Material does not enter airway Pharyngeal - Solids Pharyngeal - Regular: Within functional limits  Cervical Esophageal Phase    GO    Cervical Esophageal Phase Cervical Esophageal Phase: [facility]         Darrow Bussing.Ed [employer] [phone]  01/01/2013

## 2013-01-01 NOTE — Progress Notes (Signed)
Juan Glenn to be D/C'd Home per MD order with Home Health PT/RN.  Discussed with the patient and patient's wife and all questions fully answered.    Medication List    STOP taking these medications       glimepiride 2 MG tablet  Commonly known as:  AMARYL     JENTADUETO 2.05-998 MG Tabs  Generic drug:  Linagliptin-Metformin HCl     levofloxacin 750 MG tablet  Commonly known as:  LEVAQUIN      TAKE these medications       albuterol (5 MG/ML) 0.5% nebulizer solution  Commonly known as:  PROVENTIL  Take 0.5 mLs (2.5 mg total) by nebulization every 4 (four) hours as needed for wheezing or shortness of breath.     amLODipine 10 MG tablet  Commonly known as:  NORVASC  Take 1 tablet (10 mg total) by mouth daily.     aspirin EC 81 MG tablet  Take 1 tablet (81 mg total) by mouth daily.     CLARITIN PO  Take 1 tablet by mouth daily as needed (allergies).     feeding supplement (ENSURE) Pudg  Take 1 Container by mouth 3 (three) times daily between meals.     insulin aspart 100 UNIT/ML injection  Commonly known as:  novoLOG  Inject 0-15 Units into the skin 3 (three) times daily with meals.     insulin aspart 100 UNIT/ML injection  Commonly known as:  novoLOG  Inject 0-5 Units into the skin at bedtime.     insulin aspart 100 UNIT/ML injection  Commonly known as:  novoLOG  Inject 5 Units into the skin 3 (three) times daily with meals.     insulin glargine 100 UNIT/ML injection  Commonly known as:  LANTUS  Inject 0.23 mLs (23 Units total) into the skin at bedtime.     ipratropium 0.02 % nebulizer solution  Commonly known as:  ATROVENT  Take 2.5 mLs (0.5 mg total) by nebulization every 4 (four) hours as needed for wheezing or shortness of breath.     labetalol 200 MG tablet  Commonly known as:  NORMODYNE  Take 1 tablet (200 mg total) by mouth 2 (two) times daily.     multivitamin with minerals Tabs tablet  Take 1 tablet by mouth daily.     pantoprazole 40 MG  tablet  Commonly known as:  PROTONIX  Take 1 tablet (40 mg total) by mouth daily.     RESOURCE THICKENUP CLEAR Powd  Take 1 g by mouth as needed.     Rivaroxaban 15 MG Tabs tablet  Commonly known as:  XARELTO  Take 1 tablet (15 mg total) by mouth 2 (two) times daily with a meal.     Rivaroxaban 20 MG Tabs tablet  Commonly known as:  XARELTO  Take 1 tablet (20 mg total) by mouth daily with supper.  Start taking on:  01/14/2013     rosuvastatin 10 MG tablet  Commonly known as:  CRESTOR  Take 10 mg by mouth daily.        VVS, Skin clean, dry and intact without evidence of skin break down, no evidence of skin tears noted. IV catheter discontinued intact. Site without signs and symptoms of complications. Dressing and pressure applied.  An After Visit Summary was printed and given to the patient. Patient escorted via WC, and D/C home via private auto.  Driggers, Rae Roam 01/01/2013 3:52 PM

## 2013-01-01 NOTE — Progress Notes (Signed)
[facility] PROGRESS NOTE  Juan Glenn OZH:[id number] DOB: 1955-07-28 DOA: 12/18/2012 PCP: RECORD,CHARLES  Assessment/Plan: Acute respiratory failure  -Due to PE superimposed on pneumonia   -now corrected and resolved  Hypernatremia  -likely secondary to over diuresis. Sodium was 158.  -now resolved Na was 144 on 12/2  Aspiration pneumonia  -recurrent episodes of aspiration and resultant hypoxia and fever on 11/27  -10 days of vanc and zosyn has been administrated. now resolved   Pulmonary embolism  -started Xarelto, received 8 days of Xeralto inpatient.  -Patient with no further symptoms of respiratory distress.  -Will need to adjust dosage of Xeralto appropriately to 20 mg after the 1st 21 days of therapy.   Family Communication: Plan discussed with his wife Disposition Plan: Discharge home today with home health services.       HPI/Subjective: 57 y/o male with h/o DM, CKD 3 was admitted on 11/6 with seizure due to PRESS. Also diagnosed with aspiration pneumonia and discharged by the ICU service on Levaquin 11/18. Pt returned to the hospital the night after discharge with respiratory failure and was found to have b/l multifocal pulmonary emboli, as well as a righted sided DVT. He was intubated on admission 11/19 and extubated on 11/23. He was transferred to the hospitalist service on 11/25 but due to recurrent aspiration and then pulm edema, he was not moved out of the ICU until 11/28.  He was discharged to home with home health on 01/01/13.     Objective: Filed Vitals:   01/01/13 1453  BP: 111/74  Pulse: 90  Temp: 99 F (37.2 C)  Resp: 36   No intake or output data in the 24 hours ending 01/01/13 1846 Filed Weights   12/30/12 0500 12/31/12 0546 01/01/13 0616  Weight: 85.276 kg (188 lb) 88.043 kg (194 lb 1.6 oz) 86.909 kg (191 lb 9.6 oz)    Exam:   General:  No acute distress, awake, alert  Cardiovascular: regular rate and rhythm, normal  S1S2  Respiratory: Lungs are clear, no wheezing or rales  Abdomen: Soft, nontender nondisteded  Musculoskeletal: no edema  Data Reviewed: Basic Metabolic Panel:  Recent Labs Lab 12/27/12 0334 12/28/12 1350 12/29/12 0545 12/30/12 0614 12/31/12 0400  NA 155* 157* 158* 158* 144  K 3.7 4.0 3.9 4.0 3.4*  CL 118* 120* 121* 125* 110  CO2 26 24 25 24 22   GLUCOSE 202* 356* 326* 206* 170*  BUN 19 24* 19 19 17   CREATININE 1.79* 1.73* 1.58* 1.60* 1.52*  CALCIUM 8.8 9.2 9.0 8.8 8.5  MG 2.2  --   --   --   --   PHOS 4.6  --   --   --   --    Liver Function Tests: No results found for this basename: AST, ALT, ALKPHOS, BILITOT, PROT, ALBUMIN,  in the last 168 hours No results found for this basename: LIPASE, AMYLASE,  in the last 168 hours No results found for this basename: AMMONIA,  in the last 168 hours CBC:  Recent Labs Lab 12/26/12 0420 12/27/12 0334 12/28/12 0514 12/30/12 0614 12/31/12 0400  WBC 13.2* 12.9* 11.6* 9.9 10.3  HGB 9.0* 10.1* 10.3* 9.9* 10.3*  HCT 27.3* 30.4* 32.1* 30.7* 31.5*  MCV 88.1 87.4 89.7 89.5 86.8  PLT 349 364 312 241 234   Cardiac Enzymes: No results found for this basename: CKTOTAL, CKMB, CKMBINDEX, TROPONINI,  in the last 168 hours BNP (last 3 results)  Recent Labs  12/05/12 0715 12/18/12 2151  PROBNP  85.4 342.7*   CBG:  Recent Labs Lab 12/31/12 1148 12/31/12 1729 12/31/12 2143 01/01/13 0735 01/01/13 1140  GLUCAP 193* 127* 170* 130* 178*    Recent Results (from the past 240 hour(s))  CLOSTRIDIUM DIFFICILE BY PCR     Status: None   Collection Time    12/26/12 10:37 AM      Result Value Range Status   C difficile by pcr NEGATIVE  NEGATIVE Final     Studies: Dg Swallowing Func-speech Pathology  01/01/2013   Breck Coons [place], CCC-SLP     01/01/2013 10:23 AM Objective Swallowing Evaluation: Modified Barium Swallowing Study   Patient Details  Name: Juan Glenn MRN: [id number] Date of Birth: [birth year]  Today's Date:  01/01/2013 Time: 0945-1000 SLP Time Calculation (min): 15 min  Past Medical History:  Past Medical History  Diagnosis Date  . Diabetes mellitus without complication 2009  . Renal disorder   . Hyperlipidemia   . Difficult intubation    Past Surgical History:  Past Surgical History  Procedure Laterality Date  . No surgical history  09/2012   HPI:  57 yo discharged 11/19 after being treated for Enterobacter  aerogenes pneumonia complicated by VDRF, returned to [facility] ED same  day in respiratory distress.  Intubated. CTA chest revealed large  PE.  Extubated 11/23.  Dx resp failure, acute renal failure,  encephalopathy.  MBS recommended for possible upgrade from  thickened liquids.      Assessment / Plan / Recommendation Clinical Impression  Dysphagia Diagnosis: Mild pharyngeal phase dysphagia Clinical impression: MBS has significantly improved from previous  assessment.  No penetration or aspiration exhibited.  Mild  vallecular and pyriform sinus residue that decreased to minimal  with spontaneous second swallow.  Recommend diet texture upgrade  to regular and thin liquids, straws ok, pills with thin.  SLP  will follow up briefly for safety and demonstration of swallow  precautions.    Treatment Recommendation  Therapy as outlined in treatment plan below    Diet Recommendation Regular;Thin liquid   Liquid Administration via: Cup;Straw Medication Administration: Whole meds with liquid Supervision: Patient able to self feed;Intermittent supervision  to cue for compensatory strategies Compensations: Slow rate;Small sips/bites Postural Changes and/or Swallow Maneuvers: Out of bed for  meals;Seated upright 90 degrees    Other  Recommendations Oral Care Recommendations: Oral care BID   Follow Up Recommendations  None    Frequency and Duration min 2x/week  2 weeks   Pertinent Vitals/Pain none       Reason for Referral Objectively evaluate swallowing function   Oral Phase Oral Preparation/Oral Phase Oral Phase: WFL Oral - Solids Oral  - Regular: Within functional limits   Pharyngeal Phase Pharyngeal Phase Pharyngeal Phase: Impaired Pharyngeal - Nectar Pharyngeal - Nectar Teaspoon: Not tested Pharyngeal - Nectar Cup: Pharyngeal residue -  valleculae;Pharyngeal residue - pyriform sinuses;Reduced tongue  base retraction;Reduced laryngeal elevation Pharyngeal - Thin Pharyngeal - Thin Cup: Pharyngeal residue - valleculae;Pharyngeal  residue - pyriform sinuses;Reduced laryngeal elevation;Reduced  tongue base retraction Penetration/Aspiration details (thin cup): Material does not  enter airway Pharyngeal - Solids Pharyngeal - Regular: Within functional limits  Cervical Esophageal Phase    GO    Cervical Esophageal Phase Cervical Esophageal Phase: [facility]         Darrow Bussing.Ed CCC-SLP Pager [phone]  01/01/2013    Scheduled Meds: . ipratropium  0.5 mg Nebulization TID   And  . albuterol  2.5 mg Nebulization TID  . amLODipine  10  mg Oral Daily  . aspirin  81 mg Oral Daily  . atorvastatin  20 mg Oral q1800  . feeding supplement (ENSURE)  1 Container Oral TID BM  . insulin aspart  0-15 Units Subcutaneous TID WC  . insulin aspart  0-5 Units Subcutaneous QHS  . insulin aspart  5 Units Subcutaneous TID WC  . insulin glargine  23 Units Subcutaneous QHS  . labetalol  300 mg Oral BID  . pantoprazole  40 mg Oral Daily  . Rivaroxaban  15 mg Oral BID WC  . [START ON 01/15/2013] rivaroxaban  20 mg Oral Q supper   Continuous Infusions:   Principal Problem:   Acute respiratory failure Active Problems:   HTN (hypertension), malignant   DM (diabetes mellitus), type 2 with renal complications   Convulsions/seizures   Pulmonary embolism   CKD (chronic kidney disease) stage 3, GFR 30-59 ml/min   Aspiration pneumonia    Time spent: 35 minutes    Jeralyn Bennett  [facility] Pager [phone]. If 7PM-7AM, please contact night-coverage at [email or web address], password [facility] 01/01/2013, 6:46 PM  LOS: 14 days

## 2013-01-01 NOTE — Progress Notes (Signed)
Physical Therapy Treatment Patient Details Name: Sherrick Araki MRN: [id number] DOB: 1955/12/20 Today's Date: 01/01/2013 Time: 4098-1191 PT Time Calculation (min): 24 min  PT Assessment / Plan / Recommendation  History of Present Illness 57 yo discharged 11/19 after being treated for Enterobacter aerogenes pneumonia complicated by VDRF, returned to [facility] ED same day in respiratory distress.  Intubated. CTA chest revealed large PE. Extubated 11/23.   PT Comments   Pt able to transfer with supervision, requires min A to gait without AD.  Pt with decreased activity tolerance without AD, educated pt/wife on use of RW for energy conservation at home, both express understanding.  Also discussed importance of supervision with mobility and physical assist with stairs at home at this time, both express understanding.  Follow Up Recommendations  CIR;Home health PT     Does the patient have the potential to tolerate intense rehabilitation     Barriers to Discharge        Equipment Recommendations  Rolling walker with 5" wheels    Recommendations for Other Services    Frequency Min 3X/week   Progress towards PT Goals Progress towards PT goals: Goals met and updated - see care plan  Plan Current plan remains appropriate    Precautions / Restrictions Precautions Precautions: Fall Restrictions Weight Bearing Restrictions: No   Pertinent Vitals/Pain No c/o pain    Mobility  Transfers Sit to Stand: 5: Supervision Stand to Sit: 5: Supervision Stand Pivot Transfers: 5: Supervision Details for Transfer Assistance: pt requires increased time but able to perform without LOB Ambulation/Gait Ambulation/Gait Assistance: 4: Min assist Ambulation Distance (Feet): 75 Feet Assistive device: None Ambulation/Gait Assistance Details: Gait training without AD for increasing strength and balance.  Pt min A with dynamic gait challenges of stop/start and direction changes.  Pt requires min A for balance  during gait, only able to gait 75' before requiring seated rest.  Pt able to gait 75' x 3 during session with seated rest breaks between.  Discussed use of RW for energy conservation, pt/wife express understanding. Stairs: Yes Stairs Assistance: 4: Min assist Stair Management Technique: One rail Right Number of Stairs: 2    Exercises     PT Diagnosis:    PT Problem List:   PT Treatment Interventions:     PT Goals (current goals can now be found in the care plan section)    Visit Information  Last PT Received On: 01/01/13 Assistance Needed: +1 History of Present Illness: 57 yo discharged 11/19 after being treated for Enterobacter aerogenes pneumonia complicated by VDRF, returned to [facility] ED same day in respiratory distress.  Intubated. CTA chest revealed large PE. Extubated 11/23.    Subjective Data      Cognition  Cognition Arousal/Alertness: Awake/alert Behavior During Therapy: Flat affect Problem Solving: Slow processing    Balance     End of Session PT - End of Session Equipment Utilized During Treatment: Gait belt Activity Tolerance: Patient tolerated treatment well Patient left: in chair;with call bell/phone within reach;with family/visitor present   GP     Chayna Surratt 01/01/2013, 1:55 PM

## 2013-01-02 DIAGNOSIS — I82401 Acute embolism and thrombosis of unspecified deep veins of right lower extremity: Secondary | ICD-10-CM | POA: Insufficient documentation

## 2013-01-02 DIAGNOSIS — R569 Unspecified convulsions: Secondary | ICD-10-CM | POA: Insufficient documentation

## 2013-01-02 DIAGNOSIS — I10 Essential (primary) hypertension: Secondary | ICD-10-CM | POA: Insufficient documentation

## 2013-01-02 NOTE — Clinical Social Work Placement (Signed)
Clinical Social Work Department CLINICAL SOCIAL WORK PLACEMENT NOTE 01/02/2013  Patient:  Juan Glenn, Juan Glenn  Account Number:  [id number] Admit date:  12/18/2012  Clinical Social Worker:  Cherre Blanc, [place]  Date/time:  12/30/2012 02:00 PM  Clinical Social Work is seeking post-discharge placement for this patient at the following level of care:   SKILLED NURSING   (*CSW will update this form in Epic as items are completed)   12/30/2012  Patient/family provided with [facility] Department of Clinical Social Work's list of facilities offering this level of care within the geographic area requested by the patient (or if unable, by the patient's family).  12/30/2012  Patient/family informed of their freedom to choose among providers that offer the needed level of care, that participate in Medicare, Medicaid or managed care program needed by the patient, have an available bed and are willing to accept the patient.  12/30/2012  Patient/family informed of [facility]' ownership interest in [facility], as well as of the fact that they are under no obligation to receive care at this facility.  PASARR submitted to EDS on 12/30/2012 PASARR number received from EDS on 12/30/2012  FL2 transmitted to all facilities in geographic area requested by pt/family on  12/30/2012 FL2 transmitted to all facilities within larger geographic area on   Patient informed that his/her managed care company has contracts with or will negotiate with  certain facilities, including the following:     Patient/family informed of bed offers received:  12/30/2012 Patient chooses bed at [facility] Physician recommends and patient chooses bed at    Patient to be transferred to [facility]   Patient to be transferred to facility by   The following physician request were entered in Epic:   Additional Comments: BCBS authorization not obtained in time for SNF  DC. DC to home is a safe DC plan. Patient DC home with HHPT services 01/01/13. CSW signing off.

## 2013-01-06 NOTE — Procedures (Signed)
Levy Pupa, MD, PhD 01/06/2013, 5:32 PM [facility] [phone] or if no answer [phone]

## 2013-01-21 ENCOUNTER — Encounter: Payer: Self-pay | Admitting: Neurology

## 2013-01-21 ENCOUNTER — Ambulatory Visit (INDEPENDENT_AMBULATORY_CARE_PROVIDER_SITE_OTHER): Payer: BC Managed Care – PPO | Admitting: Neurology

## 2013-01-21 VITALS — BP 126/76 | HR 78 | Temp 98.2°F | Resp 14 | Ht 69.0 in | Wt 194.6 lb

## 2013-01-21 DIAGNOSIS — R569 Unspecified convulsions: Secondary | ICD-10-CM

## 2013-01-21 LAB — BUN: BUN: 9 mg/dL (ref 6–23)

## 2013-01-21 MED ORDER — LEVETIRACETAM 500 MG PO TABS: 500.0000 mg | ORAL_TABLET | Freq: Two times a day (BID) | ORAL | Status: DC

## 2013-01-21 NOTE — Progress Notes (Signed)
[name] Neurology Division  Follow-up Visit   Date: 01/21/2013    Juan Glenn MRN: [id number] DOB: 07-24-1955   Interim History: Juan Glenn is a 57 year old African American gentleman with history of hyperlipidemia and diabetes returning to the clinic for follow-up of PRES. he was last seen in the clinic on 10/24/2012.  Since his last visit, he has unfortunately been hospitalized twice - seizures and pulmonary embolism.    11/6-11/18: Admittedwith  seizures in the setting of hypertensive emergency required intubation for airway protection. He received Ativan 2 mg x2 and loaded with fosphenytoin.  While in the ED, he was noted to have new onset atrial fibrillation with RVR.  He is also treated for aspiration pneumonia and discharged on Levaquin. Blood pressure was optimized with labetalol 200 mg twice daily and norvasc 10mg   11/19-12/03/2012: Patient presented to emergency room with respiratory failure and found to have bilateral multifocal pulmonary emboli and right sided DVT. He was intubated and on 11/19 and extubated 911/23. His hospital course was notable for pulmonary edema, hypernatremia, and respiratory insufficiency. He was started on Xeralto.  Since his hospitalization, he is getting home physical therapy and reports doing well.  He lost 22lb while hospitalized and has not regained his weight.  Blood pressure has been better managed.  No seizures since 11/6.  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  A few weeks ago, he noticed numbness over his finger which now involves the very tips of both hands.  No radicular symptoms or weakness.  History of present illness: He was admitted to [facility] on 8/7-8/9 where he was bought by ambulance after being found confused and unconscious at work. 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 Around 7:30pm, his vision started to get blurry and he felt nauseous  so wanted to go to the bathroom. He is required to change into/out of a protective gown to prevent contamination of the electrical parts so was on his way to get his gown off and collapsed inside the gowning room. His co-workers said he acted disoriented before going to the bathroom. He was found unconscious with a bleeding nose by his co-workers who called EMS. He said his tongue was sore at the tip and there was blood. No urinary or bladder incontinence. He did not remember collapsing, but recalls when EMS arrived. By the time he was transported to [facility], he was back to his baseline, including vision and mental status. He was then transferred to [facility] main campus for further evaluation. Blood pressure on arrival to [facility] ED was 139/80.   His work-up included EEG, MRI brain wwo contrast, CTA, echo, and laboratory resting. EEG was normal and MRI brain which showed abnormal patchy areas of T2 signal intensity involving bilateral temporal lobes, occipital lobes, parietal lobes, and left frontal lobe. Some of these areas demonstrate mild enhancement. There is minimal petechial hemorrhage in the left frontal lobe and right parietal lobe. His CTA showed mild intracranial atherosclerosis. No evidence for embolic source on echocardiogram. Patient remained clinically stable and was discharged with out-patient follow-up. His PCP reduced the dose of his lisinopril to 10mg  which he takes for renal protection because he was concerned this medication may have contributed to his presentation.   10/24/2012:  Patient doing great, no seizures. Lisinopril stopped due to concern of contributing to PRES. Repeat MRI brain on 10/09/2012 showed improving cortical and subcortical hyperintensities and without  enhancement.    Medications:  Current Outpatient Prescriptions on File Prior to Visit  Medication Sig Dispense Refill  . amLODipine (NORVASC) 10 MG tablet Take 1 tablet (10 mg total) by mouth daily.  30 tablet    . aspirin  EC 81 MG tablet Take 1 tablet (81 mg total) by mouth daily.      . feeding supplement, ENSURE, (ENSURE) PUDG Take 1 Container by mouth 3 (three) times daily between meals.  60 Container  3  . glimepiride (AMARYL) 2 MG tablet Take 1 tablet (2 mg total) by mouth 2 (two) times daily with a meal. Breakfast and dinner  60 tablet  3  . labetalol (NORMODYNE) 200 MG tablet Take 1 tablet (200 mg total) by mouth 2 (two) times daily.  60 tablet  3  . Linagliptin-Metformin HCl (JENTADUETO) 2.05-998 MG TABS Take 1 tablet by mouth daily.  30 tablet  3  . Loratadine (CLARITIN PO) Take 1 tablet by mouth daily as needed (allergies).      . Multiple Vitamin (MULTIVITAMIN WITH MINERALS) TABS tablet Take 1 tablet by mouth daily.      . pantoprazole (PROTONIX) 40 MG tablet Take 1 tablet (40 mg total) by mouth daily.  30 tablet  3  . Rivaroxaban (XARELTO) 20 MG TABS tablet Take 1 tablet (20 mg total) by mouth daily with supper. Start taking 20 mg once a day after 12/15.  30 tablet  5  . rosuvastatin (CRESTOR) 10 MG tablet Take 10 mg by mouth daily.       No current facility-administered medications on file prior to visit.    Allergies: No Known Allergies   Review of Systems:  CONSTITUTIONAL: No fevers, chills, night sweats, + weight loss.   EYES: No visual changes or eye pain ENT: No hearing changes.  No history of nose bleeds.   RESPIRATORY: No cough, wheezing and shortness of breath.   CARDIOVASCULAR: Negative for chest pain, and palpitations.   GI: Negative for abdominal discomfort, blood in stools or black stools.  No recent change in bowel habits.   GU:  No history of incontinence.   MUSCLOSKELETAL: No history of joint pain or swelling.  No myalgias.   SKIN: Negative for lesions, rash, and itching.   HEMATOLOGY/ONCOLOGY: Negative for prolonged bleeding, bruising easily, and swollen nodes.   ENDOCRINE: Negative for cold or heat intolerance, polydipsia or goiter.   PSYCH:  No depression or anxiety  symptoms.   NEURO: As Above.   Vital Signs:  BP 126/76  Pulse 78  Temp(Src) 98.2 F (36.8 C)  Resp 14  Ht 5\' 9"  (1.753 m)  Wt 194 lb 9.6 oz (88.27 kg)  BMI 28.72 kg/m2   General:  Well appearing, comfortable CV:  Regular rate and rhythm Ext:  No edema  Neurological Exam: MENTAL STATUS including orientation to time, place, person, recent and remote memory, attention span and concentration, language, and fund of knowledge is normal.  Speech is not dysarthric.  CRANIAL NERVES: II:  No visual field defects. Unremarkable fundi. III-IV-VI: Pupils equal round and reactive to light.  Normal conjugate, extra-ocular eye movements in all directions of gaze.  No nystagmus.  Mild ptosis at baseline.   V:  Normal facial sensation.   VII:  Normal facial symmetry and movements.  VIII:  Normal hearing and vestibular function.   IX-X:  Normal palatal movement.   XI:  Normal shoulder shrug and head rotation.   XII:  Normal tongue strength and range of motion,  no deviation or fasciculation.  MOTOR:  Motor strength is 5/5 in all extremities.  No atrophy, fasciculations or abnormal movements.  No pronator drift.  Tone is normal.    MSRs:  reflexes are 2+/4 throughout  SENSORY:  Normal and symmetric perception of light touch and vibration.    COORDINATION/GAIT: Normal finger-to- nose-finger and heel-to-shin.  Intact rapid alternating movements bilaterally.  Gait narrow based and stable. Tandem and stressed gait intact.   Data: MRI brain 10/09/2012: 1. Improving cortical and subcortical T2 hyperintensities this.  2. Previously seen intravascular enhancement is no longer present.  3. This most likely represents resolving posterior reversible encephalopathy or H D E. No definite encephalomalacia is evident to confirm previous infarcts.  4. 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    IMPRESSION: 1.  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     - Underlying etiology remains uncertain especially since his BP was not elevated and there was no other associated risk factors, but there have been some cases of PRES with lisinopril, although this would be my first case.  - Repeat MRI in September 2014 showed white matter changes involving the parietal and occipital lobes, with milder changes in the frontal and temporal regions, but overall they are improved and there is no longer enhancement of these lesions.   - Doing well off AEDs until 11/6 2.  Seizure due to hypertensive emergency  - Second GTC on 11/6 with clustering in the setting of hypertensive emergency. Required intubation  - Although this may have been a provoked seizure (elevated BP), the fact that it was prolonged and we still are not clear why he had his first seizure, I would like to start AEDs  - I would also like to repeat brain imaging  3.  Parethesias of his fingertips  - Improved, previously involved the proximal fingers.  ?medication effect  - Continue to follow clinically.  No sensory loss on exam 4.  Recent PE and DVT (situational)  - on xeralto 5.  Recent atrial fibrillation with RVR  - currently in NSR 6.  Hypertension  - on labetalol 200mg  BID and norvasc 10mg  daily 7.  Diabetes   PLAN/RECOMMENDATIONS:  1.  MRI brain wwo contrast 2.  Start Keppra 500mg  twice daily 3.  Seizure precautions discussed, including no driving for now.   4.  I had extensive discussion regarding the potential pathophysiology, management options, and natural course.   5.  Return to clinic in 97-month   The duration of this appointment visit was 45 minutes of face-to-face time with the patient.  Greater than 50% of this time was spent in counseling, explanation of diagnosis, planning of further management, and coordination of care.   Thank you for allowing me to participate in patient's care.  If I can answer any additional questions, I would be pleased to do so.     Sincerely,    Korin Setzler K. Allena Katz, DO

## 2013-01-21 NOTE — Patient Instructions (Addendum)
1.  Start Keppra 500mg  twice daily 2.  MRI brain wwo contrast is scheduled for Wednesday December 31st at 4:45pm first floor Radiology at [facility]. [id number]   3.  Seizure precautions discussed 4.  Return to clinic in 49-month  SEIZURE PRECAUTIONS  Bathroom Safety  A person with seizures may want to shower instead of bathe to avoid accidental drowning. If falls occur during the patient's typical seizure, a person should use a shower seat, preferably one with a safety strap.    Use nonskid strips in your shower or tub.    Never use electrical equipment near water. This prevents accidental electrocution.    Consider changing glass in shower doors to shatterproof glass.   Secondary school teacher   If possible, cook when someone else is nearby.    Use the back burners of the stove to prevent accidental burns.    Use shatterproof containers as much as possible. For instance, sauces can be transferred from glass bottles to plastic containers for use.    Limit time that is required using knives or other sharp objects. If possible, buy foods that are already cut, or ask someone to help in meal preparation.   General Safety at Home   Do not smoke or light fires in the fireplace unless someone else is present.    Do not use space heaters that can be accidentally overturned.    When alone, avoid using step stools or ladders, and do not clean rooftop gutters.    Purchase power tools and motorized Risk manager which have a safety switch that will stop the machine if you release the handle (a 'dead man's' switch).   Driving and Transportation   Avoid driving unless your seizures are well controlled and/or you have permission to drive from your state's Department of Motor Vehicles  [facility]). Each state has different laws. Please refer to the following link on the Epilepsy Foundation of America's website for more information: http://www.epilepsyfoundation.org/answerplace/Social/driving/drivingu.cfm     If you ride a bicycle, wear a helmet and any other necessary protective gear.    When taking public transportation like the bus or subway, stay clear of the platform edge.   Outdoor Product/process development scientist is okay, but does present certain risks. Never swim alone, and tell friends what to do if you have a seizure while swimming.    Wear appropriate protective equipment.    Ski with a friend. If a seizure occurs, your friend can seek help, if needed. He or she can also help to get you out of the cold. Consider using a safety hook or belt while riding the ski lift.

## 2013-01-29 ENCOUNTER — Ambulatory Visit (HOSPITAL_COMMUNITY)
Admission: RE | Admit: 2013-01-29 | Discharge: 2013-01-29 | Disposition: A | Discharge status: Home / Self Care | Payer: BC Managed Care – PPO | Source: Ambulatory Visit | Attending: Neurology | Admitting: Neurology

## 2013-01-29 DIAGNOSIS — F0390 Unspecified dementia without behavioral disturbance: Secondary | ICD-10-CM | POA: Insufficient documentation

## 2013-01-29 DIAGNOSIS — G9389 Other specified disorders of brain: Secondary | ICD-10-CM | POA: Insufficient documentation

## 2013-01-29 DIAGNOSIS — E785 Hyperlipidemia, unspecified: Secondary | ICD-10-CM | POA: Insufficient documentation

## 2013-01-29 DIAGNOSIS — R569 Unspecified convulsions: Secondary | ICD-10-CM | POA: Insufficient documentation

## 2013-01-29 DIAGNOSIS — I619 Nontraumatic intracerebral hemorrhage, unspecified: Secondary | ICD-10-CM | POA: Insufficient documentation

## 2013-01-29 MED ORDER — GADOBENATE DIMEGLUMINE 529 MG/ML IV SOLN
18.0000 mL | Freq: Once | INTRAVENOUS | Status: AC | PRN
  Administered 2013-01-29: 18 mL via INTRAVENOUS

## 2013-01-31 ENCOUNTER — Telehealth: Payer: Self-pay | Admitting: Neurology

## 2013-01-31 NOTE — Telephone Encounter (Signed)
I attempted to contact patient via phone today regarding the results of MRI brain, however there was no answer so a message was left for the patient to return my call.   Kendle Turbin K. Leontina Skidmore, DO   

## 2013-02-01 NOTE — Telephone Encounter (Signed)
Called patient's cell, home, and wife's cell phone to discuss MRI brain results, but there was no answer.  Messages were left to return my call.   Donika K. Posey Pronto, DO

## 2013-02-03 ENCOUNTER — Telehealth: Payer: Self-pay | Admitting: Neurology

## 2013-02-03 NOTE — Telephone Encounter (Signed)
Returning a call from Dr. Posey Pronto, says Dr. Posey Pronto called over the w/e. Sherri S.

## 2013-02-03 NOTE — Telephone Encounter (Signed)
Pt has called a 2nd time, says Dr. Posey Pronto called over the w/e / Juan Glenn

## 2013-02-03 NOTE — Telephone Encounter (Signed)
Patient's recent medical history is summarized below: Admission 11/6-11/18: Admitted with seizures in the setting of hypertensive emergency required intubation for airway protection. He received Ativan 2 mg x2 and loaded with fosphenytoin. CT head was negative.  While in the ED, he was noted to have new onset atrial fibrillation with RVR. He is also treated for aspiration pneumonia and discharged on Levaquin. Blood pressure was optimized with labetalol 200 mg twice daily and norvasc 10mg . He was discharged on Keppra 500 mg twice daily.   Admission 11/19-12/03/2012: Patient presented to emergency room with respiratory failure and found to have bilateral multifocal pulmonary emboli and right sided DVT. He was intubated and on 11/19 and extubated 911/23. His hospital course was notable for pulmonary edema, hypernatremia, and respiratory insufficiency. He was started on Xeralto for his PE and DVT.  Follow-up with neurology for seizures 01/21/2013: Clinically, the patient was doing very well. MRI of brain was ordered given his recent hospitalization for seizures. Imaging was performed on 01/29/2013 that showed several areas of white matter changes which had progressed since his previous MRI of brain in September 2014. In addition, there is a new left lobar subacute hematoma, which was not previously seen on his CT head dated 11/6.  Imaging was reviewed personally with radiology and changes are thought to be most consistent with PRES.  Subacute hematoma may have bee due elevated blood pressure and that he is on anticoagulation for PE/DVT.    Several attempts over the weekend and last week to contact the patient to discuss his MRI results, and patient returned call to office today.  Clinically, he is doing well.  I discussed the above findings with the patient and his primary care physician, Dr. Record, because I do not feel that he is safe taking Xeralto with known subacute intracranial hematoma since it cannot be  reserved.  Dr. Record would be managing his anticoagulation and would like the patient admitted for close observation as he is transitioned off Xeralto and to coumadin (keeping a narrow INR goal), which I think is reasonable.  Dr. Record requested that I initiate the admission, so I contacted the [facility] hospitalists and spoke with Dr. Oneita Jolly who recommended patient come to the Emergency Department for admission of anticoagulation management.  Patient is agreeable and will make arrangements this evening.  Dr. Record has also been updated of this plan.  Currently, there are no immediate neurological issues.  I will repeat CT scan in 2-weeks to reassess his hematoma.  If there are any questions, call me at [phone].  Cyani Kallstrom K. Posey Pronto, DO

## 2013-02-04 ENCOUNTER — Inpatient Hospital Stay (HOSPITAL_COMMUNITY)
Admission: EM | Admit: 2013-02-04 | Discharge: 2013-02-08 | DRG: 040 | Disposition: A | Discharge status: Home / Self Care | Payer: BC Managed Care – PPO | Attending: Family Medicine | Admitting: Family Medicine

## 2013-02-04 ENCOUNTER — Encounter (HOSPITAL_COMMUNITY): Payer: Self-pay | Admitting: Emergency Medicine

## 2013-02-04 ENCOUNTER — Emergency Department (HOSPITAL_COMMUNITY): Payer: BC Managed Care – PPO

## 2013-02-04 DIAGNOSIS — R9089 Other abnormal findings on diagnostic imaging of central nervous system: Secondary | ICD-10-CM

## 2013-02-04 DIAGNOSIS — I776 Arteritis, unspecified: Secondary | ICD-10-CM | POA: Diagnosis present

## 2013-02-04 DIAGNOSIS — I2699 Other pulmonary embolism without acute cor pulmonale: Secondary | ICD-10-CM

## 2013-02-04 DIAGNOSIS — I4891 Unspecified atrial fibrillation: Secondary | ICD-10-CM | POA: Diagnosis present

## 2013-02-04 DIAGNOSIS — E1129 Type 2 diabetes mellitus with other diabetic kidney complication: Secondary | ICD-10-CM | POA: Diagnosis present

## 2013-02-04 DIAGNOSIS — J9600 Acute respiratory failure, unspecified whether with hypoxia or hypercapnia: Secondary | ICD-10-CM

## 2013-02-04 DIAGNOSIS — N183 Chronic kidney disease, stage 3 unspecified: Secondary | ICD-10-CM | POA: Diagnosis present

## 2013-02-04 DIAGNOSIS — I619 Nontraumatic intracerebral hemorrhage, unspecified: Secondary | ICD-10-CM

## 2013-02-04 DIAGNOSIS — Z8249 Family history of ischemic heart disease and other diseases of the circulatory system: Secondary | ICD-10-CM

## 2013-02-04 DIAGNOSIS — T148XXA Other injury of unspecified body region, initial encounter: Secondary | ICD-10-CM | POA: Diagnosis present

## 2013-02-04 DIAGNOSIS — G40909 Epilepsy, unspecified, not intractable, without status epilepticus: Secondary | ICD-10-CM | POA: Diagnosis present

## 2013-02-04 DIAGNOSIS — S069X0A Unspecified intracranial injury without loss of consciousness, initial encounter: Secondary | ICD-10-CM

## 2013-02-04 DIAGNOSIS — Z86718 Personal history of other venous thrombosis and embolism: Secondary | ICD-10-CM

## 2013-02-04 DIAGNOSIS — I10 Essential (primary) hypertension: Secondary | ICD-10-CM | POA: Diagnosis present

## 2013-02-04 DIAGNOSIS — S06369A Traumatic hemorrhage of cerebrum, unspecified, with loss of consciousness of unspecified duration, initial encounter: Secondary | ICD-10-CM

## 2013-02-04 DIAGNOSIS — I129 Hypertensive chronic kidney disease with stage 1 through stage 4 chronic kidney disease, or unspecified chronic kidney disease: Secondary | ICD-10-CM | POA: Diagnosis present

## 2013-02-04 DIAGNOSIS — R55 Syncope and collapse: Secondary | ICD-10-CM

## 2013-02-04 DIAGNOSIS — E785 Hyperlipidemia, unspecified: Secondary | ICD-10-CM | POA: Diagnosis present

## 2013-02-04 DIAGNOSIS — Z833 Family history of diabetes mellitus: Secondary | ICD-10-CM

## 2013-02-04 DIAGNOSIS — R569 Unspecified convulsions: Secondary | ICD-10-CM

## 2013-02-04 DIAGNOSIS — N058 Unspecified nephritic syndrome with other morphologic changes: Secondary | ICD-10-CM

## 2013-02-04 DIAGNOSIS — S06890A Other specified intracranial injury without loss of consciousness, initial encounter: Secondary | ICD-10-CM

## 2013-02-04 DIAGNOSIS — Z86711 Personal history of pulmonary embolism: Secondary | ICD-10-CM

## 2013-02-04 DIAGNOSIS — Z7982 Long term (current) use of aspirin: Secondary | ICD-10-CM

## 2013-02-04 DIAGNOSIS — G9349 Other encephalopathy: Secondary | ICD-10-CM | POA: Diagnosis present

## 2013-02-04 DIAGNOSIS — J690 Pneumonitis due to inhalation of food and vomit: Secondary | ICD-10-CM

## 2013-02-04 DIAGNOSIS — E119 Type 2 diabetes mellitus without complications: Secondary | ICD-10-CM | POA: Diagnosis present

## 2013-02-04 DIAGNOSIS — R58 Hemorrhage, not elsewhere classified: Secondary | ICD-10-CM

## 2013-02-04 DIAGNOSIS — Z79899 Other long term (current) drug therapy: Secondary | ICD-10-CM

## 2013-02-04 DIAGNOSIS — J80 Acute respiratory distress syndrome: Secondary | ICD-10-CM

## 2013-02-04 DIAGNOSIS — S0689AA Other specified intracranial injury with loss of consciousness status unknown, initial encounter: Secondary | ICD-10-CM

## 2013-02-04 LAB — CBC WITH DIFFERENTIAL/PLATELET
BASOS PCT: 1 % (ref 0–1)
Basophils Absolute: 0 10*3/uL (ref 0.0–0.1)
EOS PCT: 2 % (ref 0–5)
Eosinophils Absolute: 0.1 10*3/uL (ref 0.0–0.7)
HCT: 39.1 % (ref 39.0–52.0)
Hemoglobin: 13.5 g/dL (ref 13.0–17.0)
LYMPHS ABS: 3.2 10*3/uL (ref 0.7–4.0)
Lymphocytes Relative: 39 % (ref 12–46)
MCH: 29.7 pg (ref 26.0–34.0)
MCHC: 34.5 g/dL (ref 30.0–36.0)
MCV: 85.9 fL (ref 78.0–100.0)
MONOS PCT: 9 % (ref 3–12)
Monocytes Absolute: 0.8 10*3/uL (ref 0.1–1.0)
Neutro Abs: 4.1 10*3/uL (ref 1.7–7.7)
Neutrophils Relative %: 50 % (ref 43–77)
Platelets: 213 10*3/uL (ref 150–400)
RBC: 4.55 MIL/uL (ref 4.22–5.81)
RDW: 13.8 % (ref 11.5–15.5)
WBC: 8.2 10*3/uL (ref 4.0–10.5)

## 2013-02-04 LAB — BASIC METABOLIC PANEL
BUN: 9 mg/dL (ref 6–23)
CALCIUM: 10 mg/dL (ref 8.4–10.5)
CO2: 24 mEq/L (ref 19–32)
Chloride: 101 mEq/L (ref 96–112)
Creatinine, Ser: 1.03 mg/dL (ref 0.50–1.35)
GFR, EST NON AFRICAN AMERICAN: 78 mL/min — AB (ref >90)
GLUCOSE: 75 mg/dL (ref 70–99)
Potassium: 4.5 mEq/L (ref 3.7–5.3)
Sodium: 140 mEq/L (ref 137–147)

## 2013-02-04 LAB — GLUCOSE, CAPILLARY
GLUCOSE-CAPILLARY: 71 mg/dL (ref 70–99)
GLUCOSE-CAPILLARY: 104 mg/dL — AB (ref 70–99)

## 2013-02-04 LAB — PROTIME-INR
INR: 1.23 (ref 0.00–1.49)
Prothrombin Time: 15.2 seconds (ref 11.6–15.2)

## 2013-02-04 LAB — APTT: aPTT: 37 seconds (ref 24–37)

## 2013-02-04 MED ORDER — LEVETIRACETAM 500 MG PO TABS
500.0000 mg | ORAL_TABLET | Freq: Two times a day (BID) | ORAL | Status: DC
  Administered 2013-02-05 – 2013-02-08 (×7): 500 mg via ORAL
  Filled 2013-02-04 (×10): qty 1

## 2013-02-04 MED ORDER — PANTOPRAZOLE SODIUM 40 MG PO TBEC
40.0000 mg | DELAYED_RELEASE_TABLET | Freq: Every day | ORAL | Status: DC
  Administered 2013-02-05 – 2013-02-08 (×4): 40 mg via ORAL
  Filled 2013-02-04 (×3): qty 1

## 2013-02-04 MED ORDER — ASPIRIN EC 81 MG PO TBEC: 81.0000 mg | DELAYED_RELEASE_TABLET | Freq: Every day | ORAL | Status: DC

## 2013-02-04 MED ORDER — ATORVASTATIN CALCIUM 20 MG PO TABS
20.0000 mg | ORAL_TABLET | Freq: Every day | ORAL | Status: DC
  Administered 2013-02-04 – 2013-02-07 (×4): 20 mg via ORAL
  Filled 2013-02-04 (×5): qty 1

## 2013-02-04 MED ORDER — LORATADINE 10 MG PO TABS
10.0000 mg | ORAL_TABLET | Freq: Every day | ORAL | Status: DC | PRN
  Filled 2013-02-04: qty 1

## 2013-02-04 MED ORDER — LABETALOL HCL 200 MG PO TABS
200.0000 mg | ORAL_TABLET | Freq: Two times a day (BID) | ORAL | Status: DC
  Administered 2013-02-05 – 2013-02-08 (×7): 200 mg via ORAL
  Filled 2013-02-04 (×9): qty 1

## 2013-02-04 MED ORDER — SODIUM CHLORIDE 0.9 % IV SOLN
INTRAVENOUS | Status: DC
  Administered 2013-02-04: 20 mL/h via INTRAVENOUS

## 2013-02-04 MED ORDER — AMLODIPINE BESYLATE 10 MG PO TABS
10.0000 mg | ORAL_TABLET | Freq: Every day | ORAL | Status: DC
  Administered 2013-02-05 – 2013-02-08 (×4): 10 mg via ORAL
  Filled 2013-02-04 (×4): qty 1

## 2013-02-04 MED ORDER — INSULIN ASPART 100 UNIT/ML ~~LOC~~ SOLN
0.0000 [IU] | Freq: Three times a day (TID) | SUBCUTANEOUS | Status: DC
  Administered 2013-02-05: 3 [IU] via SUBCUTANEOUS
  Administered 2013-02-06: 2 [IU] via SUBCUTANEOUS

## 2013-02-04 NOTE — ED Notes (Signed)
Attempted to call report, nurse unavailable.

## 2013-02-04 NOTE — Consult Note (Signed)
NEURO HOSPITALIST CONSULT NOTE    Reason for Consult: Medication recommendations.  HPI:                                                                                                                                          Juan Glenn is an 58 y.o. male who was admitted 11/19-12/03/2012: Patient presented to emergency room with respiratory failure and found to have bilateral multifocal pulmonary emboli and right sided DVT. He was intubated and on 11/19 and extubated 911/23. His hospital course was notable for pulmonary edema, hypernatremia, and respiratory insufficiency. He was started on Xeralto for his PE and DVT.  Workup while in hospital included EEG, MRI brain wwo contrast, CTA, echo, and laboratory resting. EEG was normal and MRI brain which showed abnormal patchy areas of T2 signal intensity involving bilateral temporal lobes, occipital lobes, parietal lobes, and left frontal lobe. Some of these areas demonstrate mild enhancement. There is minimal petechial hemorrhage in the left frontal lobe and right parietal lobe. His CTA showed mild intracranial atherosclerosis. No evidence for embolic source on echocardiogram.  Per neurology note, " Imaging was performed on 01/29/2013 that showed several areas of white matter changes which had progressed since his previous MRI of brain in September 2014. In addition, there is a new left lobar subacute hematoma, which was not previously seen on his CT head dated 11/6. Imaging was reviewed personally with radiology and changes are thought to be most consistent with PRES. Subacute hematoma may have bee due elevated blood pressure and that he is on anticoagulation for PE/DVT. "  Dr Posey Pronto (neurology ) discussed the above findings with the patient and his primary care physician, Dr. Record, because I do not feel that he is safe taking Xeralto with known subacute intracranial hematoma since it cannot be reserved. Dr. Record would be  managing his anticoagulation and would like the patient admitted for close observation as he is transitioned off Xeralto and to coumadin (keeping a narrow INR goal), which I think is reasonable. Patient was brought to ED and neurology was asked to see patient, follow patient, and give further recommendations while in hospital.        Past Medical History  Diagnosis Date  . Diabetes mellitus without complication 1155  . Renal disorder   . Hyperlipidemia   . Difficult intubation   . Pulmonary embolism   . PRES (posterior reversible encephalopathy syndrome) 09/2012    Past Surgical History  Procedure Laterality Date  . No surgical history  09/2012    Family History  Problem Relation Age of Onset  . Diabetes Mother   . Congestive Heart Failure Mother     Died, 45s  . Heart attack Father     Died, 9  . Diabetes Brother   .  Hypertension Sister   . Cancer Brother     Died, 64     Social History:  reports that he has never smoked. He has never used smokeless tobacco. He reports that he does not drink alcohol or use illicit drugs.  Allergies  Allergen Reactions  . Lisinopril     MD thinks that this medication caused patient to "pass out" "faint".     MEDICATIONS:                                                                                                                     Current Facility-Administered Medications  Medication Dose Route Frequency Provider Last Rate Last Dose  . 0.9 %  sodium chloride infusion   Intravenous Continuous Rosemarie Ax, MD      . Derrill Memo ON 02/05/2013] amLODipine (NORVASC) tablet 10 mg  10 mg Oral Daily Rosemarie Ax, MD      . atorvastatin (LIPITOR) tablet 20 mg  20 mg Oral q1800 Rosemarie Ax, MD      . insulin aspart (novoLOG) injection 0-15 Units  0-15 Units Subcutaneous TID WC Rosemarie Ax, MD      . labetalol (NORMODYNE) tablet 200 mg  200 mg Oral BID Rosemarie Ax, MD      . levETIRAcetam (KEPPRA) tablet 500 mg  500 mg Oral  BID Rosemarie Ax, MD      . loratadine (CLARITIN) tablet 10 mg  10 mg Oral Daily PRN Rosemarie Ax, MD      . pantoprazole (PROTONIX) EC tablet 40 mg  40 mg Oral Daily Rosemarie Ax, MD          ROS:                                                                                                                                       History obtained from the patient  General ROS: negative for - chills, fatigue, fever, night sweats, weight gain or weight loss Psychological ROS: negative for - behavioral disorder, hallucinations, memory difficulties, mood swings or suicidal ideation Ophthalmic ROS: negative for - blurry vision, double vision, eye pain or loss of vision ENT ROS: negative for - epistaxis, nasal discharge, oral lesions, sore throat, tinnitus or vertigo Allergy and Immunology ROS: negative for - hives or itchy/watery eyes Hematological and Lymphatic ROS: negative for - bleeding problems, bruising or swollen lymph nodes  Endocrine ROS: negative for - galactorrhea, hair pattern changes, polydipsia/polyuria or temperature intolerance Respiratory ROS: negative for - cough, hemoptysis, shortness of breath or wheezing Cardiovascular ROS: negative for - chest pain, dyspnea on exertion, edema or irregular heartbeat Gastrointestinal ROS: negative for - abdominal pain, diarrhea, hematemesis, nausea/vomiting or stool incontinence Genito-Urinary ROS: negative for - dysuria, hematuria, incontinence or urinary frequency/urgency Musculoskeletal ROS: negative for - joint swelling or muscular weakness Neurological ROS: as noted in HPI Dermatological ROS: negative for rash and skin lesion changes   Blood pressure 153/84, pulse 76, temperature 97.7 F (36.5 C), temperature source Oral, resp. rate 18, height '5\' 9"'  (1.753 m), weight 80 kg (176 lb 5.9 oz), SpO2 95.00%.   Neurologic Examination:                                                                                                       Mental Status: Alert, oriented, thought content appropriate.  Speech fluent without evidence of aphasia.  Able to follow 3 step commands without difficulty. Cranial Nerves: II: Discs flat bilaterally; Visual fields grossly normal, pupils equal, round, reactive to light and accommodation III,IV, VI: ptosis not present, extra-ocular motions intact bilaterally V,VII: smile symmetric, facial light touch sensation normal bilaterally VIII: hearing normal bilaterally IX,X: gag reflex present XI: bilateral shoulder shrug XII: midline tongue extension without atrophy or fasciculations  Motor: Right : Upper extremity   5/5    Left:     Upper extremity   5/5  Lower extremity   5/5     Lower extremity   5/5 Tone and bulk:normal tone throughout; no atrophy noted Sensory: Pinprick and light touch intact throughout, bilaterally Deep Tendon Reflexes:  Right: Upper Extremity   Left: Upper extremity   biceps (C-5 to C-6) 2/4   biceps (C-5 to C-6) 2/4 tricep (C7) 2/4    triceps (C7) 2/4 Brachioradialis (C6) 2/4  Brachioradialis (C6) 2/4  Lower Extremity Lower Extremity  quadriceps (L-2 to L-4) 2/4   quadriceps (L-2 to L-4) 2/4 Achilles (S1) 2/4   Achilles (S1) 2/4  Plantars: Right: downgoing   Left: downgoing Cerebellar: normal finger-to-nose,  normal heel-to-shin test Gait: normal CV: pulses palpable throughout    Lab Results  Component Value Date/Time   CHOL 110 09/07/2012  5:40 AM   MRI 01/21/13 FINDINGS:  New 15 x 14 mm focus of low T2, bright T1 signal with susceptibility  artifact within the left posterior temporal lobe, most consistent  with subacute hematoma with surrounding FLAIR hyperintense vasogenic  edema. He patchy cortical of the subcortical white matter FLAIR T2  hyperintensities throughout the supratentorial brain progressed,  with new mesial temporal lobe focus, new right frontal lobe and  right para cingulate, left precentral involvement. Right occipital   hemorrhagic encephalomalacia again noted with hemorrhage. Similar  left common new right punctate foci of susceptibility artifact, with  more apparent appearance of bifrontal and right temporal punctate  micro hemorrhages.  The ventricles and sulci are normal for patient's age. No midline  shift. Faint enhancement within the left frontal lobe associated  with  the FLAIR abnormality. ; faint peripheral enhancement about the  left temporal hematoma. No reduced diffusion to suggest acute  ischemia.  No abnormal extra-axial fluid collections, leptomeningeal  enhancement nor extra-axial masses. Normal major intracranial  vascular flow voids observed at the skull base. The paranasal sinus  are well aerated. Trace fluid signal within the left mastoid tip.  Ocular globes and orbital contents are nonsuspicious though not  tailored for evaluation. Craniocervical junction maintained. No  abnormal sellar expansion.  IMPRESSION:  Progressed peripheral supratentorial gray-white matter FLAIR  abnormality's, including left frontal lesion which shows faint  enhancement. In addition, new left posterior temporal 15 x 14 mm he  subacute hematoma. Additional supratentorial micro hemorrhages.  Constellation of findings may reflect chronic severe hypertension,  with subsequent lobar hematoma. Scattered micro hemorrhages can also  be seen with amyloid angiopathy. The FLAIR abnormality's may reflect  superimposed PRES. Overall progression of disease process.  No atrophy.  Electronically Signed  By: Elon Alas  On: 01/29/2013 19:31   Results for orders placed during the hospital encounter of 02/04/13 (from the past 48 hour(s))  CBC WITH DIFFERENTIAL     Status: None   Collection Time    02/04/13 11:36 AM      Result Value Range   WBC 8.2  4.0 - 10.5 K/uL   RBC 4.55  4.22 - 5.81 MIL/uL   Hemoglobin 13.5  13.0 - 17.0 g/dL   HCT 39.1  39.0 - 52.0 %   MCV 85.9  78.0 - 100.0 fL   MCH 29.7  26.0 -  34.0 pg   MCHC 34.5  30.0 - 36.0 g/dL   RDW 13.8  11.5 - 15.5 %   Platelets 213  150 - 400 K/uL   Neutrophils Relative % 50  43 - 77 %   Neutro Abs 4.1  1.7 - 7.7 K/uL   Lymphocytes Relative 39  12 - 46 %   Lymphs Abs 3.2  0.7 - 4.0 K/uL   Monocytes Relative 9  3 - 12 %   Monocytes Absolute 0.8  0.1 - 1.0 K/uL   Eosinophils Relative 2  0 - 5 %   Eosinophils Absolute 0.1  0.0 - 0.7 K/uL   Basophils Relative 1  0 - 1 %   Basophils Absolute 0.0  0.0 - 0.1 K/uL  BASIC METABOLIC PANEL     Status: Abnormal   Collection Time    02/04/13 11:36 AM      Result Value Range   Sodium 140  137 - 147 mEq/L   Potassium 4.5  3.7 - 5.3 mEq/L   Chloride 101  96 - 112 mEq/L   CO2 24  19 - 32 mEq/L   Glucose, Bld 75  70 - 99 mg/dL   BUN 9  6 - 23 mg/dL   Creatinine, Ser 1.03  0.50 - 1.35 mg/dL   Calcium 10.0  8.4 - 10.5 mg/dL   GFR calc non Af Amer 78 (*) >90 mL/min   GFR calc Af Amer >90  >90 mL/min   Comment: (NOTE)     The eGFR has been calculated using the CKD EPI equation.     This calculation has not been validated in all clinical situations.     eGFR's persistently <90 mL/min signify possible Chronic Kidney     Disease.  APTT     Status: None   Collection Time    02/04/13 11:36 AM      Result Value Range  aPTT 37  24 - 37 seconds   Comment:            IF BASELINE aPTT IS ELEVATED,     SUGGEST PATIENT RISK ASSESSMENT     BE USED TO DETERMINE APPROPRIATE     ANTICOAGULANT THERAPY.  PROTIME-INR     Status: None   Collection Time    02/04/13 11:36 AM      Result Value Range   Prothrombin Time 15.2  11.6 - 15.2 seconds   INR 1.23  0.00 - 1.49  GLUCOSE, CAPILLARY     Status: None   Collection Time    02/04/13  4:43 PM      Result Value Range   Glucose-Capillary 71  70 - 99 mg/dL    Mr Brain Wo Contrast  02/04/2013   CLINICAL DATA:  58 year old male with recent intracranial hemorrhage, suspected posterior reversible encephalopathy syndrome. History of hypertension and diabetes.  History of DVT and PE, being switched from xarelto to Coumadin. Originally presented with seizure in August.  EXAM: MRI HEAD WITHOUT CONTRAST  TECHNIQUE: Multiplanar, multiecho pulse sequences of the brain and surrounding structures were obtained without intravenous contrast.  COMPARISON:  01/29/2013 and earlier.  FINDINGS: 2-3 mm focus of restricted diffusion suggested in the anterior right frontal lobe subcortical white matter, but this is in the vicinity of an area of chronic blood products (series 8, image 101) and may simply represent blood related diffusion susceptibility artifact. Similar artifact is noted with the parenchymal left temporal lobe hemorrhage. That hemorrhage appears stable in size and configuration.  Numerous chronic micro hemorrhages throughout the brain (series 8, 801), including a previous macroscopic hemorrhage in the right occipital lobe probably similar to that now seen in the left temporal lobe.  No other areas of restricted diffusion. Major intracranial vascular flow voids are stable.  Gray and white matter T2 and FLAIR hyperintensity scattered in both cerebral hemispheres has not significantly changed allowing for differences in technique (3 tesla imaging today). Stable mild edema without significant mass effect in the left temporal lobe. No new areas of T2 or FLAIR signal abnormality.  Stable and negative pituitary, cervicomedullary junction and visualized cervical spine. Normal bone marrow signal. Visible internal auditory structures appear normal. Visualized orbit soft tissues are within normal limits. Negative scalp soft tissues.  IMPRESSION: 1. Possible punctate area of restricted diffusion in the anterior right frontal lobe subcortical white matter such as due to acute lacunar infarct. However, this might also be imaging artifact related to nearby chronic blood products. 2. No acute mass effect or new intracranial hemorrhage. Stable left temporal lobe hemorrhage. Advanced chronic  underlying mostly micro hemorrhages in the brain elsewhere. 3. 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 4. Top differential considerations to consider in addition to PRES include an underlying coagulopathy (such as protein C deficiency or other hypercoagulable state) and chronic vasculitis such as due to connective tissue disorder.   Electronically Signed   By: Lars Pinks M.D.   On: 02/04/2013 15:23      Etta Quill PA-C [facility] [phone]  02/04/2013, 5:22 PM   Assessment/Plan: 58 YO male who is on Xeralto for DVT and PE prevention.  Patient brought to hospital due to primary care desire to switch patient from [facility] to Coumadin. On recent MRI patient has stable left temporal lobe hemorrhage with additional numerous micro hemorrhages throughout the brain which  would place him at risk for further bleeding. Given his risk of both DVT/PE and further intracranial bleeds, would highly recommend consideration of Greenfield filter and stopping AC.  Will have stroke team follow in AM to make further recommendations.    Assessment and plan per attending neurologist   Patient seen and examined together with physician assistant and I concur with the assessment and plan.  Dorian Pod, MD

## 2013-02-04 NOTE — ED Provider Notes (Addendum)
CSN: [id number]     Arrival date & time 02/04/13  1173 History   First MD Initiated Contact with Patient 02/04/13 1105     Chief Complaint  Patient presents with  . Medication Management   (Consider location/radiation/quality/duration/timing/severity/associated sxs/prior Treatment) HPI Patient reports he was admitted on November 6 for pneumonia however looking at his chart he had a syncopal episode and had a very elevated blood pressure. He was discharged on November 19 and returned back on the 19th with acute respiratory failure and sepsis. He was diagnosed with DVT and PE. He was discharged on December 3. He reports he been seen by Dr. Allena Katz, neurologist after a syncopal episode in August. However in reviewing his charts he had seizures which patient did not relate to me. She saw him for followup at the end of December and ordered an outpatient MRI of the brain which was done December 31st, he reports she had called and left a message 4 days ago, 3 days ago, and then yesterday. He was able to contact her yesterday. Evidently he had some new changes on his MRI and after discussion with his PCP they advised him to come to the ED to be admitted to be taken off of his xarelto and to be placed on Coumadin. Patient states he feels fine. He denies headache, numbness in his extremities, visual changes including blurred vision, any difficulty speaking, nausea, vomiting, or dizziness. Patient reports he had been placed on the Cipro to "protect my kidneys" however when he had the syncopal episode in August, which was probably actually seizure, he was taken off his lisinopril. He states he has never been told he had high blood pressure until the admission in November.    PCP Dr Record in [facility] Neurology Dr Allena Katz  Past Medical History  Diagnosis Date  . Diabetes mellitus without complication 2009  . Renal disorder   . Hyperlipidemia   . Difficult intubation   . Pulmonary embolism   . PRES  (posterior reversible encephalopathy syndrome) 09/2012   Past Surgical History  Procedure Laterality Date  . No surgical history  09/2012   Family History  Problem Relation Age of Onset  . Diabetes Mother   . Congestive Heart Failure Mother     Died, 80s  . Heart attack Father     Died, 1  . Diabetes Brother   . Hypertension Sister   . Cancer Brother     Died, 54   History  Substance Use Topics  . Smoking status: Never Smoker   . Smokeless tobacco: Never Used  . Alcohol Use: No  lives at home Lives with spouse On FMLA from work  Review of Systems  All other systems reviewed and are negative.    Allergies  Lisinopril  Home Medications   Current Outpatient Rx  Name  Route  Sig  Dispense  Refill  . amLODipine (NORVASC) 10 MG tablet   Oral   Take 1 tablet (10 mg total) by mouth daily.   30 tablet      . aspirin EC 81 MG tablet   Oral   Take 1 tablet (81 mg total) by mouth daily.         Marland Kitchen glimepiride (AMARYL) 2 MG tablet   Oral   Take 1 tablet (2 mg total) by mouth 2 (two) times daily with a meal. Breakfast and dinner   60 tablet   3   . labetalol (NORMODYNE) 200 MG tablet   Oral  Take 1 tablet (200 mg total) by mouth 2 (two) times daily.   60 tablet   3   . levETIRAcetam (KEPPRA) 500 MG tablet   Oral   Take 1 tablet (500 mg total) by mouth 2 (two) times daily.   60 tablet   6   . Linagliptin-Metformin HCl (JENTADUETO) 2.05-998 MG TABS   Oral   Take 1 tablet by mouth daily.   30 tablet   3   . loratadine (CLARITIN) 10 MG tablet   Oral   Take 10 mg by mouth daily as needed for allergies.         . Multiple Vitamin (MULTIVITAMIN WITH MINERALS) TABS tablet   Oral   Take 1 tablet by mouth daily.         . pantoprazole (PROTONIX) 40 MG tablet   Oral   Take 1 tablet (40 mg total) by mouth daily.   30 tablet   3   . Rivaroxaban (XARELTO) 20 MG TABS tablet   Oral   Take 1 tablet (20 mg total) by mouth daily with supper. Start taking  20 mg once a day after 12/15.   30 tablet   5   . rosuvastatin (CRESTOR) 10 MG tablet   Oral   Take 10 mg by mouth daily.          BP 139/83  Pulse 77  Temp(Src) 97.6 F (36.4 C) (Oral)  Resp 18  Ht 5\' 9"  (1.753 m)  Wt 198 lb (89.812 kg)  BMI 29.23 kg/m2  SpO2 97%  Vital signs normal   Physical Exam  Nursing note and vitals reviewed. Constitutional: He is oriented to person, place, and time. He appears well-developed and well-nourished.  Non-toxic appearance. He does not appear ill. No distress.  HENT:  Head: Normocephalic and atraumatic.  Right Ear: External ear normal.  Left Ear: External ear normal.  Nose: Nose normal. No mucosal edema or rhinorrhea.  Mouth/Throat: Oropharynx is clear and moist and mucous membranes are normal. No dental abscesses or uvula swelling.  Eyes: Conjunctivae and EOM are normal. Pupils are equal, round, and reactive to light.  Neck: Normal range of motion and full passive range of motion without pain. Neck supple.  Cardiovascular: Normal rate, regular rhythm and normal heart sounds.  Exam reveals no gallop and no friction rub.   No murmur heard. Pulmonary/Chest: Effort normal and breath sounds normal. No respiratory distress. He has no wheezes. He has no rhonchi. He has no rales. He exhibits no tenderness and no crepitus.  Abdominal: Soft. Normal appearance and bowel sounds are normal. He exhibits no distension. There is no tenderness. There is no rebound and no guarding.  Musculoskeletal: Normal range of motion. He exhibits no edema and no tenderness.  Moves all extremities well.   Neurological: He is alert and oriented to person, place, and time. He has normal strength. No cranial nerve deficit.  Face is symmetrical, grips are equal, there is no pronator drift, finger to nose is intact bilaterally, there is no motor weakness in his extremities.  Skin: Skin is warm, dry and intact. No rash noted. No erythema. No pallor.  Psychiatric: He has a  normal mood and affect. His speech is normal and behavior is normal. His mood appears not anxious.    ED Course  Procedures (including critical care time)   Dr Posey Pronto, neurology, note reviewed from yesterday.   11:49 Dr Aram Beecham, neurology will come evaluate patient.   12:50 Patient has been seen  by Dr Cyril Mourning, they will follow patient and have ordered a MRI of the brain to be done today. They are considering stopping all anticoagulants and placing a vena caval filter.   13:35 Dr Clare Gandy, [facility], admit to med-surg, attending Dr Idelle Crouch Review Results for orders placed during the hospital encounter of 02/04/13  CBC WITH DIFFERENTIAL      Result Value Range   WBC 8.2  4.0 - 10.5 K/uL   RBC 4.55  4.22 - 5.81 MIL/uL   Hemoglobin 13.5  13.0 - 17.0 g/dL   HCT 24.5  80.9 - 98.3 %   MCV 85.9  78.0 - 100.0 fL   MCH 29.7  26.0 - 34.0 pg   MCHC 34.5  30.0 - 36.0 g/dL   RDW 38.2  50.5 - 39.7 %   Platelets 213  150 - 400 K/uL   Neutrophils Relative % 50  43 - 77 %   Neutro Abs 4.1  1.7 - 7.7 K/uL   Lymphocytes Relative 39  12 - 46 %   Lymphs Abs 3.2  0.7 - 4.0 K/uL   Monocytes Relative 9  3 - 12 %   Monocytes Absolute 0.8  0.1 - 1.0 K/uL   Eosinophils Relative 2  0 - 5 %   Eosinophils Absolute 0.1  0.0 - 0.7 K/uL   Basophils Relative 1  0 - 1 %   Basophils Absolute 0.0  0.0 - 0.1 K/uL  BASIC METABOLIC PANEL      Result Value Range   Sodium 140  137 - 147 mEq/L   Potassium 4.5  3.7 - 5.3 mEq/L   Chloride 101  96 - 112 mEq/L   CO2 24  19 - 32 mEq/L   Glucose, Bld 75  70 - 99 mg/dL   BUN 9  6 - 23 mg/dL   Creatinine, Ser 6.73  0.50 - 1.35 mg/dL   Calcium 41.9  8.4 - 37.9 mg/dL   GFR calc non Af Amer 78 (*) >90 mL/min   GFR calc Af Amer >90  >90 mL/min  APTT      Result Value Range   aPTT 37  24 - 37 seconds  PROTIME-INR      Result Value Range   Prothrombin Time 15.2  11.6 - 15.2 seconds   INR 1.23  0.00 - 1.49    Laboratory interpretation all normal   Mr Laqueta Jean Wo Contrast  01/29/2013   CLINICAL DATA:  History of PRES, seizures, hyperlipidemia and dementia.  EXAM: MRI HEAD WITHOUT AND WITH CONTRAST  TECHNIQUE: Multiplanar, multiecho pulse sequences of the brain and surrounding structures were obtained without and with intravenous contrast.  CONTRAST:  32mL MULTIHANCE GADOBENATE DIMEGLUMINE 529 MG/ML IV SOLN  COMPARISON:  MRI of the brain October 09, 2012  FINDINGS: New 15 x 14 mm focus of low T2, bright T1 signal with susceptibility artifact within the left posterior temporal lobe, most consistent with subacute hematoma with surrounding FLAIR hyperintense vasogenic edema. He patchy cortical of the subcortical white matter FLAIR T2 hyperintensities throughout the supratentorial brain progressed, with new mesial temporal lobe focus, new right frontal lobe and right para cingulate, left precentral involvement. Right occipital hemorrhagic encephalomalacia again noted with hemorrhage. Similar left common new right punctate foci of susceptibility artifact, with more apparent appearance of bifrontal and right temporal punctate micro hemorrhages.  The ventricles and sulci are normal for patient's age. No midline shift. Faint enhancement within the left frontal lobe  associated with the FLAIR abnormality. ; faint peripheral enhancement about the left temporal hematoma. No reduced diffusion to suggest acute ischemia.  No abnormal extra-axial fluid collections, leptomeningeal enhancement nor extra-axial masses. Normal major intracranial vascular flow voids observed at the skull base. The paranasal sinus are well aerated. Trace fluid signal within the left mastoid tip. Ocular globes and orbital contents are nonsuspicious though not tailored for evaluation. Craniocervical junction maintained. No abnormal sellar expansion.  IMPRESSION: Progressed peripheral supratentorial gray-white matter FLAIR abnormality's, including left frontal lesion which shows faint enhancement. In  addition, new left posterior temporal 15 x 14 mm he subacute hematoma. Additional supratentorial micro hemorrhages. Constellation of findings may reflect chronic severe hypertension, with subsequent lobar hematoma. Scattered micro hemorrhages can also be seen with amyloid angiopathy. The FLAIR abnormality's may reflect superimposed PRES. Overall progression of disease process.  No atrophy.   Electronically Signed   By: Elon Alas   On: 01/29/2013 19:31   Mr Brain Wo Contrast  02/04/2013   CLINICAL DATA:  57 year old male with recent intracranial hemorrhage, suspected posterior reversible encephalopathy syndrome. History of hypertension and diabetes. History of DVT and PE, being switched from xarelto to Coumadin. Originally presented with seizure in August.  EXAM: MRI HEAD WITHOUT CONTRAST  TECHNIQUE: Multiplanar, multiecho pulse sequences of the brain and surrounding structures were obtained without intravenous contrast.  COMPARISON:  01/29/2013 and earlier.  FINDINGS: 2-3 mm focus of restricted diffusion suggested in the anterior right frontal lobe subcortical white matter, but this is in the vicinity of an area of chronic blood products (series 8, image 101) and may simply represent blood related diffusion susceptibility artifact. Similar artifact is noted with the parenchymal left temporal lobe hemorrhage. That hemorrhage appears stable in size and configuration.  Numerous chronic micro hemorrhages throughout the brain (series 8, 801), including a previous macroscopic hemorrhage in the right occipital lobe probably similar to that now seen in the left temporal lobe.  No other areas of restricted diffusion. Major intracranial vascular flow voids are stable.  Gray and white matter T2 and FLAIR hyperintensity scattered in both cerebral hemispheres has not significantly changed allowing for differences in technique (3 tesla imaging today). Stable mild edema without significant mass effect in the left  temporal lobe. No new areas of T2 or FLAIR signal abnormality.  Stable and negative pituitary, cervicomedullary junction and visualized cervical spine. Normal bone marrow signal. Visible internal auditory structures appear normal. Visualized orbit soft tissues are within normal limits. Negative scalp soft tissues.  IMPRESSION: 1. Possible punctate area of restricted diffusion in the anterior right frontal lobe subcortical white matter such as due to acute lacunar infarct. However, this might also be imaging artifact related to nearby chronic blood products. 2. No acute mass effect or new intracranial hemorrhage. Stable left temporal lobe hemorrhage. Advanced chronic underlying mostly micro hemorrhages in the brain elsewhere. 3. 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 4. Top differential considerations to consider in addition to PRES include an underlying coagulopathy (such as protein C deficiency or other hypercoagulable state) and chronic vasculitis such as due to connective tissue disorder.   Electronically Signed   By: Lars Pinks M.D.   On: 02/04/2013 15:23    Imaging Review No results found.  EKG Interpretation   None       MDM   1. Intracranial hematoma, initial encounter     Plan admission  Rolland Porter, MD, FACEP    Janice Norrie, MD 02/04/13 [place], MD 02/04/13 1537

## 2013-02-04 NOTE — ED Notes (Signed)
Pt in MRI.

## 2013-02-04 NOTE — H&P (Signed)
[facility] Admission History and Physical  Service Pager: [phone]   Patient name: Juan Glenn    Medical record number: [id number]  Date of birth: Jul 28, 1955   Age: 58 y.o.     Gender: male   Primary Care Provider:  RECORD,CHARLES   Outpt Neurologist Alda Berthold [phone] Consultants: Neurology Code Status: Full Code  Chief Complaint: New lobar hematoma detected on outpatient Neurology MRI, no complaints  Assessment and Plan:  Juan Glenn is a 58 y.o. male presenting with concerns for a new L lobar hematoma detected on outpatient MRI from his neurology office. He had two recent hospitalizations in November 2014 for seizures, aspiration pneumonia, and DVT with pulmonary emboli. PMH is significant for diabetes, hypertension with recent hypertensive emergency, hyperlipidemia, DVT and PE,     #PRES (posterior reversible encephalopathy syndrome) - This is our presumed diagnosis following patient's extreme hypertension during recent hospitalization and consistent MRI findings. MRI 1/6 continues to show changes consistent with PRES however radiologist suggests exploring etiologies for hypercoagulable state such as Protein C deficiency or chronic vasculitis causes such as a connective tissue disorder. Due to patient's current anticoagulated state and potential IVC placement, we will defer these tests to outpatient follow up. Their results would not likely change our management at this time.  - pending neuro recommendations, do not expect need for surgical evacuation of hematoma - Continue BP control  #PE/DVT - Patient has been compliant with Xarelto medication since hospitalization.  - Stop Xarelto and hold all anticoagulation - SCD's - Hx of A Fib with RVR at onset of hospitalizations in Nov 2014; needs to be considered regarding anticoagulation but was likely secondary to acute illness  #Seizures - Patient does not seem to be having any  breakthrough seizures currently or recently. - continue Keppra 500mg  bid  #HTN - Patient reports normal values with maximum being 130's/80's at home - continue labetolol 200mg  bid - Norvasc 10mg   #Diabetes - Patient has had good control of his sugars at home with 80's-90's routinely - discontinue home oral medications - SSI while inpatient - atorvastatin 20mg  daily  #Hyperlipidemia -  - Continue home dose of Crestor   FEN/GI: Heart healthy diet Prophylaxis: SCD's Disposition: Pending neuro recommendations and possible IVC placement History of Present Illness: Juan Glenn is a 58 y.o. male presenting with concerning MRI finding of new L lobar hematoma from his outpatient neurology office.   Admission 11/6-11/18: Patient presented to ED with new seizure activity presumably due to hypertensive emergency. Patient was not previously treating his hypertension. He required intubation and developed an aspiration pneumonia. Noted to have new onset atrial fibrillation with RVR in the ED. Aspiration pna was treated with Levaquin and BP was controlled with labetalol 200mg  bid and norvasc 10mg . Keppra 500mg  twice daily added for seizure control.   Admission 11/19-12/3: Pt presented to ED with respiratory failure and subsequently found to have bilateral multifocal pulmonary edema as well as right-sided DVT. He required intubation and was started on Xeralto for PE/DVT treatment and prophylaxis.   Office Visit 12/23: Patient presented to outpatient neuro for follow-up regarding seizure activity. He had no breakthrough seizures on his Keppra. MRI scheduled for 12/31 which showed new L lobar hematoma. Patient was unable to be reached for several days via telephone. Once contacted he was instructed to go to the ED for admission and management by inpatient neuro per his outpatient neurologist, Dr. Posey Pronto, in collaboration with his PCP, Dr. Record. They anticipated changing  patient's anticoagulation or  pursuing an IVC filter.     Review Of Systems: Per HPI with the following additions: No chest pain, no vision changes, no abdominal pain, no numbness or tingling, no weakness or sensation deficits.   Otherwise 12 point review of systems was performed and was unremarkable.  Patient Active Problem List    Diagnosis  Date Noted   .  Intracranial hematoma  02/04/2013   .  CKD (chronic kidney disease) stage 3, GFR 30-59 ml/min  12/24/2012   .  Aspiration pneumonia  12/24/2012   .  Pulmonary embolism  12/19/2012   .  Acute respiratory failure  12/05/2012   .  ARDS (adult respiratory distress syndrome)  12/05/2012   .  HTN (hypertension), malignant  09/06/2012   .  DM (diabetes mellitus), type 2 with renal complications  07/30/1599   .  Convulsions/seizures  09/06/2012   .  Abnormal CT of brain  09/06/2012   .  Syncope  09/06/2012    Past Medical History:  Past Medical History   Diagnosis  Date   .  Diabetes mellitus without complication  0932   .  Renal disorder    .  Hyperlipidemia    .  Difficult intubation    .  Pulmonary embolism    .  PRES (posterior reversible encephalopathy syndrome)  09/2012    Past Surgical History:  Past Surgical History   Procedure  Laterality  Date   .  No surgical history   09/2012    Social History:  History   Substance Use Topics   .  Smoking status:  Never Smoker   .  Smokeless tobacco:  Never Used   .  Alcohol Use:  No    Additional social history: none  Please also refer to relevant sections of EMR.  Family History:  Family History   Problem  Relation  Age of Onset   .  Diabetes  Mother    .  Congestive Heart Failure  Mother      Died, 53s   .  Heart attack  Father      Died, 77   .  Diabetes  Brother    .  Hypertension  Sister    .  Cancer  Brother      Died, 58    Allergies and Medications:  Allergies   Allergen  Reactions   .  Lisinopril      MD thinks that this medication caused patient to "pass out" "faint".    No current  facility-administered medications on file prior to encounter.    Current Outpatient Prescriptions on File Prior to Encounter   Medication  Sig  Dispense  Refill   .  amLODipine (NORVASC) 10 MG tablet  Take 1 tablet (10 mg total) by mouth daily.  30 tablet    .  aspirin EC 81 MG tablet  Take 1 tablet (81 mg total) by mouth daily.     Marland Kitchen  glimepiride (AMARYL) 2 MG tablet  Take 1 tablet (2 mg total) by mouth 2 (two) times daily with a meal. Breakfast and dinner  60 tablet  3   .  labetalol (NORMODYNE) 200 MG tablet  Take 1 tablet (200 mg total) by mouth 2 (two) times daily.  60 tablet  3   .  levETIRAcetam (KEPPRA) 500 MG tablet  Take 1 tablet (500 mg total) by mouth 2 (two) times daily.  60 tablet  6   .  Linagliptin-Metformin HCl (JENTADUETO) 2.05-998 MG TABS  Take 1 tablet by mouth daily.  30 tablet  3   .  Multiple Vitamin (MULTIVITAMIN WITH MINERALS) TABS tablet  Take 1 tablet by mouth daily.     .  pantoprazole (PROTONIX) 40 MG tablet  Take 1 tablet (40 mg total) by mouth daily.  30 tablet  3   .  Rivaroxaban (XARELTO) 20 MG TABS tablet  Take 1 tablet (20 mg total) by mouth daily with supper. Start taking 20 mg once a day after 12/15.  30 tablet  5   .  rosuvastatin (CRESTOR) 10 MG tablet  Take 10 mg by mouth daily.      Objective:  BP 140/83  Pulse 80  Temp(Src) 97.6 F (36.4 C) (Oral)  Resp 17  Ht 5\' 9"  (1.753 m)  Wt 198 lb (89.812 kg)  BMI 29.23 kg/m2  SpO2 97%  Exam:  General: NAD, laying in bed.  HEENT: atraumatic, PERRL, no LAD,  TM's clear Cardiovascular: RRR, No MRG, pulses 2+ bilaterally and in all extremities Respiratory: Non-labored respirations, CTAB Abdomen: Soft, Non-tender, Non-distended Extremities: Warm, non-edematous  Skin: intact, no rashes, dry, non-erythematous Neuro: CN II-XII intact, reflexes not tested, Strength symmetric 5/5 in all extremities, Sensation intact, Cerebellar function not tested  Labs and Imaging:  CBC  BMET    Recent Labs    Recent Labs       Lab  02/04/13 1136      NA  140      K  4.5    Lab  02/04/13 1136   CL  101    WBC  8.2   CO2  24    HGB  13.5   BUN  9    HCT  39.1   CREATININE  1.03    PLT  213   GLUCOSE  75      CALCIUM  10.0     EXAM:  MRI HEAD WITHOUT CONTRAST 02/04/13 TECHNIQUE:  Multiplanar, multiecho pulse sequences of the brain and surrounding  structures were obtained without intravenous contrast.  COMPARISON: 01/29/2013 and earlier.  FINDINGS:  2-3 mm focus of restricted diffusion suggested in the anterior right  frontal lobe subcortical white matter, but this is in the vicinity  of an area of chronic blood products (series 8, image 101) and may  simply represent blood related diffusion susceptibility artifact.  Similar artifact is noted with the parenchymal left temporal lobe  hemorrhage. That hemorrhage appears stable in size and  configuration.  Numerous chronic micro hemorrhages throughout the brain (series 8,  801), including a previous macroscopic hemorrhage in the right  occipital lobe probably similar to that now seen in the left  temporal lobe.  No other areas of restricted diffusion. Major intracranial vascular  flow voids are stable.  Gray and white matter T2 and FLAIR hyperintensity scattered in both  cerebral hemispheres has not significantly changed allowing for  differences in technique (3 tesla imaging today). Stable mild edema  without significant mass effect in the left temporal lobe. No new  areas of T2 or FLAIR signal abnormality.  Stable and negative pituitary, cervicomedullary junction and  visualized cervical spine. Normal bone marrow signal. Visible  internal auditory structures appear normal. Visualized orbit soft  tissues are within normal limits. Negative scalp soft tissues.  IMPRESSION:  1. Possible punctate area of restricted diffusion in the anterior  right frontal lobe subcortical white matter such as due to acute  lacunar infarct. However, this  might also be  imaging artifact  related to nearby chronic blood products.  2. No acute mass effect or new intracranial hemorrhage. Stable left  temporal lobe hemorrhage. Advanced chronic underlying mostly micro  hemorrhages in the brain elsewhere.  3. 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  4. Top differential considerations to consider in addition to PRES  include an underlying coagulopathy (such as protein C deficiency or  other hypercoagulable state) and chronic vasculitis such as due to  connective tissue disorder.  Electronically Signed  By: Lars Pinks M.D.  On: 02/04/2013 15:23   Admission discussed and H&P formulated by Dr. Raeford Razor and MS Charline Bills.  I have reviewed and made appropriate changes as above.  Gerda Diss, DO [facility] Resident - PGY-3 02/04/2013 8:04 PM

## 2013-02-04 NOTE — ED Notes (Signed)
Pt states that he is currently on Xarelto and sent here to change to coumadin after having MRI that showed he had a small area of blood on his brain.  No other symptoms

## 2013-02-04 NOTE — ED Notes (Signed)
Spoke with MRI, pt wont be ready for MRI for at least two hours.

## 2013-02-05 DIAGNOSIS — I2699 Other pulmonary embolism without acute cor pulmonale: Secondary | ICD-10-CM

## 2013-02-05 LAB — GLUCOSE, CAPILLARY
GLUCOSE-CAPILLARY: 152 mg/dL — AB (ref 70–99)
Glucose-Capillary: 85 mg/dL (ref 70–99)
Glucose-Capillary: 115 mg/dL — ABNORMAL HIGH (ref 70–99)
Glucose-Capillary: 98 mg/dL (ref 70–99)

## 2013-02-05 LAB — HIV ANTIBODY (ROUTINE TESTING W REFLEX): HIV: NONREACTIVE

## 2013-02-05 LAB — RPR: RPR Ser Ql: NONREACTIVE

## 2013-02-05 LAB — PROTIME-INR
INR: 1.06 (ref 0.00–1.49)
PROTHROMBIN TIME: 13.6 s (ref 11.6–15.2)

## 2013-02-05 LAB — ANGIOTENSIN CONVERTING ENZYME: Angiotensin-Converting Enzyme: 19 U/L (ref 8–52)

## 2013-02-05 LAB — SEDIMENTATION RATE: Sed Rate: 15 mm/hr (ref 0–16)

## 2013-02-05 LAB — ANTITHROMBIN III: ANTITHROMB III FUNC: 95 % (ref 75–120)

## 2013-02-05 NOTE — Progress Notes (Signed)
   CARE MANAGEMENT NOTE 02/05/2013  Patient:  Juan Glenn, Juan Glenn   Account Number:  [id number]  Date Initiated:  02/05/2013  Documentation initiated by:  Olga Coaster  Subjective/Objective Assessment:   ADMITTED WITH INTRACRANIAL HEMATOMA     Action/Plan:   CM FOLLOWING FOR DCP   Anticipated DC Date:  02/10/2013   Anticipated DC Plan: [place]  CM consult         Status of service:  In process, will continue to follow Medicare Important Message given?  NA - LOS <3 / Initial given by admissions (If response is "NO", the following Medicare IM given date fields will be blank)  Per UR Regulation:  Reviewed for med. necessity/level of care/duration of stay  Comments:  1/7/2015AZAM GERVASI RN,BSN,MHA [phone]

## 2013-02-05 NOTE — Progress Notes (Signed)
Stroke Team Progress Note  HISTORY Juan Glenn is an 58 y.o. male who was admitted 11/19-12/03/2012: Patient presented to emergency room with respiratory failure and found to have bilateral multifocal pulmonary emboli and right sided DVT. He was intubated and on 11/19 and extubated 911/23. His hospital course was notable for pulmonary edema, hypernatremia, and respiratory insufficiency. He was started on Xeralto for his PE and DVT. Workup while in hospital included EEG, MRI brain wwo contrast, CTA, echo, and laboratory resting. EEG was normal and MRI brain which showed abnormal patchy areas of T2 signal intensity involving bilateral temporal lobes, occipital lobes, parietal lobes, and left frontal lobe. Some of these areas demonstrate mild enhancement. There is minimal petechial hemorrhage in the left frontal lobe and right parietal lobe. His CTA showed mild intracranial atherosclerosis. No evidence for embolic source on echocardiogram. Per neurology note, " Imaging was performed on 01/29/2013 that showed several areas of white matter changes which had progressed since his previous MRI of brain in September 2014. In addition, there is a new left lobar subacute hematoma, which was not previously seen on his CT head dated 11/6. Imaging was reviewed personally with radiology and changes are thought to be most consistent with PRES. Subacute hematoma may have bee due elevated blood pressure and that he is on anticoagulation for PE/DVT. " Dr Posey Pronto (neurology ) discussed the above findings with the patient and his primary care physician, Dr. Record, because I do not feel that he is safe taking Xeralto with known subacute intracranial hematoma since it cannot be reserved. Dr. Record would be managing his anticoagulation and would like the patient admitted for close observation as he is transitioned off Xeralto and to coumadin (keeping a narrow INR goal), which I think is reasonable. Patient was brought to ED and  neurology was asked to see patient, follow patient, and give further recommendations while in hospital.   Ischemic stroke was an incidental finding. Patient was not considered for TPA secondary to asymptomatic findings, currently on xarelto. He was admitted for further evaluation and treatment.  SUBJECTIVE His wife is at the bedside.  Overall he feels his condition is stable. He has no deficits from current stroke.  OBJECTIVE Most recent Vital Signs: Filed Vitals:   02/04/13 1549 02/05/13 0107 02/05/13 0513 02/05/13 1041  BP:  118/73 131/77 118/75  Pulse:  81 108 83  Temp:  97.8 F (36.6 C) 97.9 F (36.6 C) 98.1 F (36.7 C)  TempSrc:  Oral Oral Oral  Resp:  '20 20 18  ' Height: '5\' 9"'  (1.753 m)     Weight: 80 kg (176 lb 5.9 oz)     SpO2:  97% 98% 99%   CBG (last 3)   Recent Labs  02/04/13 2320 02/05/13 0653 02/05/13 1127  GLUCAP 104* 85 152*    IV Fluid Intake:   . sodium chloride 20 mL/hr (02/04/13 1846)    MEDICATIONS  . amLODipine  10 mg Oral Daily  . atorvastatin  20 mg Oral q1800  . insulin aspart  0-15 Units Subcutaneous TID WC  . labetalol  200 mg Oral BID  . levETIRAcetam  500 mg Oral BID  . pantoprazole  40 mg Oral Daily   PRN:  loratadine  Diet:  Cardiac thin liquids Activity:  As tolerated DVT Prophylaxis:  SCDs   CLINICALLY SIGNIFICANT STUDIES Basic Metabolic Panel:  Recent Labs Lab 02/04/13 1136  NA 140  K 4.5  CL 101  CO2 24  GLUCOSE 75  BUN 9  CREATININE 1.03  CALCIUM 10.0   Liver Function Tests: No results found for this basename: AST, ALT, ALKPHOS, BILITOT, PROT, ALBUMIN,  in the last 168 hours CBC:  Recent Labs Lab 02/04/13 1136  WBC 8.2  NEUTROABS 4.1  HGB 13.5  HCT 39.1  MCV 85.9  PLT 213   Coagulation:  Recent Labs Lab 02/04/13 1136 02/05/13 0455  LABPROT 15.2 13.6  INR 1.23 1.06   Cardiac Enzymes: No results found for this basename: CKTOTAL, CKMB, CKMBINDEX, TROPONINI,  in the last 168 hours Urinalysis: No results  found for this basename: COLORURINE, APPERANCEUR, LABSPEC, PHURINE, GLUCOSEU, HGBUR, BILIRUBINUR, KETONESUR, PROTEINUR, UROBILINOGEN, NITRITE, LEUKOCYTESUR,  in the last 168 hours Lipid Panel    Component Value Date/Time   CHOL 110 09/07/2012 0540   TRIG 116 12/21/2012 0500   HDL 43 09/07/2012 0540   CHOLHDL 2.6 09/07/2012 0540   VLDL 22 09/07/2012 0540   LDLCALC 45 09/07/2012 0540   HgbA1C  Lab Results  Component Value Date   HGBA1C 8.0* 12/24/2012    Urine Drug Screen:     Component Value Date/Time   LABOPIA NONE DETECTED 12/05/2012 0041   COCAINSCRNUR NONE DETECTED 12/05/2012 0041   LABBENZ NONE DETECTED 12/05/2012 0041   AMPHETMU NONE DETECTED 12/05/2012 0041   THCU NONE DETECTED 12/05/2012 0041   LABBARB NONE DETECTED 12/05/2012 0041    Alcohol Level: No results found for this basename: ETH,  in the last 168 hours  CT of the brain    MRI of the brain  02/04/2013   1. Possible punctate area of restricted diffusion in the anterior right frontal lobe subcortical white matter such as due to acute lacunar infarct. However, this might also be imaging artifact related to nearby chronic blood products. 2. No acute mass effect or new intracranial hemorrhage. Stable left temporal lobe hemorrhage. Advanced chronic underlying mostly micro hemorrhages in the brain elsewhere. 3. 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 4. Top differential considerations to consider in addition to PRES include an underlying coagulopathy (such as protein C deficiency or other hypercoagulable state) and chronic vasculitis such as due to connective tissue disorder.     CT angio of the brain  09/07/2012 Mild intracranial atherosclerotic type changes  2D Echocardiogram  12/20/2012 EF 55-60% with no source of embolus.  Carotid Doppler    CXR  12/29/2012 1. Significant improvement in bilateral edema  or infiltrates with mild residual 2. Stable cardiomegaly.  EKG  12/19/2012 sinus tachycardia.   Therapy Recommendations no therapy needs  Physical Exam   Pleasant middle aged african [place] male not in any distress.Awake alert. Afebrile. Head is nontraumatic. Neck is supple without bruit. Hearing is normal. Cardiac exam no murmur or gallop. Lungs are clear to auscultation. Distal pulses are well felt. Neurological Exam ;  Awake  Alert oriented x 3. Normal speech and language.intact attention, registration and short-term memory. Recall 2/3. eye movements full without nystagmus.fundi were not visualized. Vision acuity and fields appear normal. Hearing is normal. Palatal movements are normal. Face symmetric. Tongue midline. Normal strength, tone, reflexes and coordination. Normal sensation. Gait deferred. ASSESSMENT Mr. Fields Oros is a 58 y.o. male who has a  complicated history of: Syncope, seizures, DVT, pulmonary embolism, silent left temporal intracerebral hematoma and now a tiny silent right brain subcortical infarct-all within the last 4-5 months  Aug 2014 - syncope,    . MRI showed  white matter hyperintensities with a broad differential listed Sept 2014 - Follow up MRI showed improvement in white matter disease - pt dx as PRES.  Nov 2014 - hospitalized with seizures due to hypertensive emergency, dx with new onset afib Nov - Dec 2014 respiratory failure, found to have PE and DVT, placed on xarelto.  Dec 2014 - f/u MRI showed progression or white matter disease with an asymptomatic right occipital and left temporal hemorrhage along with a few microhemorrhages.  Jan 2014 - MRI with increased microhemorrhages and an incidental small right frontal ischemic infarct.   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 Now on no antithrombotics due to intracerebral hemorrhages.    LDL 45 Diabetes, HgbA1c 8.0, goal < 7.0 (Nov 2014)  Hospital day # 1  TREATMENT/PLAN  Discontinue anticoagulation given presence of 2 silent brain hemorrhages9 right occipital and left temporal) with increasing microhemorrhages on GRE sequences. I had a long discussion with the patient and family members regarding risk of increasing brain hemorrhage and neurological damage in the setting of ongoing anticoagulation versus unknown risk of future recurrent DVT or pulmonary embolism. Dr. Posey Pronto had suggested low intensity anticoagulation with warfarin but I feel the risk of hemorrhage is significant even with that and  recommend against it  Place a [place] filter  check Hypercoagulable panel (minus factor 5 leiden and beta-2-glycoprotein as these test for venous clots) and vasculitic labs (C3, C4, CH50, ANA, ESR) and RPR, HIV, angiotensin converting enzyme  Likely will need a LP if above work up unyielding  Dr. Leonie Man discussed diagnosis, prognosis,  treatment options and plan of care with pt, wife and medical team.    Burnetta Sabin, MSN, RN, ANVP-BC, ANP-BC, GNP-BC [facility] Pager: [phone] 02/05/2013 11:33 AM  I have personally obtained a history, examined the patient, evaluated imaging results, and formulated the assessment and plan of care. I agree with the above. Antony Contras, MD

## 2013-02-05 NOTE — Progress Notes (Signed)
Patient name: Juan Glenn    Medical record number: [id number]  Date of birth: 1955/11/07    Age: 58 y.o.     Gender: male  Primary Care Provider: RECORD,CHARLES  Outpt Neurologist Alda Berthold [phone]  Consultants: Neurology Code Status: Full Code  Pt Overview and Major Events to Date:  1/6: Admitted to hospital through ED and neuro consulted. Anticoagulation stopped. Repeat MRI with concerning but not operable findings. Stable, comfortable.  1/7: Full workup initiated for coagulopathy, demyelinating disease, connective tissue disease including LP. Discussed IVC filter and made plans to pursue.  Assessment and Plan:  Juan Glenn is a 58 y.o. male presenting with concerns for a new L lobar hematoma detected on outpatient MRI from his neurology office. He had two recent hospitalizations in November 2014 for seizures, aspiration pneumonia, and DVT with pulmonary emboli. PMH is significant for diabetes, hypertension with recent hypertensive emergency, hyperlipidemia, seizures with first episode 08/2012, and DVT with multifocal PE's.   #Microhemorrhages, L temporal hematoma, and white matter changes - These changes were detected on MRI 02/04/13 and were compared to several prior MRI images by inpatient neuro today. They are concerned that this is not a presentation consistent with PRES as was previously discussed. Patient's MRI findings do not seem to be reversing despite optimal blood pressure control. Please see their note for a more complete discussion of these indications. - Laboratory workup for coagulopathy (APL antibodies, Protein C deficiency, etc.), demyelinating disease, and connective tissue disease - Continue to hold anticoagulation and pursue IVC filter  #PE/DVT -  - Stop Xarelto and hold all anticoagulation due to hematoma above - SCD's  - Hx of A Fib with RVR at onset of hospitalizations in Nov 2014; needs to be considered regarding anticoagulation but was likely  secondary to acute illness  - Patient's mother may have had devastating DVT/PE in the past, hypercoagulable workup underway  #Seizures -  - no recent seizure activity  - continue Keppra 500mg  bid   #HTN - Patient reports normal values with maximum being 130's/80's at home  - continue labetolol 200mg  bid  - Norvasc 10mg    #Diabetes - Patient has had good control of his sugars at home with 80's-90's routinely  - discontinue home oral medications  - SSI while inpatient  - atorvastatin 20mg  daily   #Hyperlipidemia -  - Continue home dose of Crestor   FEN/GI: Heart healthy diet  Prophylaxis: SCD's   Disposition: Pending neuro recommendations and IVC placement    Subjective: Patient was laying in bed comfortably with no vision or balance changes overnight. He slept well. Asking if he can ambulate today.  Objective:  Filed Vitals:   02/05/13 1514  BP: 125/81  Pulse: 79  Temp: 97.9 F (36.6 C)  Resp: 18   Exam:  General: NAD, laying in bed.  HEENT: atraumatic, PERRL, no LAD, TM's clear  Cardiovascular: RRR, No MRG, pulses 2+ bilaterally and in all extremities  Respiratory: Non-labored respirations, CTAB  Abdomen: Soft, Non-tender, Non-distended  Extremities: Warm, non-edematous  Skin: intact, no rashes, dry, non-erythematous  Neuro: CN II-XII intact, reflexes not tested, Strength symmetric 5/5 in all extremities, Sensation intact, Cerebellar function not tested  Labs and Imaging:  CBC  BMET   Recent Labs   Recent Labs      Lab  02/04/13 1136      NA  140      K  4.5    Lab  02/04/13 1136   CL  101  WBC  8.2   CO2  24    HGB  13.5   BUN  9    HCT  39.1   CREATININE  1.03    PLT  213   GLUCOSE  75      CALCIUM  10.0    EXAM:  MRI HEAD WITHOUT CONTRAST 02/04/13  TECHNIQUE:   IMPRESSION:  1. Possible punctate area of restricted diffusion in the anterior  right frontal lobe subcortical white matter such as due to acute  lacunar infarct. However, this might also be  imaging artifact  related to nearby chronic blood products.  2. No acute mass effect or new intracranial hemorrhage. Stable left  temporal lobe hemorrhage. Advanced chronic underlying mostly micro  hemorrhages in the brain elsewhere.  3. 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  4. Top differential considerations to consider in addition to PRES  include an underlying coagulopathy (such as protein C deficiency or  other hypercoagulable state) and chronic vasculitis such as due to  connective tissue disorder.  Electronically Signed  By: Lars Pinks M.D.  On: 02/04/2013 15:23  Charline Bills, MSIV 3:41 PM 02/05/2013   I have seen and examined the patient and I agree with the medical student's documentation above. My annotations are in blue. My exam from today is below.   Gen: NAD, alert, cooperative with exam, pleasant HEENT: NCAT, PERRL, mmm CV: RRR, good S1/S2, no murmur Resp: CTABL, no wheezes, non-labored Abd: SNTND, BS present, no guarding or organomegaly Ext: No edema, warm Neuro: Alert and oriented, strength 5/5 and sensation intact in all 4 extremities

## 2013-02-05 NOTE — Progress Notes (Signed)
FMTS Attending  Note: Andrue Dini,MD I  have seen and examined this patient, reviewed their chart. I have discussed this patient with the resident. I agree with the resident's findings, assessment and care plan.  

## 2013-02-05 NOTE — H&P (Signed)
FMTS Attending Admission Note: Juan Glenn I  have seen and examined this patient, reviewed their chart. I have discussed this patient with the resident. I agree with the resident's findings, assessment and care plan.  Briefly patient presented with intracranial hematoma on xarelto which he was started on for DVT from his last hospital admission. Currently patient denies any symptoms,no headache,N/V,no change in vision,no gait problems,denies bleeding from any other source.  Filed Vitals:   02/04/13 1549 02/04/13 1733 02/04/13 2025 02/05/13 0107  BP: 153/84 135/76 127/73 118/73  Pulse: 76 87 76 81  Temp: 97.7 F (36.5 C) 97.6 F (36.4 C) 97.3 F (36.3 C) 97.8 F (36.6 C)  TempSrc: Oral Oral Oral Oral  Resp: 18 19 18 20   Height: 5\' 9"  (1.753 m)     Weight: 176 lb 5.9 oz (80 kg)     SpO2: 95% 99% 98% 97%    Exam: Gen: Calm in bed not in distress. HEENT: EOMI,PERRLA. Neuro: Oriented x 3, ++DTR globally,power/motor strength across all joint is 5/5,no sensory deficit,no signs of meningeal irritation. Resp: Air entry equal b/l and clear. CV: S1 S2 normal, RRR,no murmurs. Abd: Benign. Ext: No edema.  A/P: 58 y/o M with 1. Intracranial hematoma: Likely secondary to use of anticoagulant with or without head trauma.     Although he denies head trauma, he could have had one during his seizure episodes.     Neuro consult recommended.     Hold anticoagulant for now.  2. PE/DVT: HOLD ANTICOAGULANT     According to neurologist and his PCP's documentation plan was to switch him to Coumadin from Xarelto.     As far as am concern Coumadin also has as much risk for bleeding as Xarelto especially in a patient with risk of fall during seizure activity.    I will recommend IVC placement rather than anticoagulant for now and ASA coverage.     I short acting anticoagulant that can be easily reversible like heparin might be beneficial too but preferably IVC.     I will defer this to  neurologist and his PCP at f/u.  3. Seizure/HTN/DM/HLD: COntinue home regimen.

## 2013-02-06 ENCOUNTER — Other Ambulatory Visit (HOSPITAL_COMMUNITY): Payer: BC Managed Care – PPO

## 2013-02-06 ENCOUNTER — Inpatient Hospital Stay (HOSPITAL_COMMUNITY): Payer: BC Managed Care – PPO

## 2013-02-06 LAB — LUPUS ANTICOAGULANT PANEL
DRVVT: 32.4 secs (ref <42.9)
Lupus Anticoagulant: NOT DETECTED
PTT Lupus Anticoagulant: 35.5 secs (ref 28.0–43.0)

## 2013-02-06 LAB — GLUCOSE, CAPILLARY
GLUCOSE-CAPILLARY: 69 mg/dL — AB (ref 70–99)
Glucose-Capillary: 127 mg/dL — ABNORMAL HIGH (ref 70–99)
Glucose-Capillary: 119 mg/dL — ABNORMAL HIGH (ref 70–99)
Glucose-Capillary: 144 mg/dL — ABNORMAL HIGH (ref 70–99)

## 2013-02-06 LAB — C4 COMPLEMENT: Complement C4, Body Fluid: 31 mg/dL (ref 10–40)

## 2013-02-06 LAB — CARDIOLIPIN ANTIBODIES, IGG, IGM, IGA
ANTICARDIOLIPIN IGG: 9 GPL U/mL — AB (ref <23)
Anticardiolipin IgA: 10 APL U/mL — ABNORMAL LOW (ref <22)
Anticardiolipin IgM: 3 MPL U/mL — ABNORMAL LOW (ref <11)

## 2013-02-06 LAB — PROTEIN S ACTIVITY: PROTEIN S ACTIVITY: 101 % (ref 69–129)

## 2013-02-06 LAB — BETA-2-GLYCOPROTEIN I ABS, IGG/M/A
BETA-2-GLYCOPROTEIN I IGA: 4 A Units (ref <20)
BETA-2-GLYCOPROTEIN I IGM: 18 M Units (ref <20)
Beta-2 Glyco I IgG: 7 G Units (ref <20)

## 2013-02-06 LAB — PROTEIN C ACTIVITY: Protein C Activity: 198 % — ABNORMAL HIGH (ref 75–133)

## 2013-02-06 LAB — HOMOCYSTEINE: Homocysteine: 10.5 umol/L (ref 4.0–15.4)

## 2013-02-06 LAB — ANA: Anti Nuclear Antibody(ANA): NEGATIVE

## 2013-02-06 LAB — C3 COMPLEMENT: C3 COMPLEMENT: 128 mg/dL (ref 90–180)

## 2013-02-06 MED ORDER — MIDAZOLAM HCL 2 MG/2ML IJ SOLN
INTRAMUSCULAR | Status: AC | PRN
  Administered 2013-02-06: 2 mg via INTRAVENOUS

## 2013-02-06 MED ORDER — MIDAZOLAM HCL 2 MG/2ML IJ SOLN
INTRAMUSCULAR | Status: AC
  Administered 2013-02-06: 17:00:00
  Filled 2013-02-06: qty 2

## 2013-02-06 MED ORDER — IOHEXOL 300 MG/ML  SOLN
150.0000 mL | Freq: Once | INTRAMUSCULAR | Status: AC | PRN
  Administered 2013-02-06: 55 mL via INTRAVENOUS

## 2013-02-06 MED ORDER — FENTANYL CITRATE 0.05 MG/ML IJ SOLN
INTRAMUSCULAR | Status: AC | PRN
  Administered 2013-02-06: 50 ug via INTRAVENOUS
  Administered 2013-02-06: 25 ug via INTRAVENOUS

## 2013-02-06 MED ORDER — FENTANYL CITRATE 0.05 MG/ML IJ SOLN
INTRAMUSCULAR | Status: AC
  Filled 2013-02-06: qty 2

## 2013-02-06 NOTE — H&P (Signed)
Juan Glenn is an 58 y.o. male.   Chief Complaint: pt has had new R brain subcortical infarct and new L intracranial hematoma on MRI 01/29/2013 performed in follow up of seizures. 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 Diabetes mellitus without complication 8546  . Renal disorder   . Hyperlipidemia   . Difficult intubation   . Pulmonary embolism   . PRES (posterior reversible encephalopathy syndrome) 09/2012    Past Surgical History  Procedure Laterality Date  . No surgical history  09/2012    Family History  Problem Relation Age of Onset  . Diabetes Mother   . Congestive Heart Failure Mother     Died, 64s  . Heart attack Father     Died, 59  . Diabetes Brother   . Hypertension Sister   . Cancer Brother     Died, 1   Social History:  reports that he has never smoked. He has never used smokeless tobacco. He reports that he does not drink alcohol or use illicit drugs.  Allergies:  Allergies  Allergen Reactions  . Lisinopril     MD thinks that this medication caused patient to "pass out" "faint".     Medications Prior to Admission  Medication Sig Dispense Refill  . amLODipine (NORVASC) 10 MG tablet Take 1 tablet (10 mg total) by mouth daily.  30 tablet    . aspirin EC 81 MG tablet Take 1 tablet (81 mg total) by mouth daily.      Marland Kitchen glimepiride (AMARYL) 2 MG tablet Take 1 tablet (2 mg total) by mouth 2 (two) times daily with a meal. Breakfast and dinner  60 tablet  3  . labetalol (NORMODYNE) 200 MG tablet Take 1 tablet (200 mg total) by mouth 2 (two) times daily.  60 tablet  3  . levETIRAcetam (KEPPRA) 500  MG tablet Take 1 tablet (500 mg total) by mouth 2 (two) times daily.  60 tablet  6  . Linagliptin-Metformin HCl (JENTADUETO) 2.05-998 MG TABS Take 1 tablet by mouth daily.  30 tablet  3  . loratadine (CLARITIN) 10 MG tablet Take 10 mg by mouth daily as needed for allergies.      . Multiple Vitamin (MULTIVITAMIN WITH MINERALS) TABS tablet Take 1 tablet by mouth daily.      . pantoprazole (PROTONIX) 40 MG tablet Take 1 tablet (40 mg total) by mouth daily.  30 tablet  3  . Rivaroxaban (XARELTO) 20 MG TABS tablet Take 1 tablet (20 mg total) by mouth daily with supper. Start taking 20 mg once a day after 12/15.  30 tablet  5  . rosuvastatin (CRESTOR) 10 MG tablet Take 10 mg by mouth daily.        Results for orders placed during the hospital encounter of 02/04/13 (from the past 48 hour(s))  GLUCOSE, CAPILLARY     Status: None   Collection Time    02/04/13  4:43 PM      Result Value Range   Glucose-Capillary 71  70 - 99 mg/dL  GLUCOSE, CAPILLARY     Status: Abnormal   Collection Time    02/04/13 11:20 PM  Result Value Range   Glucose-Capillary 104 (*) 70 - 99 mg/dL   Comment 1 Notify RN     Comment 2 Documented in Chart    PROTIME-INR     Status: None   Collection Time    02/05/13  4:55 AM      Result Value Range   Prothrombin Time 13.6  11.6 - 15.2 seconds   INR 1.06  0.00 - 1.49  GLUCOSE, CAPILLARY     Status: None   Collection Time    02/05/13  6:53 AM      Result Value Range   Glucose-Capillary 85  70 - 99 mg/dL   Comment 1 Notify RN     Comment 2 Documented in Chart    GLUCOSE, CAPILLARY     Status: Abnormal   Collection Time    02/05/13 11:27 AM      Result Value Range   Glucose-Capillary 152 (*) 70 - 99 mg/dL   Comment 1 Documented in Chart    SEDIMENTATION RATE     Status: None   Collection Time    02/05/13  1:26 PM      Result Value Range   Sed Rate 15  0 - 16 mm/hr  RPR     Status: None   Collection Time    02/05/13  1:26 PM      Result Value Range   RPR NON  REACTIVE  NON REACTIVE   Comment: Performed at [facility]  ANA     Status: None   Collection Time    02/05/13  1:26 PM      Result Value Range   ANA NEGATIVE  NEGATIVE   Comment: Performed at [place]     Status: None   Collection Time    02/05/13  1:26 PM      Result Value Range   Complement C4, Body Fluid 31  10 - 40 mg/dL   Comment: Performed at [place]     Status: None   Collection Time    02/05/13  1:26 PM      Result Value Range   C3 Complement 128  90 - 180 mg/dL   Comment: Performed at [facility]     Status: None   Collection Time    02/05/13  1:26 PM      Result Value Range   AntiThromb III Func 95  75 - 120 %  HOMOCYSTEINE     Status: None   Collection Time    02/05/13  1:26 PM      Result Value Range   Homocysteine 10.5  4.0 - 15.4 umol/L   Comment: Performed at [place], IGG, IGM, IGA     Status: Abnormal   Collection Time    02/05/13  1:26 PM      Result Value Range   Anticardiolipin IgG 9 (*) <23 GPL U/mL   Anticardiolipin IgM 3 (*) <11 MPL U/mL   Anticardiolipin IgA 10 (*) <22 APL U/mL   Comment: (NOTE)     Reference Range:  Cardiolipin IgG       Normal                  <23       Low Positive (+)        23-35       Moderate Positive (+)   36-50  High Positive (+)       >50     Reference Range:  Cardiolipin IgM       Normal                  <11       Low Positive (+)        11-20       Moderate Positive (+)   21-30       High Positive (+)       >30     Reference Range:  Cardiolipin IgA       Normal                  <22       Low Positive (+)        22-35       Moderate Positive (+)   36-45       High Positive (+)       >45     Performed at [facility]  HIV ANTIBODY (ROUTINE TESTING)     Status: None   Collection Time    02/05/13  1:26 PM      Result Value Range   HIV NON REACTIVE  NON REACTIVE    Comment: Performed at [facility]  BETA-2-GLYCOPROTEIN I ABS, IGG/M/A     Status: None   Collection Time    02/05/13  1:26 PM      Result Value Range   Beta-2 Glyco I IgG 7  <20 G Units   Beta-2-Glycoprotein I IgM 18  <20 M Units   Beta-2-Glycoprotein I IgA 4  <20 A Units   Comment: Performed at [place]     Status: None   Collection Time    02/05/13  1:26 PM      Result Value Range   Angiotensin-Converting Enzyme 19  8 - 52 U/L   Comment: Performed at [place], CAPILLARY     Status: Abnormal   Collection Time    02/05/13  4:47 PM      Result Value Range   Glucose-Capillary 115 (*) 70 - 99 mg/dL   Comment 1 Documented in Chart    GLUCOSE, CAPILLARY     Status: None   Collection Time    02/05/13  9:30 PM      Result Value Range   Glucose-Capillary 98  70 - 99 mg/dL  GLUCOSE, CAPILLARY     Status: Abnormal   Collection Time    02/06/13  7:52 AM      Result Value Range   Glucose-Capillary 127 (*) 70 - 99 mg/dL  GLUCOSE, CAPILLARY     Status: Abnormal   Collection Time    02/06/13 11:52 AM      Result Value Range   Glucose-Capillary 119 (*) 70 - 99 mg/dL   Mr Brain Wo Contrast  02/04/2013   CLINICAL DATA:  58 year old male with recent intracranial hemorrhage, suspected posterior reversible encephalopathy syndrome. History of hypertension and diabetes. History of DVT and PE, being switched from xarelto to Coumadin. Originally presented with seizure in August.  EXAM: MRI HEAD WITHOUT CONTRAST  TECHNIQUE: Multiplanar, multiecho pulse sequences of the brain and surrounding structures were obtained without intravenous contrast.  COMPARISON:  01/29/2013 and earlier.  FINDINGS: 2-3 mm focus of restricted diffusion suggested in the anterior right frontal lobe subcortical white matter, but this is in the vicinity of an area of chronic  blood products (series 8, image 101) and may simply represent blood related diffusion  susceptibility artifact. Similar artifact is noted with the parenchymal left temporal lobe hemorrhage. That hemorrhage appears stable in size and configuration.  Numerous chronic micro hemorrhages throughout the brain (series 8, 801), including a previous macroscopic hemorrhage in the right occipital lobe probably similar to that now seen in the left temporal lobe.  No other areas of restricted diffusion. Major intracranial vascular flow voids are stable.  Gray and white matter T2 and FLAIR hyperintensity scattered in both cerebral hemispheres has not significantly changed allowing for differences in technique (3 tesla imaging today). Stable mild edema without significant mass effect in the left temporal lobe. No new areas of T2 or FLAIR signal abnormality.  Stable and negative pituitary, cervicomedullary junction and visualized cervical spine. Normal bone marrow signal. Visible internal auditory structures appear normal. Visualized orbit soft tissues are within normal limits. Negative scalp soft tissues.  IMPRESSION: 1. Possible punctate area of restricted diffusion in the anterior right frontal lobe subcortical white matter such as due to acute lacunar infarct. However, this might also be imaging artifact related to nearby chronic blood products. 2. No acute mass effect or new intracranial hemorrhage. Stable left temporal lobe hemorrhage. Advanced chronic underlying mostly micro hemorrhages in the brain elsewhere. 3. 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 4. Top differential considerations to consider in addition to PRES include an underlying coagulopathy (such as protein C deficiency or other hypercoagulable state) and chronic vasculitis such as due to connective tissue disorder.   Electronically Signed   By: Lars Pinks M.D.   On: 02/04/2013 15:23    Review of Systems   Constitutional: Negative for fever and weight loss.  Eyes: Negative for blurred vision and double vision.  Respiratory: Positive for cough and shortness of breath.   Cardiovascular: Negative for chest pain.  Gastrointestinal: Negative for nausea, vomiting and abdominal pain.  Neurological: Positive for dizziness.    Blood pressure 126/78, pulse 78, temperature 97.6 F (36.4 C), temperature source Oral, resp. rate 18, height 5\' 9"  (1.753 m), weight 176 lb 5.9 oz (80 kg), SpO2 98.00%. Physical Exam  Constitutional: He is oriented to person, place, and time. He appears well-developed and well-nourished.  Cardiovascular: Normal rate and regular rhythm.   No murmur heard. Respiratory: Effort normal and breath sounds normal. He has no wheezes.  GI: Soft. Bowel sounds are normal. There is no tenderness.  Musculoskeletal: Normal range of motion.  Neurological: He is alert and oriented to person, place, and time. Coordination normal.  Skin: Skin is warm and dry.  Psychiatric: He has a normal mood and affect. His behavior is normal. Judgment and thought content normal.     Assessment/Plan New intracranial hematoma and R brain infarct Scheduled for cerebral arteriogram 1/9 Pt aware of procedure benefits and risks and agreeable to proceed Consent signed and in chart  Juan Glenn A 02/06/2013, 1:38 PM

## 2013-02-06 NOTE — Progress Notes (Signed)
Stroke Team Progress Note  HISTORY Juan Glenn is an 58 y.o. male who was admitted 11/19-12/03/2012: Patient presented to emergency room with respiratory failure and found to have bilateral multifocal pulmonary emboli and right sided DVT. He was intubated and on 11/19 and extubated 911/23. His hospital course was notable for pulmonary edema, hypernatremia, and respiratory insufficiency. He was started on Xeralto for his PE and DVT. Workup while in hospital included EEG, MRI brain wwo contrast, CTA, echo, and laboratory resting. EEG was normal and MRI brain which showed abnormal patchy areas of T2 signal intensity involving bilateral temporal lobes, occipital lobes, parietal lobes, and left frontal lobe. Some of these areas demonstrate mild enhancement. There is minimal petechial hemorrhage in the left frontal lobe and right parietal lobe. His CTA showed mild intracranial atherosclerosis. No evidence for embolic source on echocardiogram. Per neurology note, " Imaging was performed on 01/29/2013 that showed several areas of white matter changes which had progressed since his previous MRI of brain in September 2014. In addition, there is a new left lobar subacute hematoma, which was not previously seen on his CT head dated 11/6. Imaging was reviewed personally with radiology and changes are thought to be most consistent with PRES. Subacute hematoma may have bee due elevated blood pressure and that he is on anticoagulation for PE/DVT. " Dr Posey Pronto (neurology ) discussed the above findings with the patient and his primary care physician, Dr. Record, because I do not feel that he is safe taking Xeralto with known subacute intracranial hematoma since it cannot be reserved. Dr. Record would be managing his anticoagulation and would like the patient admitted for close observation as he is transitioned off Xeralto and to coumadin (keeping a narrow INR goal), which I think is reasonable. Patient was brought to ED and  neurology was asked to see patient, follow patient, and give further recommendations while in hospital.   Ischemic stroke was an incidental finding. Patient was not considered for TPA secondary to asymptomatic findings, currently on xarelto. He was admitted for further evaluation and treatment.  SUBJECTIVE His wife is at the bedside.  Overall he feels his condition remains stable.   OBJECTIVE Most recent Vital Signs: Filed Vitals:   02/06/13 0124 02/06/13 0200 02/06/13 0501 02/06/13 1020  BP: 123/75 116/73 120/74 132/89  Pulse: 80 78 79 81  Temp: 98 F (36.7 C) 98.4 F (36.9 C) 98 F (36.7 C) 98 F (36.7 C)  TempSrc: Oral Oral Oral Oral  Resp: 18 18 18 20   Height:      Weight:      SpO2: 100% 96% 99% 98%   CBG (last 3)   Recent Labs  02/05/13 1647 02/05/13 2130 02/06/13 0752  GLUCAP 115* 98 127*    IV Fluid Intake:   . sodium chloride Stopped (02/05/13 0844)    MEDICATIONS  . amLODipine  10 mg Oral Daily  . atorvastatin  20 mg Oral q1800  . insulin aspart  0-15 Units Subcutaneous TID WC  . labetalol  200 mg Oral BID  . levETIRAcetam  500 mg Oral BID  . pantoprazole  40 mg Oral Daily   PRN:  loratadine  Diet:  NPO Activity:  As tolerated DVT Prophylaxis:  SCDs   CLINICALLY SIGNIFICANT STUDIES Basic Metabolic Panel:   Recent Labs Lab 02/04/13 1136  NA 140  K 4.5  CL 101  CO2 24  GLUCOSE 75  BUN 9  CREATININE 1.03  CALCIUM 10.0   Liver Function Tests: No results  found for this basename: AST, ALT, ALKPHOS, BILITOT, PROT, ALBUMIN,  in the last 168 hours CBC:   Recent Labs Lab 02/04/13 1136  WBC 8.2  NEUTROABS 4.1  HGB 13.5  HCT 39.1  MCV 85.9  PLT 213   Coagulation:   Recent Labs Lab 02/04/13 1136 02/05/13 0455  LABPROT 15.2 13.6  INR 1.23 1.06   Cardiac Enzymes: No results found for this basename: CKTOTAL, CKMB, CKMBINDEX, TROPONINI,  in the last 168 hours Urinalysis: No results found for this basename: COLORURINE, APPERANCEUR,  LABSPEC, PHURINE, GLUCOSEU, HGBUR, BILIRUBINUR, KETONESUR, PROTEINUR, UROBILINOGEN, NITRITE, LEUKOCYTESUR,  in the last 168 hours Lipid Panel    Component Value Date/Time   CHOL 110 09/07/2012 0540   TRIG 116 12/21/2012 0500   HDL 43 09/07/2012 0540   CHOLHDL 2.6 09/07/2012 0540   VLDL 22 09/07/2012 0540   LDLCALC 45 09/07/2012 0540   HgbA1C  Lab Results  Component Value Date   HGBA1C 8.0* 12/24/2012    Urine Drug Screen:     Component Value Date/Time   LABOPIA NONE DETECTED 12/05/2012 0041   COCAINSCRNUR NONE DETECTED 12/05/2012 0041   LABBENZ NONE DETECTED 12/05/2012 0041   AMPHETMU NONE DETECTED 12/05/2012 0041   THCU NONE DETECTED 12/05/2012 0041   LABBARB NONE DETECTED 12/05/2012 0041    Alcohol Level: No results found for this basename: ETH,  in the last 168 hours  CT of the brain    MRI of the brain  02/04/2013   1. Possible punctate area of restricted diffusion in the anterior right frontal lobe subcortical white matter such as due to acute lacunar infarct. However, this might also be imaging artifact related to nearby chronic blood products. 2. No acute mass effect or new intracranial hemorrhage. Stable left temporal lobe hemorrhage. Advanced chronic underlying mostly micro hemorrhages in the brain elsewhere. 3. 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 4. Top differential considerations to consider in addition to PRES include an underlying coagulopathy (such as protein C deficiency or other hypercoagulable state) and chronic vasculitis such as due to connective tissue disorder.     CT angio of the brain  09/07/2012 Mild intracranial atherosclerotic type changes  2D Echocardiogram  12/20/2012 EF 55-60% with no source of embolus.  Carotid Doppler    CXR  12/29/2012 1. Significant improvement in bilateral edema or infiltrates with mild residual 2. Stable  cardiomegaly.  EKG  12/19/2012 sinus tachycardia.   Therapy Recommendations no therapy needs  Physical Exam   Pleasant middle aged african [place] male not in any distress.Awake alert. Afebrile. Head is nontraumatic. Neck is supple without bruit. Hearing is normal. Cardiac exam no murmur or gallop. Lungs are clear to auscultation. Distal pulses are well felt. Neurological Exam ;  Awake  Alert oriented x 3. Normal speech and language.intact attention, registration and short-term memory. Recall 2/3. eye movements full without nystagmus.fundi were not visualized. Vision acuity and fields appear normal. Hearing is normal. Palatal movements are normal. Face symmetric. Tongue midline. Normal strength, tone, reflexes and coordination. Normal sensation. Gait deferred. ASSESSMENT Juan Glenn is a 58 y.o. male who has a  complicated history of: Syncope, seizures, DVT, pulmonary embolism, silent left temporal intracerebral hematoma and now a tiny silent right brain subcortical infarct-all within the last 4-5 months  Aug 2014 - syncope,    . MRI showed white matter hyperintensities with a broad differential listed Sept 2014 -  Follow up MRI showed improvement in white matter disease - pt dx as PRES.  Nov 2014 - hospitalized with seizures due to hypertensive emergency, dx with new onset afib Nov - Dec 2014 respiratory failure, found to have PE and DVT, placed on xarelto.  Dec 2014 - f/u MRI showed progression or white matter disease with an asymptomatic right occipital and left temporal hemorrhage along with a few microhemorrhages.  Jan 2014 - MRI with increased microhemorrhages and an incidental small right frontal ischemic infarct.   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 Hypercoagulable labs pending  On xarelto prior to admission. Now on no antithrombotics due to intracerebral hemorrhages.  Has been off xarelto > 48 hours.   LDL 45 Diabetes, HgbA1c 8.0, goal < 7.0 (Nov 2014)  Hospital day # 2  TREATMENT/PLAN  Agree with plans to place a [place] filter at 1430  F/u Hypercoagulable panel   Cerebral angiogram -  last ate at 8a per pt. DR. Leonie Man called to discuss with radiology to see if it could be done today as well  Will need a LP - Dr. Leonie Man unable to do, will discuss with FP  Dr. Leonie Man discussed diagnosis, prognosis,  treatment options and plan of care with pt, wife and medical team.   Burnetta Sabin, MSN, RN, ANVP-BC, ANP-BC, GNP-BC [facility] Pager: [phone] 02/06/2013 11:40 AM  I have personally obtained a history, examined the patient, evaluated imaging results, and formulated the assessment and plan of care. I agree with the above. Antony Contras, MD

## 2013-02-06 NOTE — Progress Notes (Signed)
FMTS Attending  Note: Sharla Tankard,MD I  have seen and examined this patient, reviewed their chart. I have discussed this patient with the resident. I agree with the resident's findings, assessment and care plan.  

## 2013-02-06 NOTE — Procedures (Signed)
Interventional Radiology Procedure Note  Procedure: Placement of a [place] potentially retrievable IVC filter. Complications: None Recommendations: - Bedrest x 2 hrs with HOB elevated 30 degrees - Filter likely permanent given patient's history of HTN and ICH.  If his clinical status changes in the future, this filter can be removed up to several years after placement.   Signed,  Criselda Peaches, MD [facility]

## 2013-02-06 NOTE — Progress Notes (Signed)
Patient name: Juan Glenn    Medical record number: [id number]  Date of birth: 1955-05-31    Age: 58 y.o.    Gender: male  Primary Care Provider: RECORD,CHARLES  Outpt Neurologist Alda Berthold [phone]  Consultants: Neurology Code Status: Full Code  Pt Overview and Major Events to Date:  1/6: Admitted to hospital through ED and neuro consulted. Anticoagulation stopped. Repeat MRI with concerning but not operable findings. Stable, comfortable.  1/7: Full workup initiated for coagulopathy, demyelinating disease, connective tissue disease including LP. Discussed IVC filter and made plans to pursue.  1/8: Scheduled for Greenfield filter placement, MRA, and LP for tomorrow. Continue hypercoagulable panel.   Assessment and Plan:  Juan Glenn is a 58 y.o. male presenting with concerns for a new L lobar hematoma detected on outpatient MRI from his neurology office. He had two recent hospitalizations in November 2014 for seizures, aspiration pneumonia, and DVT with pulmonary emboli. PMH is significant for diabetes, hypertension with recent hypertensive emergency, hyperlipidemia, seizures with first episode 08/2012, and DVT with multifocal PE's.   #Microhemorrhages, L temporal hematoma, and white matter changes - These changes were detected on MRI 02/04/13 and were compared to several prior MRI images by inpatient neuro today. They are concerned that this is not a presentation consistent with PRES as was previously discussed. Patient's MRI findings do not seem to be reversing despite optimal blood pressure control. Please see their note for a more complete discussion of these indications.  - Hypercoagulable workup; so far these are negative - Greenfield filter placement today. - Cerebral angiogram scheduled today per neuro, If negative: LP will be performed tomorrow, will call today to ask if neuro wants to perform    #PE/DVT -  - Stop Xarelto and hold all anticoagulation due to  hematoma above  - SCD's  - Greenfield filter  #Atrial Fibrillation - Pt had Atrial fibrillation with RVR during a recent hospitalization - Continue tele and consider EKG - Strongly consider antiplatelet therapy (after IVC filter placement), Would suggest baby aspirin despite recent history of microhemorrhages, will ask if neuro agrees  #Seizures -  - no recent seizure activity  - continue Keppra 551m bid   #HTN - Patient reports normal values with maximum being 130's/80's at home  - continue labetolol 2018mbid  - Norvasc 1014m #Diabetes - Patient has had good control of his sugars at home with 80's-90's routinely  - discontinue home oral medications  - SSI while inpatient  - atorvastatin 29m22mily   #Hyperlipidemia -  - Continue home dose of Crestor   FEN/GI: Heart healthy diet  Prophylaxis: SCD's   Disposition: Pending neuro recommendations, IVC placement, MRA, LP tomorrow   Subjective: Patient was laying in bed comfortably with no vision or balance changes overnight. He slept well. Anxious to return home.  Objective:  Filed Vitals:   02/06/13 1309  BP: 126/78  Pulse: 78  Temp: 97.6 F (36.4 C)  Resp: 18   Exam:  General: NAD, sitting on the side of the bed.  HEENT: atraumatic, PERRL, no LAD, TM's clear  Cardiovascular: RRR, No MRG, pulses 2+ bilaterally and in all extremities  Respiratory: Non-labored respirations, CTAB  Abdomen: Soft, Non-tender, Non-distended  Extremities: Warm, non-edematous  Skin: intact, no rashes, dry, non-erythematous  Neuro: CN II-XII intact, reflexes not tested, Strength symmetric 5/5 in all extremities, Sensation intact, Cerebellar function not tested  Labs and Imaging:  02/05/13 ESR: 15 ANA: Neg C4: 31 WNL C3: 128 WNL AT  III: 95 WNL Homocysteine 10.5 WNL Cardiolipin antibodies: IgG 9 low, IgM 3 low, IgA 10 low Beta-2-Glycoprotein IgG,IgM,IgA: WNL ACE 19 WNL RPR: Negative HIV: Negative  EXAM:  MRI HEAD WITHOUT CONTRAST  02/04/13  TECHNIQUE:  IMPRESSION:  1. Possible punctate area of restricted diffusion in the anterior  right frontal lobe subcortical white matter such as due to acute  lacunar infarct. However, this might also be imaging artifact  related to nearby chronic blood products.  2. No acute mass effect or new intracranial hemorrhage. Stable left  temporal lobe hemorrhage. Advanced chronic underlying mostly micro  hemorrhages in the brain elsewhere.  3. 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  4. Top differential considerations to consider in addition to PRES  include an underlying coagulopathy (such as protein C deficiency or  other hypercoagulable state) and chronic vasculitis such as due to  connective tissue disorder.  Electronically Signed  By: Lars Pinks M.D.  On: 02/04/2013 15:23  Thank you, Charline Bills MSIV   I have seen and evaluated the patient with the medical student and I agree with her documentation above. My annotations are noted in blue.   Physical exam:  Filed Vitals:   02/06/13 1309  BP: 126/78  Pulse: 78  Temp: 97.6 F (36.4 C)  Resp: 18    Gen: NAD, alert, cooperative with exam, pleasant  HEENT: NCAT, PERRL, mmm  CV: RRR, good S1/S2, no murmur  Resp: CTABL, no wheezes, non-labored  Abd: SNTND, BS present, no guarding or organomegaly  Ext: No edema, warm  Neuro: Alert and oriented, strength 5/5 and sensation intact in all 4 extremities

## 2013-02-06 NOTE — H&P (Signed)
Juan Glenn is an 58 y.o. male.   Chief Complaint: scheduled for retrievable inferior vena cava filter placement Pt was admitted in Nov 2014 for seizures; PNA and new extensive B DVT/PE Discharged on Xarelto OP MRI had been ordered by neurologist as a follow up for seizures. Pt now admitted after MRI 01/29/13 revealed new L intracranial hematoma Malignant HTN Now off Xarelto Ongoing hypercoagulable studies Request for retrievable IVC filter  HPI:  HTN; DM; HLD; B DVT/PE 11/14; intracranial hematoma  Past Medical History  Diagnosis Date  . Diabetes mellitus without complication 6599  . Renal disorder   . Hyperlipidemia   . Difficult intubation   . Pulmonary embolism   . PRES (posterior reversible encephalopathy syndrome) 09/2012    Past Surgical History  Procedure Laterality Date  . No surgical history  09/2012    Family History  Problem Relation Age of Onset  . Diabetes Mother   . Congestive Heart Failure Mother     Died, 3s  . Heart attack Father     Died, 42  . Diabetes Brother   . Hypertension Sister   . Cancer Brother     Died, 60   Social History:  reports that he has never smoked. He has never used smokeless tobacco. He reports that he does not drink alcohol or use illicit drugs.  Allergies:  Allergies  Allergen Reactions  . Lisinopril     MD thinks that this medication caused patient to "pass out" "faint".     Medications Prior to Admission  Medication Sig Dispense Refill  . amLODipine (NORVASC) 10 MG tablet Take 1 tablet (10 mg total) by mouth daily.  30 tablet    . aspirin EC 81 MG tablet Take 1 tablet (81 mg total) by mouth daily.      Marland Kitchen glimepiride (AMARYL) 2 MG tablet Take 1 tablet (2 mg total) by mouth 2 (two) times daily with a meal. Breakfast and dinner  60 tablet  3  . labetalol (NORMODYNE) 200 MG tablet Take 1 tablet (200 mg total) by mouth 2 (two) times daily.  60 tablet  3  . levETIRAcetam (KEPPRA) 500 MG tablet Take 1 tablet (500 mg  total) by mouth 2 (two) times daily.  60 tablet  6  . Linagliptin-Metformin HCl (JENTADUETO) 2.05-998 MG TABS Take 1 tablet by mouth daily.  30 tablet  3  . loratadine (CLARITIN) 10 MG tablet Take 10 mg by mouth daily as needed for allergies.      . Multiple Vitamin (MULTIVITAMIN WITH MINERALS) TABS tablet Take 1 tablet by mouth daily.      . pantoprazole (PROTONIX) 40 MG tablet Take 1 tablet (40 mg total) by mouth daily.  30 tablet  3  . Rivaroxaban (XARELTO) 20 MG TABS tablet Take 1 tablet (20 mg total) by mouth daily with supper. Start taking 20 mg once a day after 12/15.  30 tablet  5  . rosuvastatin (CRESTOR) 10 MG tablet Take 10 mg by mouth daily.        Results for orders placed during the hospital encounter of 02/04/13 (from the past 48 hour(s))  CBC WITH DIFFERENTIAL     Status: None   Collection Time    02/04/13 11:36 AM      Result Value Range   WBC 8.2  4.0 - 10.5 K/uL   RBC 4.55  4.22 - 5.81 MIL/uL   Hemoglobin 13.5  13.0 - 17.0 g/dL   HCT 39.1  39.0 - 52.0 %  MCV 85.9  78.0 - 100.0 fL   MCH 29.7  26.0 - 34.0 pg   MCHC 34.5  30.0 - 36.0 g/dL   RDW 13.8  11.5 - 15.5 %   Platelets 213  150 - 400 K/uL   Neutrophils Relative % 50  43 - 77 %   Neutro Abs 4.1  1.7 - 7.7 K/uL   Lymphocytes Relative 39  12 - 46 %   Lymphs Abs 3.2  0.7 - 4.0 K/uL   Monocytes Relative 9  3 - 12 %   Monocytes Absolute 0.8  0.1 - 1.0 K/uL   Eosinophils Relative 2  0 - 5 %   Eosinophils Absolute 0.1  0.0 - 0.7 K/uL   Basophils Relative 1  0 - 1 %   Basophils Absolute 0.0  0.0 - 0.1 K/uL  BASIC METABOLIC PANEL     Status: Abnormal   Collection Time    02/04/13 11:36 AM      Result Value Range   Sodium 140  137 - 147 mEq/L   Potassium 4.5  3.7 - 5.3 mEq/L   Chloride 101  96 - 112 mEq/L   CO2 24  19 - 32 mEq/L   Glucose, Bld 75  70 - 99 mg/dL   BUN 9  6 - 23 mg/dL   Creatinine, Ser 1.03  0.50 - 1.35 mg/dL   Calcium 10.0  8.4 - 10.5 mg/dL   GFR calc non Af Amer 78 (*) >90 mL/min   GFR calc  Af Amer >90  >90 mL/min   Comment: (NOTE)     The eGFR has been calculated using the CKD EPI equation.     This calculation has not been validated in all clinical situations.     eGFR's persistently <90 mL/min signify possible Chronic Kidney     Disease.  APTT     Status: None   Collection Time    02/04/13 11:36 AM      Result Value Range   aPTT 37  24 - 37 seconds   Comment:            IF BASELINE aPTT IS ELEVATED,     SUGGEST PATIENT RISK ASSESSMENT     BE USED TO DETERMINE APPROPRIATE     ANTICOAGULANT THERAPY.  PROTIME-INR     Status: None   Collection Time    02/04/13 11:36 AM      Result Value Range   Prothrombin Time 15.2  11.6 - 15.2 seconds   INR 1.23  0.00 - 1.49  GLUCOSE, CAPILLARY     Status: None   Collection Time    02/04/13  4:43 PM      Result Value Range   Glucose-Capillary 71  70 - 99 mg/dL  GLUCOSE, CAPILLARY     Status: Abnormal   Collection Time    02/04/13 11:20 PM      Result Value Range   Glucose-Capillary 104 (*) 70 - 99 mg/dL   Comment 1 Notify RN     Comment 2 Documented in Chart    PROTIME-INR     Status: None   Collection Time    02/05/13  4:55 AM      Result Value Range   Prothrombin Time 13.6  11.6 - 15.2 seconds   INR 1.06  0.00 - 1.49  GLUCOSE, CAPILLARY     Status: None   Collection Time    02/05/13  6:53 AM      Result Value  Range   Glucose-Capillary 85  70 - 99 mg/dL   Comment 1 Notify RN     Comment 2 Documented in Chart    GLUCOSE, CAPILLARY     Status: Abnormal   Collection Time    02/05/13 11:27 AM      Result Value Range   Glucose-Capillary 152 (*) 70 - 99 mg/dL   Comment 1 Documented in Chart    SEDIMENTATION RATE     Status: None   Collection Time    02/05/13  1:26 PM      Result Value Range   Sed Rate 15  0 - 16 mm/hr  RPR     Status: None   Collection Time    02/05/13  1:26 PM      Result Value Range   RPR NON REACTIVE  NON REACTIVE   Comment: Performed at [place]     Status: None    Collection Time    02/05/13  1:26 PM      Result Value Range   Complement C4, Body Fluid 31  10 - 40 mg/dL   Comment: Performed at [place]     Status: None   Collection Time    02/05/13  1:26 PM      Result Value Range   C3 Complement 128  90 - 180 mg/dL   Comment: Performed at [facility]     Status: None   Collection Time    02/05/13  1:26 PM      Result Value Range   AntiThromb III Func 95  75 - 120 %  HOMOCYSTEINE     Status: None   Collection Time    02/05/13  1:26 PM      Result Value Range   Homocysteine 10.5  4.0 - 15.4 umol/L   Comment: Performed at [facility]  HIV ANTIBODY (ROUTINE TESTING)     Status: None   Collection Time    02/05/13  1:26 PM      Result Value Range   HIV NON REACTIVE  NON REACTIVE   Comment: Performed at [place]     Status: None   Collection Time    02/05/13  1:26 PM      Result Value Range   Angiotensin-Converting Enzyme 19  8 - 52 U/L   Comment: Performed at [place], CAPILLARY     Status: Abnormal   Collection Time    02/05/13  4:47 PM      Result Value Range   Glucose-Capillary 115 (*) 70 - 99 mg/dL   Comment 1 Documented in Chart    GLUCOSE, CAPILLARY     Status: None   Collection Time    02/05/13  9:30 PM      Result Value Range   Glucose-Capillary 98  70 - 99 mg/dL  GLUCOSE, CAPILLARY     Status: Abnormal   Collection Time    02/06/13  7:52 AM      Result Value Range   Glucose-Capillary 127 (*) 70 - 99 mg/dL   Mr Brain Wo Contrast  02/04/2013   CLINICAL DATA:  58 year old male with recent intracranial hemorrhage, suspected posterior reversible encephalopathy syndrome. History of hypertension and diabetes. History of DVT and PE, being switched from xarelto to Coumadin. Originally presented with seizure in August.  EXAM: MRI HEAD WITHOUT CONTRAST  TECHNIQUE: Multiplanar, multiecho  pulse sequences of  the brain and surrounding structures were obtained without intravenous contrast.  COMPARISON:  01/29/2013 and earlier.  FINDINGS: 2-3 mm focus of restricted diffusion suggested in the anterior right frontal lobe subcortical white matter, but this is in the vicinity of an area of chronic blood products (series 8, image 101) and may simply represent blood related diffusion susceptibility artifact. Similar artifact is noted with the parenchymal left temporal lobe hemorrhage. That hemorrhage appears stable in size and configuration.  Numerous chronic micro hemorrhages throughout the brain (series 8, 801), including a previous macroscopic hemorrhage in the right occipital lobe probably similar to that now seen in the left temporal lobe.  No other areas of restricted diffusion. Major intracranial vascular flow voids are stable.  Gray and white matter T2 and FLAIR hyperintensity scattered in both cerebral hemispheres has not significantly changed allowing for differences in technique (3 tesla imaging today). Stable mild edema without significant mass effect in the left temporal lobe. No new areas of T2 or FLAIR signal abnormality.  Stable and negative pituitary, cervicomedullary junction and visualized cervical spine. Normal bone marrow signal. Visible internal auditory structures appear normal. Visualized orbit soft tissues are within normal limits. Negative scalp soft tissues.  IMPRESSION: 1. Possible punctate area of restricted diffusion in the anterior right frontal lobe subcortical white matter such as due to acute lacunar infarct. However, this might also be imaging artifact related to nearby chronic blood products. 2. No acute mass effect or new intracranial hemorrhage. Stable left temporal lobe hemorrhage. Advanced chronic underlying mostly micro hemorrhages in the brain elsewhere. 3. Stable signal changes in the peripheral gray and white matter thought related to posterior reversible encephalopathy syndrome, in  this patient with diabetes and hypertension. 4. Top differential considerations to consider in addition to PRES include an underlying coagulopathy (such as protein C deficiency or other hypercoagulable state) and chronic vasculitis such as due to connective tissue disorder.   Electronically Signed   By: Juan Glenn M.D.   On: 02/04/2013 15:23    Review of Systems  Constitutional: Negative for fever and weight loss.  Eyes: Negative for blurred vision and double vision.  Respiratory: Negative for shortness of breath.   Cardiovascular: Negative for chest pain.  Gastrointestinal: Negative for nausea, vomiting and abdominal pain.  Neurological: Positive for headaches. Negative for dizziness and weakness.    Blood pressure 120/74, pulse 79, temperature 98 F (36.7 C), temperature source Oral, resp. rate 18, height 5' 9" (1.753 m), weight 176 lb 5.9 oz (80 kg), SpO2 99.00%. Physical Exam  Constitutional: He is oriented to person, place, and time. He appears well-developed and well-nourished.  Cardiovascular: Normal rate, regular rhythm and normal heart sounds.   No murmur heard. Respiratory: Effort normal and breath sounds normal.  GI: Soft. Bowel sounds are normal. There is no tenderness.  Musculoskeletal: Normal range of motion.  Neurological: He is alert and oriented to person, place, and time. Coordination normal.  Skin: Skin is dry.  Psychiatric: He has a normal mood and affect. His behavior is normal. Judgment and thought content normal.     Assessment/Plan L intracranial hematoma; malignant HTN Hx Sz Had been on Xarelto for B DVT/PE dx 11/14 Off Xarelto now Scheduled for retrievable IVC filter Pt aware of procedure benefits and risks and agreeable to proceed Consent signed and in chart   Amayia Ciano A 02/06/2013, 10:19 AM

## 2013-02-07 ENCOUNTER — Inpatient Hospital Stay (HOSPITAL_COMMUNITY): Payer: BC Managed Care – PPO

## 2013-02-07 LAB — PROTEIN AND GLUCOSE, CSF
GLUCOSE CSF: 59 mg/dL (ref 43–76)
TOTAL PROTEIN, CSF: 57 mg/dL — AB (ref 15–45)

## 2013-02-07 LAB — GLUCOSE, CAPILLARY
GLUCOSE-CAPILLARY: 93 mg/dL (ref 70–99)
GLUCOSE-CAPILLARY: 175 mg/dL — AB (ref 70–99)
Glucose-Capillary: 112 mg/dL — ABNORMAL HIGH (ref 70–99)

## 2013-02-07 LAB — GRAM STAIN

## 2013-02-07 LAB — CSF CELL COUNT WITH DIFFERENTIAL
EOS CSF: NONE SEEN % (ref 0–1)
Eosinophils, CSF: NONE SEEN % (ref 0–1)
RBC Count, CSF: 21 /mm3 — ABNORMAL HIGH
RBC Count, CSF: 350 /mm3 — ABNORMAL HIGH
SEGMENTED NEUTROPHILS-CSF: NONE SEEN % (ref 0–6)
Segmented Neutrophils-CSF: NONE SEEN % (ref 0–6)
Tube #: 1
Tube #: 4
WBC CSF: 2 /mm3 (ref 0–5)
WBC, CSF: 1 /mm3 (ref 0–5)

## 2013-02-07 LAB — FACTOR 5 LEIDEN

## 2013-02-07 LAB — COMPLEMENT, TOTAL: Compl, Total (CH50): >60 U/mL — ABNORMAL HIGH (ref 31–60)

## 2013-02-07 LAB — PROTEIN C, TOTAL: PROTEIN C, TOTAL: 110 % (ref 72–160)

## 2013-02-07 LAB — PROTEIN S, TOTAL: Protein S Ag, Total: 74 % (ref 60–150)

## 2013-02-07 LAB — PROTHROMBIN GENE MUTATION

## 2013-02-07 MED ORDER — SODIUM CHLORIDE 0.9 % IV SOLN: INTRAVENOUS | Status: AC

## 2013-02-07 MED ORDER — MIDAZOLAM HCL 2 MG/2ML IJ SOLN
INTRAMUSCULAR | Status: AC | PRN
  Administered 2013-02-07: 1 mg via INTRAVENOUS

## 2013-02-07 MED ORDER — IOHEXOL 300 MG/ML  SOLN
150.0000 mL | Freq: Once | INTRAMUSCULAR | Status: AC | PRN
  Administered 2013-02-07: 60 mL via INTRA_ARTERIAL

## 2013-02-07 MED ORDER — FENTANYL CITRATE 0.05 MG/ML IJ SOLN
INTRAMUSCULAR | Status: AC
  Filled 2013-02-07: qty 2

## 2013-02-07 MED ORDER — SODIUM CHLORIDE 0.9 % IV SOLN
INTRAVENOUS | Status: AC | PRN
  Administered 2013-02-07: 75 mL/h via INTRAVENOUS

## 2013-02-07 MED ORDER — MIDAZOLAM HCL 2 MG/2ML IJ SOLN
INTRAMUSCULAR | Status: AC
  Filled 2013-02-07: qty 2

## 2013-02-07 MED ORDER — FENTANYL CITRATE 0.05 MG/ML IJ SOLN
INTRAMUSCULAR | Status: AC | PRN
  Administered 2013-02-07: 25 ug via INTRAVENOUS

## 2013-02-07 NOTE — Procedures (Signed)
Lumbar puncture performed under fluoroscopy at L3-4 on left.  Details dictated in radiology report.  No immediate complications.

## 2013-02-07 NOTE — Procedures (Signed)
S/P 4 vessel cerebral arteriogram. RT CFA approach. Findings. 1.mild non specific segmental narrowing of distal Lt pericallosal branches. Venous grainage WNLs

## 2013-02-07 NOTE — ED Notes (Signed)
MD at bedside.  Findings explained.

## 2013-02-07 NOTE — ED Notes (Signed)
5 Fr exoseal closure by [facility], RT

## 2013-02-07 NOTE — Procedures (Signed)
[facility] Pager: [phone] If no response, call [phone]  

## 2013-02-07 NOTE — Progress Notes (Signed)
Patient name: Juan Glenn    Medical record number: [id number]  Date of birth: Apr 16, 1955    Age: 58 y.o.    Gender: male  Primary Care Provider: RECORD,CHARLES  Outpt Neurologist Alda Berthold [phone]  Consultants: Neurology Code Status: Full Code   Pt Overview and Major Events to Date:  1/6: Admitted to hospital through ED and neuro consulted. Anticoagulation stopped. Repeat MRI with concerning but not operable findings. Stable, comfortable.  1/7: Full workup initiated for coagulopathy, demyelinating disease, connective tissue disease including LP. Discussed IVC filter and made plans to pursue.  1/8: Scheduled for Greenfield filter placement, MRA, and LP for tomorrow. Continue hypercoagulable panel.  1/9: Bard Denali IVC filter placed. Angiogram performed. LP to be performed this afternoon. Anticipate d/c in AM  Assessment and Plan:  Juan Glenn is a 58 y.o. male presenting with concerns for a new L lobar hematoma detected on outpatient MRI from his neurology office. He had two recent hospitalizations in November 2014 for seizures, aspiration pneumonia, and DVT with pulmonary emboli. PMH is significant for diabetes, hypertension with recent hypertensive emergency, hyperlipidemia, seizures with first episode 08/2012, and DVT with multifocal PE's.   # Microhemorrhages, L temporal hematoma, and white matter changes - These changes were detected on MRI 02/04/13 and were compared to several prior MRI images by inpatient neuro. They are concerned that this is not a presentation consistent with PRES as was previously discussed. Patient's MRI findings do not seem to be reversing despite optimal blood pressure control. Please see their note for a more complete discussion of these indications.  - Hypercoagulable workup; so far these are negative  - Cerebral angiogram performed 1/9. No impressive findings. - LP today for MS workup  # PE/DVT -  - Stop Xarelto and hold all  anticoagulation due to hematoma above  - SCD's  - Bard Denali IVC filter placed 1/9  # Atrial Fibrillation - Pt had Atrial fibrillation with RVR during a recent hospitalization  - Continue tele - Difficult situation.  Likely needs to have antiplatelet treatment if okay with neurology.   # Seizures -  - no recent seizure activity  - continue Keppra 500mg  bid   # HTN - Patient reports normal values with maximum being 130's/80's at home  - Given normal values PRES seems less likely. - continue labetolol 200mg  bid  - Norvasc 10mg    # Diabetes - Patient has had good control of his sugars at home with 80's-90's routinely  - discontinue home oral medications and avoid metformin for 72 hours after angiogram  - SSI while inpatient  - atorvastatin 20mg  daily   # Hyperlipidemia -  - Continue home dose of Crestor   FEN/GI: Heart healthy diet, NPO until after LP Prophylaxis: SCD's   Disposition: Plan to D/c in AM after LP and neuro recommendations for anti-platelet therapy  Subjective: Patient agreeable to LP but wanting to go home.  Objective:  Filed Vitals:   02/07/13 1006  BP: 124/78  Pulse: 77  Temp: 97.7 F (36.5 C)  Resp: 20    PHYSICAL EXAM: GENERAL: Adult AA  male. In no discomfort; no respiratory distress  PSYCH: alert and appropriate, good insight   HNEENT: EOMi, no nystagmus  CARDIO:   LUNGS:   ABDOMEN: Soft nontender  EXTREM:  Warm, well perfused.  Moves all 4 extremities spontaneously; no lateralization.  No noted foot lesions.  Distal pulses 2+/4.  trace pretibial edema.  GU:   SKIN:     Labs  and Imaging:   02/05/2013 13:26  Homocysteine 10.5  Sed Rate 15  Anticardiolipin IgA 10 (L)  Anticardiolipin IgG 9 (L)  Anticardiolipin IgM 3 (L)  PTT Lupus Anticoagulant 35.5  PTTLA Confirmation NOT APPL  PTTLA 4:1 Mix NOT APPL  DRVVT 32.4  Drvvt confirmation NOT APPL  dRVVT Incubated 1:1 Mix NOT APPL  Lupus Anticoagulant NOT DETECTED  Beta-2 Glyco I IgG 7   Beta-2-Glycoprotein I IgA 4  Beta-2-Glycoprotein I IgM 18  AntiThromb III Func 95  Recommendations-PTGENE: (NOTE)  Protein C Activity 198 (H)  Protein C, Total 110  Protein S Activity 101  Protein S Ag, Total 74  ANA NEGATIVE  Angiotensin-Converting Enzyme 19  C3 Complement 128  Complement C4, Body Fluid 31  RPR NON REACTIVE  HIV NON REACTIVE   EXAM:  MRI HEAD WITHOUT CONTRAST 02/04/13  IMPRESSION:  1. Possible punctate area of restricted diffusion in the anterior  right frontal lobe subcortical white matter such as due to acute  lacunar infarct. However, this might also be imaging artifact  related to nearby chronic blood products.  2. No acute mass effect or new intracranial hemorrhage. Stable left  temporal lobe hemorrhage. Advanced chronic underlying mostly micro  hemorrhages in the brain elsewhere.  3. 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  4. Top differential considerations to consider in addition to PRES  include an underlying coagulopathy (such as protein C deficiency or  other hypercoagulable state) and chronic vasculitis such as due to  connective tissue disorder.    02/07/13 - IR Brain Angiogram: Mild non-specific segmental narrowing of distal Lt pericallosal branches.  Venous drainage WNL  Gerda Diss, DO [facility] Resident - PGY-3 02/07/2013 5:58 PM

## 2013-02-07 NOTE — Progress Notes (Signed)
PT Cancellation Note  Patient Details Name: Juan Glenn MRN: [id number] DOB: [birth year]   Cancelled Treatment:    Reason Eval/Treat Not Completed: Patient at procedure or test/unavailable;  Will attempt to see tomorrow.   WYNN,CYNDI 02/07/2013, 3:56 PM

## 2013-02-07 NOTE — Progress Notes (Signed)
Stroke Team Progress Note  HISTORY Juan Glenn is an 58 y.o. male who was admitted 11/19-12/03/2012: Patient presented to emergency room with respiratory failure and found to have bilateral multifocal pulmonary emboli and right sided DVT. He was intubated and on 11/19 and extubated 911/23. His hospital course was notable for pulmonary edema, hypernatremia, and respiratory insufficiency. He was started on Xeralto for his PE and DVT. Workup while in hospital included EEG, MRI brain wwo contrast, CTA, echo, and laboratory resting. EEG was normal and MRI brain which showed abnormal patchy areas of T2 signal intensity involving bilateral temporal lobes, occipital lobes, parietal lobes, and left frontal lobe. Some of these areas demonstrate mild enhancement. There is minimal petechial hemorrhage in the left frontal lobe and right parietal lobe. His CTA showed mild intracranial atherosclerosis. No evidence for embolic source on echocardiogram. Per neurology note, " Imaging was performed on 01/29/2013 that showed several areas of white matter changes which had progressed since his previous MRI of brain in September 2014. In addition, there is a new left lobar subacute hematoma, which was not previously seen on his CT head dated 11/6. Imaging was reviewed personally with radiology and changes are thought to be most consistent with PRES. Subacute hematoma may have bee due elevated blood pressure and that he is on anticoagulation for PE/DVT. " Dr Posey Pronto (neurology ) discussed the above findings with the patient and his primary care physician, Dr. Record, because I do not feel that he is safe taking Xeralto with known subacute intracranial hematoma since it cannot be reserved. Dr. Record would be managing his anticoagulation and would like the patient admitted for close observation as he is transitioned off Xeralto and to coumadin (keeping a narrow INR goal), which I think is reasonable. Patient was brought to ED and  neurology was asked to see patient, follow patient, and give further recommendations while in hospital.   Ischemic stroke was an incidental finding. Patient was not considered for TPA secondary to asymptomatic findings, currently on xarelto. He was admitted for further evaluation and treatment.  SUBJECTIVE His wife is at the bedside.  Overall he feels his condition remains stable. Cerebral angiogram this a.m. shows the beaded appearance to suggest vasculitis. Most of the hypercoagulable panel labs which are back so far are unremarkable. Vasculitis labs are all normal. HIV and RPR are normal. Angiotensin converting enzyme is normal OBJECTIVE Most recent Vital Signs: Filed Vitals:   02/07/13 0935 02/07/13 0949 02/07/13 1006 02/07/13 1501  BP: 132/80 136/82 124/78 112/71  Pulse:  72 77 81  Temp:   97.7 F (36.5 C) 98.6 F (37 C)  TempSrc:   Oral Oral  Resp: 20 20 20 18   Height:      Weight:      SpO2: 97% 100% 96% 98%   CBG (last 3)   Recent Labs  02/06/13 1644 02/06/13 2107 02/07/13 0632  GLUCAP 69* 144* 112*    IV Fluid Intake:   . sodium chloride Stopped (02/05/13 0844)    MEDICATIONS  . amLODipine  10 mg Oral Daily  . atorvastatin  20 mg Oral q1800  . fentaNYL      . insulin aspart  0-15 Units Subcutaneous TID WC  . labetalol  200 mg Oral BID  . levETIRAcetam  500 mg Oral BID  . midazolam      . pantoprazole  40 mg Oral Daily   PRN:  loratadine  Diet:  NPO Activity:  As tolerated DVT Prophylaxis:  SCDs  CLINICALLY SIGNIFICANT STUDIES Basic Metabolic Panel:   Recent Labs Lab 02/04/13 1136  NA 140  K 4.5  CL 101  CO2 24  GLUCOSE 75  BUN 9  CREATININE 1.03  CALCIUM 10.0   Liver Function Tests: No results found for this basename: AST, ALT, ALKPHOS, BILITOT, PROT, ALBUMIN,  in the last 168 hours CBC:   Recent Labs Lab 02/04/13 1136  WBC 8.2  NEUTROABS 4.1  HGB 13.5  HCT 39.1  MCV 85.9  PLT 213   Coagulation:   Recent Labs Lab  02/04/13 1136 02/05/13 0455  LABPROT 15.2 13.6  INR 1.23 1.06   Cardiac Enzymes: No results found for this basename: CKTOTAL, CKMB, CKMBINDEX, TROPONINI,  in the last 168 hours Urinalysis: No results found for this basename: COLORURINE, APPERANCEUR, LABSPEC, PHURINE, GLUCOSEU, HGBUR, BILIRUBINUR, KETONESUR, PROTEINUR, UROBILINOGEN, NITRITE, LEUKOCYTESUR,  in the last 168 hours Lipid Panel    Component Value Date/Time   CHOL 110 09/07/2012 0540   TRIG 116 12/21/2012 0500   HDL 43 09/07/2012 0540   CHOLHDL 2.6 09/07/2012 0540   VLDL 22 09/07/2012 0540   LDLCALC 45 09/07/2012 0540   HgbA1C  Lab Results  Component Value Date   HGBA1C 8.0* 12/24/2012    Urine Drug Screen:     Component Value Date/Time   LABOPIA NONE DETECTED 12/05/2012 0041   COCAINSCRNUR NONE DETECTED 12/05/2012 0041   LABBENZ NONE DETECTED 12/05/2012 0041   AMPHETMU NONE DETECTED 12/05/2012 0041   THCU NONE DETECTED 12/05/2012 0041   LABBARB NONE DETECTED 12/05/2012 0041    Alcohol Level: No results found for this basename: ETH,  in the last 168 hours  CT of the brain    MRI of the brain  02/04/2013   1. Possible punctate area of restricted diffusion in the anterior right frontal lobe subcortical white matter such as due to acute lacunar infarct. However, this might also be imaging artifact related to nearby chronic blood products. 2. No acute mass effect or new intracranial hemorrhage. Stable left temporal lobe hemorrhage. Advanced chronic underlying mostly micro hemorrhages in the brain elsewhere. 3. 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 4. Top differential considerations to consider in addition to PRES include an underlying coagulopathy (such as protein C deficiency or other hypercoagulable state) and chronic vasculitis such as due to connective tissue disorder.     CT angio of the brain   09/07/2012 Mild intracranial atherosclerotic type changes  2D Echocardiogram  12/20/2012 EF 55-60% with no source of embolus.  Carotid Doppler    CXR  12/29/2012 1. Significant improvement in bilateral edema or infiltrates with mild residual 2. Stable cardiomegaly.  EKG  12/19/2012 sinus tachycardia.   Therapy Recommendations no therapy needs  Physical Exam   Pleasant middle aged african [place] male not in any distress.Awake alert. Afebrile. Head is nontraumatic. Neck is supple without bruit. Hearing is normal. Cardiac exam no murmur or gallop. Lungs are clear to auscultation. Distal pulses are well felt. Neurological Exam ;  Awake  Alert oriented x 3. Normal speech and language.intact attention, registration and short-term memory. Recall 2/3. eye movements full without nystagmus.fundi were not visualized. Vision acuity and fields appear normal. Hearing is normal. Palatal movements are normal. Face symmetric. Tongue midline. Normal strength, tone, reflexes and coordination. Normal sensation. Gait deferred. ASSESSMENT Mr. Juan Glenn is a 58 y.o. male who has a  complicated history of: Syncope,  seizures, DVT, pulmonary embolism, silent left temporal intracerebral hematoma and now a tiny silent right brain subcortical infarct-all within the last 4-5 months  Aug 2014 - syncope,    . MRI showed white matter hyperintensities with a broad differential listed Sept 2014 - Follow up MRI showed improvement in white matter disease - pt dx as PRES.  Nov 2014 - hospitalized with seizures due to hypertensive emergency, dx with new onset afib Nov - Dec 2014 respiratory failure, found to have PE and DVT, placed on xarelto.  Dec 2014 - f/u MRI showed progression or white matter disease with an asymptomatic right occipital and left temporal hemorrhage along with a few microhemorrhages.  Jan 2014 - MRI with increased microhemorrhages and an incidental small right frontal ischemic infarct.   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 Hypercoagulable labs pending  On xarelto prior to admission. Now on no antithrombotics due to intracerebral hemorrhages.  Has been off xarelto > 48 hours.   LDL 45 Diabetes, HgbA1c 8.0, goal < 7.0 (Nov 2014)  Hospital day # 3  TREATMENT/PLAN  I had a long discussion the patient and wife regarding the results of the tests performed so far as the left cerebral angiogram and answered questions. I explained the need to do a diagnostic spinal tap to evaluate for evidence of chronic meningoencephalitis or demyelinating illness given his MRI findings. I discussed risk benefits of spinal tap and answered questions  F/u Hypercoagulable panel   Dr. Leonie Man discussed diagnosis, prognosis,  treatment options and plan of care with pt, wife and medical team.   Burnetta Sabin, MSN, RN, ANVP-BC, ANP-BC, GNP-BC [facility] Pager: [phone] 02/07/2013 3:15 PM  I have personally obtained a history, examined the patient, evaluated imaging results, and formulated the assessment and plan of care. I agree with the above. Antony Contras, MD

## 2013-02-07 NOTE — Procedures (Signed)
Procedure Note: Lumbar Puncture Dx: Hemorrhagic Stroke  Patient given informed consent, signed copy in the chart. Appropriate time out taken.    Lumbar area L3-L4 was prepped in sterile fashion. Lidocaine was provided for anesthesia. Three attempts to obtain spinal fluid were unsuccessful. Patient denied any discomfort/pain with the procedure.     No complications.  5 cc of estimated blood loss.    Pt tolerated the procedure well without complications. IR was consulted to obtain LP.

## 2013-02-07 NOTE — ED Notes (Signed)
O2 2l/[place] started 

## 2013-02-08 LAB — GLUCOSE, CAPILLARY
Glucose-Capillary: 129 mg/dL — ABNORMAL HIGH (ref 70–99)
Glucose-Capillary: 120 mg/dL — ABNORMAL HIGH (ref 70–99)
Glucose-Capillary: 134 mg/dL — ABNORMAL HIGH (ref 70–99)

## 2013-02-08 NOTE — Progress Notes (Signed)
Patient name: Juan Glenn    Medical record number: [id number]  Date of birth: 06-21-1955    Age: 58 y.o.    Gender: male  Primary Care Provider: RECORD,CHARLES  Outpt Neurologist Alda Berthold [phone]  Consultants: Neurology Code Status: Full Code   Pt Overview and Major Events to Date:  1/6: Admitted to hospital through ED and neuro consulted. Anticoagulation stopped. Repeat MRI with concerning but not operable findings. Stable, comfortable.  1/7: Full workup initiated for coagulopathy, demyelinating disease, connective tissue disease including LP. Discussed IVC filter and made plans to pursue.  1/8: Scheduled for Greenfield filter placement, MRA, and LP for tomorrow. Continue hypercoagulable panel.  1/9: Bard Denali IVC filter placed. Angiogram performed. LP to be performed this afternoon. Anticipate d/c in AM   Assessment and Plan:  Juan Glenn is a 58 y.o. male presenting with concerns for a new L lobar hematoma detected on outpatient MRI from his neurology office. He had two recent hospitalizations in November 2014 for seizures, aspiration pneumonia, and DVT with pulmonary emboli. PMH is significant for diabetes, hypertension with recent hypertensive emergency, hyperlipidemia, seizures with first episode 08/2012, and DVT with multifocal PE's.   # Microhemorrhages, L temporal hematoma, and white matter changes - These changes were detected on MRI 02/04/13 and were compared to several prior MRI images by inpatient neuro. They are concerned that this is not a presentation consistent with PRES as was previously discussed. Patient's MRI findings do not seem to be reversing despite optimal blood pressure control. Please see their note for a more complete discussion of these indications.  - Hypercoagulable workup; so far these are negative  - Cerebral angiogram performed 1/9. No impressive findings. - LP on 1/9- cell count and protein/glucose not indicative of infectious  etiology, other labs pending - Likely DC this am.   # PE/DVT -  - Stop Xarelto and hold all anticoagulation due to hematoma above  - SCD's  - IVC filter placed 1/9  # Atrial Fibrillation - Pt had Atrial fibrillation with RVR during a recent hospitalization  - Continue tele - Difficult situation.  Likely needs to have antiplatelet treatment if okay with neurology.   # Seizures -  - no recent seizure activity  - continue Keppra 500mg  bid    # HTN - Patient reports normal values with maximum being 130's/80's at home  - Given normal values PRES seems less likely. - continue labetolol 200mg  bid  - Norvasc 10mg    # Diabetes - Patient has had good control of his sugars at home with 80's-90's routinely  - discontinue home oral medications and avoid metformin for 72 hours after angiogram  - SSI while inpatient  - atorvastatin 20mg  daily   # Hyperlipidemia -  - Continue home dose of Crestor   FEN/GI: Heart healthy diet, NPO until after LP Prophylaxis: SCD's   Disposition: Plan to D/ctoday  Pending Neuro recommendations  Subjective: Feels well, ready to go home.  Objective:  Filed Vitals:   02/08/13 1032  BP: 136/84  Pulse: 78  Temp:   Resp: 20    PHYSICAL EXAM: GENERAL: Adult AA  male. In no discomfort; no respiratory distress  PSYCH: alert and appropriate, good insight   HNEENT: EOMi, no nystagmus  CARDIO: RRR,, No murmur  LUNGS: CTAB, non labored  ABDOMEN: Soft nontender  EXTREM:  Warm, well perfused.  2+ DP pulses  GU:   SKIN:   Neuro: Strength 5/5 and sensation intact in all 4 extremities, alert and  oriented, no gross deficits  Labs and Imaging:   02/05/2013 13:26  Homocysteine 10.5  Sed Rate 15  Anticardiolipin IgA 10 (L)  Anticardiolipin IgG 9 (L)  Anticardiolipin IgM 3 (L)  PTT Lupus Anticoagulant 35.5  PTTLA Confirmation NOT APPL  PTTLA 4:1 Mix NOT APPL  DRVVT 32.4  Drvvt confirmation NOT APPL  dRVVT Incubated 1:1 Mix NOT APPL  Lupus Anticoagulant NOT  DETECTED  Beta-2 Glyco I IgG 7  Beta-2-Glycoprotein I IgA 4  Beta-2-Glycoprotein I IgM 18  AntiThromb III Func 95  Recommendations-PTGENE: (NOTE)  Protein C Activity 198 (H)  Protein C, Total 110  Protein S Activity 101  Protein S Ag, Total 74  ANA NEGATIVE  Angiotensin-Converting Enzyme 19  C3 Complement 128  Complement C4, Body Fluid 31  RPR NON REACTIVE  HIV NON REACTIVE   EXAM:  MRI HEAD WITHOUT CONTRAST 02/04/13  IMPRESSION:  1. Possible punctate area of restricted diffusion in the anterior  right frontal lobe subcortical white matter such as due to acute  lacunar infarct. However, this might also be imaging artifact  related to nearby chronic blood products.  2. No acute mass effect or new intracranial hemorrhage. Stable left  temporal lobe hemorrhage. Advanced chronic underlying mostly micro  hemorrhages in the brain elsewhere.  3. 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  4. Top differential considerations to consider in addition to PRES  include an underlying coagulopathy (such as protein C deficiency or  other hypercoagulable state) and chronic vasculitis such as due to  connective tissue disorder.    02/07/13 - IR Brain Angiogram: Mild non-specific segmental narrowing of distal Lt pericallosal branches.  Venous drainage WNL  Timmothy Euler, MD [facility] Resident - PGY-2 02/08/2013 11:54 AM

## 2013-02-08 NOTE — Discharge Summary (Signed)
FMTS Attending  Note: Juan Morell,MD I  have seen and examined this patient, reviewed their chart. I have discussed this patient with the resident. I agree with the resident's findings, assessment and care plan.  

## 2013-02-08 NOTE — Discharge Summary (Signed)
[facility] Discharge Summary  Patient name: Juan Glenn Medical record number: [id number] Date of birth: [birth year] Age: 58 y.o. Gender: male Date of Admission: 02/04/2013  Date of Discharge: 02/08/2013 Admitting Physician: Andrena Mews, MD  Primary Care Provider: RECORD,CHARLES Consultants: Neurology  Indication for Hospitalization: Intracranial hemtoma  Discharge Diagnoses/Problem List:  1. intracranial hematoma 2. History of pulmonary embolism and DVT 3. atrial fibrillation 4. seizure disorder 5. Hypertension 6. diabetes mellitus type 2 7. hyperlipidemia  Disposition: Home  Discharge Condition: Stable  Discharge Exam:  GENERAL:  Adult AA male. In no discomfort; no respiratory distress   PSYCH:  alert and appropriate, good insight   HNEENT:  EOMi, no nystagmus   CARDIO:  RRR,, No murmur   LUNGS:  CTAB, non labored   ABDOMEN:  Soft nontender   EXTREM:  Warm, well perfused. 2+ DP pulses   GU:    SKIN:    Neuro: Strength 5/5 and sensation intact in all 4 extremities, alert and oriented, no gross deficits  Brief Hospital Course:  Juan Glenn is a 57 y.o. male presenting with concerns for a new L lobar hematoma detected on outpatient MRI from his neurology office. He had two recent hospitalizations in November 2014 for seizures, aspiration pneumonia, and DVT with pulmonary emboli. Throughout the admission we worked closely with neurology and ultimately stopped his anticoagulation and place a Greenfield filter risk of PE.  # Microhemorrhages, L temporal hematoma, and white matter changes - These changes were detected on MRI 02/04/13 and were compared to several prior MRI images by inpatient neuro.  They were concerned that this is not a presentation consistent with PRES as was previously discussed. Patient's MRI findings do not seem to be reversing despite optimal blood pressure control. A complete hypercoagulable workup is taken up  on lab results thus far normal. A Cerebral angiogram performed 1/9 had no impressive findings.  An LP on 1/9 was performed and we cell count and protein/glucose was not indicative of infectious etiology, other labs pending as detailed below. .  # PE/DVT -  His Xarelto due to the above described hematoma. An IVC filter was placed on 1/9.  # Atrial Fibrillation -  Pt had Atrial fibrillation with RVR during a recent hospitalization, anticoagulation was discontinued and aspirin was held during hospitalization but restarted on discharge after discussion with neurology.  # Seizures -  He had no seizure activity and was continued on Keppra 500 mg BID  # HTN  Well-controlled on labetolol 200mg  bid and Norvasc 10mg    # Diabetes  Treated with sliding scale insulin while admitted and restarted home meds on this charge.  # Hyperlipidemia -  Treated with Vytorin patient and restarted home dose of Crestor on discharge.  Issues for Follow Up:  - Followup labs drawn by neurology - Ensure continued hypertension control  Significant Procedures:  1/8: IVC filter placed 1/9: Lumbar puncture performed  Significant Labs and Imaging:   Recent Labs Lab 02/04/13 1136  WBC 8.2  HGB 13.5  HCT 39.1  PLT 213    Recent Labs Lab 02/04/13 1136  NA 140  K 4.5  CL 101  CO2 24  GLUCOSE 75  BUN 9  CREATININE 1.03  CALCIUM 10.0   Results for Juan Glenn, Juan Glenn (MRN [id number]) as of 02/08/2013 13:54  Ref. Range 02/05/2013 13:26  Sed Rate Latest Range: 0-16 mm/hr 15  Anticardiolipin IgA Latest Range: <22 APL U/mL 10 (L)  Anticardiolipin IgG Latest Range: <23 GPL U/mL 9 (  L)  Anticardiolipin IgM Latest Range: <11 MPL U/mL 3 (L)  PTT Lupus Anticoagulant Latest Range: 28.0-43.0 secs 35.5  PTTLA Confirmation Latest Range: <8.0 secs NOT APPL  PTTLA 4:1 Mix Latest Range: 28.0-43.0 secs Less than reference range  DRVVT Latest Range: <42.9 secs 32.4  Drvvt confirmation Latest Range: <1.11 Ratio Less  than reference range  dRVVT Incubated 1:1 Mix Latest Range: <42.9 secs Less than reference range  Lupus Anticoagulant Latest Range: NOT DETECTED  NOT DETECTED  Beta-2 Glyco I IgG Latest Range: <20 G Units 7  Beta-2-Glycoprotein I IgA Latest Range: <20 A Units 4  Beta-2-Glycoprotein I IgM Latest Range: <20 M Units 18  AntiThromb III Func Latest Range: 75-120 % 95  Recommendations-F5LEID: No range found No mutation  Recommendations-PTGENE: No range found No mutation  Protein C Activity Latest Range: 75-133 % 198 (H)  Protein C, Total Latest Range: 72-160 % 110  Protein S Activity Latest Range: 69-129 % 101  Protein S Ag, Total Latest Range: 60-150 % 74   MRI HEAD WITHOUT CONTRAST 02/04/13  IMPRESSION:  1. Possible punctate area of restricted diffusion in the anterior  right frontal lobe subcortical white matter such as due to acute  lacunar infarct. However, this might also be imaging artifact  related to nearby chronic blood products.  2. No acute mass effect or new intracranial hemorrhage. Stable left  temporal lobe hemorrhage. Advanced chronic underlying mostly micro  hemorrhages in the brain elsewhere.  3. 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  4. Top differential considerations to consider in addition to PRES  include an underlying coagulopathy (such as protein C deficiency or  other hypercoagulable state) and chronic vasculitis such as due to  connective tissue disorder.    02/07/13 - IR Brain Angiogram: Mild non-specific segmental narrowing of distal Lt pericallosal branches. Venous drainage WNL  Results/Tests Pending at Time of Discharge: CSF IgG index, oligoclonal bands,  Discharge Medications:    Medication List    STOP taking these medications       Rivaroxaban 20 MG Tabs tablet  Commonly known as:  XARELTO      TAKE these  medications       amLODipine 10 MG tablet  Commonly known as:  NORVASC  Take 1 tablet (10 mg total) by mouth daily.     aspirin EC 81 MG tablet  Take 1 tablet (81 mg total) by mouth daily.     glimepiride 2 MG tablet  Commonly known as:  AMARYL  Take 1 tablet (2 mg total) by mouth 2 (two) times daily with a meal. Breakfast and dinner     labetalol 200 MG tablet  Commonly known as:  NORMODYNE  Take 1 tablet (200 mg total) by mouth 2 (two) times daily.     levETIRAcetam 500 MG tablet  Commonly known as:  KEPPRA  Take 1 tablet (500 mg total) by mouth 2 (two) times daily.     Linagliptin-Metformin HCl 2.05-998 MG Tabs  Commonly known as:  JENTADUETO  Take 1 tablet by mouth daily.     loratadine 10 MG tablet  Commonly known as:  CLARITIN  Take 10 mg by mouth daily as needed for allergies.     multivitamin with minerals Tabs tablet  Take 1 tablet by mouth daily.     pantoprazole 40 MG tablet  Commonly known as:  PROTONIX  Take  1 tablet (40 mg total) by mouth daily.     rosuvastatin 10 MG tablet  Commonly known as:  CRESTOR  Take 10 mg by mouth daily.        Discharge Instructions: Please refer to Patient Instructions section of EMR for full details.  Patient was counseled important signs and symptoms that should prompt return to medical care, changes in medications, dietary instructions, activity restrictions, and follow up appointments.   Follow-Up Appointments:     Follow-up Information   Schedule an appointment as soon as possible for a visit with RECORD,CHARLES.   Specialty:  Family Medicine   Contact information:   [address] [place] [address] [phone]       Schedule an appointment as soon as possible for a visit with Narda Amber, MD.   Specialty:  Neurology   Contact information:   [place] [phone]       Timmothy Euler, MD 02/08/2013, 1:43 PM PGY-2, [place]

## 2013-02-08 NOTE — Progress Notes (Signed)
FMTS Attending  Note: Tanayah Squitieri,MD I  have seen and examined this patient, reviewed their chart. I have discussed this patient with the resident. I agree with the resident's findings, assessment and care plan.  

## 2013-02-08 NOTE — Progress Notes (Signed)
Discharged home with his wife; discharge instructions reviewed; patient ready for discharge;no new Rxs. Instructed on follow up appointments.  Patient will pull off right groin dressing this evening and leave it open to air. Site unremarkable;no hematoma.

## 2013-02-08 NOTE — Progress Notes (Signed)
Stroke Team Progress Note  HISTORY Juan Glenn is an 58 y.o. male who was admitted 11/19-12/03/2012: Patient presented to emergency room with respiratory failure and found to have bilateral multifocal pulmonary emboli and right sided DVT. He was intubated and on 11/19 and extubated 11/23. His hospital course was notable for pulmonary edema, hypernatremia, and respiratory insufficiency. He was started on Xeralto for his PE and DVT. Workup while in hospital included EEG, MRI brain wwo contrast, CTA, echo, and laboratory resting. EEG was normal and MRI brain which showed abnormal patchy areas of T2 signal intensity involving bilateral temporal lobes, occipital lobes, parietal lobes, and left frontal lobe. Some of these areas demonstrate mild enhancement. There is minimal petechial hemorrhage in the left frontal lobe and right parietal lobe. His CTA showed mild intracranial atherosclerosis. No evidence for embolic source on echocardiogram. Per neurology note, " Imaging was performed on 01/29/2013 that showed several areas of white matter changes which had progressed since his previous MRI of brain in September 2014. In addition, there is a new left lobar subacute hematoma, which was not previously seen on his CT head dated 11/6. Imaging was reviewed personally with radiology and changes are thought to be most consistent with PRES. Subacute hematoma may have bee due elevated blood pressure and that he is on anticoagulation for PE/DVT. " Dr Posey Pronto (neurology ) discussed the above findings with the patient and his primary care physician, Dr. Record, because I do not feel that he is safe taking Xeralto with known subacute intracranial hematoma since it cannot be reversed. Dr. Record would be managing his anticoagulation and would like the patient admitted for close observation as he is transitioned off Xeralto and to coumadin (keeping a narrow INR goal), which I think is reasonable. Patient was brought to ED and  neurology was asked to see patient, follow patient, and give further recommendations while in hospital.   Ischemic stroke was an incidental finding. Patient was not considered for TPA secondary to asymptomatic findings, currently on xarelto. He was admitted for further evaluation and treatment.  SUBJECTIVE  Stable. No complaints.had spinal tap y`day  OBJECTIVE Most recent Vital Signs: Filed Vitals:   02/07/13 2138 02/07/13 2155 02/08/13 0120 02/08/13 0531  BP: 130/78 120/76 114/74 119/80  Pulse:  79 78 78  Temp:  98.4 F (36.9 C) 98.5 F (36.9 C) 97.9 F (36.6 C)  TempSrc:  Oral Oral Oral  Resp:  26 20 26   Height:      Weight:      SpO2:  96% 95% 96%   CBG (last 3)   Recent Labs  02/07/13 2206 02/08/13 0528 02/08/13 0637  GLUCAP 175* 129* 120*    IV Fluid Intake:   . sodium chloride Stopped (02/05/13 0844)    MEDICATIONS  . amLODipine  10 mg Oral Daily  . atorvastatin  20 mg Oral q1800  . insulin aspart  0-15 Units Subcutaneous TID WC  . labetalol  200 mg Oral BID  . levETIRAcetam  500 mg Oral BID  . pantoprazole  40 mg Oral Daily   PRN:  loratadine  Diet:  Carb Control Activity:  As tolerated DVT Prophylaxis:  SCDs   CLINICALLY SIGNIFICANT STUDIES Basic Metabolic Panel:   Recent Labs Lab 02/04/13 1136  NA 140  K 4.5  CL 101  CO2 24  GLUCOSE 75  BUN 9  CREATININE 1.03  CALCIUM 10.0   Liver Function Tests: No results found for this basename: AST, ALT, ALKPHOS, BILITOT, PROT, ALBUMIN,  in the last 168 hours CBC:   Recent Labs Lab 02/04/13 1136  WBC 8.2  NEUTROABS 4.1  HGB 13.5  HCT 39.1  MCV 85.9  PLT 213   Coagulation:   Recent Labs Lab 02/04/13 1136 02/05/13 0455  LABPROT 15.2 13.6  INR 1.23 1.06   Cardiac Enzymes: No results found for this basename: CKTOTAL, CKMB, CKMBINDEX, TROPONINI,  in the last 168 hours Urinalysis: No results found for this basename: COLORURINE, APPERANCEUR, LABSPEC, PHURINE, GLUCOSEU, HGBUR, BILIRUBINUR,  KETONESUR, PROTEINUR, UROBILINOGEN, NITRITE, LEUKOCYTESUR,  in the last 168 hours Lipid Panel    Component Value Date/Time   CHOL 110 09/07/2012 0540   TRIG 116 12/21/2012 0500   HDL 43 09/07/2012 0540   CHOLHDL 2.6 09/07/2012 0540   VLDL 22 09/07/2012 0540   LDLCALC 45 09/07/2012 0540   HgbA1C  Lab Results  Component Value Date   HGBA1C 8.0* 12/24/2012    Urine Drug Screen:     Component Value Date/Time   LABOPIA NONE DETECTED 12/05/2012 0041   COCAINSCRNUR NONE DETECTED 12/05/2012 0041   LABBENZ NONE DETECTED 12/05/2012 0041   AMPHETMU NONE DETECTED 12/05/2012 0041   THCU NONE DETECTED 12/05/2012 0041   LABBARB NONE DETECTED 12/05/2012 0041    Alcohol Level: No results found for this basename: ETH,  in the last 168 hours  CT of the brain    MRI of the brain  02/04/2013   1. Possible punctate area of restricted diffusion in the anterior right frontal lobe subcortical white matter such as due to acute lacunar infarct. However, this might also be imaging artifact related to nearby chronic blood products. 2. No acute mass effect or new intracranial hemorrhage. Stable left temporal lobe hemorrhage. Advanced chronic underlying mostly micro hemorrhages in the brain elsewhere. 3. 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 4. Top differential considerations to consider in addition to PRES include an underlying coagulopathy (such as protein C deficiency or other hypercoagulable state) and chronic vasculitis such as due to connective tissue disorder.     CT angio of the brain  09/07/2012 Mild intracranial atherosclerotic type changes  2D Echocardiogram  12/20/2012 EF 55-60% with no source of embolus.  Carotid Doppler    CXR  12/29/2012 1. Significant improvement in bilateral edema or infiltrates with mild residual 2. Stable cardiomegaly.  EKG  12/19/2012 sinus tachycardia.    Lumbar Puncture - performed in radiology 02/08/2012 without immediate complications. Culture pending. No organisms on Gram stain. Protein high at 57. Glucose normal at 59. Cell count rbc's elevated at 350. And WBcs 7 only  4 Vessel Cerebral Angiogram - 02/07/2013 - Dr. Estanislado Pandy - mild non specific segmental narrowing of distal Lt pericallosal branches. Venous grainage WNLs    Therapy Recommendations no therapy needs  Physical Exam   Pleasant middle aged african [place] male not in any distress.Awake alert. Afebrile. Head is nontraumatic. Neck is supple without bruit. Hearing is normal. Cardiac exam no murmur or gallop. Lungs are clear to auscultation. Distal pulses are well felt. Neurological Exam ;  Awake  Alert oriented x 3. Normal speech and language.intact attention, registration and short-term memory. Recall 2/3. eye movements full without nystagmus.fundi were not visualized. Vision acuity and fields appear normal. Hearing is normal. Palatal movements are normal. Face symmetric. Tongue midline. Normal strength, tone, reflexes and coordination. Normal sensation. Gait deferred. ASSESSMENT Juan Glenn is a 58 y.o. male who  has a  complicated history of: Syncope, seizures, DVT, pulmonary embolism, silent left temporal intracerebral hematoma and now a tiny silent right brain subcortical infarct-all within the last 4-5 months  Aug 2014 - syncope,    . MRI showed white matter hyperintensities with a broad differential listed Sept 2014 - Follow up MRI showed improvement in white matter disease - pt dx as PRES.  Nov 2014 - hospitalized with seizures due to hypertensive emergency, dx with new onset afib Nov - Dec 2014 respiratory failure, found to have PE and DVT, placed on xarelto.  Dec 2014 - f/u MRI showed progression or white matter disease with an asymptomatic right occipital and left temporal hemorrhage along with a few microhemorrhages.  Jan 2014 - MRI with increased  microhemorrhages and an incidental small right frontal ischemic infarct.   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 Hypercoagulable labs pending  On xarelto prior to admission. Now on no antithrombotics due to intracerebral hemorrhages.  Has been off xarelto > 48 hours.   LDL 45 Diabetes, HgbA1c 8.0, goal < 7.0 (Nov 2014)  Hospital day # 4  TREATMENT/PLAN  Lumbar puncture as above.  F/u Hypercoagulable panel   Cerebral angiogram 02/07/2013 mild non specific segmental narrowing of distal Lt pericallosal branches. Venous drainage WNLs  IVC filter placed at 02/06/2013  OK for discharge today on ASA 81 mg daily  F/U his regular neurologist and follow results of oligoclonal bands, hypercoagulable panel. Recommend repeat MRI in 2 months.  Dr. Leonie Man discussed diagnosis, prognosis,  treatment options and plan of care with pt, wife and medical team.   Mikey Bussing PA-C [facility] Pager [phone] 02/08/2013, 10:21 AM  I have personally obtained a history, examined the patient, evaluated imaging results, and formulated the assessment and plan of care. I agree with the above. Antony Contras, MD

## 2013-02-08 NOTE — Progress Notes (Signed)
PT Cancellation Note  Patient Details Name: Juan Glenn MRN: [id number] DOB: December 26, 1955   Cancelled Treatment:     Pt d/c without PT recommendations, Patient ambulating for dc home with nurse tech.   Duncan Dull 02/08/2013, 1:06 PM Alben Deeds, [place] DPT  [phone]

## 2013-02-08 NOTE — Discharge Instructions (Signed)
You were admitted for an intracranial bleed. Be sure to stop taking xarelto, which is a blood thinner. Your neurologist to followup with you for all of your lab values are still pending.  You can still take aspirin.   Be sure to followup with your PCP in about one week, also be sure to follow up with your neurologist in one to 2 weeks.  Be sure to seek help right away for severe headache, change in mental status, change in vision, and/or weakness or numbness on one or both sides of the body.

## 2013-02-08 NOTE — Progress Notes (Signed)
[facility]MTS Attending Note: Cady Hafen,MD I  have seen and examined this patient, reviewed their chart. I have discussed this patient with the resident. I agree with the resident's findings, assessment and care plan.  

## 2013-02-11 DIAGNOSIS — Z95828 Presence of other vascular implants and grafts: Secondary | ICD-10-CM | POA: Insufficient documentation

## 2013-02-11 LAB — CSF CULTURE

## 2013-02-11 LAB — CSF CULTURE W GRAM STAIN: Culture: NO GROWTH

## 2013-02-12 LAB — CSF IGG: IgG, CSF: 5 mg/dL (ref 0.8–7.7)

## 2013-02-13 LAB — OLIGOCLONAL BANDS, CSF + SERM

## 2013-02-21 ENCOUNTER — Ambulatory Visit (INDEPENDENT_AMBULATORY_CARE_PROVIDER_SITE_OTHER): Payer: BC Managed Care – PPO | Admitting: Neurology

## 2013-02-21 ENCOUNTER — Encounter: Payer: Self-pay | Admitting: *Deleted

## 2013-02-21 ENCOUNTER — Encounter: Payer: Self-pay | Admitting: Neurology

## 2013-02-21 VITALS — BP 110/70 | HR 68 | Temp 97.4°F | Resp 20 | Ht 69.0 in | Wt 194.5 lb

## 2013-02-21 DIAGNOSIS — R930 Abnormal findings on diagnostic imaging of skull and head, not elsewhere classified: Secondary | ICD-10-CM

## 2013-02-21 DIAGNOSIS — R569 Unspecified convulsions: Secondary | ICD-10-CM

## 2013-02-21 DIAGNOSIS — R9089 Other abnormal findings on diagnostic imaging of central nervous system: Secondary | ICD-10-CM

## 2013-02-21 NOTE — Progress Notes (Signed)
Follow-up Visit   Date: 02/21/2013    Trentin Sanda MRN: [id number] DOB: May 13, 1955   Interim History: Colan Hyppolite is a 58 y.o. right-handed African [place] male with history of hyerptension, diabetes mellitus, hypertension, PRES, situational DVT/PE s/p IVC filter returning to the clinic for follow-up of seizures.  The patient was accompanied to the clinic by wife who also provides collateral information.     History of present illness: He was admitted to [facility] on 8/7-8/9 where he was bought by ambulance after being found confused and unconscious at work. 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 Around 7:30pm, his vision started to get blurry and he felt nauseous so wanted to go to the bathroom. He is required to change into/out of a protective gown to prevent contamination of the electrical parts so was on his way to get his gown off and collapsed inside the gowning room. His co-workers said he acted disoriented before going to the bathroom. He was found unconscious with a bleeding nose by his co-workers who called EMS. He said his tongue was sore at the tip and there was blood. By the time he was transported to [facility], he was back to his baseline, including vision and mental status. He was then transferred to [facility] main campus for further evaluation. Blood pressure on arrival to [facility] ED was 139/80.   His work-up included EEG, MRI brain wwo contrast, CTA, echo, and laboratory resting. EEG was normal and MRI brain which showed abnormal patchy areas of T2 signal intensity involving bilateral temporal lobes, occipital lobes, parietal lobes, and left frontal lobe. Some of these areas demonstrate mild enhancement. Patient remained clinically stable and was discharged with out-patient follow-up. His PCP reduced the dose of his lisinopril to 10mg  which he takes for renal protection because he was concerned this medication may have contributed to his  presentation.   - Follow-up 10/24/2012: Patient doing great, no seizures. Lisinopril stopped due to concern of contributing to PRES. Repeat MRI brain on 10/09/2012 showed improving cortical and subcortical hyperintensities and without enhancement.  Two interim hospitalizations:   (1) 11/6-11/18: Admitted with seizures in the setting of hypertensive emergency required intubation for airway protection. He received Ativan 2 mg x2 and loaded with fosphenytoin. While in the ED, he was noted to have new onset atrial fibrillation with RVR. He is also treated for aspiration pneumonia and discharged on Levaquin. Blood pressure was optimized with labetalol 200 mg twice daily and norvasc 10mg    (2) 11/19-12/03/2012: Patient presented to emergency room with respiratory failure and found to have bilateral multifocal pulmonary emboli and right sided DVT. He was intubated and on 11/19 and extubated 911/23. His hospital course was notable for pulmonary edema, hypernatremia, and respiratory insufficiency. He was started on Xeralto.  - Follow-up 01/21/2013:  Clinically, the patient was doing very well. MRI of brain was ordered given his recent hospitalization for seizures. Imaging was performed on 01/29/2013 that showed several areas of white matter changes which had progressed since his previous MRI of brain in September 2014. In addition, there is a new left lobar subacute hematoma, which was not previously seen on his CT head dated 11/6. Imaging was reviewed personally with radiology and changes are thought to be most consistent with PRES. Subacute hematoma may have bee due elevated blood pressure and that he is on anticoagulation for PE/DVT.    - Follow-up 02/06/2013:  Recently admitted for anticoagulation management and was found  to have subacute intraparenchymal hematoma. During that admission, he had repeat MRI of the brain which showed microhemorrhages so anticoagulation was stopped. He also has cerebral angiogram and  lumbar puncture looking for CNS vasculitis. Results were nonrevealing.  Alen Blew was stopped and he had IVC filter placement. He has been well since his discharge. No further seizures.    Medications:  Current Outpatient Prescriptions on File Prior to Visit  Medication Sig Dispense Refill  . amLODipine (NORVASC) 10 MG tablet Take 1 tablet (10 mg total) by mouth daily.  30 tablet    . aspirin EC 81 MG tablet Take 1 tablet (81 mg total) by mouth daily.      Marland Kitchen glimepiride (AMARYL) 2 MG tablet Take 1 tablet (2 mg total) by mouth 2 (two) times daily with a meal. Breakfast and dinner  60 tablet  3  . labetalol (NORMODYNE) 200 MG tablet Take 1 tablet (200 mg total) by mouth 2 (two) times daily.  60 tablet  3  . levETIRAcetam (KEPPRA) 500 MG tablet Take 1 tablet (500 mg total) by mouth 2 (two) times daily.  60 tablet  6  . Linagliptin-Metformin HCl (JENTADUETO) 2.05-998 MG TABS Take 1 tablet by mouth daily.  30 tablet  3  . loratadine (CLARITIN) 10 MG tablet Take 10 mg by mouth daily as needed for allergies.      . Multiple Vitamin (MULTIVITAMIN WITH MINERALS) TABS tablet Take 1 tablet by mouth daily.      . pantoprazole (PROTONIX) 40 MG tablet Take 1 tablet (40 mg total) by mouth daily.  30 tablet  3  . rosuvastatin (CRESTOR) 10 MG tablet Take 10 mg by mouth daily.       No current facility-administered medications on file prior to visit.    Allergies:  Allergies  Allergen Reactions  . Lisinopril     MD thinks that this medication caused patient to "pass out" "faint".      Review of Systems:  CONSTITUTIONAL: No fevers, chills, night sweats, or weight loss.   EYES: No visual changes or eye pain ENT: No hearing changes.  No history of nose bleeds.   RESPIRATORY: No cough, wheezing and shortness of breath.   CARDIOVASCULAR: Negative for chest pain, and palpitations.   GI: Negative for abdominal discomfort, blood in stools or black stools.  No recent change in bowel habits.   GU:  No history  of incontinence.   MUSCLOSKELETAL: No history of joint pain or swelling.  No myalgias.   SKIN: Negative for lesions, rash, and itching.   ENDOCRINE: Negative for cold or heat intolerance, polydipsia or goiter.   PSYCH:  No depression or anxiety symptoms.   NEURO: As Above.   Vital Signs:  BP 110/70  Pulse 68  Temp(Src) 97.4 F (36.3 C)  Resp 20  Ht 5\' 9"  (1.753 m)  Wt 179 lb 6.4 oz (81.375 kg)  BMI 26.48 kg/m2  Neurological Exam: MENTAL STATUS including orientation to time, place, person, recent and remote memory, attention span and concentration, language, and fund of knowledge is normal.  Speech is not dysarthric.  CRANIAL NERVES: No visual field defects. Pupils equal round and reactive to light.  Normal conjugate, extra-ocular eye movements in all directions of gaze.  No ptosis . Normal facial sensation.  Face is symmetric. Palate elevates symmetrically.  Tongue is midline.  MOTOR:  Motor strength is 5/5 in all extremities.  No atrophy, fasciculations or abnormal movements.  No pronator drift.  Tone is normal.  MSRs:  Reflexes are 2+/4 throughout, except right patella is 3+/4.  Plantars are down going.  SENSORY:  Intact to vibration and light touch.  COORDINATION/GAIT:  Normal finger-to- nose-finger and heel-to-shin.  Intact rapid alternating movements bilaterally.  Gait narrow based and stable.   Data: MRI brain 10/09/2012:  1. Improving cortical and subcortical T2 hyperintensities this.  2. Previously seen intravascular enhancement is no longer present.  3. This most likely represents resolving posterior reversible encephalopathy or H D E. No definite encephalomalacia is evident to confirm previous infarcts.  4. 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  MRI brain 01/29/2013: Progressed peripheral supratentorial gray-white matter FLAIR abnormality's, including left frontal lesion which shows faint  enhancement. In addition, new left  posterior temporal 15 x 14 mm he subacute hematoma. Additional supratentorial micro hemorrhages. Constellation of findings may reflect chronic severe hypertension, with subsequent lobar hematoma. Scattered micro hemorrhages can also be seen with amyloid angiopathy. The FLAIR abnormality's may reflect superimposed PRES. Overall progression of disease process. No atrophy.  MRI brain 02/04/2013:  1. Possible punctate area of restricted diffusion in the anterior right frontal lobe subcortical white matter such as due to acute lacunar infarct. However, this might also be imaging artifact related to nearby chronic blood products.  2. No acute mass effect or new intracranial hemorrhage. Stable left temporal lobe hemorrhage. Advanced chronic underlying mostly micro hemorrhages in the brain elsewhere.  3. 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  4. Top differential considerations to consider in addition to PRES include an underlying coagulopathy (such as protein C deficiency or other hypercoagulable state) and chronic vasculitis such as due to connective tissue disorder.  Cerebral angiogram 02/10/2013: Mild focal areas of segmental caliber narrowing of the distal left pericallosal branches. These findings are nonspecific. Differential considerations would changes secondary to intracranial arteriosclerosis, vasospasm or vasculitis. Clinical correlation is recommended.  CSF 02/07/2013:  R21 W1 G59 P57,   IgG index 5.0, OCB neg Component     Latest Ref Rng 02/05/2013  Sed Rate     0 - 16 mm/hr 15  RPR     NON REACTIVE NON REACTIVE  ANA     NEGATIVE NEGATIVE  Compl, Total (CH50)     31 - 60 U/mL >60 (H)  Complement C4, Body Fluid     10 - 40 mg/dL 31  C3 Complement     90 - 180 mg/dL 128  Hypercoagulable work-up:  antithrombin III, protein C, protein S, lupus anticoagulant,  homocystine, factor 5 Leiden - wnl HIV, ACE - neg   IMPRESSION: 1. Seizure in the setting of abnormal brain MRI   - First time sz on 09/05/2012 due to PRES. Underlying etiology remains uncertain especially since his BP was not elevated and there was no other associated risk factors, but there have been some cases of PRES with lisinopril, although this would be my first case. Subsequent MRI in September 2014 showed white matter changes involving the parietal and occipital lobes, with milder changes in the frontal and temporal regions, but overall they are improved and there is no longer enhancement of these lesions.   -  Second GTC on 11/6 with clustering in the setting of hypertensive emergency. Required intubation. Although this may have been a provoked seizure (elevated BP), the fact that it was prolonged and we still are not clear why he had his  first seizure, keppra 500mg  BID was started.    - MRI brain showed showed several areas of white matter changes which had progressed since his previous MRI of brain in September 2014. In addition, there is a new left lobar subacute hematoma, which was not previously seen on his CT head dated 11/6.  Because he was taking Xeralto for DVT/PE, patient was admitted for anticoagulation management.  During the admission, he underwent cerebral angriogram, LP, and IVF filter placement.  Work-up did not reveal vasculitis or vasculopathy.  - Today, he is clinically doing well.  I will repeat MRI brain in 27-month to see if there is any resolution of WM changes.  I am still unsure where changes on MRI represent second episode of PRES, however, other infectious, metabolic, autoimmune and inflammatory causes have been excluded  2.  Recent PE and DVT (situational)  S/p IVC filter  - Anticoagulation contraindicated in the setting of recent intraparenchymal hematoma 4. Recent atrial fibrillation with RVR   - currently in NSR  5. Hypertension   - on labetalol 200mg  BID and  norvasc 10mg  daily  7. Diabetes    PLAN/RECOMMENDATIONS:  1.  Continue Keppra 500mg  twice dialy 2.  Continue aspirin 81mg  daily and Crestor 10mg   3.  Repeat MRI brain wwo contrast in 3-4 weeks  4.  He is ok to return to work, but I did discuss seizure precautions, including no driving for at least 22-months from last seizure 5.  Return to clinic 82-months   The duration of this appointment visit was 30 minutes of face-to-face time with the patient.  Greater than 50% of this time was spent in counseling, explanation of diagnosis, planning of further management, and coordination of care.   Thank you for allowing me to participate in patient's care.  If I can answer any additional questions, I would be pleased to do so.    Sincerely,    Jensen Kilburg K. Posey Pronto, DO

## 2013-02-21 NOTE — Patient Instructions (Addendum)
1.  Continue Keppra 500mg  twice dialy 2.  Continue aspirin 81mg  daily and Crestor 10mg   3.  Repeat MRI brain wwo contrast in 3-4 weeks March 10 @11  am to arrive 15 min. Zacarias Pontes [phone] 4.  No driving for at least 52-months from last seizure 5.  Return to clinic 80-months  [place] person with seizures may want to shower instead of bathe to avoid accidental drowning. If falls occur during the patient's typical seizure, a person should use a shower seat, preferably one with a safety strap.    Use nonskid strips in your shower or tub.    Never use electrical equipment near water. This prevents accidental electrocution.    Consider changing glass in shower doors to shatterproof glass.   Risk analyst   If possible, cook when someone else is nearby.    Use the back burners of the stove to prevent accidental burns.    Use shatterproof containers as much as possible. For instance, sauces can be transferred from glass bottles to plastic containers for use.    Limit time that is required using knives or other sharp objects. If possible, buy foods that are already cut, or ask someone to help in meal preparation.   General Safety at [place] not smoke or light fires in the fireplace unless someone else is present.    Do not use space heaters that can be accidentally overturned.    When alone, avoid using step stools or ladders, and do not clean rooftop gutters.    Purchase power tools and motorized Company secretary which have a safety switch that will stop the machine if you release the handle (a 'dead man's' switch).   Driving and Transportation   Avoid driving unless your seizures are well controlled and/or you have permission to drive from your state's Department of Motor Vehicles  [facility]). Each state has different laws. Please refer to the following link on the [place] website for more information:  http://www.epilepsyfoundation.org/answerplace/Social/driving/drivingu.cfm    If you ride a bicycle, wear a helmet and any other necessary protective gear.    When taking public transportation like the bus or subway, stay clear of the platform edge.   Outdoor Careers information officer is okay, but does present certain risks. Never swim alone, and tell friends what to do if you have a seizure while swimming.    Wear appropriate protective equipment.    Ski with a friend. If a seizure occurs, your friend can seek help, if needed. He or she can also help to get you out of the cold. Consider using a safety hook or belt while riding the ski lift.   Safety Issues for Parents   Feed, nurse, dress, and change your infant while sitting on the floor, in a well-protected area.    Childproof your house as much as possible. If you are home alone with your child, consider using a safe play area or playpen. Use child safety gates to prevent a child from falling down stairs or to prevent your child from wandering in the event that you have a seizure.    As your child grows, explain what seizures are in terms that he or she can understand. Some people perform 'seizure drills.' Many people teach their children how to call 911 in an emergency.

## 2013-02-25 ENCOUNTER — Telehealth: Payer: Self-pay | Admitting: Neurology

## 2013-02-25 NOTE — Telephone Encounter (Signed)
Received call from the patient regarding his better to be able to return to work. I informed him that I signed the letter on Friday. I will ask my medical assistant to look into this and have it re-sent, if not already done.  I apologized to the patient for the inconvenience.  Donika K. Posey Pronto, DO

## 2013-02-26 NOTE — Telephone Encounter (Signed)
Spoke with [employer] in human resources. She gave me a new number to refax stated she has been having problems with the first fax number.

## 2013-04-08 ENCOUNTER — Ambulatory Visit (HOSPITAL_COMMUNITY): Payer: BC Managed Care – PPO

## 2013-04-10 ENCOUNTER — Ambulatory Visit (HOSPITAL_COMMUNITY)
Admission: RE | Admit: 2013-04-10 | Discharge: 2013-04-10 | Disposition: A | Discharge status: Home / Self Care | Payer: BC Managed Care – PPO | Source: Ambulatory Visit | Attending: Neurology | Admitting: Neurology

## 2013-04-10 DIAGNOSIS — R569 Unspecified convulsions: Secondary | ICD-10-CM | POA: Insufficient documentation

## 2013-04-10 DIAGNOSIS — R9089 Other abnormal findings on diagnostic imaging of central nervous system: Secondary | ICD-10-CM

## 2013-04-10 LAB — CREATININE, SERUM
Creatinine, Ser: 1.05 mg/dL (ref 0.50–1.35)
GFR calc Af Amer: 89 mL/min — ABNORMAL LOW (ref >90)
GFR calc non Af Amer: 76 mL/min — ABNORMAL LOW (ref >90)

## 2013-04-10 MED ORDER — GADOBENATE DIMEGLUMINE 529 MG/ML IV SOLN
20.0000 mL | Freq: Once | INTRAVENOUS | Status: AC | PRN
  Administered 2013-04-10: 18 mL via INTRAVENOUS

## 2013-04-11 ENCOUNTER — Telehealth: Payer: Self-pay | Admitting: Neurology

## 2013-04-11 NOTE — Telephone Encounter (Signed)
Called patient to discuss results of MRI brain. I discussed that there are evolving T2 lesions, some new, some which have increased and other which have decreased in size.   There is no enhancement or new infarcts.  Etiology still remains unclear.  He reports no new neurological symptoms.  Prior testing has included CSF analysis and angiogram which was unrevealing.  I will see him back on 3/23 to go over images and reassess him.  Stephanee Barcomb K. Posey Pronto, DO

## 2013-04-21 ENCOUNTER — Ambulatory Visit (INDEPENDENT_AMBULATORY_CARE_PROVIDER_SITE_OTHER): Payer: BC Managed Care – PPO | Admitting: Neurology

## 2013-04-21 ENCOUNTER — Other Ambulatory Visit: Payer: Self-pay | Admitting: Neurology

## 2013-04-21 ENCOUNTER — Encounter: Payer: Self-pay | Admitting: Neurology

## 2013-04-21 VITALS — BP 124/74 | HR 86 | Wt 202.3 lb

## 2013-04-21 DIAGNOSIS — R9089 Other abnormal findings on diagnostic imaging of central nervous system: Secondary | ICD-10-CM

## 2013-04-21 DIAGNOSIS — R930 Abnormal findings on diagnostic imaging of skull and head, not elsewhere classified: Secondary | ICD-10-CM

## 2013-04-21 DIAGNOSIS — G379 Demyelinating disease of central nervous system, unspecified: Secondary | ICD-10-CM

## 2013-04-21 LAB — SEDIMENTATION RATE: Sed Rate: 1 mm/hr (ref 0–16)

## 2013-04-21 LAB — C-REACTIVE PROTEIN

## 2013-04-21 NOTE — Progress Notes (Signed)
Follow-up Visit   Date: 04/21/2013    Juan Glenn MRN: [id number] DOB: 06-18-1955   Interim History: Juan Glenn is a 58 y.o. right-handed African [place] male with history of hyerptension, diabetes mellitus, hypertension, ?PRES, situational DVT/PE s/p IVC filter returning to the clinic for follow-up of seizures and abnormal MRI brain.  The patient was accompanied to the clinic by wife who also provides collateral information.     History of present illness: He was admitted to [facility] on 8/7-8/9 where he was bought by ambulance after being found confused and unconscious at work. 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 Around 7:30pm, his vision started to get blurry and he felt nauseous so wanted to go to the bathroom. He is required to change into/out of a protective gown to prevent contamination of the electrical parts so was on his way to get his gown off and collapsed inside the gowning room. His co-workers said he acted disoriented before going to the bathroom. He was found unconscious with a bleeding nose by his co-workers who called EMS. He said his tongue was sore at the tip and there was blood. By the time he was transported to [facility], he was back to his baseline, including vision and mental status. He was then transferred to [facility] main campus for further evaluation. Blood pressure on arrival to [facility] ED was 139/80.   His work-up included EEG, MRI brain wwo contrast, CTA, echo, and laboratory resting. EEG was normal and MRI brain which showed abnormal patchy areas of T2 signal intensity involving bilateral temporal lobes, occipital lobes, parietal lobes, and left frontal lobe. Some of these areas demonstrate mild enhancement. Patient remained clinically stable and was discharged with out-patient follow-up. His PCP reduced the dose of his lisinopril to $RemoveBefor'10mg'OSiswdmrYyzT$  which he takes for renal protection because he was concerned this medication may  have contributed to his presentation.   - Follow-up 10/24/2012: Patient doing great, no seizures. Lisinopril stopped due to concern of contributing to PRES. Repeat MRI brain on 10/09/2012 showed improving cortical and subcortical hyperintensities and without enhancement.  Two interim hospitalizations:   (1) 11/6-11/18: Admitted with seizures in the setting of hypertensive emergency required intubation for airway protection. He received Ativan 2 mg x2 and loaded with fosphenytoin. While in the ED, he was noted to have new onset atrial fibrillation with RVR. He is also treated for aspiration pneumonia and discharged on Levaquin. Blood pressure was optimized with labetalol 200 mg twice daily and norvasc $RemoveBef'10mg'DhcpkqMJrp$    (2) 11/19-12/03/2012: Patient presented to emergency room with respiratory failure and found to have bilateral multifocal pulmonary emboli and right sided DVT. He was intubated and on 11/19 and extubated 911/23. His hospital course was notable for pulmonary edema, hypernatremia, and respiratory insufficiency. He was started on Xeralto.  - Follow-up 01/21/2013: Clinically, the patient was doing very well. MRI of brain (12/31) was ordered given his recent hospitalization for seizures which showed several areas of white matter changes which had progressed since his previous MRI of brain in September 2014. In addition, there is a new left lobar subacute hematoma, which was not previously seen on his CT head dated 11/6. Imaging was reviewed personally with radiology and changes are thought to be most consistent with PRES. Subacute hematoma may have been due elevated blood pressure and that he is on anticoagulation for PE/DVT.   - Follow-up 02/06/2013: Recently admitted for anticoagulation management and was found to have subacute  intraparenchymal hematoma. During that admission, he had repeat MRI of the brain which showed microhemorrhages so anticoagulation was stopped. He also has cerebral angiogram and lumbar  puncture looking for CNS vasculitis. Results were nonrevealing. Juan Glenn was stopped and he had IVC filter placement.   - Follow-up 04/21/2013:  Clinically, he is doing well without any new complaints or interval seizures.  Interval MRI brain was done in March which shows waxing/waning cortical and subcortical white matter changes in areas that were previously uninvolved.  No new infarcts. Denies any constitutional symptoms.  He eye evaluation last week which was normal.      Medications:  Current Outpatient Prescriptions on File Prior to Visit  Medication Sig Dispense Refill  . amLODipine (NORVASC) 10 MG tablet Take 1 tablet (10 mg total) by mouth daily.  30 tablet    . aspirin EC 81 MG tablet Take 1 tablet (81 mg total) by mouth daily.      Marland Kitchen glimepiride (AMARYL) 2 MG tablet Take 1 tablet (2 mg total) by mouth 2 (two) times daily with a meal. Breakfast and dinner  60 tablet  3  . labetalol (NORMODYNE) 200 MG tablet Take 1 tablet (200 mg total) by mouth 2 (two) times daily.  60 tablet  3  . levETIRAcetam (KEPPRA) 500 MG tablet Take 1 tablet (500 mg total) by mouth 2 (two) times daily.  60 tablet  6  . Linagliptin-Metformin HCl (JENTADUETO) 2.05-998 MG TABS Take 1 tablet by mouth daily.  30 tablet  3  . loratadine (CLARITIN) 10 MG tablet Take 10 mg by mouth daily as needed for allergies.      . Multiple Vitamin (MULTIVITAMIN WITH MINERALS) TABS tablet Take 1 tablet by mouth daily.      . pantoprazole (PROTONIX) 40 MG tablet Take 1 tablet (40 mg total) by mouth daily.  30 tablet  3  . rosuvastatin (CRESTOR) 10 MG tablet Take 10 mg by mouth daily.       No current facility-administered medications on file prior to visit.    Allergies:  Allergies  Allergen Reactions  . Lisinopril     MD thinks that this medication caused patient to "pass out" "faint".      Review of Systems:  CONSTITUTIONAL: No fevers, chills, night sweats, or weight loss.   EYES: No visual changes or eye pain ENT: No  hearing changes.  No history of nose bleeds.   RESPIRATORY: No cough, wheezing and shortness of breath.   CARDIOVASCULAR: Negative for chest pain, and palpitations.   GI: Negative for abdominal discomfort, blood in stools or black stools.  No recent change in bowel habits.   GU:  No history of incontinence.   MUSCLOSKELETAL: No history of joint pain or swelling.  No myalgias.   SKIN: Negative for lesions, rash, and itching.   ENDOCRINE: Negative for cold or heat intolerance, polydipsia or goiter.   PSYCH:  No depression or anxiety symptoms.   NEURO: As Above.   Vital Signs:  BP 124/74  Pulse 86  Wt 202 lb 5 oz (91.768 kg)  SpO2 93%  Neurological Exam: MENTAL STATUS including orientation to time, place, person, recent and remote memory, attention span and concentration, language, and fund of knowledge is normal.  Speech is not dysarthric.  CRANIAL NERVES: No visual field defects. Pupils equal round and reactive to light.  Normal conjugate, extra-ocular eye movements in all directions of gaze.  Mild let ptosis. Normal facial sensation.  Face is symmetric. Palate elevates symmetrically.  Tongue is midline. Mild bilateral palmomental reflex bilaterally.  MOTOR:  Motor strength is 5/5 in all extremities.  No atrophy, fasciculations or abnormal movements.  No pronator drift.  Tone is normal.    MSRs:  Reflexes are 2+/4 throughout, except right patella is 3+/4.  Plantars are down going.  SENSORY:  Intact to vibration and light touch.  COORDINATION/GAIT:  Normal finger-to- nose-finger and heel-to-shin.  Intact rapid alternating movements bilaterally.  Gait narrow based and stable.   Data: MRI brain 10/09/2012:  1. Improving cortical and subcortical T2 hyperintensities this.  2. Previously seen intravascular enhancement is no longer present.  3. This most likely represents resolving posterior reversible encephalopathy or H D E. No definite encephalomalacia is evident to confirm previous  infarcts.  4. 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  MRI brain 01/29/2013: Progressed peripheral supratentorial gray-white matter FLAIR abnormality's, including left frontal lesion which shows faint  enhancement. In addition, new left posterior temporal 15 x 14 mm he subacute hematoma. Additional supratentorial micro hemorrhages. Constellation of findings may reflect chronic severe hypertension, with subsequent lobar hematoma. Scattered micro hemorrhages can also be seen with amyloid angiopathy. The FLAIR abnormality's may reflect superimposed PRES. Overall progression of disease process. No atrophy.  MRI brain 02/04/2013:  1. Possible punctate area of restricted diffusion in the anterior right frontal lobe subcortical white matter such as due to acute lacunar infarct. However, this might also be imaging artifact related to nearby chronic blood products.  2. No acute mass effect or new intracranial hemorrhage. Stable left temporal lobe hemorrhage. Advanced chronic underlying mostly micro hemorrhages in the brain elsewhere.  3. 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  4. Top differential considerations to consider in addition to PRES include an underlying coagulopathy (such as protein C deficiency or other hypercoagulable state) and chronic vasculitis such as due to connective tissue disorder.  MRI brain 04/10/2013: FINDINGS:  There is no evidence of acute infarct. Focus of remote hemorrhage is again seen in the right occipital lobe. Focus of posterior left temporal lobe hemorrhage has decreased in size with decreased surrounding edema. Additionally, multiple foci of remote microhemorrhage are again seen throughout both cerebral hemispheres, grossly stable from the prior MRI allowing for differences in technique.  Focus of  cortical/subcortical T2 hyperintensity in the right frontal lobe has increased from the prior study (series 7, image 17). Cortical/subcortical T2 hyperintensity in the posterior left frontal lobe has slightly decreased (series 7, image 16). There is a new region of T2 hyperintensity in the subcortical right parietal lobe (series 7, image 16). New subcortical T2 hyperintensity is present in the left temporoparietal region (series 7, image 11). There is a new, small focus of cortical T2 hyperintensity in the left frontal lobe (series 7, image 14). Other regions of cortical and subcortical T2 hyperintensity in the frontal lobes have decreased or resolved. Patchy T2 hyperintensities in the periventricular white matter are similar to the prior study. Ventricles and sulci are within normal limits for age. There is no midline shift or extra-axial fluid collection. Dedicated thin section imaging through the temporal lobes demonstrates normal volume and signal of the hippocampi. No definite abnormal enhancement is identified. Major intracranial vascular flow voids are preserved. Orbits are unremarkable. Paranasal sinuses and mastoid air cells are clear.  IMPRESSION:  1. No evidence of acute infarct.  2. Evolving left temporal lobe hemorrhage.  Grossly stable appearance of other bilateral cerebral hemorrhages allowing for differences in technique.  3. Waxing and waning cortical and subcortical T2 hyperintensities as above. Findings could represent PRES, vasculitis, or other inflammatory conditions.    Cerebral angiogram 02/10/2013: Mild focal areas of segmental caliber narrowing of the distal left pericallosal branches. These findings are nonspecific. Differential considerations would changes secondary to intracranial arteriosclerosis, vasospasm or vasculitis. Clinical correlation is recommended.  CSF 02/07/2013:  R21 W1 G59 P57,   IgG index 5.0, OCB neg  Labs 02/05/2013:  ESR 15, RPR nonreactive, ANA neg, total  complement >60*, C4 31, C3 128  Hypercoagulable work-up:  antithrombin III, protein C, protein S, lupus anticoagulant, homocystine, factor 5 Leiden - wnl HIV, ACE - neg   IMPRESSION: 1. 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 Underlying etiology remains uncertain especially since his BP was not elevated and there was no other associated risk factors. Subsequent MRI in September 2014 showed white matter changes involving the parietal and occipital lobes, with milder changes in the frontal and temporal regions, but overall they are improved and there is no longer enhancement of these lesions.   Second GTC on 11/6 with clustering in the setting of hypertensive emergency. Required intubation. Although this may have been a provoked seizure (elevated BP), the fact that it was prolonged and we still are not clear why he had his first seizure, keppra $RemoveBeforeD'500mg'nwXcySpPPLSsyu$  BID was started.   MRI brain in December showed showed several areas of white matter changes which had progressed since his previous MRI of brain in September 2014. In addition, there is a new left lobar subacute hematoma, which was not previously seen on his CT head dated 11/6. Because he was taking Xeralto for DVT/PE, patient was admitted for anticoagulation management. During the admission, he underwent cerebral angriogram, LP, and IVF filter placement. Work-up did not reveal vasculitis or vasculopathy.   Follow-up MRI brain in March shows areas of new white matter changes in previously uninvolved regions as well as some resolution of old lesions.  There is no enhancement.  Images were again reviewed with patient and his wife.  Clinically, he remains well and exam is non-focal, but I am sure what the underlying etiology of these abnormalities.    Work-up thus far has included CSF analysis, serology testing, hypercoagulable evaluation, and cerebral angriogram which has been  unrevealing.  I am less inclined to think this is residual effects of PRES and more concerned about an an ongoing process (?inflammatory/vasculitis, indolent infection, CNS lymphoma, demyelinating). It is interesting that these lesions are waxing/waning without any treatment.   Additional labs as well as repeat lumbar puncture was discussed and patient is agreeable to testing.  I also discussed that if testing is unrevealing, brain biopsy would be most diagnostic. 2. Recent PE and DVT (situational) S/p IVC filter   Anticoagulation contraindicated in the setting of recent intraparenchymal hematoma  4. Recent atrial fibrillation with RVR, now in NSR 5. Hypertension   on labetalol $RemoveBefo'200mg'xZclZSjcovo$  BID and norvasc $RemoveBef'10mg'hyPdfWxsWn$  daily  7. Diabetes mellitus    PLAN/RECOMMENDATIONS:  1.  Check ESR, CRP, SPEP/UPEP with IFE, ENA, p-ANCA, c-ANCA, ACE, celiac panel, hepatitis panel, cryoglobulins, neocomplete paraneoplastic panel 2.  Repeat CSF analysis - routine analysis, cultures, fungal, cryptococcus, lyme, VZV, OCB, MBP, IgG index, ACE, cytology, cytometry 3.  Consider imaging the cervical spine going forward 4.  Return to clinic in 4 weeks  The duration of this appointment visit was 40 minutes of face-to-face time with the patient.  Greater than 50% of this time was spent in counseling, explanation of diagnosis, planning of further management, and coordination of care.   Thank you for allowing me to participate in patient's care.  If I can answer any additional questions, I would be pleased to do so.    Sincerely,    Swayzee Wadley K. Posey Pronto, DO

## 2013-04-21 NOTE — Patient Instructions (Signed)
1.  Check blood work 2.  You will be contact by [facility] labs for the send-out test that I talked to you about 3.  Lumbar puncture 4.  Return to clinic in 4-6 weeks, or sooner as needed

## 2013-04-22 LAB — ENA 9 PANEL
CENTROMERE AB SCREEN: NEGATIVE
ENA SM Ab Ser-aCnc: <1
JO-1 ANTIBODY, IGG: NEGATIVE
RIBOSOMAL P PROTEIN AB: NEGATIVE
SCLERODERMA (SCL-70) (ENA) ANTIBODY, IGG: NEGATIVE
SM/RNP: <1
SSA (Ro) (ENA) Antibody, IgG: <1
SSB (La) (ENA) Antibody, IgG: <1
ds DNA Ab: <1 IU/mL

## 2013-04-22 LAB — RETICULIN ANTIBODIES, IGA W TITER: RETICULIN AB, IGA: NEGATIVE

## 2013-04-22 LAB — GLIADIN ANTIBODIES, SERUM
Gliadin IgA: 6.9 U/mL (ref <20)
Gliadin IgG: 18.1 U/mL (ref <20)

## 2013-04-22 LAB — TISSUE TRANSGLUTAMINASE, IGA: TISSUE TRANSGLUTAMINASE AB, IGA: 3 U/mL (ref <20)

## 2013-04-22 LAB — ANGIOTENSIN CONVERTING ENZYME: Angiotensin-Converting Enzyme: 23 U/L (ref 8–52)

## 2013-04-23 LAB — UIFE/LIGHT CHAINS/TP QN, 24-HR UR
Albumin, U: DETECTED
Alpha 1, Urine: DETECTED — AB
Alpha 2, Urine: DETECTED — AB
BETA UR: DETECTED — AB
FREE KAPPA LT CHAINS, UR: 4.8 mg/dL — AB (ref 0.14–2.42)
FREE LAMBDA LT CHAINS, UR: 0.24 mg/dL (ref 0.02–0.67)
Free Kappa/Lambda Ratio: 20 ratio — ABNORMAL HIGH (ref 2.04–10.37)
GAMMA UR: DETECTED — AB
Total Protein, Urine: 29.2 mg/dL

## 2013-04-23 LAB — SPEP & IFE WITH QIG
Albumin ELP: 58.1 % (ref 55.8–66.1)
Alpha-1-Globulin: 4.1 % (ref 2.9–4.9)
Alpha-2-Globulin: 12.6 % — ABNORMAL HIGH (ref 7.1–11.8)
BETA 2: 5.4 % (ref 3.2–6.5)
Beta Globulin: 5.9 % (ref 4.7–7.2)
GAMMA GLOBULIN: 13.9 % (ref 11.1–18.8)
IGG (IMMUNOGLOBIN G), SERUM: 1000 mg/dL (ref 650–1600)
IGM, SERUM: 81 mg/dL (ref 41–251)
IgA: 239 mg/dL (ref 68–379)
Total Protein, Serum Electrophoresis: 7.1 g/dL (ref 6.0–8.3)

## 2013-04-23 NOTE — Addendum Note (Signed)
Addended by: Melissa Noon C on: 04/23/2013 12:00 PM   Modules accepted: Orders

## 2013-04-24 ENCOUNTER — Telehealth: Payer: Self-pay | Admitting: *Deleted

## 2013-04-24 NOTE — Telephone Encounter (Signed)
Called pt to let him know his lumbar puncture is scheduled for April 2 at 9:30.  He will need to arrive at the [facility] short stay floor at 8:00 am.  Will instruct him to be NPO after midnight. LM for pt to call me back.

## 2013-04-25 NOTE — Telephone Encounter (Signed)
Pt called back.  Informed him of appt time and date.  He requested to change it to the 3rd.  Called and r/s for the 3rd.  Instructed him to arrive at 8 am at the short stay center.  Also instructed him to be NPO after midnight.

## 2013-04-26 LAB — CRYOGLOBULIN

## 2013-04-28 LAB — PAN-ANCA
Atypical p-ANCA Screen: NEGATIVE
C-ANCA SCREEN: NEGATIVE
Myeloperoxidase Abs: <1
P-ANCA SCREEN: NEGATIVE

## 2013-04-29 ENCOUNTER — Encounter: Payer: Self-pay | Admitting: *Deleted

## 2013-04-29 ENCOUNTER — Telehealth: Payer: Self-pay | Admitting: Neurology

## 2013-04-29 NOTE — Telephone Encounter (Signed)
LM for pt that labs came back normal.

## 2013-04-29 NOTE — Telephone Encounter (Signed)
Returning your call please call [phone]

## 2013-04-30 ENCOUNTER — Other Ambulatory Visit: Payer: Self-pay | Admitting: Radiology

## 2013-05-01 ENCOUNTER — Ambulatory Visit (HOSPITAL_COMMUNITY): Payer: BC Managed Care – PPO

## 2013-05-02 ENCOUNTER — Ambulatory Visit (HOSPITAL_COMMUNITY)
Admission: RE | Admit: 2013-05-02 | Discharge: 2013-05-02 | Disposition: A | Discharge status: Home / Self Care | Payer: BC Managed Care – PPO | Source: Ambulatory Visit | Attending: Neurology | Admitting: Neurology

## 2013-05-02 DIAGNOSIS — R9089 Other abnormal findings on diagnostic imaging of central nervous system: Secondary | ICD-10-CM

## 2013-05-02 DIAGNOSIS — G379 Demyelinating disease of central nervous system, unspecified: Secondary | ICD-10-CM

## 2013-05-02 DIAGNOSIS — G9340 Encephalopathy, unspecified: Secondary | ICD-10-CM | POA: Insufficient documentation

## 2013-05-02 LAB — CSF CELL COUNT WITH DIFFERENTIAL
Eosinophils, CSF: 0 % (ref 0–1)
RBC COUNT CSF: 12 /mm3 — AB
Segmented Neutrophils-CSF: 0 % (ref 0–6)
TUBE #: 3
WBC, CSF: 0 /mm3 (ref 0–5)

## 2013-05-02 LAB — PROTEIN, CSF: TOTAL PROTEIN, CSF: 48 mg/dL — AB (ref 15–45)

## 2013-05-02 LAB — GLUCOSE, CAPILLARY
Glucose-Capillary: 119 mg/dL — ABNORMAL HIGH (ref 70–99)
Glucose-Capillary: 98 mg/dL (ref 70–99)

## 2013-05-02 LAB — GLUCOSE, CSF: GLUCOSE CSF: 65 mg/dL (ref 43–76)

## 2013-05-02 LAB — GRAM STAIN

## 2013-05-02 LAB — CRYPTOCOCCAL ANTIGEN, CSF: Crypto Ag: NEGATIVE

## 2013-05-02 NOTE — Discharge Instructions (Signed)

## 2013-05-02 NOTE — Procedures (Signed)
.  Procedure: Lumbar Puncture with Fluoroscopic Guidance. Specimen: CSF, to lab Bleeding: minimal. Complications: None immediate. Patient   -Condition: Stable.  -Disposition:  Outpatient, to observation and then to home.  Full Radiology Report to Follow under IMAGING

## 2013-05-04 LAB — VARICELLA-ZOSTER BY PCR: Varicella-Zoster, PCR: NOT DETECTED

## 2013-05-04 LAB — ANGIOTENSIN CONVERTING ENZYME, CSF: ANGIO CONVERT ENZYME: 5 U/L (ref <=15)

## 2013-05-05 LAB — MYELIN BASIC PROTEIN, CSF

## 2013-05-05 LAB — CSF IGG: IgG, CSF: 2.9 mg/dL (ref 0.8–7.7)

## 2013-05-06 LAB — CSF CULTURE W GRAM STAIN: Culture: NO GROWTH

## 2013-05-06 LAB — OLIGOCLONAL BANDS, CSF + SERM

## 2013-05-13 ENCOUNTER — Telehealth: Payer: Self-pay | Admitting: Neurology

## 2013-05-13 LAB — B. BURGDORFI ANTIBODIES, CSF

## 2013-05-13 NOTE — Telephone Encounter (Signed)
I attempted to contact phone via phone today regarding the results of CSF testing, however there was no answer so a message was left for the patient to return my call.   Donika K. Posey Pronto, DO

## 2013-05-30 ENCOUNTER — Ambulatory Visit (INDEPENDENT_AMBULATORY_CARE_PROVIDER_SITE_OTHER): Payer: BC Managed Care – PPO | Admitting: Neurology

## 2013-05-30 ENCOUNTER — Encounter: Payer: Self-pay | Admitting: Neurology

## 2013-05-30 VITALS — BP 120/74 | HR 78 | Wt 209.3 lb

## 2013-05-30 DIAGNOSIS — G9349 Other encephalopathy: Secondary | ICD-10-CM

## 2013-05-30 DIAGNOSIS — G0490 Encephalitis and encephalomyelitis, unspecified: Secondary | ICD-10-CM

## 2013-05-30 DIAGNOSIS — G0491 Myelitis, unspecified: Secondary | ICD-10-CM

## 2013-05-30 DIAGNOSIS — R569 Unspecified convulsions: Secondary | ICD-10-CM

## 2013-05-30 LAB — FUNGUS CULTURE W SMEAR
Fungal Smear: NONE SEEN
Special Requests: 5

## 2013-05-30 MED ORDER — LEVETIRACETAM 500 MG PO TABS: 500.0000 mg | ORAL_TABLET | Freq: Two times a day (BID) | ORAL | Status: DC

## 2013-05-30 NOTE — Patient Instructions (Addendum)
1.  MRI brain wwo contrast in August 2.  MRI cervical spine wwo contrast in August 3.  Refills provided for Keppra 500mg  twice daily 4.  Cleared for driving after May 6th 2015 (21-months from last seizure) 5.  Return to clinic in 80-months

## 2013-05-30 NOTE — Progress Notes (Signed)
Follow-up Visit   Date: 05/30/2013    Thelbert Gartin MRN: [id number] DOB: 10/15/1955   Interim History: Colman Birdwell is a 58 y.o. right-handed African [place] male with history of hyerptension, diabetes mellitus, hypertension, seizure disorder, situational DVT/PE s/p IVC filter returning to the clinic for follow-up of leukoencephalopathy of unknown etiology.  The patient was accompanied to the clinic by wife who also provides collateral information.     History of present illness: He was admitted to [facility] on 8/7-8/9 where he was bought by ambulance after being found confused and unconscious at work. 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 Around 7:30pm, his vision started to get blurry and he felt nauseous so wanted to go to the bathroom. He is required to change into/out of a protective gown to prevent contamination of the electrical parts so was on his way to get his gown off and collapsed inside the gowning room. His co-workers said he acted disoriented before going to the bathroom. He was found unconscious with a bleeding nose by his co-workers who called EMS. He said his tongue was sore at the tip and there was blood. By the time he was transported to [facility], he was back to his baseline, including vision and mental status. He was then transferred to [facility] main campus for further evaluation. Blood pressure on arrival to [facility] ED was 139/80.   His work-up included EEG, MRI brain wwo contrast, CTA, echo, and laboratory resting. EEG was normal and MRI brain which showed abnormal patchy areas of T2 signal intensity involving bilateral temporal lobes, occipital lobes, parietal lobes, and left frontal lobe. Some of these areas demonstrate mild enhancement. Patient remained clinically stable and was discharged with out-patient follow-up. His PCP reduced the dose of his lisinopril to 37m which he takes for renal protection because he was concerned  this medication may have contributed to his presentation.   - Follow-up 10/24/2012: Patient doing great, no seizures. Lisinopril stopped due to concern of contributing to PRES. Repeat MRI brain on 10/09/2012 showed improving cortical and subcortical hyperintensities and without enhancement.  Two interim hospitalizations:   (1) 11/6-11/18: Admitted with seizures in the setting of hypertensive emergency required intubation for airway protection. He received Ativan 2 mg x2 and loaded with fosphenytoin. While in the ED, he was noted to have new onset atrial fibrillation with RVR. He is also treated for aspiration pneumonia and discharged on Levaquin. Blood pressure was optimized with labetalol 200 mg twice daily and norvasc 172m  (2) 11/19-12/03/2012: Patient presented to emergency room with respiratory failure and found to have bilateral multifocal pulmonary emboli and right sided DVT. He was intubated and on 11/19 and extubated 911/23. His hospital course was notable for pulmonary edema, hypernatremia, and respiratory insufficiency. He was started on Xeralto.  - Follow-up 01/21/2013: Clinically, the patient was doing very well. MRI of brain (12/31) was ordered given his recent hospitalization for seizures which showed several areas of white matter changes which had progressed since his previous MRI of brain in September 2014. In addition, there is a new left lobar subacute hematoma, which was not previously seen on his CT head dated 11/6. 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 Subacute hematoma may have been due elevated blood pressure and that he is on anticoagulation for PE/DVT.   - Follow-up 02/06/2013: Recently admitted for anticoagulation management and was found to have subacute intraparenchymal hematoma. During that admission, he had repeat MRI  of the brain which showed microhemorrhages so anticoagulation was stopped. He also has cerebral angiogram and lumbar puncture looking for CNS vasculitis.  Results were nonrevealing. Alen Blew was stopped and he had IVC filter placement.   - Follow-up 04/21/2013:  Clinically, he is doing well. Interval MRI brain was done in March which shows waxing/waning cortical and subcortical white matter changes in areas that were previously uninvolved.  No new infarcts.    - Follow-up 05/30/2013:  At his last visit, due to new MRI lesions, patient underwent second LP and had additional lab testing for possible vasculitis, which returned unrevealing.  He is doing well, with no new neurological complaints.  He remains seizure-free.   Medications:  Current Outpatient Prescriptions on File Prior to Visit  Medication Sig Dispense Refill  . amLODipine (NORVASC) 10 MG tablet Take 1 tablet (10 mg total) by mouth daily.  30 tablet    . aspirin EC 81 MG tablet Take 1 tablet (81 mg total) by mouth daily.      Marland Kitchen glimepiride (AMARYL) 2 MG tablet Take 1 tablet (2 mg total) by mouth 2 (two) times daily with a meal. Breakfast and dinner  60 tablet  3  . labetalol (NORMODYNE) 200 MG tablet Take 1 tablet (200 mg total) by mouth 2 (two) times daily.  60 tablet  3  . Linagliptin-Metformin HCl (JENTADUETO) 2.05-998 MG TABS Take 1 tablet by mouth daily.  30 tablet  3  . Multiple Vitamin (MULTIVITAMIN WITH MINERALS) TABS tablet Take 1 tablet by mouth daily.      . rosuvastatin (CRESTOR) 10 MG tablet Take 10 mg by mouth daily.       No current facility-administered medications on file prior to visit.    Allergies:  Allergies  Allergen Reactions  . Lisinopril Other (See Comments)    MD thinks that this medication caused patient to "pass out" "faint".  (blurred vision per spouse)     Review of Systems:  CONSTITUTIONAL: No fevers, chills, night sweats, or weight loss.   EYES: No visual changes or eye pain ENT: No hearing changes.  No history of nose bleeds.   RESPIRATORY: No cough, wheezing and shortness of breath.   CARDIOVASCULAR: Negative for chest pain, and palpitations.     GI: Negative for abdominal discomfort, blood in stools or black stools.  No recent change in bowel habits.   GU:  No history of incontinence.   MUSCLOSKELETAL: No history of joint pain or swelling.  No myalgias.   SKIN: Negative for lesions, rash, and itching.   ENDOCRINE: Negative for cold or heat intolerance, polydipsia or goiter.   PSYCH:  No depression or anxiety symptoms.   NEURO: As Above.   Vital Signs:  BP 120/74  Pulse 78  Wt 209 lb 5 oz (94.944 kg)  SpO2 95%  Neurological Exam: MENTAL STATUS including orientation to time, place, person, recent and remote memory, attention span and concentration, language, and fund of knowledge is normal.  Speech is not dysarthric.  CRANIAL NERVES: No visual field defects. Pupils equal round and reactive to light.  Normal conjugate, extra-ocular eye movements in all directions of gaze.  Mild let ptosis. Normal facial sensation.  Face is symmetric. Palate elevates symmetrically.  Tongue is midline. Mild bilateral palmomental reflex bilaterally.  MOTOR:  Motor strength is 5/5 in all extremities.  No atrophy, fasciculations or abnormal movements.  No pronator drift.  Tone is normal.    MSRs:  Reflexes are 2+/4 throughout, except right patella is  3+/4.  Plantars are down going.  SENSORY:  Intact to vibration and light touch.  COORDINATION/GAIT:  Normal finger-to- nose-finger.  Intact rapid alternating movements bilaterally.  Gait narrow based and stable.   Data: MRI brain 10/09/2012:  1. Improving cortical and subcortical T2 hyperintensities this.  2. Previously seen intravascular enhancement is no longer present.  3. This most likely represents resolving posterior reversible encephalopathy or H D E. No definite encephalomalacia is evident to confirm previous infarcts.  4. 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  MRI brain 01/29/2013: Progressed peripheral supratentorial gray-white matter FLAIR abnormality's,  including left frontal lesion which shows faint  enhancement. In addition, new left posterior temporal 15 x 14 mm he subacute hematoma. Additional supratentorial micro hemorrhages. Constellation of findings may reflect chronic severe hypertension, with subsequent lobar hematoma. Scattered micro hemorrhages can also be seen with amyloid angiopathy. The FLAIR abnormality's may reflect superimposed PRES. Overall progression of disease process. No atrophy.  MRI brain 02/04/2013:  1. Possible punctate area of restricted diffusion in the anterior right frontal lobe subcortical white matter such as due to acute lacunar infarct. However, this might also be imaging artifact related to nearby chronic blood products.  2. No acute mass effect or new intracranial hemorrhage. Stable left temporal lobe hemorrhage. Advanced chronic underlying mostly micro hemorrhages in the brain elsewhere.  3. 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  4. Top differential considerations to consider in addition to PRES include an underlying coagulopathy (such as protein C deficiency or other hypercoagulable state) and chronic vasculitis such as due to connective tissue disorder.  MRI brain 04/10/2013: FINDINGS:  There is no evidence of acute infarct. Focus of remote hemorrhage is again seen in the right occipital lobe. Focus of posterior left temporal lobe hemorrhage has decreased in size with decreased surrounding edema. Additionally, multiple foci of remote microhemorrhage are again seen throughout both cerebral hemispheres, grossly stable from the prior MRI allowing for differences in technique.  Focus of cortical/subcortical T2 hyperintensity in the right frontal lobe has increased from the prior study (series 7, image 17). Cortical/subcortical T2 hyperintensity in the posterior left frontal lobe has  slightly decreased (series 7, image 16). There is a new region of T2 hyperintensity in the subcortical right parietal lobe (series 7, image 16). New subcortical T2 hyperintensity is present in the left temporoparietal region (series 7, image 11). There is a new, small focus of cortical T2 hyperintensity in the left frontal lobe (series 7, image 14). Other regions of cortical and subcortical T2 hyperintensity in the frontal lobes have decreased or resolved. Patchy T2 hyperintensities in the periventricular white matter are similar to the prior study. Ventricles and sulci are within normal limits for age. There is no midline shift or extra-axial fluid collection. Dedicated thin section imaging through the temporal lobes demonstrates normal volume and signal of the hippocampi. No definite abnormal enhancement is identified. Major intracranial vascular flow voids are preserved. Orbits are unremarkable. Paranasal sinuses and mastoid air cells are clear.  IMPRESSION:  1. No evidence of acute infarct.  2. Evolving left temporal lobe hemorrhage. Grossly stable appearance of other bilateral cerebral hemorrhages allowing for differences in technique.  3. Waxing and waning cortical and subcortical T2 hyperintensities as above. Findings could represent PRES, vasculitis, or other inflammatory conditions.    Cerebral angiogram 02/10/2013: Mild focal areas of segmental caliber narrowing of  the distal left pericallosal branches. These findings are nonspecific. Differential considerations would changes secondary to intracranial arteriosclerosis, vasospasm or vasculitis. Clinical correlation is recommended.  CSF 02/07/2013:  R21 W1 G59 P57,   IgG index 5.0, OCB neg CSF 05/29/2012:  R12  W1  G65 P48  IgG index 2.9 MBP <0.2, OCB neg    Cryptococcal neg, Lyme neg, VZV neg, ACE 5, cytology neg  Labs 02/05/2013:  ESR 15, RPR nonreactive, ANA neg, total complement >60*, C4 31, C3 128  Labs 04/21/2013:  CRP <0.5, ESR 1, SPEP/UPEP  with IFE no M protein, ACE 23, celiac panel neg, ENA neg, cryoglobulin neg, ANCA neg  Hypercoagulable work-up:  antithrombin III, protein C, protein S, lupus anticoagulant, homocystine, factor 5 Leiden - wnl HIV, ACE - neg    IMPRESSION: 1. 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 Underlying etiology remains uncertain especially since his BP was not elevated and there was no other associated risk factors. Subsequent MRI in September 2014 showed white matter changes involving the parietal and occipital lobes, with milder changes in the frontal and temporal regions, but overall they are improved and there is no longer enhancement of these lesions.   Second GTC on 11/6 with clustering in the setting of hypertensive emergency. Required intubation. Although this may have been a provoked seizure (elevated BP), the fact that it was prolonged and we still are not clear why he had his first seizure, keppra 567m BID was started.   MRI brain in December showed showed several areas of white matter changes which had progressed since his previous MRI of brain in September 2014. In addition, there is a new left lobar subacute hematoma, which was not previously seen on his CT head dated 11/6. Because he was taking Xeralto for DVT/PE, patient was admitted for anticoagulation management. During the admission, he underwent cerebral angriogram, LP, and IVF filter placement. Work-up did not reveal vasculitis or vasculopathy.   Follow-up MRI brain in March shows areas of new white matter changes in previously uninvolved regions as well as some resolution of old lesions.  There is no enhancement.   Repeat LP with additional vasculitis labs returned negative  Work-up thus far has included CSF analysis, serology testing, hypercoagulable evaluation, and cerebral angriogram which has been unrevealing.  I am less inclined to think this is residual  effects of PRES and more concerned about an an ongoing process (?vasculitis vs. CNS lymphoma vs amyloid angiopathy vs CADISIL) It is interesting that these lesions are waxing/waning without any treatment.   2. Recent PE and DVT (situational) S/p IVC filter   Anticoagulation contraindicated in the setting of recent intraparenchymal hematoma  4. Recent atrial fibrillation with RVR, now in NSR 5. Hypertension   on labetalol 2049mBID and norvasc 1037maily  7. Diabetes mellitus  Clinically, he remains well and exam is non-focal, but I am sure what the underlying etiology of these abnormalities. At this juncture, I feel that his work-up his extensive.  We discussed doing brain biopsy again, but since he is asymptomatic, he would like to hold off for now, which is reasonable.  I'll continue to follow him closely.  Should he develop any new symptoms, low threshold for brain biopsy.   PLAN/RECOMMENDATIONS:  1.  Awaiting neocomplete paraneoplastic panel 2.  Continue Keppra 500m59mD (refills provided) 3.  He will be 6-mo31-monthm last seizure after May 6th 2015, so I cleared him  for driving after this date 4.  Repeat MRI brain and cervical spine wwo contrast in 60-month 5.  Return to clinic in 447-month   The duration of this appointment visit was 30 minutes of face-to-face time with the patient.  Greater than 50% of this time was spent in counseling, explanation of diagnosis, planning of further management, and coordination of care.   Thank you for allowing me to participate in patient's care.  If I can answer any additional questions, I would be pleased to do so.    Sincerely,    Charonda Hefter K. PaPosey ProntoDO

## 2013-06-02 ENCOUNTER — Other Ambulatory Visit: Payer: Self-pay | Admitting: *Deleted

## 2013-06-02 ENCOUNTER — Encounter: Payer: Self-pay | Admitting: *Deleted

## 2013-06-02 DIAGNOSIS — G40909 Epilepsy, unspecified, not intractable, without status epilepticus: Secondary | ICD-10-CM

## 2013-06-02 NOTE — Progress Notes (Signed)
Note faxed.

## 2013-06-19 ENCOUNTER — Encounter: Payer: Self-pay | Admitting: Neurology

## 2013-09-02 ENCOUNTER — Ambulatory Visit (INDEPENDENT_AMBULATORY_CARE_PROVIDER_SITE_OTHER): Payer: BC Managed Care – PPO | Admitting: Neurology

## 2013-09-02 ENCOUNTER — Encounter: Payer: Self-pay | Admitting: Neurology

## 2013-09-02 VITALS — BP 128/80 | HR 79 | Ht 67.72 in | Wt 216.4 lb

## 2013-09-02 DIAGNOSIS — G0490 Encephalitis and encephalomyelitis, unspecified: Secondary | ICD-10-CM

## 2013-09-02 DIAGNOSIS — G0491 Myelitis, unspecified: Secondary | ICD-10-CM

## 2013-09-02 DIAGNOSIS — G9349 Other encephalopathy: Secondary | ICD-10-CM

## 2013-09-02 DIAGNOSIS — R569 Unspecified convulsions: Secondary | ICD-10-CM

## 2013-09-02 NOTE — Patient Instructions (Addendum)
1.  Reschedule MRI brain wwo contrast to November 2.  Continue Keppra 500mg  twice daily 3.  Return to clinic after imaging (November)

## 2013-09-02 NOTE — Progress Notes (Signed)
Follow-up Visit   Date: 09/02/2013    Avelardo Reesman MRN: [id number] DOB: 04/18/1955   Interim History: Firas Guardado is a 58 y.o. right-handed African [place] male with history of hyerptension, diabetes mellitus, hypertension, seizure disorder, situational DVT/PE s/p IVC filter returning to the clinic for follow-up of leukoencephalopathy of unknown etiology.  The patient was accompanied to the clinic by wife who also provides collateral information.     History of present illness: He was admitted to [facility] on 8/7-8/9 where he was bought by ambulance after being found confused and unconscious at work. 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 Around 7:30pm, his vision started to get blurry and he felt nauseous so wanted to go to the bathroom. He is required to change into/out of a protective gown to prevent contamination of the electrical parts so was on his way to get his gown off and collapsed inside the gowning room. His co-workers said he acted disoriented before going to the bathroom. He was found unconscious with a bleeding nose by his co-workers who called EMS. He said his tongue was sore at the tip and there was blood. By the time he was transported to [facility], he was back to his baseline, including vision and mental status. He was then transferred to [facility] main campus for further evaluation. Blood pressure on arrival to [facility] ED was 139/80.   His work-up included EEG, MRI brain wwo contrast, CTA, echo, and laboratory resting. EEG was normal and MRI brain which showed abnormal patchy areas of T2 signal intensity involving bilateral temporal lobes, occipital lobes, parietal lobes, and left frontal lobe. Some of these areas demonstrate mild enhancement. Patient remained clinically stable and was discharged with out-patient follow-up. His PCP reduced the dose of his lisinopril to 9m which he takes for renal protection because he was concerned  this medication may have contributed to his presentation.   - Follow-up 10/24/2012: Patient doing great, no seizures. Lisinopril stopped due to concern of contributing to PRES. Repeat MRI brain on 10/09/2012 showed improving cortical and subcortical hyperintensities and without enhancement.  Two interim hospitalizations:   (1) 11/6-11/18: Admitted with seizures in the setting of hypertensive emergency required intubation for airway protection. He received Ativan 2 mg x2 and loaded with fosphenytoin. While in the ED, he was noted to have new onset atrial fibrillation with RVR. He is also treated for aspiration pneumonia and discharged on Levaquin. Blood pressure was optimized with labetalol 200 mg twice daily and norvasc 159m  (2) 11/19-12/03/2012: Patient presented to emergency room with respiratory failure and found to have bilateral multifocal pulmonary emboli and right sided DVT. He was intubated and on 11/19 and extubated 911/23. His hospital course was notable for pulmonary edema, hypernatremia, and respiratory insufficiency. He was started on Xeralto.  - Follow-up 01/21/2013: Clinically, the patient was doing very well. MRI of brain (12/31) was ordered given his recent hospitalization for seizures which showed several areas of white matter changes which had progressed since his previous MRI of brain in September 2014. In addition, there is a new left lobar subacute hematoma, which was not previously seen on his CT head dated 11/6. 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 Subacute hematoma may have been due elevated blood pressure and that he is on anticoagulation for PE/DVT.   - Follow-up 02/06/2013: Recently admitted for anticoagulation management and was found to have subacute intraparenchymal hematoma. During that admission, he had repeat MRI  of the brain which showed microhemorrhages so anticoagulation was stopped. He also has cerebral angiogram and lumbar puncture looking for CNS vasculitis.  Results were nonrevealing. Alen Blew was stopped and he had IVC filter placement.   - Follow-up 04/21/2013:  Clinically, he is doing well. Interval MRI brain was done in March which shows waxing/waning cortical and subcortical white matter changes in areas that were previously uninvolved.  No new infarcts.    - Follow-up 05/30/2013:  At his last visit, due to new MRI lesions, patient underwent second LP and had additional lab testing for possible vasculitis, which returned unrevealing.    UPDATE 09/02/2013:  No new complaints, he is doing well.  No interval illnesses, seizures, or hospitalizations. Paraneoplastic panel was negative (reports getting a bill for $9000!, but fortunately insurance is helping with cost)    Medications:  Current Outpatient Prescriptions on File Prior to Visit  Medication Sig Dispense Refill  . amLODipine (NORVASC) 10 MG tablet Take 1 tablet (10 mg total) by mouth daily.  30 tablet    . aspirin EC 81 MG tablet Take 1 tablet (81 mg total) by mouth daily.      Marland Kitchen glimepiride (AMARYL) 2 MG tablet Take 1 tablet (2 mg total) by mouth 2 (two) times daily with a meal. Breakfast and dinner  60 tablet  3  . labetalol (NORMODYNE) 200 MG tablet Take 1 tablet (200 mg total) by mouth 2 (two) times daily.  60 tablet  3  . levETIRAcetam (KEPPRA) 500 MG tablet Take 1 tablet (500 mg total) by mouth 2 (two) times daily.  240 tablet  3  . Linagliptin-Metformin HCl (JENTADUETO) 2.05-998 MG TABS Take 1 tablet by mouth daily.  30 tablet  3  . Multiple Vitamin (MULTIVITAMIN WITH MINERALS) TABS tablet Take 1 tablet by mouth daily.      . rosuvastatin (CRESTOR) 10 MG tablet Take 10 mg by mouth daily.       No current facility-administered medications on file prior to visit.    Allergies:  Allergies  Allergen Reactions  . Lisinopril Other (See Comments)    MD thinks that this medication caused patient to "pass out" "faint".  (blurred vision per spouse)     Review of Systems:  CONSTITUTIONAL:  No fevers, chills, night sweats, or weight loss.   EYES: No visual changes or eye pain ENT: No hearing changes.  No history of nose bleeds.   RESPIRATORY: No cough, wheezing and shortness of breath.   CARDIOVASCULAR: Negative for chest pain, and palpitations.   GI: Negative for abdominal discomfort, blood in stools or black stools.  No recent change in bowel habits.   GU:  No history of incontinence.   MUSCLOSKELETAL: No history of joint pain or swelling.  No myalgias.   SKIN: Negative for lesions, rash, and itching.   ENDOCRINE: Negative for cold or heat intolerance, polydipsia or goiter.   PSYCH:  No depression or anxiety symptoms.   NEURO: As Above.   Vital Signs:  BP 128/80  Pulse 79  Ht 5' 7.72" (1.72 m)  Wt 216 lb 6 oz (98.147 kg)  BMI 33.18 kg/m2  SpO2 97%  Gen:  Well-appearing, no acute distress CV:  Regular rate and rhythm  Neurological Exam: MENTAL STATUS including orientation to time, place, person, recent and remote memory, attention span and concentration, language, and fund of knowledge is normal.  Speech is not dysarthric.  CRANIAL NERVES:  No visual field defects. Pupils equal round and reactive to light.  Normal conjugate, extra-ocular eye movements in all directions of gaze.  Mild left ptosis. Normal facial sensation.  Face is symmetric. Palate elevates symmetrically.  Tongue is midline. Mild bilateral palmomental reflex bilaterally.  MOTOR:  Motor strength is 5/5 in all extremities.  No pronator drift.  Tone is normal.    MSRs:  Reflexes are 2+/4 throughout.  Plantars are down going.  SENSORY:  Intact to vibration and light touch.  COORDINATION/GAIT:  Normal finger-to- nose-finger.  Intact rapid alternating movements bilaterally.  Gait narrow based and stable.   Data: MRI brain 10/09/2012:  1. Improving cortical and subcortical T2 hyperintensities this.  2. Previously seen intravascular enhancement is no longer present.  3. This most likely represents  resolving posterior reversible encephalopathy or H D E. No definite encephalomalacia is evident to confirm previous infarcts.  4. 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  MRI brain 01/29/2013: Progressed peripheral supratentorial gray-white matter FLAIR abnormality's, including left frontal lesion which shows faint  enhancement. In addition, new left posterior temporal 15 x 14 mm he subacute hematoma. Additional supratentorial micro hemorrhages. Constellation of findings may reflect chronic severe hypertension, with subsequent lobar hematoma. Scattered micro hemorrhages can also be seen with amyloid angiopathy. The FLAIR abnormality's may reflect superimposed PRES. Overall progression of disease process. No atrophy.  MRI brain 02/04/2013:  1. Possible punctate area of restricted diffusion in the anterior right frontal lobe subcortical white matter such as due to acute lacunar infarct. However, this might also be imaging artifact related to nearby chronic blood products.  2. No acute mass effect or new intracranial hemorrhage. Stable left temporal lobe hemorrhage. Advanced chronic underlying mostly micro hemorrhages in the brain elsewhere.  3. 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  4. Top differential considerations to consider in addition to PRES include an underlying coagulopathy (such as protein C deficiency or other hypercoagulable state) and chronic vasculitis such as due to connective tissue disorder.  MRI brain 04/10/2013: There is no evidence of acute infarct. Focus of remote hemorrhage is again seen in the right occipital lobe. Focus of posterior left temporal lobe hemorrhage has decreased in size with decreased surrounding edema. Additionally, multiple foci of remote microhemorrhage are again seen throughout both cerebral  hemispheres, grossly stable from the prior MRI allowing for differences in technique.  Focus of cortical/subcortical T2 hyperintensity in the right frontal lobe has increased from the prior study (series 7, image 17). Cortical/subcortical T2 hyperintensity in the posterior left frontal lobe has slightly decreased (series 7, image 16). There is a new region of T2 hyperintensity in the subcortical right parietal lobe (series 7, image 16). New subcortical T2 hyperintensity is present in the left temporoparietal region (series 7, image 11). There is a new, small focus of cortical T2 hyperintensity in the left frontal lobe (series 7, image 14). Other regions of cortical and subcortical T2 hyperintensity in the frontal lobes have decreased or resolved. Patchy T2 hyperintensities in the periventricular white matter are similar to the prior study. Ventricles and sulci are within normal limits for age. There is no midline shift or extra-axial fluid collection. Dedicated thin section imaging through the temporal lobes demonstrates normal volume and signal of the hippocampi. No definite abnormal enhancement is identified. Major intracranial vascular flow voids are preserved. Orbits are unremarkable. Paranasal sinuses and mastoid air cells are clear.  IMPRESSION:  1. No evidence of  acute infarct.  2. Evolving left temporal lobe hemorrhage. Grossly stable appearance of other bilateral cerebral hemorrhages allowing for differences in technique.  3. Waxing and waning cortical and subcortical T2 hyperintensities as above. Findings could represent PRES, vasculitis, or other inflammatory conditions.    Cerebral angiogram 02/10/2013: Mild focal areas of segmental caliber narrowing of the distal left pericallosal branches. These findings are nonspecific. Differential considerations would changes secondary to intracranial arteriosclerosis, vasospasm or vasculitis. Clinical correlation is recommended.  CSF 02/07/2013:  R21 W1 G59  P57,   IgG index 5.0, OCB neg CSF 05/29/2012:  R12  W1  G65 P48  IgG index 2.9 MBP <0.2, OCB neg    Cryptococcal neg, Lyme neg, VZV neg, ACE 5, cytology neg  Labs 02/05/2013:  ESR 15, RPR nonreactive, ANA neg, total complement >60*, C4 31, C3 128  Labs 04/21/2013:  CRP <0.5, ESR 1, SPEP/UPEP with IFE no M protein, ACE 23, celiac panel neg, ENA neg, cryoglobulin neg, ANCA neg, neocomplete paraneoplastic antibody testing negative  Hypercoagulable work-up:  antithrombin III, protein C, protein S, lupus anticoagulant, homocystine, factor 5 Leiden - wnl HIV, ACE - neg    IMPRESSION: 1. 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 Underlying etiology remains uncertain especially since his BP was not elevated and there was no other associated risk factors. Subsequent MRI in September 2014 showed white matter changes involving the parietal and occipital lobes, with milder changes in the frontal and temporal regions, but overall they are improved and there is no longer enhancement of these lesions.   Second GTC on 11/6 with clustering in the setting of hypertensive emergency. Required intubation. Although this may have been a provoked seizure (elevated BP), the fact that it was prolonged and we still are not clear why he had his first seizure, keppra 527m BID was started.   MRI brain in December showed showed several areas of white matter changes which had progressed since his previous MRI of brain in September 2014. In addition, there is a new left lobar subacute hematoma, which was not previously seen on his CT head dated 11/6. Because he was taking Xeralto for DVT/PE, patient was admitted for anticoagulation management. During the admission, he underwent cerebral angriogram, LP, and IVF filter placement. Work-up did not reveal vasculitis or vasculopathy.   Follow-up MRI brain in March shows areas of new white matter changes in previously  uninvolved regions as well as some resolution of old lesions.  There is no enhancement.   Repeat LP with additional vasculitis labs returned negative  Work-up thus far has included CSF analysis, serology testing including paraneoplastic panel, hypercoagulable evaluation, and cerebral angriogram which has been unrevealing.  I am less inclined to think this is residual effects of PRES and more concerned about an an ongoing process (?vasculitis vs. CNS lymphoma vs amyloid angiopathy vs CADISIL) It is intriguing that these lesions are waxing/waning without any treatment.   2. Recent PE and DVT (situational) S/p IVC filter   Anticoagulation contraindicated in the setting of recent intraparenchymal hematoma  4. History of atrial fibrillation with RVR, currently in NSR (not on anticoagulation) 5. Hypertension   on labetalol 2078mBID and norvasc 1016maily  7. Diabetes mellitus  Today, he is doing well without any new complaints and exam is non-focal.  Plan to repeat MRI brain and cervical spine in 3-m37-monthf there is progression of disease, can reconsider brain biopsy and/or genetic testing  for CADISIL.    I'll continue to follow him closely.  Should he develop any new symptoms, low threshold for brain biopsy.   PLAN/RECOMMENDATIONS:  1.  Continue Keppra 510m BID  2.  Reschedule MRI brain and cervical spine wwo contrast to 3106-month since he is doing well clinically 3.  Return to clinic in 3-60-month  The duration of this appointment visit was 20 minutes of face-to-face time with the patient.  Greater than 50% of this time was spent in counseling, explanation of diagnosis, planning of further management, and coordination of care.   Thank you for allowing me to participate in patient's care.  If I can answer any additional questions, I would be pleased to do so.    Sincerely,    Donika K. PatPosey ProntoO

## 2013-09-05 ENCOUNTER — Ambulatory Visit (HOSPITAL_COMMUNITY): Payer: BC Managed Care – PPO

## 2013-12-02 ENCOUNTER — Other Ambulatory Visit (HOSPITAL_COMMUNITY): Payer: BC Managed Care – PPO

## 2013-12-02 ENCOUNTER — Ambulatory Visit (HOSPITAL_COMMUNITY): Payer: BC Managed Care – PPO

## 2013-12-04 ENCOUNTER — Ambulatory Visit (HOSPITAL_COMMUNITY)
Admission: RE | Admit: 2013-12-04 | Discharge: 2013-12-04 | Disposition: A | Discharge status: Home / Self Care | Payer: BC Managed Care – PPO | Source: Ambulatory Visit | Attending: Neurology | Admitting: Neurology

## 2013-12-04 DIAGNOSIS — G40909 Epilepsy, unspecified, not intractable, without status epilepticus: Secondary | ICD-10-CM | POA: Insufficient documentation

## 2013-12-04 DIAGNOSIS — E119 Type 2 diabetes mellitus without complications: Secondary | ICD-10-CM | POA: Diagnosis not present

## 2013-12-04 DIAGNOSIS — M4802 Spinal stenosis, cervical region: Secondary | ICD-10-CM | POA: Insufficient documentation

## 2013-12-04 DIAGNOSIS — I10 Essential (primary) hypertension: Secondary | ICD-10-CM | POA: Insufficient documentation

## 2013-12-04 LAB — POCT I-STAT CREATININE: Creatinine, Ser: 1.1 mg/dL (ref 0.50–1.35)

## 2013-12-04 MED ORDER — GADOBENATE DIMEGLUMINE 529 MG/ML IV SOLN
20.0000 mL | Freq: Once | INTRAVENOUS | Status: AC | PRN
  Administered 2013-12-04: 20 mL via INTRAVENOUS

## 2013-12-05 ENCOUNTER — Telehealth: Payer: Self-pay | Admitting: Neurology

## 2013-12-05 NOTE — Telephone Encounter (Signed)
I attempted to contact patient via phone today regarding the results of MRI brain, however there was no answer so a message was left for the patient to return my call.   Lodie Waheed K. Andrick Rust, DO   

## 2014-01-05 ENCOUNTER — Encounter: Payer: Self-pay | Admitting: Neurology

## 2014-01-05 ENCOUNTER — Ambulatory Visit (INDEPENDENT_AMBULATORY_CARE_PROVIDER_SITE_OTHER): Payer: BC Managed Care – PPO | Admitting: Neurology

## 2014-01-05 VITALS — BP 140/78 | HR 87 | Ht 69.0 in | Wt 222.4 lb

## 2014-01-05 DIAGNOSIS — R9089 Other abnormal findings on diagnostic imaging of central nervous system: Secondary | ICD-10-CM

## 2014-01-05 DIAGNOSIS — R569 Unspecified convulsions: Secondary | ICD-10-CM

## 2014-01-05 DIAGNOSIS — I82511 Chronic embolism and thrombosis of right femoral vein: Secondary | ICD-10-CM

## 2014-01-05 DIAGNOSIS — I616 Nontraumatic intracerebral hemorrhage, multiple localized: Secondary | ICD-10-CM

## 2014-01-05 DIAGNOSIS — R930 Abnormal findings on diagnostic imaging of skull and head, not elsewhere classified: Secondary | ICD-10-CM

## 2014-01-05 MED ORDER — LEVETIRACETAM 500 MG PO TABS: 500.0000 mg | ORAL_TABLET | Freq: Two times a day (BID) | ORAL | Status: DC

## 2014-01-05 NOTE — Patient Instructions (Addendum)
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  You are welcome to research this online and share your thoughts with me. Other considerations include CADASIL, but based on your imaging, this seems to be less likely.  Currently, no active treatments for prevention of hemorrhage recurrence in cerebral amyloid angiopathy have been definitively established. However, some measures can reasonably be taken. Careful control of hypertension appears to be a prudent course in patients with cerebral amyloid angiopathy.    Antiplatelet agents also increase the risk of hemorrhage, but the effect is substantially smaller. Therefore, it is reasonable practice to consider antiplatelet treatment for certain definite indications such as prevention of myocardial infarction, and otherwise to withhold them from patient with cerebral amyloid angiopathy-related hemorrhage.  In your case, I recommend stopping aspirin.  For seizures, refills for Keppra 500mg  twice daily has been sent.  Let's plan to re-image the brain in 23-months.  I'll see you back after imaging, unless you need me sooner.

## 2014-01-05 NOTE — Progress Notes (Signed)
Follow-up Visit   Date: 01/05/2014    Juan Glenn MRN: [id number] DOB: [birth year]   Interim History: Juan Glenn is a 58 y.o. 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      History of present illness: He was admitted to [facility] on 8/7-8/9 where he was bought by ambulance after being found confused and unconscious at work. 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 Around 7:30pm, his vision started to get blurry and he felt nauseous so wanted to go to the bathroom. He is required to change into/out of a protective gown to prevent contamination of the electrical parts so was on his way to get his gown off and collapsed inside the gowning room. His co-workers said he acted disoriented before going to the bathroom. He was found unconscious with a bloody nose by his co-workers who activated EMS. He said his tongue was sore at the tip and there was blood. By the time he was transported to [facility], he was back to his baseline, including vision and mental status. He was then transferred to [facility] main campus for further evaluation. Blood pressure on arrival to [facility] ED was 139/80.   His work-up included EEG, MRI brain wwo contrast, CTA, echo, and laboratory resting. EEG was normal and MRI brain which showed abnormal patchy areas of T2 signal intensity involving bilateral temporal lobes, occipital lobes, parietal lobes, and left frontal lobe. Some of these areas demonstrate mild enhancement. Patient remained clinically stable and was discharged with out-patient follow-up. His PCP reduced the dose of his lisinopril to 80m which he takes for renal protection because he was concerned this medication may have contributed to his presentation of suspected PRES.    - Follow-up 10/24/2012: Lisinopril stopped due to concern of contributing to PRES. Repeat MRI brain on 10/09/2012 showed improving cortical and subcortical hyperintensities and without enhancement.  Two interim hospitalizations:    (1) 11/6-11/18: Admitted with seizures in the setting of hypertensive emergency required intubation for airway protection. He received Ativan 2 mg x2 and loaded with fosphenytoin. While in the ED, he was noted to have new onset atrial fibrillation with RVR. He is also treated for aspiration pneumonia and discharged on Levaquin. Blood pressure was optimized with labetalol 200 mg twice daily and norvasc 138m   (2) 11/19-12/03/2012: Patient presented to emergency room with respiratory failure and found to have bilateral multifocal pulmonary emboli and right sided DVT. He was intubated and on 11/19 and extubated 11/23. His hospital course was notable for pulmonary edema, hypernatremia, and respiratory insufficiency. He was started on Xeralto.  - Follow-up 01/21/2013: Clinically, the patient was doing very well. MRI of brain (12/31) was ordered given his recent hospitalization for seizures which showed several areas of white matter changes which had progressed since his previous MRI of brain in September 2014. In addition, there is a new left lobar subacute hematoma, which was not previously seen on his CT head dated 11/6. 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 Subacute hematoma may have been due elevated blood pressure and that he is on anticoagulation for PE/DVT.    - Follow-up 02/06/2013: Recently admitted for anticoagulation management and was found to have subacute intraparenchymal hematoma. During that admission, he had repeat MRI of the brain which showed microhemorrhages so anticoagulation was stopped.  He also has cerebral angiogram and lumbar puncture looking for CNS vasculitis. Results were nonrevealing. Alen Blew was stopped and he had IVC filter placement.    - Follow-up  04/21/2013:  Clinically, he is doing well. Interval MRI brain was done in March which shows waxing/waning cortical and subcortical white matter changes in areas that were previously uninvolved.  No new infarcts.    - Follow-up 05/30/2013:  At his last visit, due to new MRI lesions, patient underwent second LP and had additional lab testing for possible vasculitis, which returned unrevealing.    Follow-up 09/02/2013:  No new complaints.  Paraneoplastic panel was negative (reports getting a bill for $9000!, but fortunately insurance is helping with cost)    UPDATE 01/05/2014:  No new complaints, he is doing well.  No interval illnesses, seizures, or hospitalizations.   He is here to discuss MRI brain which showed interval new (chronic) left amygdala ICH.  Denies any new mood, behavior, or memory changes.     Medications:  Current Outpatient Prescriptions on File Prior to Visit  Medication Sig Dispense Refill  . amLODipine (NORVASC) 10 MG tablet Take 1 tablet (10 mg total) by mouth daily. 30 tablet   . glimepiride (AMARYL) 2 MG tablet Take 1 tablet (2 mg total) by mouth 2 (two) times daily with a meal. Breakfast and dinner 60 tablet 3  . labetalol (NORMODYNE) 200 MG tablet Take 1 tablet (200 mg total) by mouth 2 (two) times daily. 60 tablet 3  . Linagliptin-Metformin HCl (JENTADUETO) 2.05-998 MG TABS Take 1 tablet by mouth daily. 30 tablet 3  . Multiple Vitamin (MULTIVITAMIN WITH MINERALS) TABS tablet Take 1 tablet by mouth daily.    . ONE TOUCH LANCETS MISC     . pantoprazole (PROTONIX) 40 MG tablet TAKE 1 BY MOUTH DAILY    . rosuvastatin (CRESTOR) 10 MG tablet Take 10 mg by mouth daily.     No current facility-administered medications on file prior to visit.    Allergies:  Allergies  Allergen Reactions  . Lisinopril Other (See Comments)    MD thinks that this medication caused patient to "pass out" "faint".  (blurred vision per spouse)     Review of Systems:  CONSTITUTIONAL: No fevers,  chills, night sweats, or weight loss.   EYES: No visual changes or eye pain ENT: No hearing changes.  No history of nose bleeds.   RESPIRATORY: No cough, wheezing and shortness of breath.   CARDIOVASCULAR: Negative for chest pain, and palpitations.   GI: Negative for abdominal discomfort, blood in stools or black stools.  No recent change in bowel habits.   GU:  No history of incontinence.   MUSCLOSKELETAL: No history of joint pain or swelling.  No myalgias.   SKIN: Negative for lesions, rash, and itching.   ENDOCRINE: Negative for cold or heat intolerance, polydipsia or goiter.   PSYCH:  No depression or anxiety symptoms.   NEURO: As Above.   Vital Signs:  BP 140/78 mmHg  Pulse 87  Ht _0  (1.753 m)  Wt 222 lb 7 oz (100.897 kg)  BMI 32.83 kg/m2  SpO2 97%  Gen:  Well-appearing, no acute distress  Neurological Exam: MENTAL STATUS including orientation to time, place, person, recent and remote memory, attention span and concentration, language, and fund of knowledge is normal.  Speech is not dysarthric.  CRANIAL NERVES:   Pupils equal round and reactive to light.  Normal conjugate, extra-ocular eye movements in all directions of gaze.  Mild left  ptosis.  Face is symmetric.   MOTOR:  Motor strength is 5/5 in all extremities.  No pronator drift.  Tone is normal.    COORDINATION/GAIT:  Gait narrow based and stable.   Data: MRI brain 10/09/2012:  1. Improving cortical and subcortical T2 hyperintensities this.  2. Previously seen intravascular enhancement is no longer present.  3. This most likely represents resolving posterior reversible encephalopathy or H D E. No definite encephalomalacia is evident to confirm previous infarcts.  4. 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  MRI brain 01/29/2013: Progressed peripheral supratentorial gray-white matter FLAIR abnormality's, including left frontal lesion which shows faint  enhancement. In addition, new  left posterior temporal 15 x 14 mm he subacute hematoma. Additional supratentorial micro hemorrhages. Constellation of findings may reflect chronic severe hypertension, with subsequent lobar hematoma. Scattered micro hemorrhages can also be seen with amyloid angiopathy. The FLAIR abnormality's may reflect superimposed PRES. Overall progression of disease process. No atrophy.  MRI brain 02/04/2013:  1. Possible punctate area of restricted diffusion in the anterior right frontal lobe subcortical white matter such as due to acute lacunar infarct. However, this might also be imaging artifact related to nearby chronic blood products.  2. No acute mass effect or new intracranial hemorrhage. Stable left temporal lobe hemorrhage. Advanced chronic underlying mostly micro hemorrhages in the brain elsewhere.  3. 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  4. Top differential considerations to consider in addition to PRES include an underlying coagulopathy (such as protein C deficiency or other hypercoagulable state) and chronic vasculitis such as due to connective tissue disorder.  MRI brain 04/10/2013: There is no evidence of acute infarct. Focus of remote hemorrhage is again seen in the right occipital lobe. Focus of posterior left temporal lobe hemorrhage has decreased in size with decreased surrounding edema. Additionally, multiple foci of remote microhemorrhage are again seen throughout both cerebral hemispheres, grossly stable from the prior MRI allowing for differences in technique.  Focus of cortical/subcortical T2 hyperintensity in the right frontal lobe has increased from the prior study (series 7, image 17). Cortical/subcortical T2 hyperintensity in the posterior left frontal lobe has slightly decreased (series 7, image 16). There is a new region of T2 hyperintensity in the  subcortical right parietal lobe (series 7, image 16). New subcortical T2 hyperintensity is present in the left temporoparietal region (series 7, image 11). There is a new, small focus of cortical T2 hyperintensity in the left frontal lobe (series 7, image 14). Other regions of cortical and subcortical T2 hyperintensity in the frontal lobes have decreased or resolved. Patchy T2 hyperintensities in the periventricular white matter are similar to the prior study. Ventricles and sulci are within normal limits for age. There is no midline shift or extra-axial fluid collection. Dedicated thin section imaging through the temporal lobes demonstrates normal volume and signal of the hippocampi. No definite abnormal enhancement is identified. Major intracranial vascular flow voids are preserved. Orbits are unremarkable. Paranasal sinuses and mastoid air cells are clear.  IMPRESSION:  1. No evidence of acute infarct.  2. Evolving left temporal lobe hemorrhage. Grossly stable appearance of other bilateral cerebral hemorrhages allowing for differences in technique.  3. Waxing and waning cortical and subcortical T2 hyperintensities as above. Findings could represent PRES, vasculitis, or other inflammatory conditions.   MRI cervical spine 12/04/2013: 1. Negative cervical spine except for degenerative changes. No  signal abnormality in the visualized spinal cord. 2. Multifactorial mild spinal stenosis at C4-C5 and C5-C6. Multifactorial moderate to severe foraminal stenosis at the bilateral C6 and C7 nerve levels.  MRI brain wwo contrast 12/04/2013: 1. The patient has suffered a hemorrhage in the left amygdala since the scan in March, but now this is chronic. Given the constellation of numerous previous hemorrhages, some more macroscopic than microscopic, consider amyloid angiopathy. Also I note the presence of an IVC filter in this patient, alternatively is there a history of congenital coagulopathy? 2. There are areas of  superficial siderosis over both convexities. 3. Virtually resolved cerebral edema which was associated with parenchymal hemorrhages in March. Underlying nonspecific white matter signal changes, not strongly suggestive of demyelinating disease and mild by comparison to the extent of chronic blood products.  Cerebral angiogram 02/10/2013: Mild focal areas of segmental caliber narrowing of the distal left pericallosal branches. These findings are nonspecific. Differential considerations would changes secondary to intracranial arteriosclerosis, vasospasm or vasculitis. Clinical correlation is recommended.  CSF 02/07/2013:  R21 W1 G59 P57,   IgG index 5.0, OCB neg CSF 05/29/2012:  R12  W1  G65 P48  IgG index 2.9 MBP <0.2, OCB neg    Cryptococcal neg, Lyme neg, VZV neg, ACE 5, cytology neg  Labs 02/05/2013:  ESR 15, RPR nonreactive, ANA neg, total complement >60*, C4 31, C3 128  Labs 04/21/2013:  CRP <0.5, ESR 1, SPEP/UPEP with IFE no M protein, ACE 23, celiac panel neg, ENA neg, cryoglobulin neg, ANCA neg, neocomplete paraneoplastic antibody testing negative  Hypercoagulable work-up:  antithrombin III, protein C, protein S, lupus anticoagulant, homocystine, factor 5 Leiden - wnl HIV, ACE - neg    IMPRESSION: 1. 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  He has underwent extensive work-up for ?PRES and vasculitis including cerebral angiogram and CSF testing x 2 which return normal. We reviewed his most recent MRI brain which showed interval new left amygdala ICH.  He denies any problems with memory or mood.  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 We discussed that definitive diagnosis is made by tissue biopsy, but he is not keen on brain  biopsy which I can understand.  Other considerations include CADASIL, but this seems to be less likely.  2. PE and DVT (situational) s/p IVC filter   4. History of atrial fibrillation with RVR, currently in NSR (not on anticoagulation)  5. Hypertension, on labetalol 275m BID and norvasc 115mdaily , followed by PCP  7. Diabetes mellitus, well-controlled, followed by PCP   PLAN/RECOMMENDATIONS:  Currently, no active treatments for prevention of hemorrhage recurrence in cerebral amyloid angiopathy have been definitively established. However, some measures can reasonably be taken. Careful control of hypertension appears to be a prudent course in patients with cerebral amyloid angiopathy.    Antiplatelet agents also increase the risk of hemorrhage, but the effect is substantially smaller. Therefore, it is reasonable practice to consider antiplatelet treatment for certain definite indications such as prevention of myocardial infarction, and otherwise to withhold them from patient with cerebral amyloid angiopathy-related hemorrhage.  I would recommend stopping aspirin [id number] he needs it for cardiac reasons.    For seizures, refills for Keppra 500m51mice daily has been sent.  Let's plan to repeat MRI brain wwo contrast  in 56-month.  Return to clinic in 665-month or sooner as needed.   The duration of this appointment visit was 45 minutes of face-to-face time with the patient.  Greater than 50% of this time was spent in counseling, explanation of diagnosis, planning of further management, and coordination of care.   Thank you for allowing me to participate in patient's care.  If I can answer any additional questions, I would be pleased to do so.    Sincerely,    Angeligue Bowne K. PaPosey ProntoDO

## 2014-01-05 NOTE — Progress Notes (Signed)
Note faxed.

## 2014-01-06 DIAGNOSIS — E854 Organ-limited amyloidosis: Secondary | ICD-10-CM | POA: Insufficient documentation

## 2014-05-28 IMAGING — CR DG CHEST 1V PORT
1 series · 1 of 1 positions shown · non-contrast
Comparison: 12/21/2012

CLINICAL DATA: Pulmonary edema and pneumonia.

EXAM:
PORTABLE CHEST - 1 VIEW

[AP]
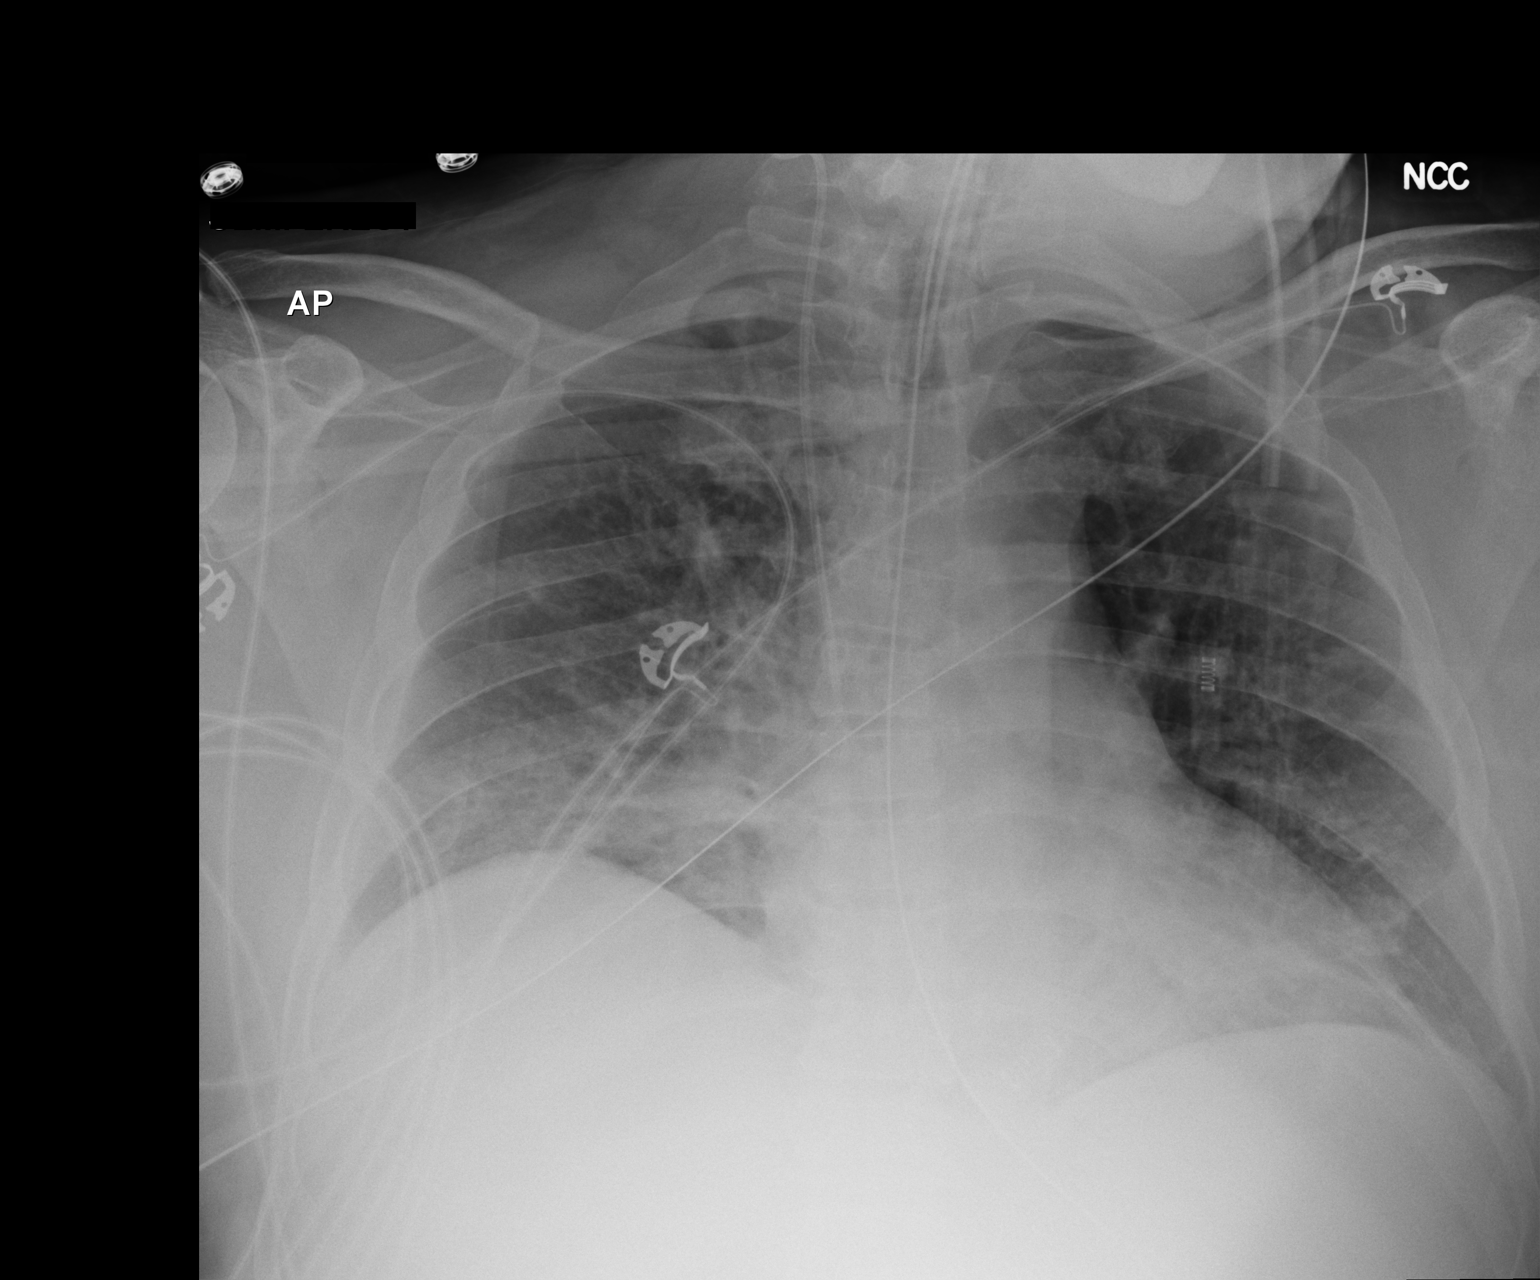

[1 of 1 positions shown; findings below may reference images not displayed]

FINDINGS: Endotracheal tube remains with the tip approximately 6 cm above the
carina. Nasogastric tube extends below the diaphragm. Central line
positioning is stable in the lower SVC. Lungs show slightly improved
expansion on the left. Persistent atelectasis versus infiltrate
present at both lung bases, right greater than left. No overt edema
or significant pleural fluid is identified. The heart size is
stable.
IMPRESSION: Improved aeration of the left lung. Residual atelectasis versus
infiltrates at both lung bases, right greater than left.

## 2014-06-09 ENCOUNTER — Ambulatory Visit
Admission: RE | Admit: 2014-06-09 | Discharge: 2014-06-09 | Disposition: A | Discharge status: Home / Self Care | Payer: 59 | Source: Ambulatory Visit | Attending: Neurology | Admitting: Neurology

## 2014-06-09 DIAGNOSIS — I616 Nontraumatic intracerebral hemorrhage, multiple localized: Secondary | ICD-10-CM

## 2014-06-09 DIAGNOSIS — R569 Unspecified convulsions: Secondary | ICD-10-CM

## 2014-06-09 DIAGNOSIS — R9089 Other abnormal findings on diagnostic imaging of central nervous system: Secondary | ICD-10-CM

## 2014-06-09 MED ORDER — GADOBENATE DIMEGLUMINE 529 MG/ML IV SOLN: 20.0000 mL | Freq: Once | INTRAVENOUS | Status: AC | PRN

## 2014-06-19 ENCOUNTER — Other Ambulatory Visit: Payer: Self-pay | Admitting: *Deleted

## 2014-06-19 MED ORDER — LEVETIRACETAM 500 MG PO TABS: 500.0000 mg | ORAL_TABLET | Freq: Two times a day (BID) | ORAL | Status: DC

## 2014-06-24 ENCOUNTER — Telehealth: Payer: Self-pay | Admitting: Neurology

## 2014-06-24 NOTE — Telephone Encounter (Signed)
06/24/14-tried to call both contact nubmers, got no answer-will have to try again. Re: need to r/s 06/30/14 appt w/ Dr. Posey Pronto due to template change / Sherri S.

## 2014-07-07 ENCOUNTER — Ambulatory Visit: Payer: BC Managed Care – PPO | Admitting: Neurology

## 2014-07-10 ENCOUNTER — Ambulatory Visit (INDEPENDENT_AMBULATORY_CARE_PROVIDER_SITE_OTHER): Payer: 59 | Admitting: Neurology

## 2014-07-10 ENCOUNTER — Encounter: Payer: Self-pay | Admitting: Neurology

## 2014-07-10 VITALS — BP 130/80 | HR 85 | Ht 69.0 in | Wt 221.1 lb

## 2014-07-10 DIAGNOSIS — G9349 Other encephalopathy: Secondary | ICD-10-CM

## 2014-07-10 DIAGNOSIS — R9089 Other abnormal findings on diagnostic imaging of central nervous system: Secondary | ICD-10-CM

## 2014-07-10 DIAGNOSIS — R930 Abnormal findings on diagnostic imaging of skull and head, not elsewhere classified: Secondary | ICD-10-CM

## 2014-07-10 DIAGNOSIS — I680 Cerebral amyloid angiopathy: Secondary | ICD-10-CM | POA: Diagnosis not present

## 2014-07-10 DIAGNOSIS — E854 Organ-limited amyloidosis: Secondary | ICD-10-CM

## 2014-07-10 MED ORDER — LEVETIRACETAM 500 MG PO TABS: 500.0000 mg | ORAL_TABLET | Freq: Two times a day (BID) | ORAL | Status: DC

## 2014-07-10 NOTE — Patient Instructions (Signed)
You are looking great today! Keep up the good work with managing your blood pressure and diabetes Continue keppra 500mg  twice daily.  Call my office if the mail order did not go through correctly I look forward to seeing you again in 9 months, we can repeat imaging at that time.

## 2014-07-10 NOTE — Progress Notes (Signed)
Follow-up Visit   Date: 07/10/2014    Juan Glenn MRN: [id number] DOB: 1955-11-14   Interim History: Juan Glenn is a 59 y.o. 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      History of present illness: He was admitted to [facility] on 8/7-8/9 where he was bought by ambulance after being found confused and unconscious at work. 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 Around 7:30pm, his vision started to get blurry and he felt nauseous so wanted to go to the bathroom. He is required to change into/out of a protective gown to prevent contamination of the electrical parts so was on his way to get his gown off and collapsed inside the gowning room. His co-workers said he acted disoriented before going to the bathroom. He was found unconscious with a bloody nose by his co-workers who activated EMS. He said his tongue was sore at the tip and there was blood. By the time he was transported to [facility], he was back to his baseline, including vision and mental status. He was then transferred to [facility] main campus for further evaluation. Blood pressure on arrival to [facility] ED was 139/80.   His work-up included EEG, MRI brain wwo contrast, CTA, echo, and laboratory resting. EEG was normal and MRI brain which showed abnormal patchy areas of T2 signal intensity involving bilateral temporal lobes, occipital lobes, parietal lobes, and left frontal lobe. Some of these areas demonstrate mild enhancement. Patient remained clinically stable and was discharged with out-patient follow-up. His PCP reduced the dose of his lisinopril to 34m which he takes for renal protection because he was concerned this medication may have contributed to his presentation of suspected PRES.    - Follow-up 10/24/2012: Lisinopril stopped due to concern of contributing to PRES. Repeat MRI brain on 10/09/2012 showed improving cortical and subcortical hyperintensities and without enhancement.  Two interim hospitalizations:    (1) 11/6-11/18: Admitted with seizures in the setting of hypertensive emergency required intubation for airway protection. He received Ativan 2 mg x2 and loaded with fosphenytoin. While in the ED, he was noted to have new onset atrial fibrillation with RVR. He is also treated for aspiration pneumonia and discharged on Levaquin. Blood pressure was optimized with labetalol 200 mg twice daily and norvasc 146m   (2) 11/19-12/03/2012: Patient presented to emergency room with respiratory failure and found to have bilateral multifocal pulmonary emboli and right sided DVT. He was intubated and on 11/19 and extubated 11/23. His hospital course was notable for pulmonary edema, hypernatremia, and respiratory insufficiency. He was started on Xeralto.  - Follow-up 01/21/2013: Clinically, the patient was doing very well. MRI of brain (12/31) was ordered given his recent hospitalization for seizures which showed several areas of white matter changes which had progressed since his previous MRI of brain in September 2014. In addition, there is a new left lobar subacute hematoma, which was not previously seen on his CT head dated 11/6. 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 Subacute hematoma may have been due elevated blood pressure and that he is on anticoagulation for PE/DVT.    - Follow-up 02/06/2013: Recently admitted for anticoagulation management and was found to have subacute intraparenchymal hematoma. During that admission, he had repeat MRI of the brain which showed microhemorrhages so anticoagulation was stopped.  He also has cerebral angiogram and lumbar puncture looking for CNS vasculitis. Results were nonrevealing. Juan Glenn was stopped and he had IVC filter placement.    - Follow-up  04/21/2013:  Clinically, he is doing well. Interval MRI brain was done in March which shows waxing/waning cortical and subcortical white matter changes in areas that were previously uninvolved.  No new infarcts.    - Follow-up 05/30/2013:  At his last visit, due to new MRI lesions, patient underwent second LP and had additional lab testing for possible vasculitis, which returned unrevealing.    Follow-up 09/02/2013:   Paraneoplastic panel was negative (reports getting a bill for $9000!, but fortunately insurance is helping with cost)    UPDATE 01/05/2014:  No new issues. He is here to discuss MRI brain which showed interval new (chronic) left amygdala ICH.    UPDATE 07/10/2014:  No new complaints and is doing well.  No interval illnesses, seizures, or hospitalizations.   He is here to discuss MRI brain which showed no new interval hemorrhages.     Medications:  Current Outpatient Prescriptions on File Prior to Visit  Medication Sig Dispense Refill  . amLODipine (NORVASC) 10 MG tablet Take 1 tablet (10 mg total) by mouth daily. 30 tablet   . glimepiride (AMARYL) 2 MG tablet Take 1 tablet (2 mg total) by mouth 2 (two) times daily with a meal. Breakfast and dinner 60 tablet 3  . glucose blood (ONE TOUCH ULTRA TEST) test strip TEST twice a day    . labetalol (NORMODYNE) 200 MG tablet Take 1 tablet (200 mg total) by mouth 2 (two) times daily. 60 tablet 3  . Linagliptin-Metformin HCl (JENTADUETO) 2.05-998 MG TABS Take 1 tablet by mouth daily. 30 tablet 3  . Multiple Vitamin (MULTIVITAMIN WITH MINERALS) TABS tablet Take 1 tablet by mouth daily.    . ONE TOUCH LANCETS MISC     . ONE TOUCH ULTRA TEST test strip 2 (two) times daily. for testing  1  . pantoprazole (PROTONIX) 40 MG tablet TAKE 1 BY MOUTH DAILY    . rosuvastatin (CRESTOR) 10 MG tablet Take 10 mg by mouth daily.     No current facility-administered medications on file prior to visit.    Allergies:  Allergies  Allergen Reactions  .  Lisinopril Other (See Comments)    MD thinks that this medication caused patient to "pass out" "faint".  (blurred vision per spouse)     Review of Systems:  CONSTITUTIONAL: No fevers, chills, night sweats, or weight loss.   EYES: No visual changes or eye pain ENT: No hearing changes.  No history of nose bleeds.   RESPIRATORY: No cough, wheezing and shortness of breath.   CARDIOVASCULAR: Negative for chest pain, and palpitations.   GI: Negative for abdominal discomfort, blood in stools or black stools.  No recent change in bowel habits.   GU:  No history of incontinence.   MUSCLOSKELETAL: No history of joint pain or swelling.  No myalgias.   SKIN: Negative for lesions, rash, and itching.   ENDOCRINE: Negative for cold or heat intolerance, polydipsia or goiter.   PSYCH:  No depression or anxiety symptoms.   NEURO: As Above.   Vital Signs:  BP 130/80 mmHg  Pulse 85  Ht _0  (1.753 m)  Wt 221 lb 2 oz (100.302 kg)  BMI 32.64 kg/m2  SpO2 95%  Gen:  Well-appearing, no acute distress  Neurological Exam: MENTAL STATUS including orientation to time, place, person, recent and remote memory,  attention span and concentration, language, and fund of knowledge is normal.  Speech is not dysarthric.  CRANIAL NERVES:   Pupils equal round and reactive to light.  Normal conjugate, extra-ocular eye movements in all directions of gaze.  Mild left ptosis.  Face is symmetric.   MOTOR:  Motor strength is 5/5 in all extremities.  No pronator drift.  Tone is normal.    COORDINATION/GAIT:  Gait narrow based and stable.   Data: MRI brain 10/09/2012:  1. Improving cortical and subcortical T2 hyperintensities this.  2. Previously seen intravascular enhancement is no longer present.  3. This most likely represents resolving posterior reversible encephalopathy or H D E. No definite encephalomalacia is evident to confirm previous infarcts.  4. Stable hemorrhagic infarct or focal trauma in the right  occipital lobe.  MRI brain 01/29/2013: Progressed peripheral supratentorial gray-white matter FLAIR abnormality's, including left frontal lesion which shows faint  enhancement. In addition, new left posterior temporal 15 x 14 mm he subacute hematoma. Additional supratentorial micro hemorrhages. Constellation of findings may reflect chronic severe hypertension, with subsequent lobar hematoma. Scattered micro hemorrhages can also be seen with amyloid angiopathy. The FLAIR abnormality's may reflect superimposed PRES. Overall progression of disease process. No atrophy.  MRI brain 02/04/2013:  1. Possible punctate area of restricted diffusion in the anterior right frontal lobe subcortical white matter such as due to acute lacunar infarct. However, this might also be imaging artifact related to nearby chronic blood products.  2. No acute mass effect or new intracranial hemorrhage. Stable left temporal lobe hemorrhage. Advanced chronic underlying mostly micro hemorrhages in the brain elsewhere.  3. 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  4. Top differential considerations to consider in addition to PRES include an underlying coagulopathy (such as protein C deficiency or other hypercoagulable state) and chronic vasculitis such as due to connective tissue disorder.  MRI brain 04/10/2013: 1. No evidence of acute infarct.  2. Evolving left temporal lobe hemorrhage. Grossly stable appearance of other bilateral cerebral hemorrhages allowing for differences in technique.  3. Waxing and waning cortical and subcortical T2 hyperintensities as above. Findings could represent PRES, vasculitis, or other inflammatory conditions.   MRI cervical spine 12/04/2013: 1. Negative cervical spine except for degenerative changes. No signal abnormality in the visualized spinal cord. 2.  Multifactorial mild spinal stenosis at C4-C5 and C5-C6. Multifactorial moderate to severe foraminal stenosis at the bilateral C6 and C7 nerve levels.  MRI brain wwo contrast 12/04/2013: 1. The patient has suffered a hemorrhage in the left amygdala since the scan in March, but now this is chronic. Given the constellation of numerous previous hemorrhages, some more macroscopic than microscopic, consider amyloid angiopathy. Also I note the presence of an IVC filter in this patient, alternatively is there a history of congenital coagulopathy? 2. There are areas of superficial siderosis over both convexities. 3. Virtually resolved cerebral edema which was associated with parenchymal hemorrhages in March. Underlying nonspecific white matter signal changes, not strongly suggestive of demyelinating disease and mild by comparison to the extent of chronic blood products.  Cerebral angiogram 02/10/2013: Mild focal areas of segmental caliber narrowing of the distal left pericallosal branches. These findings are nonspecific. Differential considerations would changes secondary to intracranial arteriosclerosis, vasospasm or vasculitis. Clinical correlation is recommended.  CSF 02/07/2013:  R21 W1 G59 P57,   IgG index 5.0, OCB neg CSF 05/29/2012:  R12  W1  G65 P48  IgG index 2.9 MBP <0.2, OCB neg    Cryptococcal neg, Lyme neg, VZV neg, ACE 5, cytology neg  Labs 02/05/2013:  ESR 15, RPR nonreactive, ANA neg, total complement >60*, C4 31, C3 128  Labs 04/21/2013:  CRP <0.5, ESR 1, SPEP/UPEP with IFE no M protein, ACE 23, celiac panel neg, ENA neg, cryoglobulin neg, ANCA neg, neocomplete paraneoplastic antibody testing negative  Hypercoagulable work-up:  antithrombin III, protein C, protein S, lupus anticoagulant, homocystine, factor 5 Leiden - wnl HIV, ACE - neg    IMPRESSION: 1. 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  He has underwent extensive work-up for ?PRES and vasculitis including cerebral angiogram and CSF testing x 2 which return normal. His most recent MRI brain from May 2016 is stable, without interval hemorrhages.  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 Definitive diagnosis is made by tissue biopsy, but has deferred this. Other considerations include CADASIL, but this seems to be less likely.  2. PE and DVT (situational) s/p IVC filter   4. History of atrial fibrillation with RVR, currently in NSR (not on anticoagulation)  5. Hypertension, on labetalol 285m BID and norvasc 134mdaily, well-controlled, followed by PCP  7. Diabetes mellitus, well-controlled, followed by PCP   PLAN/RECOMMENDATIONS:  Continue to minimize vascular risk factors with management hypertension and diabetes  Stay off aspirin to reduce bleeding risk, unless indicated for cardiac reasons   For seizures, refills for Keppra [id number] daily has been sent.  Repeat MRI brain wwo contrast in 9-m84-montheturn to clinic in 9-mo39-month sooner as needed.  If doing well at the next visit, can follow-up annually.   The duration of this appointment visit was 25 minutes of face-to-face time with the patient.  Greater than 50% of this time was spent in counseling, explanation of diagnosis, planning of further management, and coordination of care.   Thank you for allowing me to participate in patient's care.  If I can answer any additional questions, I would be pleased to do so.    Sincerely,    Donika K. PatelPosey Pronto

## 2014-07-14 IMAGING — RF DG FLUORO GUIDE LUMBAR PUNCTURE
1 series · 1 of 1 positions shown · non-contrast
Comparison: none

CLINICAL DATA: Seizures/convulsions with intracranial hemorrhage.
Need for lumbar puncture with failed attempt at bedside.

[Series 4: (person_name)^(person_name)^(person_name · 1 of 1 slices shown]
[im 1/1]
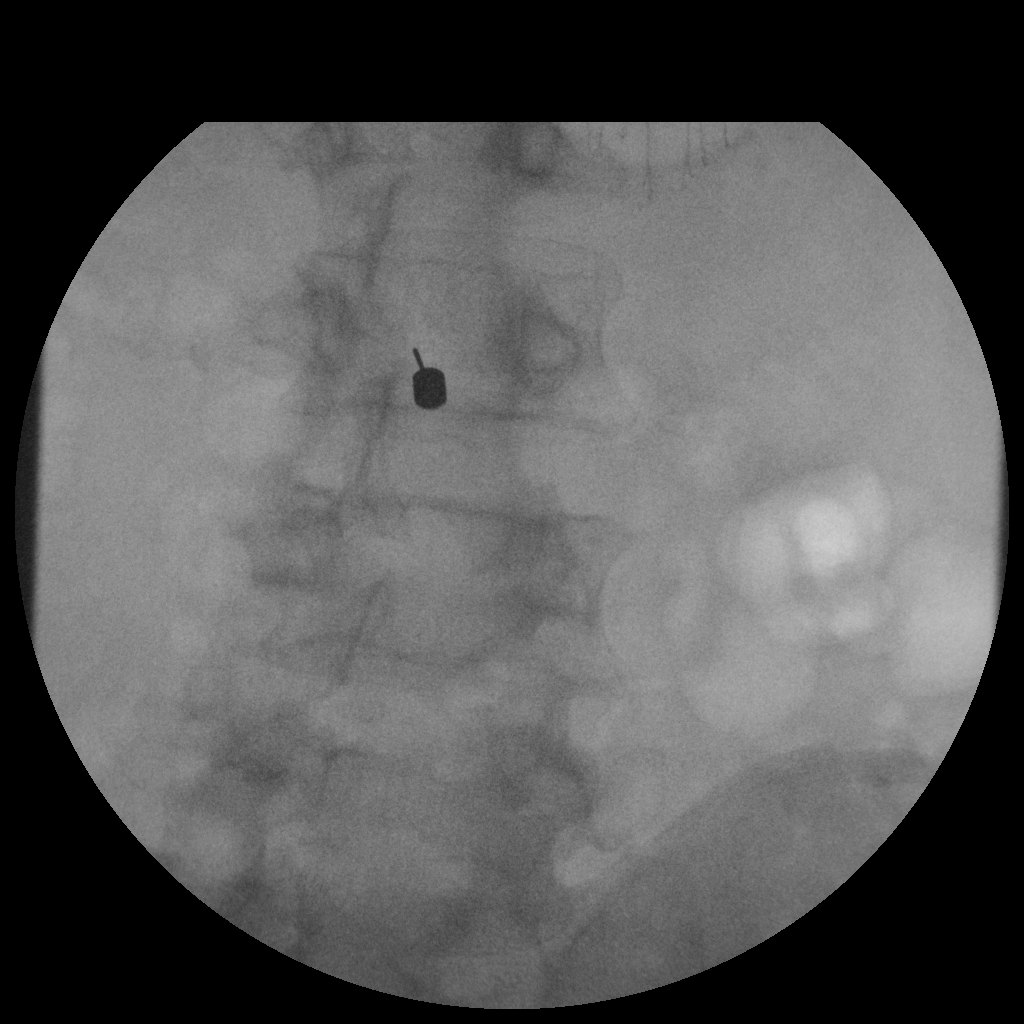

[1 of 1 positions shown; findings below may reference images not displayed]

EXAM:
DIAGNOSTIC LUMBAR PUNCTURE UNDER FLUOROSCOPIC GUIDANCE

FLUOROSCOPY TIME:  7 seconds.

PROCEDURE:
Time out procedure was performed. Informed consent was obtained from
the patient prior to the procedure, including potential
complications of headache, allergy, and pain. With the patient
prone, the lower back was prepped with Betadine. 1% Lidocaine was
used for local anesthesia. Lumbar puncture was performed at the
right L2-3 level using a 21 gauge needle with return of clear CSF
with an opening pressure of 10 cm water, measured prone through the
needle. 7.5ml of CSF were obtained for laboratory studies. The
patient tolerated the procedure well and there were no apparent
complications.
IMPRESSION: Diagnostic lumbar puncture performed without immediate
complications.

## 2014-10-06 IMAGING — RF DG FLUORO GUIDE LUMBAR PUNCTURE
3 series · 3 of 3 positions shown · non-contrast
Comparison: Brain MRI 04/10/2013 and lumbar puncture 02/07/2013.

CLINICAL DATA: 57-year-old male with abnormal brain MRI, subacute
in chronic cerebral hemorrhages. Cryptogenic signal abnormality. CSF
analysis requested to assist in diagnosis. Initial encounter.

EXAM:
DIAGNOSTIC LUMBAR PUNCTURE UNDER FLUOROSCOPIC GUIDANCE
FLUOROSCOPY TIME:  0 min and 30 seconds

[Series 1: run · 1 of 1 slices shown (1 of 3)]
[im 1/1]
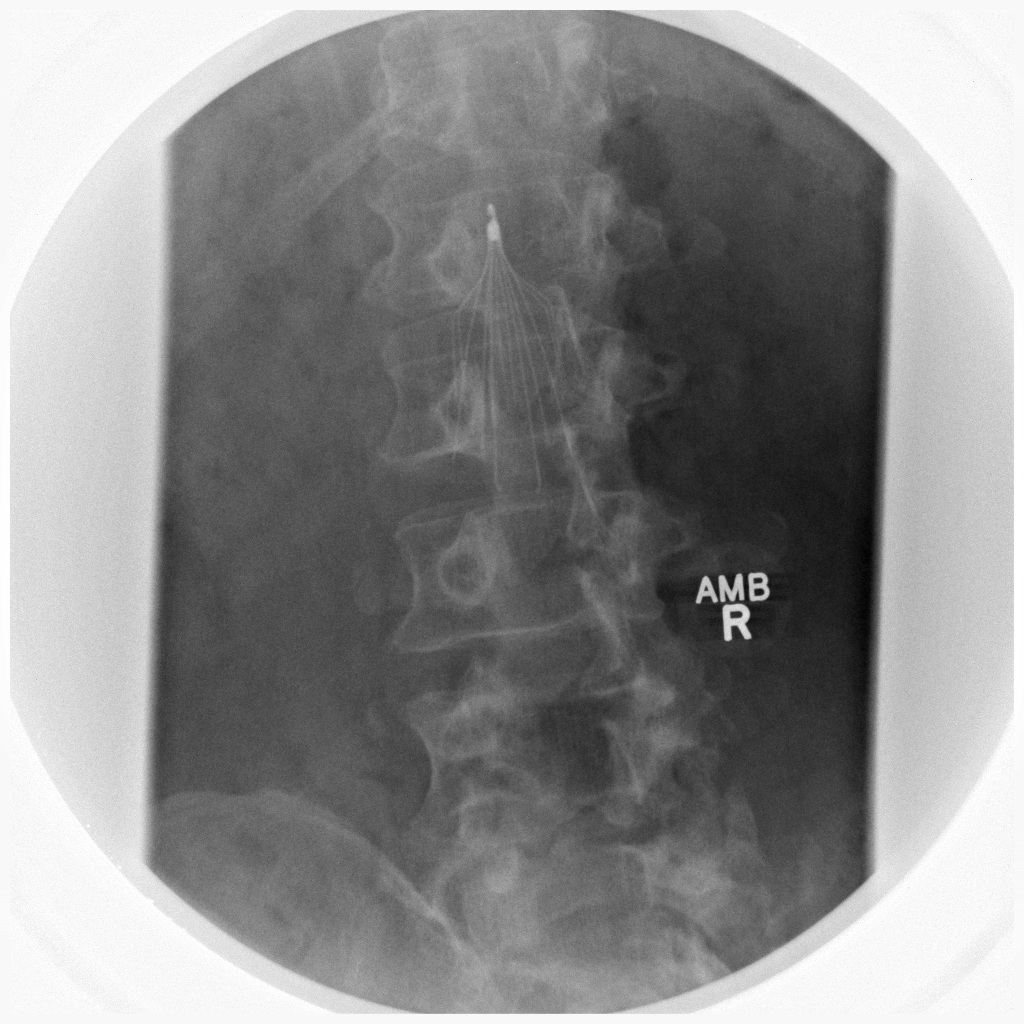

[Series 2: run · 1 of 1 slices shown (2 of 3)]
[im 1/1]
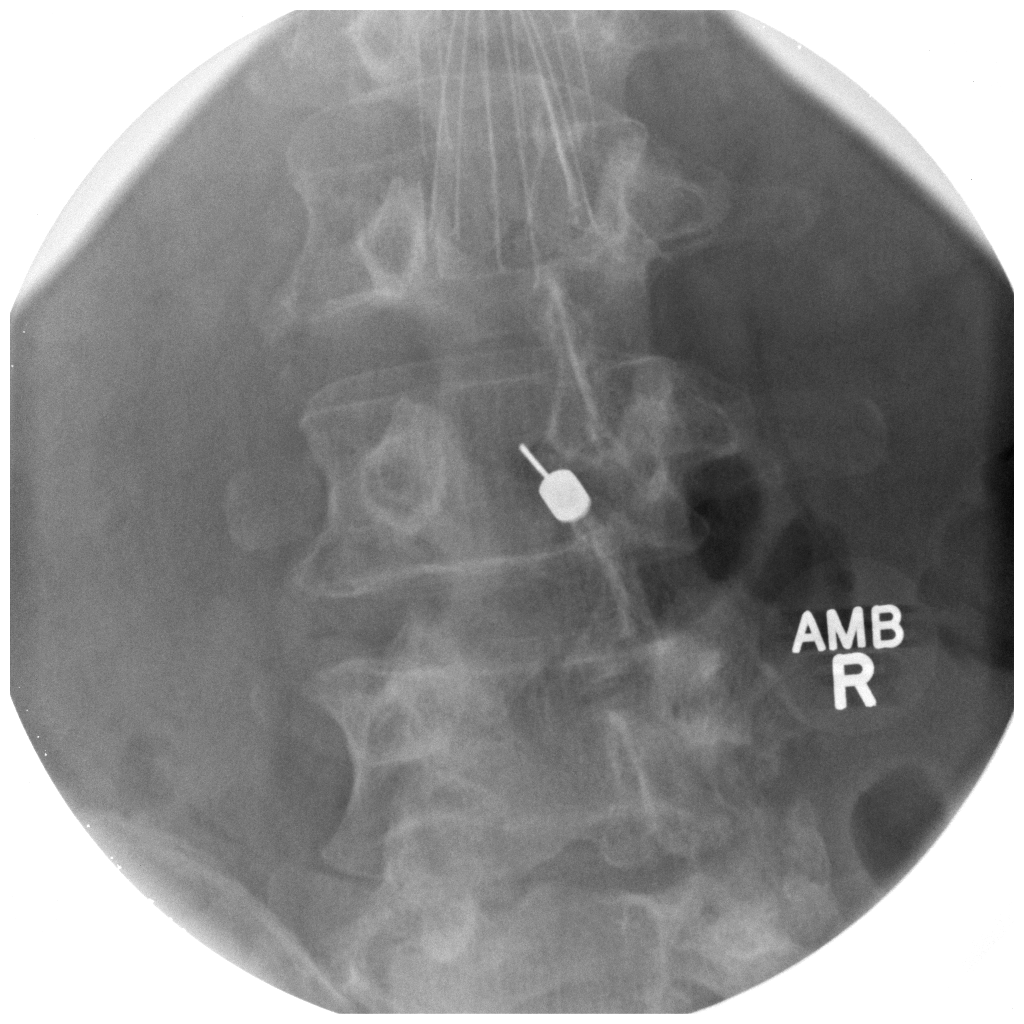

[Series 3: run · 1 of 1 slices shown (3 of 3)]
[im 1/1]
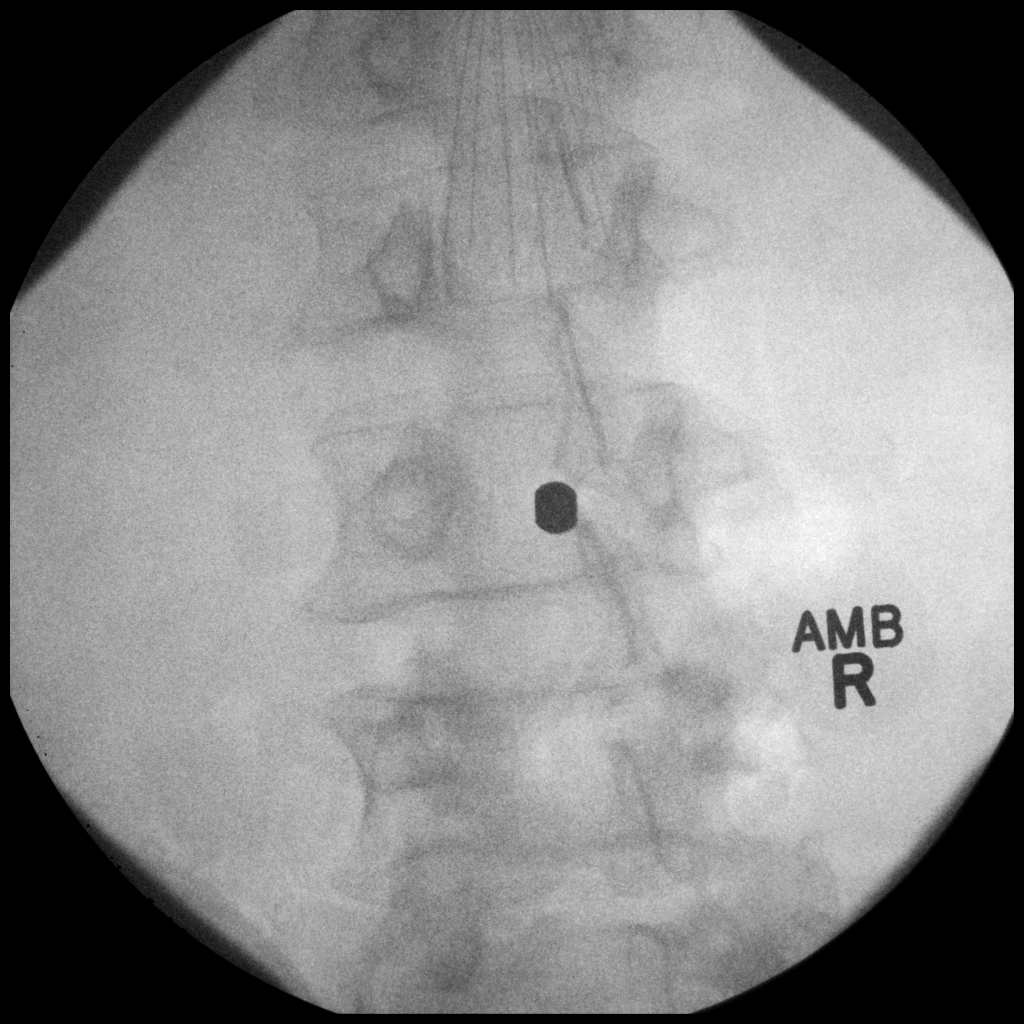

[3 of 3 positions shown; findings below may reference images not displayed]

PROCEDURE:
Informed consent was obtained from the patient prior to the
procedure, including potential complications of headache, allergy,
and pain. A "time-out" was performed.

With the patient prone, the lower back was prepped with Betadine. 1%
Lidocaine was used for local anesthesia. Lumbar puncture was
performed at the L2-L3 level using a 3.5 inch x 20 gauge needle with
return of initially blood tinged, ultimately clear CSF. 20ml of CSF
were obtained for laboratory studies. The patient tolerated the
procedure well and there were no apparent complications.

Appropriate postprocedure orders were placed on the chart, an the
patient was given postprocedure instructions. Plan is to discharge
to home after a period of observation.
IMPRESSION: Fluoroscopic guided lumbar puncture at L2-L3 with CSF fluid
collected and transported to the lab for analysis.

## 2015-01-11 ENCOUNTER — Telehealth: Payer: Self-pay | Admitting: *Deleted

## 2015-01-11 NOTE — Telephone Encounter (Signed)
Pharmacist from [facility] called requesting to fill Rx for patient's Keppra.  Patient is having trouble getting it from his mail order pharmacy.  1 month given until patient gets his Rx or switches pharmacies.

## 2015-03-11 ENCOUNTER — Inpatient Hospital Stay: Admission: RE | Admit: 2015-03-11 | Payer: 59 | Source: Ambulatory Visit

## 2015-03-17 ENCOUNTER — Other Ambulatory Visit: Payer: Self-pay | Admitting: *Deleted

## 2015-03-17 DIAGNOSIS — R569 Unspecified convulsions: Secondary | ICD-10-CM

## 2015-03-17 DIAGNOSIS — R9089 Other abnormal findings on diagnostic imaging of central nervous system: Secondary | ICD-10-CM

## 2015-03-17 DIAGNOSIS — G379 Demyelinating disease of central nervous system, unspecified: Secondary | ICD-10-CM

## 2015-03-24 ENCOUNTER — Other Ambulatory Visit: Payer: 59

## 2015-03-29 ENCOUNTER — Ambulatory Visit
Admission: RE | Admit: 2015-03-29 | Discharge: 2015-03-29 | Disposition: A | Discharge status: Home / Self Care | Payer: 59 | Source: Ambulatory Visit | Attending: Neurology | Admitting: Neurology

## 2015-03-29 DIAGNOSIS — R569 Unspecified convulsions: Secondary | ICD-10-CM

## 2015-03-29 DIAGNOSIS — R9089 Other abnormal findings on diagnostic imaging of central nervous system: Secondary | ICD-10-CM

## 2015-03-29 DIAGNOSIS — G379 Demyelinating disease of central nervous system, unspecified: Secondary | ICD-10-CM

## 2015-03-29 MED ORDER — GADOBENATE DIMEGLUMINE 529 MG/ML IV SOLN
20.0000 mL | Freq: Once | INTRAVENOUS | Status: AC | PRN
  Administered 2015-03-29: 20 mL via INTRAVENOUS

## 2015-04-09 ENCOUNTER — Encounter: Payer: Self-pay | Admitting: Neurology

## 2015-04-09 ENCOUNTER — Ambulatory Visit (INDEPENDENT_AMBULATORY_CARE_PROVIDER_SITE_OTHER): Payer: 59 | Admitting: Neurology

## 2015-04-09 ENCOUNTER — Other Ambulatory Visit: Payer: Self-pay | Admitting: *Deleted

## 2015-04-09 VITALS — BP 138/80 | HR 84 | Ht 69.0 in | Wt 214.5 lb

## 2015-04-09 DIAGNOSIS — R569 Unspecified convulsions: Secondary | ICD-10-CM

## 2015-04-09 DIAGNOSIS — E854 Organ-limited amyloidosis: Secondary | ICD-10-CM

## 2015-04-09 DIAGNOSIS — I680 Cerebral amyloid angiopathy: Principal | ICD-10-CM

## 2015-04-09 DIAGNOSIS — I10 Essential (primary) hypertension: Secondary | ICD-10-CM | POA: Diagnosis not present

## 2015-04-09 MED ORDER — LEVETIRACETAM 500 MG PO TABS: 500.0000 mg | ORAL_TABLET | Freq: Two times a day (BID) | ORAL | Status: DC

## 2015-04-09 NOTE — Progress Notes (Signed)
Follow-up Visit   Date: 04/09/2015    Juan Glenn MRN: [id number] DOB: 1955-06-23   Interim History: Juan Glenn is a 60 y.o. 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      History of present illness: He was admitted to [facility] on 8/7-8/9 where he was bought by ambulance after being found confused and unconscious at work. 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 Around 7:30pm, his vision started to get blurry and he felt nauseous so wanted to go to the bathroom. He is required to change into/out of a protective gown to prevent contamination of the electrical parts so was on his way to get his gown off and collapsed inside the gowning room. His co-workers said he acted disoriented before going to the bathroom. He was found unconscious with a bloody nose by his co-workers who activated EMS. He said his tongue was sore at the tip and there was blood. By the time he was transported to [facility], he was back to his baseline, including vision and mental status. He was then transferred to [facility] main campus for further evaluation. Blood pressure on arrival to [facility] ED was 139/80.   His work-up included EEG, MRI brain wwo contrast, CTA, echo, and laboratory resting. EEG was normal and MRI brain which showed abnormal patchy areas of T2 signal intensity involving bilateral temporal lobes, occipital lobes, parietal lobes, and left frontal lobe. Some of these areas demonstrate mild enhancement. Patient remained clinically stable and was discharged with out-patient follow-up. His PCP reduced the dose of his lisinopril to 60m which he takes for renal protection because he was concerned this medication may have contributed to his presentation of suspected PRES.    - Follow-up 10/24/2012: Lisinopril stopped due to concern of contributing to PRES. Repeat MRI brain on 10/09/2012 showed improving cortical and subcortical hyperintensities and without enhancement.  Two interim hospitalizations:    (1) 11/6-11/18: Admitted with seizures in the setting of hypertensive emergency required intubation for airway protection. He received Ativan 2 mg x2 and loaded with fosphenytoin. While in the ED, he was noted to have new onset atrial fibrillation with RVR. He is also treated for aspiration pneumonia and discharged on Levaquin. Blood pressure was optimized with labetalol 200 mg twice daily and norvasc 130m   (2) 11/19-12/03/2012: Patient presented to emergency room with respiratory failure and found to have bilateral multifocal pulmonary emboli and right sided DVT. He was intubated and on 11/19 and extubated 11/23. His hospital course was notable for pulmonary edema, hypernatremia, and respiratory insufficiency. He was started on Xeralto.  - Follow-up 01/21/2013: Clinically, the patient was doing very well. MRI of brain (12/31) was ordered given his recent hospitalization for seizures which showed several areas of white matter changes which had progressed since his previous MRI of brain in September 2014. In addition, there is a new left lobar subacute hematoma, which was not previously seen on his CT head dated 11/6. 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 Subacute hematoma may have been due elevated blood pressure and that he is on anticoagulation for PE/DVT.    - Follow-up 02/06/2013: Recently admitted for anticoagulation management and was found to have subacute intraparenchymal hematoma. During that admission, he had repeat MRI of the brain which showed microhemorrhages so anticoagulation was stopped.  He also has cerebral angiogram and lumbar puncture looking for CNS vasculitis. Results were nonrevealing. Alen Blew was stopped and he had IVC filter placement.    - Follow-up  3 - 12/2013:  Clinically, he is doing well. Interval MRI brain was done in March which shows waxing/waning cortical and subcortical white matter changes in areas that were previously uninvolved.  No new infarcts.  He underwent second LP and had additional lab testing for possible vasculitis, which returned unrevealing.  Paraneoplastic panel was negative.  Clinically he was doing great.    In 2016, he has no new issues.  Imaging remained stable with no new hemorrhages.    UPDATE 04/09/2015:  He reports doing great.  No interval illnesses, seizures, or hospitalizations.  MRI brain performed in February 2017 showed stable burden of chronic lobar hemorrhages.  He is complaint with Keppra 51m twice daily and denies skipping any dosages.   Medications:  Current Outpatient Prescriptions on File Prior to Visit  Medication Sig Dispense Refill  . amLODipine (NORVASC) 10 MG tablet Take 1 tablet (10 mg total) by mouth daily. 30 tablet   . glimepiride (AMARYL) 2 MG tablet Take 1 tablet (2 mg total) by mouth 2 (two) times daily with a meal. Breakfast and dinner 60 tablet 3  . glimepiride (AMARYL) 4 MG tablet     . glucose blood (ONE TOUCH ULTRA TEST) test strip TEST twice a day    . labetalol (NORMODYNE) 200 MG tablet Take 1 tablet (200 mg total) by mouth 2 (two) times daily. 60 tablet 3  . levETIRAcetam (KEPPRA) 500 MG tablet Take 1 tablet (500 mg total) by mouth 2 (two) times daily. 180 tablet 3  . Linagliptin-Metformin HCl (JENTADUETO) 2.05-998 MG TABS Take 1 tablet by mouth daily. 30 tablet 3  . Multiple Vitamin (MULTIVITAMIN WITH MINERALS) TABS tablet Take 1 tablet by mouth daily.    . ONE TOUCH LANCETS MISC     . ONE TOUCH ULTRA TEST test strip 2 (two) times daily. for testing  1  . pantoprazole (PROTONIX) 40 MG tablet TAKE 1 BY MOUTH DAILY    . rosuvastatin (CRESTOR) 10 MG tablet Take 10 mg by mouth daily.    . rosuvastatin (CRESTOR) 10 MG tablet Take 1 by mouth daily     No current  facility-administered medications on file prior to visit.    Allergies:  Allergies  Allergen Reactions  . Lisinopril Other (See Comments)    MD thinks that this medication caused patient to "pass out" "faint".  (blurred vision per spouse)     Review of Systems:  CONSTITUTIONAL: No fevers, chills, night sweats, or weight loss.   EYES: No visual changes or eye pain ENT: No hearing changes.  No history of nose bleeds.   RESPIRATORY: No cough, wheezing and shortness of breath.   CARDIOVASCULAR: Negative for chest pain, and palpitations.   GI: Negative for abdominal discomfort, blood in stools or black stools.  No recent change in bowel habits.   GU:  No history of incontinence.   MUSCLOSKELETAL: No history of joint pain or swelling.  No myalgias.   SKIN: Negative for lesions, rash, and itching.   ENDOCRINE: Negative for cold or heat intolerance, polydipsia or goiter.   PSYCH:  No depression or anxiety symptoms.   NEURO: As Above.   Vital Signs:  BP 138/80 mmHg  Pulse 84  Ht '5\' 9"'  (1.753 m)  Wt 214 lb 8 oz (97.297 kg)  BMI 31.66 kg/m2  SpO2 97%  Neurological Exam: MENTAL STATUS including orientation to time, place, person, recent and remote memory, attention span and concentration, language, and fund of knowledge is normal.  Speech is not dysarthric.  CRANIAL NERVES:  Visual fields intact.  Pupils equal round and reactive to light.  Normal conjugate, extra-ocular eye movements in all directions of gaze.  Mild left ptosis.  Face is symmetric. Palate elevates symmetrically.  MOTOR:  Motor strength is 5/5 in all extremities.  No pronator drift.  Tone is normal.    REFLEXES:  Brisk reflexes throughout 3+/4 and symmetric.    SENSORY:  Vibration intact throughout.  COORDINATION/GAIT:  Gait narrow based and stable.   Data: MRI brain 10/09/2012:  1. Improving cortical and subcortical T2 hyperintensities this.  2. Previously seen intravascular enhancement is no longer present.    3. This most likely represents resolving posterior reversible encephalopathy or H D E. No definite encephalomalacia is evident to confirm previous infarcts.  4. 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  MRI brain 01/29/2013: Progressed peripheral supratentorial gray-white matter FLAIR abnormality's, including left frontal lesion which shows faint  enhancement. In addition, new left posterior temporal 15 x 14 mm he subacute hematoma. Additional supratentorial micro hemorrhages. Constellation of findings may reflect chronic severe hypertension, with subsequent lobar hematoma. Scattered micro hemorrhages can also be seen with amyloid angiopathy. The FLAIR abnormality's may reflect superimposed PRES. Overall progression of disease process. No atrophy.  MRI brain 02/04/2013:  1. Possible punctate area of restricted diffusion in the anterior right frontal lobe subcortical white matter such as due to acute lacunar infarct. However, this might also be imaging artifact related to nearby chronic blood products.  2. No acute mass effect or new intracranial hemorrhage. Stable left temporal lobe hemorrhage. Advanced chronic underlying mostly micro hemorrhages in the brain elsewhere.  3. 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  4. Top differential considerations to consider in addition to PRES include an underlying coagulopathy (such as protein C deficiency or other hypercoagulable state) and chronic vasculitis such as due to connective tissue disorder.  MRI brain 04/10/2013: 1. No evidence of acute infarct.  2. Evolving left temporal lobe hemorrhage. Grossly stable appearance of other bilateral cerebral hemorrhages allowing for differences in technique.  3. Waxing and waning cortical and subcortical T2 hyperintensities as above. Findings could  represent PRES, vasculitis, or other inflammatory conditions.   MRI cervical spine 12/04/2013: 1. Negative cervical spine except for degenerative changes. No signal abnormality in the visualized spinal cord. 2. Multifactorial mild spinal stenosis at C4-C5 and C5-C6. Multifactorial moderate to severe foraminal stenosis at the bilateral C6 and C7 nerve levels.  MRI brain wwo contrast 12/04/2013: 1. The patient has suffered a hemorrhage in the left amygdala since the scan in March, but now this is chronic. Given the constellation of numerous previous hemorrhages, some more macroscopic than microscopic, consider amyloid angiopathy. Also I note the presence of an IVC filter in this patient, alternatively is there a history of congenital coagulopathy? 2. There are areas of superficial siderosis over both convexities. 3. Virtually resolved cerebral edema which was associated with parenchymal hemorrhages in March. Underlying nonspecific white matter signal changes, not strongly suggestive of demyelinating disease and mild by comparison to the extent of chronic blood products.  MRI brain wwo contrast 03/30/2015:  Stable burden of remote lobar hemorrhages most consistent with  amyloid angiopathy.  Cerebral  angiogram 02/10/2013: Mild focal areas of segmental caliber narrowing of the distal left pericallosal branches. These findings are nonspecific. Differential considerations would changes secondary to intracranial arteriosclerosis, vasospasm or vasculitis. Clinical correlation is recommended.  CSF 02/07/2013:  R21 W1 G59 P57,   IgG index 5.0, OCB neg CSF 05/29/2012:  R12  W1  G65 P48  IgG index 2.9 MBP <0.2, OCB neg    Cryptococcal neg, Lyme neg, VZV neg, ACE 5, cytology neg  Labs 02/05/2013:  ESR 15, RPR nonreactive, ANA neg, total complement >60*, C4 31, C3 128, HIV, ACE neg  Labs 04/21/2013:  CRP <0.5, ESR 1, SPEP/UPEP with IFE no M protein, ACE 23, celiac panel neg, ENA neg, cryoglobulin neg, ANCA neg,  neocomplete paraneoplastic antibody testing negative  Hypercoagulable work-up:  antithrombin III, protein C, protein S, lupus anticoagulant, homocystine, factor 5 [place] - wnl  IMPRESSION: 1. 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  He has underwent extensive work-up for ?PRES and vasculitis including cerebral angiogram and CSF testing x 2 which return normal. His most recent MRI brain from February 2017 was reviewed with patient and shows no interval hemorrhages.  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 Definitive diagnosis is made by tissue biopsy, but has deferred this. We discussed at length about the potential complications with CAA including risk of bleeding especially if he would need anticoagulant or antiplatelet therapy, but risks vs benefit would need to be weighed.    2. PE and DVT (situational) s/p IVC filter   3. Hypertension, on labetalol 276m BID and norvasc 127mdaily, well-controlled, followed by PCP  4. Diabetes mellitus, well-controlled, followed by PCP   PLAN/RECOMMENDATIONS:  Continue to minimize vascular risk factors with management hypertension and diabetes Stay off aspirin to reduce bleeding risk, unless indicated for cardiac reasons  For seizures, refills for Keppra 5006mwice daily has been sent.  Return to clinic in 1 year or sooner as needed  The duration of this appointment visit was 25 minutes of face-to-face time with the patient.  Greater than 50% of this time was spent in counseling, explanation of diagnosis, planning of further management, and coordination of care.   Thank you for allowing me to participate in patient's care.  If I can answer any additional questions, I would be pleased to do so.    Sincerely,    Kortny Lirette K. PatPosey ProntoO

## 2015-04-09 NOTE — Patient Instructions (Signed)
Continue to minimize vascular risk factors with management hypertension and diabetes  Stay off aspirin to reduce bleeding risk, unless indicated for cardiac reasons   For seizures, refills for Keppra 500mg  twice daily has been sent.  Return to clinic in 1 year or sooner as needed

## 2015-12-18 ENCOUNTER — Emergency Department (HOSPITAL_COMMUNITY)
Admission: EM | Admit: 2015-12-18 | Discharge: 2015-12-18 | Disposition: A | Discharge status: Home / Self Care | Payer: 59 | Attending: Physician Assistant | Admitting: Physician Assistant

## 2015-12-18 ENCOUNTER — Emergency Department (HOSPITAL_COMMUNITY): Payer: 59

## 2015-12-18 ENCOUNTER — Encounter (HOSPITAL_COMMUNITY): Payer: Self-pay | Admitting: Emergency Medicine

## 2015-12-18 DIAGNOSIS — G40909 Epilepsy, unspecified, not intractable, without status epilepticus: Secondary | ICD-10-CM | POA: Insufficient documentation

## 2015-12-18 DIAGNOSIS — N183 Chronic kidney disease, stage 3 (moderate): Secondary | ICD-10-CM | POA: Diagnosis not present

## 2015-12-18 DIAGNOSIS — Z7984 Long term (current) use of oral hypoglycemic drugs: Secondary | ICD-10-CM | POA: Diagnosis not present

## 2015-12-18 DIAGNOSIS — E1122 Type 2 diabetes mellitus with diabetic chronic kidney disease: Secondary | ICD-10-CM | POA: Diagnosis not present

## 2015-12-18 DIAGNOSIS — R569 Unspecified convulsions: Secondary | ICD-10-CM

## 2015-12-18 DIAGNOSIS — I129 Hypertensive chronic kidney disease with stage 1 through stage 4 chronic kidney disease, or unspecified chronic kidney disease: Secondary | ICD-10-CM | POA: Insufficient documentation

## 2015-12-18 LAB — CBC WITH DIFFERENTIAL/PLATELET
BASOS PCT: 0 %
Basophils Absolute: 0 10*3/uL (ref 0.0–0.1)
Eosinophils Absolute: 0.2 10*3/uL (ref 0.0–0.7)
Eosinophils Relative: 1 %
HCT: 44.2 % (ref 39.0–52.0)
Hemoglobin: 15.6 g/dL (ref 13.0–17.0)
LYMPHS ABS: 1.4 10*3/uL (ref 0.7–4.0)
Lymphocytes Relative: 9 %
MCH: 29.5 pg (ref 26.0–34.0)
MCHC: 35.3 g/dL (ref 30.0–36.0)
MCV: 83.7 fL (ref 78.0–100.0)
Monocytes Absolute: 1.1 10*3/uL — ABNORMAL HIGH (ref 0.1–1.0)
Monocytes Relative: 7 %
NEUTROS PCT: 83 %
Neutro Abs: 12.7 10*3/uL — ABNORMAL HIGH (ref 1.7–7.7)
PLATELETS: 154 10*3/uL (ref 150–400)
RBC: 5.28 MIL/uL (ref 4.22–5.81)
RDW: 12.7 % (ref 11.5–15.5)
WBC: 15.4 10*3/uL — ABNORMAL HIGH (ref 4.0–10.5)

## 2015-12-18 LAB — COMPREHENSIVE METABOLIC PANEL
ALBUMIN: 4.1 g/dL (ref 3.5–5.0)
ALT: 23 U/L (ref 17–63)
AST: 26 U/L (ref 15–41)
Alkaline Phosphatase: 43 U/L (ref 38–126)
Anion gap: 10 (ref 5–15)
BUN: 12 mg/dL (ref 6–20)
CO2: 19 mmol/L — ABNORMAL LOW (ref 22–32)
CREATININE: 1.47 mg/dL — AB (ref 0.61–1.24)
Calcium: 9.7 mg/dL (ref 8.9–10.3)
Chloride: 108 mmol/L (ref 101–111)
GFR calc Af Amer: 58 mL/min — ABNORMAL LOW (ref >60)
GFR calc non Af Amer: 50 mL/min — ABNORMAL LOW (ref >60)
Glucose, Bld: 215 mg/dL — ABNORMAL HIGH (ref 65–99)
Potassium: 3.6 mmol/L (ref 3.5–5.1)
Sodium: 137 mmol/L (ref 135–145)
Total Bilirubin: 0.6 mg/dL (ref 0.3–1.2)
Total Protein: 6.8 g/dL (ref 6.5–8.1)

## 2015-12-18 LAB — URINALYSIS, ROUTINE W REFLEX MICROSCOPIC
Bilirubin Urine: NEGATIVE
Ketones, ur: NEGATIVE mg/dL
Leukocytes, UA: NEGATIVE
Nitrite: NEGATIVE
PH: 5.5 (ref 5.0–8.0)
Protein, ur: 30 mg/dL — AB
Specific Gravity, Urine: 1.015 (ref 1.005–1.030)

## 2015-12-18 LAB — URINE MICROSCOPIC-ADD ON

## 2015-12-18 LAB — CBG MONITORING, ED: Glucose-Capillary: 249 mg/dL — ABNORMAL HIGH (ref 65–99)

## 2015-12-18 MED ORDER — SODIUM CHLORIDE 0.9 % IV SOLN
1000.0000 mg | Freq: Once | INTRAVENOUS | Status: AC
  Administered 2015-12-18: 1000 mg via INTRAVENOUS
  Filled 2015-12-18: qty 10

## 2015-12-18 NOTE — ED Notes (Signed)
ED Provider at bedside. 

## 2015-12-18 NOTE — ED Provider Notes (Signed)
[place] DEPT Provider Note   CSN: [id number] Arrival date & time: 12/18/15  1959     History   Chief Complaint Chief Complaint  Patient presents with  . Seizures    HPI Juan Glenn is a 59 y.o. male.  HPI   Patient is a 60 year old male presenting with seizure witnessed today. Patient has history of seizure disorder and prescribed Keppra.The last neurology note comments that he has a history of multiple recurrent intracranial hemorrhages and leukoencephalopathy of unknown etiology. Patient reports no prodrome. He does not remember the seizure. No family at bedside per currently.  Past Medical History:  Diagnosis Date  . Diabetes mellitus without complication ([place]) [id number]  . Difficult intubation   . Hyperlipidemia   . PRES (posterior reversible encephalopathy syndrome) 09/2012  . Pulmonary embolism ([place])   . Renal disorder     Patient Active Problem List   Diagnosis Date Noted  . Intracranial hematoma ([place]) 02/04/2013  . Hematoma 02/04/2013  . CKD (chronic kidney disease) stage 3, GFR 30-59 ml/min 12/24/2012  . Aspiration pneumonia ([place]) 12/24/2012  . Pulmonary embolism ([place]) 12/19/2012  . Acute respiratory failure ([place]) 12/05/2012  . ARDS (adult respiratory distress syndrome) ([place]) 12/05/2012  . HTN (hypertension), malignant 09/06/2012  . DM (diabetes mellitus), type 2 with renal complications ([place]) [id number]  . Convulsions/seizures ([place]) 09/06/2012  . Abnormal CT of brain 09/06/2012  . Syncope 09/06/2012    Past Surgical History:  Procedure Laterality Date  . no surgical history  09/2012       Home Medications    Prior to Admission medications   Medication Sig Start Date End Date Taking? Authorizing Provider  amLODipine (NORVASC) 10 MG tablet Take 1 tablet (10 mg total) by mouth daily. 01/01/13   Bobby Rumpf [place], PA-C  glimepiride (AMARYL) 2 MG tablet Take 1 tablet (2 mg total) by mouth 2 (two) times daily with a meal. Breakfast and dinner  01/01/13   Melton Alar, PA-C  glimepiride (AMARYL) 4 MG tablet  06/10/14   Historical Provider, MD  glucose blood (ONE TOUCH ULTRA TEST) test strip TEST twice a day 11/12/13   Historical Provider, MD  labetalol (NORMODYNE) 200 MG tablet Take 1 tablet (200 mg total) by mouth 2 (two) times daily. 12/18/12   Donita Brooks, NP  levETIRAcetam (KEPPRA) 500 MG tablet Take 1 tablet (500 mg total) by mouth 2 (two) times daily. 04/09/15   Donika K Patel, DO  Linagliptin-Metformin HCl (JENTADUETO) 2.05-998 MG TABS Take 1 tablet by mouth daily. 01/01/13   Melton Alar, PA-C  Multiple Vitamin (MULTIVITAMIN WITH MINERALS) TABS tablet Take 1 tablet by mouth daily.    Historical Provider, MD  ONE TOUCH LANCETS [place]  06/06/13   Historical Provider, MD  ONE TOUCH ULTRA TEST test strip 2 (two) times daily. for testing 11/12/13   Historical Provider, MD  pantoprazole (PROTONIX) 40 MG tablet TAKE 1 BY MOUTH DAILY 06/19/13   Historical Provider, MD  rosuvastatin (CRESTOR) 10 MG tablet Take 10 mg by mouth daily.    Historical Provider, MD  rosuvastatin (CRESTOR) 10 MG tablet Take 1 by mouth daily 06/10/14   Historical Provider, MD    Family History Family History  Problem Relation Age of Onset  . Diabetes Mother   . Congestive Heart Failure Mother     Died, 17s  . Heart attack Father     Died, 61  . Diabetes Brother   . Hypertension Sister   . Cancer Brother  Died, 81    Social History Social History  Substance Use Topics  . Smoking status: Never Smoker  . Smokeless tobacco: Never Used  . Alcohol use No     Allergies   Lisinopril   Review of Systems Review of Systems  Constitutional: Negative for activity change.  Respiratory: Negative for shortness of breath.   Cardiovascular: Negative for chest pain.  Gastrointestinal: Negative for abdominal pain.  Neurological: Positive for seizures. Negative for dizziness and light-headedness.  All other systems reviewed and are  negative.    Physical Exam Updated Vital Signs BP 145/89   Pulse 81   Temp 97.8 F (36.6 C) (Oral)   Resp 26   Ht 5\' 9"  (1.753 m)   Wt 215 lb (97.5 kg)   SpO2 97%   BMI 31.75 kg/m   Physical Exam  Constitutional: He is oriented to person, place, and time. He appears well-nourished.  HENT:  Head: Normocephalic.  Mouth/Throat: Oropharynx is clear and moist.  Eyes: Conjunctivae are normal.  Neck: No tracheal deviation present.  Cardiovascular: Normal rate.   Pulmonary/Chest: Effort normal. No stridor. No respiratory distress.  Abdominal: Soft. There is no tenderness. There is no guarding.  Musculoskeletal: Normal range of motion. He exhibits no edema.  Neurological: He is oriented to person, place, and time. No cranial nerve deficit.  Skin: Skin is warm and dry. No rash noted. He is not diaphoretic.  Psychiatric: He has a normal mood and affect. His behavior is normal.  Nursing note and vitals reviewed.    ED Treatments / Results  Labs (all labs ordered are listed, but only abnormal results are displayed) Labs Reviewed  COMPREHENSIVE METABOLIC PANEL - Abnormal; Notable for the following:       Result Value   CO2 19 (*)    Glucose, Bld 215 (*)    Creatinine, Ser 1.47 (*)    GFR calc non Af Amer 50 (*)    GFR calc Af Amer 58 (*)    All other components within normal limits  CBC WITH DIFFERENTIAL/PLATELET - Abnormal; Notable for the following:    WBC 15.4 (*)    Neutro Abs 12.7 (*)    Monocytes Absolute 1.1 (*)    All other components within normal limits  URINALYSIS, ROUTINE W REFLEX MICROSCOPIC (NOT AT [facility]) - Abnormal; Notable for the following:    Glucose, UA >1000 (*)    Hgb urine dipstick SMALL (*)    Protein, ur 30 (*)    All other components within normal limits  URINE MICROSCOPIC-ADD ON - Abnormal; Notable for the following:    Squamous Epithelial / LPF 0-5 (*)    Bacteria, UA RARE (*)    All other components within normal limits  CBG MONITORING, ED -  Abnormal; Notable for the following:    Glucose-Capillary 249 (*)    All other components within normal limits    EKG  EKG Interpretation None       Radiology Dg Chest 2 View  Result Date: 12/18/2015 CLINICAL DATA:  Seizure tonight EXAM: CHEST  2 VIEW COMPARISON:  12/29/2012 FINDINGS: The lungs are clear. The pulmonary vasculature is normal. Heart size is normal. Hilar and mediastinal contours are unremarkable. There is no pleural effusion. IMPRESSION: No active cardiopulmonary disease. Electronically Signed   By: Andreas Newport M.D.   On: 12/18/2015 22:08   Ct Head Wo Contrast  Result Date: 12/18/2015 CLINICAL DATA:  History of hypertension and posterior reversible encephalopathy syndrome. Seizure with fall. EXAM:  CT HEAD WITHOUT CONTRAST TECHNIQUE: Contiguous axial images were obtained from the base of the skull through the vertex without intravenous contrast. COMPARISON:  December 05, 2012 FINDINGS: Brain: No subdural, epidural, or subarachnoid hemorrhage. Encephalomalacia along the undersurface of the right occipital lobe is stable. No other sites of ischemia or infarct. The ventricles and sulci are normal. The cerebellum, brainstem, and basal cisterns are normal. No mass, mass effect, or midline shift. Vascular: No hyperdense vessel or unexpected calcification. Skull: Normal. Negative for fracture or focal lesion. Sinuses/Orbits: No acute finding. Other: None. IMPRESSION: Stable encephalomalacia along the undersurface of the right occipital lobe. No acute intracranial abnormality. Electronically Signed   By: Dorise Bullion III M.D   On: 12/18/2015 22:27    Procedures Procedures (including critical care time)  Medications Ordered in ED Medications  levETIRAcetam (KEPPRA) 1,000 mg in sodium chloride 0.9 % 100 mL IVPB (0 mg Intravenous Stopped 12/18/15 2248)     Initial Impression / Assessment and Plan / ED Course  I have reviewed the triage vital signs and the nursing  notes.  Pertinent labs & imaging results that were available during my care of the patient were reviewed by me and considered in my medical decision making (see chart for details).  Clinical Course    Pt is a well-appearing 60 year old male presenting after witnessed seizure. Patient has history of seizure disorder and idiopathic intracranial hemorrhage and leukoencephalopathy. Patient is at baseline now. He has no physical exam abnormalities. Will load patient with Keppra. He has not been taking it. However given his history we'll do CT scan and rule out any kind of other infection that could be lowering his seizure threshold. We'll have patient  start to continue back on his Keppra and follow up with neurology as soon as possible. (has keppra pills at home)   Pt and wife and sister at bedside and agree with plan. Gave Keppra here and he will call outaptient neurologist this week.   Final Clinical Impressions(s) / ED Diagnoses   Final diagnoses:  Seizure [facility])    New Prescriptions Discharge Medication List as of 12/18/2015 11:37 PM       Courteney Julio Alm, MD 12/19/15 2239

## 2015-12-18 NOTE — Discharge Instructions (Signed)
You were seen today with a seizure. We will believe this is part of your seizure disorder. Please take her Keppra as prescribed. Follow up ASAP with your neurologist. Your CT and labs are all reassuring. Creatinine shows a mild elevation in you should probably drink plenty of fluids and follow-up for a repeat at your PCP.

## 2015-12-18 NOTE — ED Notes (Signed)
Pt transported to xray 

## 2015-12-18 NOTE — ED Triage Notes (Signed)
Per EMS, pt had a witnessed seizure of unknown amount of time.  Pt was sitting on bed, fell to floor, family denies he hit his head.  Admitted that he stopped taking the [place] b/c "it was making have hallucination or something."  Was postictal upon EMS arrival, A/O upon arrival at hospital.  12 lead unremarkable and denies incontinence.

## 2015-12-18 NOTE — ED Notes (Signed)
Pt using urinal at this time.

## 2015-12-18 NOTE — ED Notes (Signed)
Checked CBG 249, RN Anderson Malta informed

## 2015-12-20 ENCOUNTER — Telehealth: Payer: Self-pay | Admitting: Neurology

## 2015-12-20 ENCOUNTER — Other Ambulatory Visit: Payer: Self-pay | Admitting: *Deleted

## 2015-12-20 DIAGNOSIS — R569 Unspecified convulsions: Secondary | ICD-10-CM

## 2015-12-20 NOTE — Telephone Encounter (Signed)
I spoke with patient and he said that while he is watching TV the faces of people look distorted.  He is wondering if he needs MRI.  If so, he would like it done before the end of the year since he will be changing insurances.  ED notes from the 18th said that he has had recent seizures.

## 2015-12-20 NOTE — Telephone Encounter (Signed)
Yes, please order MRI brain wwo contrast.  Thanks.

## 2015-12-20 NOTE — Telephone Encounter (Signed)
Order placed for MRI.  Patient notified and will call to set up the appointment.

## 2015-12-20 NOTE — Telephone Encounter (Signed)
Md. Hoos Apr 29, 2055. He was calling in needing to see if he could talk to Dr. Posey Pronto or you about some things. He asked could he come in today, but I told him that she did have a booked schedule today. His # is [id number]. Thank you

## 2015-12-25 ENCOUNTER — Ambulatory Visit
Admission: RE | Admit: 2015-12-25 | Discharge: 2015-12-25 | Disposition: A | Discharge status: Home / Self Care | Payer: 59 | Source: Ambulatory Visit | Attending: Neurology | Admitting: Neurology

## 2015-12-25 DIAGNOSIS — R569 Unspecified convulsions: Secondary | ICD-10-CM

## 2015-12-25 MED ORDER — GADOBENATE DIMEGLUMINE 529 MG/ML IV SOLN
20.0000 mL | Freq: Once | INTRAVENOUS | Status: AC | PRN
  Administered 2015-12-25: 20 mL via INTRAVENOUS

## 2016-02-22 ENCOUNTER — Telehealth: Payer: Self-pay | Admitting: Neurology

## 2016-02-22 NOTE — Telephone Encounter (Signed)
Juan Glenn 07-14-2055. His # [phone] [id number]. He was calling due to vision changes and wanted to know what might be causing them. Thank you

## 2016-02-22 NOTE — Telephone Encounter (Signed)
Patient has been having some visual disturbances lately.  Sometimes when he is watching TV, faces get distorted.  He has recently had an MRI and an eye exam.  He was wondering if it could be a side effect of his medication.  Please advise.

## 2016-02-22 NOTE — Telephone Encounter (Signed)
This is not a side effect of his medication.  It would be easier to discuss this in the clinic - let's have him move his March f/u to a sooner date.    Raylen Ken K. Posey Pronto, DO

## 2016-02-23 NOTE — Telephone Encounter (Signed)
Patient coming in on February 6 at 8:00.

## 2016-03-07 ENCOUNTER — Ambulatory Visit (INDEPENDENT_AMBULATORY_CARE_PROVIDER_SITE_OTHER): Payer: 59 | Admitting: Neurology

## 2016-03-07 ENCOUNTER — Encounter: Payer: Self-pay | Admitting: Neurology

## 2016-03-07 VITALS — BP 124/78 | HR 79 | Ht 69.0 in | Wt 216.5 lb

## 2016-03-07 DIAGNOSIS — R569 Unspecified convulsions: Secondary | ICD-10-CM

## 2016-03-07 DIAGNOSIS — I680 Cerebral amyloid angiopathy: Secondary | ICD-10-CM

## 2016-03-07 DIAGNOSIS — E854 Organ-limited amyloidosis: Secondary | ICD-10-CM

## 2016-03-07 DIAGNOSIS — H5319 Other subjective visual disturbances: Secondary | ICD-10-CM

## 2016-03-07 MED ORDER — LEVETIRACETAM 500 MG PO TABS: 500.0000 mg | ORAL_TABLET | Freq: Two times a day (BID) | ORAL | 3 refills | Status: DC

## 2016-03-07 NOTE — Patient Instructions (Addendum)
1.  Continue Keppra 500mg  twice daily 2.  Please call my office if you develop any more visual changes  Return to clinic in 6 months

## 2016-03-07 NOTE — Progress Notes (Signed)
Follow-up Visit   Date: 03/07/16    Juan Glenn MRN: [id number] DOB: [birth year]   Interim History: Juan Glenn is a 61 y.o. right-handed African [place] male with history of hypertension, diabetes mellitus, seizure disorder, situational DVT/PE s/p IVC filter returning to the clinic for follow-up of cerebral amyloid angiopathy and seizure disorder.     History of present illness: He was admitted to [facility] on 8/7-8/9 where he was bought by ambulance after being found confused and unconscious at work. 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 Around 7:30pm, his vision started to get blurry and he felt nauseous so wanted to go to the bathroom. He is required to change into/out of a protective gown to prevent contamination of the electrical parts so was on his way to get his gown off and collapsed inside the gowning room. His co-workers said he acted disoriented before going to the bathroom. He was found unconscious with a bloody nose by his co-workers who activated EMS. He said his tongue was sore at the tip and there was blood. By the time he was transported to [facility], he was back to his baseline, including vision and mental status. He was then transferred to [facility] main campus for further evaluation. Blood pressure on arrival to [facility] ED was 139/80.   His work-up included EEG, MRI brain wwo contrast, CTA, echo, and laboratory resting. EEG was normal and MRI brain which showed abnormal patchy areas of T2 signal intensity involving bilateral temporal lobes, occipital lobes, parietal lobes, and left frontal lobe. Some of these areas demonstrate mild enhancement. Patient remained clinically stable and was discharged with out-patient follow-up. His PCP reduced the dose of his lisinopril to 16m which he takes for renal protection because he was concerned this medication may have contributed to his presentation of suspected PRES.   - Follow-up  10/24/2012: Lisinopril stopped due to concern of contributing to PRES. Repeat MRI brain on 10/09/2012 showed improving cortical and subcortical hyperintensities and without enhancement.  Two interim hospitalizations:    (1) 11/6-11/18: Admitted with seizures in the setting of hypertensive emergency required intubation for airway protection. He received Ativan 2 mg x2 and loaded with fosphenytoin. While in the ED, he was noted to have new onset atrial fibrillation with RVR. He is also treated for aspiration pneumonia and discharged on Levaquin. Blood pressure was optimized with labetalol 200 mg twice daily and norvasc 137m   (2) 11/19-12/03/2012: Patient presented to emergency room with respiratory failure and found to have bilateral multifocal pulmonary emboli and right sided DVT. He was intubated and on 11/19 and extubated 11/23. His hospital course was notable for pulmonary edema, hypernatremia, and respiratory insufficiency. He was started on Xeralto.  - Follow-up 01/21/2013: Clinically, the patient was doing very well. MRI of brain (12/31) was ordered given his recent hospitalization for seizures which showed several areas of white matter changes which had progressed since his previous MRI of brain in September 2014. In addition, there is a new left lobar subacute hematoma, which was not previously seen on his CT head dated 11/6. 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 Subacute hematoma may have been due elevated blood pressure and that he is on anticoagulation for PE/DVT.    - Follow-up 02/06/2013: Recently admitted for anticoagulation management and was found to have subacute intraparenchymal hematoma. During that admission, he had repeat MRI of the brain which showed microhemorrhages so anticoagulation was stopped. He also has  cerebral angiogram and lumbar puncture looking for CNS vasculitis. Results were nonrevealing. Alen Blew was stopped and he had IVC filter placement.    - Follow-up 3 - 12/2013:   Clinically, he is doing well. Interval MRI brain was done in March which shows waxing/waning cortical and subcortical white matter changes in areas that were previously uninvolved.  No new infarcts.  He underwent second LP and had additional lab testing for possible vasculitis, which returned unrevealing.  Paraneoplastic panel was negative.  Clinically he was doing great.    In 2016, he has no new issues.  Imaging remained stable with no new hemorrhages.    UPDATE 04/09/2015:  He reports doing great.  No interval illnesses, seizures, or hospitalizations.  MRI brain performed in February 2017 showed stable burden of chronic lobar hemorrhages.  He is complaint with Keppra 598m twice daily and denies skipping any dosages.   UPDATE 03/08/2015:   In early November, he had one brief spell of visual changes, described as distorted faces, but would only be that was when looking at the TV screen.  He was concerned that this was a side effect of his medication, so stopped his Keppra.  One week later, he had a witnessed generalized tonic clonis seizure and was taken to the ER.  CT head was normal and he was instructed to restart his Keppra 5046mtwice daily.  He has been compliant with his medication and has not had any GTC seizures, but does report to having one more spell of visual distortion, lasting 30-minutes in January, again only with faces on the TV.  He tried closing one eye and noticed that it was present in both eyes.  When he looked at his wife or himself in the mirror, the images were normal.  He has not had any further spells.   Medications:  Current Outpatient Prescriptions on File Prior to Visit  Medication Sig Dispense Refill  . amLODipine (NORVASC) 10 MG tablet Take 1 tablet (10 mg total) by mouth daily. 30 tablet   . glimepiride (AMARYL) 2 MG tablet Take 1 tablet (2 mg total) by mouth 2 (two) times daily with a meal. Breakfast and dinner 60 tablet 3  . glimepiride (AMARYL) 4 MG tablet     .  glucose blood (ONE TOUCH ULTRA TEST) test strip TEST twice a day    . labetalol (NORMODYNE) 200 MG tablet Take 1 tablet (200 mg total) by mouth 2 (two) times daily. 60 tablet 3  . Linagliptin-Metformin HCl (JENTADUETO) 2.05-998 MG TABS Take 1 tablet by mouth daily. 30 tablet 3  . Multiple Vitamin (MULTIVITAMIN WITH MINERALS) TABS tablet Take 1 tablet by mouth daily.    . ONE TOUCH LANCETS MISC     . ONE TOUCH ULTRA TEST test strip 2 (two) times daily. for testing  1  . pantoprazole (PROTONIX) 40 MG tablet TAKE 1 BY MOUTH DAILY    . rosuvastatin (CRESTOR) 10 MG tablet Take 10 mg by mouth daily.     No current facility-administered medications on file prior to visit.     Allergies:  Allergies  Allergen Reactions  . Lisinopril Other (See Comments)    Light-headedness, possible syncope, MD thinks that this medication caused patient to "pass out" "faint".  (blurred vision per spouse)     Review of Systems:  CONSTITUTIONAL: No fevers, chills, night sweats, or weight loss.   EYES: No visual changes or eye pain ENT: No hearing changes.  No history of nose bleeds.  RESPIRATORY: No cough, wheezing and shortness of breath.   CARDIOVASCULAR: Negative for chest pain, and palpitations.   GI: Negative for abdominal discomfort, blood in stools or black stools.  No recent change in bowel habits.   GU:  No history of incontinence.   MUSCLOSKELETAL: No history of joint pain or swelling.  No myalgias.   SKIN: Negative for lesions, rash, and itching.   ENDOCRINE: Negative for cold or heat intolerance, polydipsia or goiter.   PSYCH:  No depression or anxiety symptoms.   NEURO: As Above.   Vital Signs:  BP 124/78   Pulse 79   Ht _0  (1.753 m)   Wt 216 lb 8 oz (98.2 kg)   SpO2 97%   BMI 31.97 kg/m    Neurological Exam: MENTAL STATUS including orientation to time, place, person, recent and remote memory, attention span and concentration, language, and fund of knowledge is normal.  Speech is  not dysarthric.  CRANIAL NERVES:  Visual fields intact.  Pupils equal round and reactive to light.  Normal conjugate, extra-ocular eye movements in all directions of gaze.  Mild left ptosis.  Face is symmetric. Palate elevates symmetrically.  MOTOR:  Motor strength is 5/5 in all extremities.    COORDINATION/GAIT:  Gait narrow based and stable.   Data: MRI brain 10/09/2012:  1. Improving cortical and subcortical T2 hyperintensities this.  2. Previously seen intravascular enhancement is no longer present.  3. This most likely represents resolving posterior reversible encephalopathy or H D E. No definite encephalomalacia is evident to confirm previous infarcts.  4. 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  MRI brain 01/29/2013: Progressed peripheral supratentorial gray-white matter FLAIR abnormality's, including left frontal lesion which shows faint  enhancement. In addition, new left posterior temporal 15 x 14 mm he subacute hematoma. Additional supratentorial micro hemorrhages. Constellation of findings may reflect chronic severe hypertension, with subsequent lobar hematoma. Scattered micro hemorrhages can also be seen with amyloid angiopathy. The FLAIR abnormality's may reflect superimposed PRES. Overall progression of disease process. No atrophy.  MRI brain 02/04/2013:  1. Possible punctate area of restricted diffusion in the anterior right frontal lobe subcortical white matter such as due to acute lacunar infarct. However, this might also be imaging artifact related to nearby chronic blood products.  2. No acute mass effect or new intracranial hemorrhage. Stable left temporal lobe hemorrhage. Advanced chronic underlying mostly micro hemorrhages in the brain elsewhere.  3. 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  4. Top differential  considerations to consider in addition to PRES include an underlying coagulopathy (such as protein C deficiency or other hypercoagulable state) and chronic vasculitis such as due to connective tissue disorder.  MRI brain 04/10/2013: 1. No evidence of acute infarct.  2. Evolving left temporal lobe hemorrhage. Grossly stable appearance of other bilateral cerebral hemorrhages allowing for differences in technique.  3. Waxing and waning cortical and subcortical T2 hyperintensities as above. Findings could represent PRES, vasculitis, or other inflammatory conditions.   MRI cervical spine 12/04/2013: 1. Negative cervical spine except for degenerative changes. No signal abnormality in the visualized spinal cord. 2. Multifactorial mild spinal stenosis at C4-C5 and C5-C6. Multifactorial moderate to severe foraminal stenosis at the bilateral C6 and C7 nerve levels.  MRI brain wwo contrast 12/04/2013: 1. The patient has suffered a hemorrhage in the left amygdala since the scan in March, but  now this is chronic. Given the constellation of numerous previous hemorrhages, some more macroscopic than microscopic, consider amyloid angiopathy. Also I note the presence of an IVC filter in this patient, alternatively is there a history of congenital coagulopathy? 2. There are areas of superficial siderosis over both convexities. 3. Virtually resolved cerebral edema which was associated with parenchymal hemorrhages in March. Underlying nonspecific white matter signal changes, not strongly suggestive of demyelinating disease and mild by comparison to the extent of chronic blood products.  MRI brain wwo contrast 03/30/2015:  Stable burden of remote lobar hemorrhages most consistent with  amyloid angiopathy.  MRI brain wwo contrast 12/25/2015: 1. No acute intracranial abnormality and essentially stable MRI appearance of the brain since May 2016. 2. Hippocampal formations appear symmetric and normal aside from possible  subtle T2 hyperintensity on the left such as due to recent seizure activity. 3. Extensive chronic parenchymal hemorrhages again noted, most overtly in the right occipital lobe where there is an area of cortical encephalomalacia. As noted in 2015 a fair amount of hemosiderin appears to be superficial/subarachnoid, although discontinuous and un-like the typical appearance of superficial siderosis. As before top differential considerations include amyloid angiopathy, vasculitis, and hypercoagulable state.  Cerebral angiogram 02/10/2013: Mild focal areas of segmental caliber narrowing of the distal left pericallosal branches. These findings are nonspecific. Differential considerations would changes secondary to intracranial arteriosclerosis, vasospasm or vasculitis. Clinical correlation is recommended.  CSF 02/07/2013:  R21 W1 G59 P57,   IgG index 5.0, OCB neg CSF 05/29/2012:  R12  W1  G65 P48  IgG index 2.9 MBP <0.2, OCB neg    Cryptococcal neg, Lyme neg, VZV neg, ACE 5, cytology neg  Labs 02/05/2013:  ESR 15, RPR nonreactive, ANA neg, total complement >60*, C4 31, C3 128, HIV, ACE neg  Labs 04/21/2013:  CRP <0.5, ESR 1, SPEP/UPEP with IFE no M protein, ACE 23, celiac panel neg, ENA neg, cryoglobulin neg, ANCA neg, neocomplete paraneoplastic antibody testing negative  Hypercoagulable work-up:  antithrombin III, protein C, protein S, lupus anticoagulant, homocystine, factor 5 Leiden - wnl   IMPRESSION: 1. 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  He has underwent extensive work-up for ?PRES and vasculitis including cerebral angiogram and CSF testing x 2 which return normal. His most recent MRI brain from November 2017 was reviewed and shows no interval hemorrhages. There was note of a subtle T2 hyperintensity in the left hippocampus, likely due to seizure activity. Definitive diagnosis is  made by tissue biopsy, but has deferred this. We discussed at length about the potential complications with CAA including risk of bleeding especially if he would need anticoagulant or antiplatelet therapy, but risks vs benefit would need to be weighed.    2.  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  He has only had two spells and would like to monitor.  If he develops more spells, we will need to increase Keppra 786m twice daily, but for now will continue to monitor him on Keppra 5066mtwice daily.   3. Hypertension, on labetalol 20042mID and norvasc 67m24mily, well-controlled, followed by PCP  4. Diabetes mellitus, well-controlled, followed by PCP   PLAN/RECOMMENDATIONS:  1.  Continue Keppra 500mg46mce daily 2.  If he develops new visual distortions, will need to increase Keppra to 750mg 88me daily 3.  Continue to minimize vascular risk factors with management hypertension and diabetes 4.  Stay off aspirin  to reduce bleeding risk, unless indicated for cardiac reasons   Return to clinic in 6 months  The duration of this appointment visit was 25 minutes of face-to-face time with the patient.  Greater than 50% of this time was spent in counseling, explanation of diagnosis, planning of further management, and coordination of care.   Thank you for allowing me to participate in patient's care.  If I can answer any additional questions, I would be pleased to do so.    Sincerely,    Donika K. Posey Pronto, DO

## 2016-04-10 ENCOUNTER — Ambulatory Visit: Payer: 59 | Admitting: Neurology

## 2016-05-07 DIAGNOSIS — D126 Benign neoplasm of colon, unspecified: Secondary | ICD-10-CM | POA: Insufficient documentation

## 2016-09-04 ENCOUNTER — Encounter: Payer: Self-pay | Admitting: Neurology

## 2016-09-04 ENCOUNTER — Ambulatory Visit (INDEPENDENT_AMBULATORY_CARE_PROVIDER_SITE_OTHER): Payer: PRIVATE HEALTH INSURANCE | Admitting: Neurology

## 2016-09-04 VITALS — BP 140/80 | HR 81 | Ht 69.0 in | Wt 227.2 lb

## 2016-09-04 DIAGNOSIS — I680 Cerebral amyloid angiopathy: Secondary | ICD-10-CM

## 2016-09-04 DIAGNOSIS — R569 Unspecified convulsions: Secondary | ICD-10-CM

## 2016-09-04 DIAGNOSIS — E854 Organ-limited amyloidosis: Secondary | ICD-10-CM

## 2016-09-04 MED ORDER — LEVETIRACETAM 500 MG PO TABS: 500.0000 mg | ORAL_TABLET | Freq: Two times a day (BID) | ORAL | 3 refills | Status: DC

## 2016-09-04 NOTE — Progress Notes (Signed)
Follow-up Visit   Date: 09/04/16    Gamble Enderle MRN: [id number] DOB: 1955/02/08   Interim History: Kesean Serviss is a 61 y.o. right-handed African [place] male with history of hypertension, diabetes mellitus, cerebral amyloid angiopathy, seizure disorder, and  situational DVT/PE s/p IVC filter returning to the clinic for follow-up.  History of present illness: He was admitted to [facility] on 8/7-09/07/2012 where he was bought by ambulance after being found confused and unconscious at work. 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 Around 7:30pm, his vision started to get blurry and he felt nauseous so wanted to go to the bathroom. He is required to change into/out of a protective gown to prevent contamination of the electrical parts so was on his way to get his gown off and collapsed inside the gowning room. His co-workers said he acted disoriented before going to the bathroom. He was found unconscious with a bloody nose by his co-workers who activated EMS. He said his tongue was sore at the tip and there was blood. By the time he was transported to [facility], he was back to his baseline, including vision and mental status. He was then transferred to [facility] main campus for further evaluation. Blood pressure on arrival to [facility] ED was 139/80.   His work-up included EEG, MRI brain wwo contrast, CTA, echo, and laboratory resting. EEG was normal and MRI brain which showed abnormal patchy areas of T2 signal intensity involving bilateral temporal lobes, occipital lobes, parietal lobes, and left frontal lobe. Some of these areas demonstrate mild enhancement. Patient remained clinically stable and was discharged with out-patient follow-up. His PCP reduced the dose of his lisinopril to 96m which he takes for renal protection because he was concerned this medication may have contributed to his presentation of suspected PRES.   - Follow-up 10/24/2012: Lisinopril  stopped due to concern of contributing to PRES. Repeat MRI brain on 10/09/2012 showed improving cortical and subcortical hyperintensities and without enhancement.  Two interim hospitalizations:    (1) 11/6-11/18: Admitted with seizures in the setting of hypertensive emergency required intubation for airway protection. He received Ativan 2 mg x2 and loaded with fosphenytoin. While in the ED, he was noted to have new onset atrial fibrillation with RVR. He is also treated for aspiration pneumonia and discharged on Levaquin. Blood pressure was optimized with labetalol 200 mg twice daily and norvasc 136m   (2) 11/19-12/03/2012: Patient presented to emergency room with respiratory failure and found to have bilateral multifocal pulmonary emboli and right sided DVT. His hospital course was notable for pulmonary edema, hypernatremia, and respiratory insufficiency. He was started on Xeralto.  - Follow-up 01/21/2013: Clinically, the patient was doing very well. MRI of brain (12/31) was ordered given his recent hospitalization for seizures which showed several areas of white matter changes which had progressed since his previous MRI of brain in September 2014. In addition, there is a new left lobar subacute hematoma, which was not previously seen on his CT head dated 11/6. 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 Subacute hematoma may have been due elevated blood pressure and that he is on anticoagulation for PE/DVT.    - Follow-up 02/06/2013: Recently admitted for anticoagulation management and was found to have subacute intraparenchymal hematoma. During that admission, he had repeat MRI of the brain which showed microhemorrhages so anticoagulation was stopped. He also has cerebral angiogram and lumbar puncture looking for CNS vasculitis. Results were nonrevealing. XeAlen Blewas  stopped and he had IVC filter placement.    - Follow-up 04/10/2013:  Clinically, he is doing well. Interval MRI brain was done in March which  shows waxing/waning cortical and subcortical white matter changes in areas that were previously uninvolved.  No new infarcts.  He underwent second LP and had additional lab testing for possible vasculitis, which returned unrevealing.  Paraneoplastic panel was negative.  Clinically he no new complaints or interval seizures.     In 2016, he has no new issues.  Imaging remained stable with no new hemorrhages.    In 2017, he had one brief spell of visual changes, described as distorted faces, but would only be that was when looking at the TV screen.  He was concerned that this was a side effect of his medication, so stopped his Keppra.  One week later, he had a witnessed generalized tonic clonic seizure and was taken to the ER.  CT head was normal and he was instructed to restart his Keppra 557m twice daily.  He has been compliant with his medication and has not had any GTC seizures, but does report to having one more spell of visual distortion, lasting 30-minutes in January, again only with faces on the TV.  He tried closing one eye and noticed that it was present in both eyes.  When he looked at his wife or himself in the mirror, the images were normal.    UPDATE 09/04/2016:  He is here for 6 month follow-up visit and has no new neurological complaints.  No further spells of visual distortion.  He has been very compliant with his Keppra 5061mtwice daily.  No interval hospitalizations or illnesses.  He is doing very well controlling his diabetes with last HbA1c ~5.  Medications:  Current Outpatient Prescriptions on File Prior to Visit  Medication Sig Dispense Refill  . amLODipine (NORVASC) 10 MG tablet Take 1 tablet (10 mg total) by mouth daily. 30 tablet   . glimepiride (AMARYL) 2 MG tablet Take 1 tablet (2 mg total) by mouth 2 (two) times daily with a meal. Breakfast and dinner 60 tablet 3  . glimepiride (AMARYL) 4 MG tablet     . glucose blood (ONE TOUCH ULTRA TEST) test strip TEST twice a day    .  labetalol (NORMODYNE) 200 MG tablet Take 1 tablet (200 mg total) by mouth 2 (two) times daily. 60 tablet 3  . levETIRAcetam (KEPPRA) 500 MG tablet Take 1 tablet (500 mg total) by mouth 2 (two) times daily. 180 tablet 3  . Linagliptin-Metformin HCl (JENTADUETO) 2.05-998 MG TABS Take 1 tablet by mouth daily. 30 tablet 3  . Multiple Vitamin (MULTIVITAMIN WITH MINERALS) TABS tablet Take 1 tablet by mouth daily.    . ONE TOUCH LANCETS MISC     . ONE TOUCH ULTRA TEST test strip 2 (two) times daily. for testing  1  . pantoprazole (PROTONIX) 40 MG tablet TAKE 1 BY MOUTH DAILY    . rosuvastatin (CRESTOR) 10 MG tablet Take 10 mg by mouth daily.     No current facility-administered medications on file prior to visit.     Allergies:  Allergies  Allergen Reactions  . Lisinopril Other (See Comments)    Light-headedness, possible syncope, MD thinks that this medication caused patient to "pass out" "faint".  (blurred vision per spouse)     Review of Systems:  CONSTITUTIONAL: No fevers, chills, night sweats, or weight loss.   EYES: No visual changes or eye pain ENT: No  hearing changes.  No history of nose bleeds.   RESPIRATORY: No cough, wheezing and shortness of breath.   CARDIOVASCULAR: Negative for chest pain, and palpitations.   GI: Negative for abdominal discomfort, blood in stools or black stools.  No recent change in bowel habits.   GU:  No history of incontinence.   MUSCLOSKELETAL: No history of joint pain or swelling.  No myalgias.   SKIN: Negative for lesions, rash, and itching.   ENDOCRINE: Negative for cold or heat intolerance, polydipsia or goiter.   PSYCH:  No depression or anxiety symptoms.   NEURO: As Above.   Vital Signs:  BP 140/80   Pulse 81   Ht '5\' 9"'  (1.753 m)   Wt 227 lb 3 oz (103.1 kg)   SpO2 97%   BMI 33.55 kg/m   Neurological Exam: MENTAL STATUS including orientation to time, place, person, recent and remote memory, attention span and concentration, language, and  fund of knowledge is normal.  Speech is not dysarthric.  CRANIAL NERVES:  Visual fields intact.  Pupils equal round and reactive to light.  Normal conjugate, extra-ocular eye movements in all directions of gaze.  Mild left ptosis.  Face is symmetric. Palate elevates symmetrically.  MOTOR:  Motor strength is 5/5 in all extremities.    SENSATION:  Normal vibration throughout.   REFLEXES:  Brisk and symmetric throughout 2+/4.  COORDINATION/GAIT:  No dysmetria.  Gait narrow based and stable.   Data: MRI brain 10/09/2012:  1. Improving cortical and subcortical T2 hyperintensities this.  2. Previously seen intravascular enhancement is no longer present.  3. This most likely represents resolving posterior reversible encephalopathy or H D E. No definite encephalomalacia is evident to confirm previous infarcts.  4. 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  MRI brain 01/29/2013: Progressed peripheral supratentorial gray-white matter FLAIR abnormality's, including left frontal lesion which shows faint  enhancement. In addition, new left posterior temporal 15 x 14 mm he subacute hematoma. Additional supratentorial micro hemorrhages. Constellation of findings may reflect chronic severe hypertension, with subsequent lobar hematoma. Scattered micro hemorrhages can also be seen with amyloid angiopathy. The FLAIR abnormality's may reflect superimposed PRES. Overall progression of disease process. No atrophy.  MRI brain 02/04/2013:  1. Possible punctate area of restricted diffusion in the anterior right frontal lobe subcortical white matter such as due to acute lacunar infarct. However, this might also be imaging artifact related to nearby chronic blood products.  2. No acute mass effect or new intracranial hemorrhage. Stable left temporal lobe hemorrhage. Advanced chronic underlying mostly micro hemorrhages in the brain elsewhere.  3. Stable signal changes in the peripheral gray  and white matter thought related to posterior reversible encephalopathy syndrome, in this patient with diabetes and hypertension.  4. Top differential considerations to consider in addition to PRES include an underlying coagulopathy (such as protein C deficiency or other hypercoagulable state) and chronic vasculitis such as due to connective tissue disorder.  MRI brain 04/10/2013: 1. No evidence of acute infarct.  2. Evolving left temporal lobe hemorrhage. Grossly stable appearance of other bilateral cerebral hemorr hages allowing for differences in technique.  3. Waxing and waning cortical and subcortical T2 hyperintensities as above. Findings could represent PRES, vasculitis, or other inflammatory conditions.   MRI cervical spine 12/04/2013: 1. Negative cervical spine except for degenerative changes. No signal abnormality in the visualized spinal cord. 2. Multifactorial mild spinal stenosis at C4-C5 and C5-C6. Multifactorial moderate to severe foraminal stenosis at the bilateral  C6 and C7 nerve levels.  MRI brain wwo contrast 12/04/2013: 1. The patient has suffered a hemorrhage in the left amygdala since the scan in March, but now this is chronic. Given the constellation of numerous previous hemorrhages, some more macroscopic than microscopic, consider amyloid angiopathy. Also I note the presence of an IVC filter in this patient, alternatively is there a history of congenital coagulopathy? 2. There are areas of superficial siderosis over both convexities. 3. Virtually resolved cerebral edema which was associated with parenchymal hemorrhages in March. Underlying nonspecific white matter signal changes, not strongly suggestive of demyelinating disease and mild by comparison to the extent of chronic blood products.  MRI brain wwo contrast 03/30/2015:  Stable burden of remote lobar hemorrhages most consistent with amyloid angiopathy.  MRI brain wwo contrast 12/25/2015: 1. No acute intracranial  abnormality and essentially stable MRI appearance of the brain since May 2016. 2. Hippocampal formations appear symmetric and normal aside from possible subtle T2 hyperintensity on the left such as due to recent seizure activity. 3. Extensive chronic parenchymal hemorrhages again noted, most overtly in the right occipital lobe where there is an area of cortical encephalomalacia. As noted in 2015 a fair amount of hemosiderin appears to be superficial/subarachnoid, although discontinuous and un-like the typical appearance of superficial siderosis. As before top differential considerations include amyloid angiopathy, vasculitis, and hypercoagulable state.  Cerebral angiogram 02/10/2013: Mild focal areas of segmental caliber narrowing of the distal left pericallosal branches. These findings are nonspecific. Differential considerations would changes secondary to intracranial arteriosclerosis, vasospasm or vasculitis. Clinical correlation is recommended.  CSF 02/07/2013:  R21 W1 G59 P57,   IgG index 5.0, OCB neg CSF 05/29/2012:  R12  W1  G65 P48  IgG index 2.9 MBP <0.2, OCB neg    Cryptococcal neg, Lyme neg, VZV neg, ACE 5, cytology neg  Labs 02/05/2013:  ESR 15, RPR nonreactive, ANA neg, total complement >60*, C4 31, C3 128, HIV, ACE neg  Labs 04/21/2013:  CRP <0.5, ESR 1, SPEP/UPEP with IFE no M protein, ACE 23, celiac panel neg, ENA neg, cryoglobulin neg, ANCA neg, neocomplete paraneoplastic antibody testing negative  Hypercoagulable work-up:  antithrombin III, protein C, protein S, lupus anticoagulant, homocystine, factor 5 Leiden - wnl  IMPRESSION: 1. 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  He has underwent extensive work-up for ?PRES and vasculitis including cerebral angiogram and CSF testing x 2 which return normal. MRI brain from November 2017 shows no interval hemorrhages. There was  note of a subtle T2 hyperintensity in the left hippocampus, likely due to seizure activity. Definitive diagnosis is made by tissue biopsy, but has deferred this. We discussed at length about the potential complications with CAA including risk of bleeding especially if he would need anticoagulant or antiplatelet therapy, but risks vs benefit would need to be weighed.    2.  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 No further spells since January 2018. If he develops more spells, we will need to increase Keppra 774m twice daily, but for now will continue to monitor him on Keppra 5068mtwice daily.   3. Hypertension, on labetalol 20052mID and norvasc 60m40mily, well-controlled, followed by PCP  4. Diabetes mellitus, well-controlled, followed by PCP   PLAN/RECOMMENDATIONS:  1.  Continue Keppra 500mg50mce daily - refills provided. 2.  Continue to minimize vascular risk factors with management hypertension and diabetes 4.  Stay off aspirin to  reduce bleeding risk, unless indicated for cardiac reasons   Return to clinic in 1 year  The duration of this appointment visit was 15 minutes of face-to-face time with the patient.  Greater than 50% of this time was spent in counseling, explanation of diagnosis, planning of further management, and coordination of care.   Thank you for allowing me to participate in patient's care.  If I can answer any additional questions, I would be pleased to do so.    Sincerely,    Chief Walkup K. Posey Pronto, DO

## 2016-09-04 NOTE — Patient Instructions (Addendum)
Continue Keppra 500mg  twice daily  Return to clinic in 1 year

## 2017-09-05 ENCOUNTER — Ambulatory Visit (INDEPENDENT_AMBULATORY_CARE_PROVIDER_SITE_OTHER): Payer: PRIVATE HEALTH INSURANCE | Admitting: Neurology

## 2017-09-05 ENCOUNTER — Encounter: Payer: Self-pay | Admitting: Neurology

## 2017-09-05 VITALS — BP 110/70 | HR 86 | Ht 69.0 in | Wt 233.5 lb

## 2017-09-05 DIAGNOSIS — R569 Unspecified convulsions: Secondary | ICD-10-CM

## 2017-09-05 DIAGNOSIS — I680 Cerebral amyloid angiopathy: Secondary | ICD-10-CM

## 2017-09-05 DIAGNOSIS — E854 Organ-limited amyloidosis: Secondary | ICD-10-CM

## 2017-09-05 MED ORDER — LEVETIRACETAM 500 MG PO TABS: 500.0000 mg | ORAL_TABLET | Freq: Two times a day (BID) | ORAL | 3 refills | Status: DC

## 2017-09-05 NOTE — Patient Instructions (Addendum)
Great to see you!  Your refills have been sent.   Return to clinic in 1 year

## 2017-09-05 NOTE — Progress Notes (Signed)
Follow-up Visit   Date: 09/05/17    Juan Glenn MRN: [id number] DOB: [birth year]   Interim History: Juan Glenn is a 62 y.o. right-handed African [place] male with history of hypertension, diabetes mellitus, cerebral amyloid angiopathy, seizure disorder, and situational DVT/PE s/p IVC filter returning to the clinic for follow-up of seizure disorder and CAA.  History of present illness: He was admitted to [facility] on 8/7-09/07/2012 where he was bought by ambulance after being found confused and unconscious at work, concerning for a seizure.  His work-up included EEG, MRI brain wwo contrast, CTA, echo, and laboratory resting. EEG was normal and MRI brain which showed abnormal patchy areas of T2 signal intensity involving bilateral temporal lobes, occipital lobes, parietal lobes, and left frontal lobe, suspicious for PRES. Some of these areas demonstrate mild enhancement. His PCP reduced the dose of his lisinopril to 61m which he takes for renal protection because he was concerned this medication may have contributed to his presentation of suspected PRES. Repeat MRI brain on 10/09/2012 showed improving cortical and subcortical hyperintensities and without enhancement.  In early November 2014, he was admitted with seizures in the setting of hypertensive emergency required intubation for airway protection. He received Ativan 2 mg x2 and loaded with fosphenytoin. While in the ED, he was noted to have new onset atrial fibrillation with RVR. He is also treated for aspiration pneumonia and discharged on Levaquin.   Late November 2014, patient presented to emergency room with respiratory failure and found to have bilateral multifocal pulmonary emboli and right sided DVT. His hospital course was notable for pulmonary edema, hypernatremia, and respiratory insufficiency. He was started on Xeralto.  Follow-up MRI brain in 12/2012 due to recent seizures showed several areas of white  matter changes which had progressed since his previous MRI of brain in September 2014. In addition, there is a new left lobar subacute hematoma, which was not previously seen on his CT head dated 11/6.  Repeat MRI of the brain in January 2015 showed microhemorrhages so anticoagulation was stopped. He also has cerebral angiogram and lumbar puncture looking for CNS vasculitis. Results were negative. Juan Glenn stopped and he had IVC filter placement.    Interval MRI brain 2015 showed waxing/waning cortical and subcortical white matter changes in areas that were previously uninvolved.  No new infarcts.  He underwent second LP and had additional lab testing for possible vasculitis, which returned unrevealing.  Paraneoplastic panel was negative.  Working diagnosis is cerebral amyloid angiopathy.  Biopsy was declined.   In 2016, he has no new issues.  Imaging remained stable with no new hemorrhages.    In 2017, he had one brief spell of visual changes, described as distorted faces, but would only be that was when looking at the TV screen.  He was concerned that this was a side effect of his medication, so stopped his Keppra.  One week later, he had a witnessed generalized tonic clonic seizure and was taken to the ER.  CT head was normal and he was instructed to restart his Keppra 5065mtwice daily.  He has been compliant with his medication and has not had any GTC seizures, but does report to having one more spell of visual distortion, lasting 30-minutes in January, again only with faces on the TV.  He tried closing one eye and noticed that it was present in both eyes.  When he looked at his wife or himself in the mirror, the images were normal.  No further  spells.   UPDATE 09/05/2017:  He is here for 1 year follow-up visit and is doing great.  He denies any seizures, visual changes, weakness, or numbness/tingling.  He is compliant with Keppra 583m twice daily and tolerating this well.  No interval illnesses or  hospitalizations.  His blood pressure and diabetes is under great control.  He has gained about 15lb over the past year and admits he has a good appetite.  He retired in April 2018 and has been enjoying his time at home with his wife.   Medications:  Current Outpatient Medications on File Prior to Visit  Medication Sig Dispense Refill  . amLODipine (NORVASC) 10 MG tablet Take 1 tablet (10 mg total) by mouth daily. 30 tablet   . glimepiride (AMARYL) 2 MG tablet Take 1 tablet (2 mg total) by mouth 2 (two) times daily with a meal. Breakfast and dinner 60 tablet 3  . glimepiride (AMARYL) 4 MG tablet     . glucose blood (ONE TOUCH ULTRA TEST) test strip TEST twice a day    . labetalol (NORMODYNE) 200 MG tablet Take 1 tablet (200 mg total) by mouth 2 (two) times daily. 60 tablet 3  . Linagliptin-Metformin HCl (JENTADUETO) 2.05-998 MG TABS Take 1 tablet by mouth daily. 30 tablet 3  . loratadine (CLARITIN) 10 MG tablet Take by mouth.    . metFORMIN (GLUCOPHAGE) 1000 MG tablet Take by mouth.    . Multiple Vitamin (MULTIVITAMIN WITH MINERALS) TABS tablet Take 1 tablet by mouth daily.    . ONE TOUCH LANCETS MISC     . ONE TOUCH ULTRA TEST test strip 2 (two) times daily. for testing  1  . pantoprazole (PROTONIX) 40 MG tablet TAKE 1 BY MOUTH DAILY    . pioglitazone (ACTOS) 30 MG tablet TAKE 1 TABLET BY MOUTH  DAILY    . rosuvastatin (CRESTOR) 10 MG tablet Take 10 mg by mouth daily.    . Vitamins/Minerals TABS Take by mouth.     No current facility-administered medications on file prior to visit.     Allergies:  Allergies  Allergen Reactions  . Lisinopril Other (See Comments)    Light-headedness, possible syncope, MD thinks that this medication caused patient to "pass out" "faint".  (blurred vision per spouse)     Review of Systems:  CONSTITUTIONAL: No fevers, chills, night sweats, or weight loss.   EYES: No visual changes or eye pain ENT: No hearing changes.  No history of nose bleeds.     RESPIRATORY: No cough, wheezing and shortness of breath.   CARDIOVASCULAR: Negative for chest pain, and palpitations.   GI: Negative for abdominal discomfort, blood in stools or black stools.  No recent change in bowel habits.   GU:  No history of incontinence.   MUSCLOSKELETAL: No history of joint pain or swelling.  No myalgias.   SKIN: Negative for lesions, rash, and itching.   ENDOCRINE: Negative for cold or heat intolerance, polydipsia or goiter.   PSYCH:  No depression or anxiety symptoms.   NEURO: As Above.   Vital Signs:  BP 110/70   Pulse 86   Ht '5\' 9"'  (1.753 m)   Wt 233 lb 8 oz (105.9 kg)   SpO2 96%   BMI 34.48 kg/m   General Medical Exam:   General:  Well appearing, comfortable  Eyes/ENT: see cranial nerve examination.   Neck: No masses appreciated.  Full range of motion without tenderness.  No carotid bruits. Respiratory:  Clear to auscultation, good  air entry bilaterally.   Cardiac:  Regular rate and rhythm, no murmur.   Ext:  No edema  Neurological Exam: MENTAL STATUS including orientation to time, place, person, recent and remote memory, attention span and concentration, language, and fund of knowledge is normal.  Speech is not dysarthric.  CRANIAL NERVES:  Visual fields intact.  Pupils equal round and reactive to light.  Normal conjugate, extra-ocular eye movements in all directions of gaze.  Mild left ptosis.  Face is symmetric. Palate elevates symmetrically.  MOTOR:  Motor strength is 5/5 in all extremities.    SENSATION:  Normal vibration throughout.   REFLEXES:  Brisk and symmetric throughout 3+/4, and 2+/4 at the [place]  COORDINATION/GAIT:  No dysmetria.  Gait narrow based and stable.   Data: MRI brain 10/09/2012:  1. Improving cortical and subcortical T2 hyperintensities this.  2. Previously seen intravascular enhancement is no longer present.  3. This most likely represents resolving posterior reversible encephalopathy or H D E. No definite  encephalomalacia is evident to confirm previous infarcts.  4. 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  MRI brain 01/29/2013: Progressed peripheral supratentorial gray-white matter FLAIR abnormality's, including left frontal lesion which shows faint  enhancement. In addition, new left posterior temporal 15 x 14 mm he subacute hematoma. Additional supratentorial micro hemorrhages. Constellation of findings may reflect chronic severe hypertension, with subsequent lobar hematoma. Scattered micro hemorrhages can also be seen with amyloid angiopathy. The FLAIR abnormality's may reflect superimposed PRES. Overall progression of disease process. No atrophy.  MRI brain 02/04/2013:  1. Possible punctate area of restricted diffusion in the anterior right frontal lobe subcortical white matter such as due to acute lacunar infarct. However, this might also be imaging artifact related to nearby chronic blood products.  2. No acute mass effect or new intracranial hemorrhage. Stable left temporal lobe hemorrhage. Advanced chronic underlying mostly micro hemorrhages in the brain elsewhere.  3. 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  4. Top differential considerations to consider in addition to PRES include an underlying coagulopathy (such as protein C deficiency or other hypercoagulable state) and chronic vasculitis such as due to connective tissue disorder.  MRI brain 04/10/2013: 1. No evidence of acute infarct.  2. Evolving left temporal lobe hemorrhage. Grossly stable appearance of other bilateral cerebral hemorr hages allowing for differences in technique.  3. Waxing and waning cortical and subcortical T2 hyperintensities as above. Findings could represent PRES, vasculitis, or other inflammatory conditions.   MRI cervical spine 12/04/2013: 1.  Negative cervical spine except for degenerative changes. No signal abnormality in the visualized spinal cord. 2. Multifactorial mild spinal stenosis at C4-C5 and C5-C6. Multifactorial moderate to severe foraminal stenosis at the bilateral C6 and C7 nerve levels.  MRI brain wwo contrast 12/04/2013: 1. The patient has suffered a hemorrhage in the left amygdala since the scan in March, but now this is chronic. Given the constellation of numerous previous hemorrhages, some more macroscopic than microscopic, consider amyloid angiopathy. Also I note the presence of an IVC filter in this patient, alternatively is there a history of congenital coagulopathy? 2. There are areas of superficial siderosis over both convexities. 3. Virtually resolved cerebral edema which was associated with parenchymal hemorrhages in March. Underlying nonspecific white matter signal changes, not strongly suggestive of demyelinating disease and mild by comparison to the extent of chronic blood products.  MRI brain  wwo contrast 03/30/2015:  Stable burden of remote lobar hemorrhages most consistent with amyloid angiopathy.  MRI brain wwo contrast 12/25/2015: 1. No acute intracranial abnormality and essentially stable MRI appearance of the brain since May 2016. 2. Hippocampal formations appear symmetric and normal aside from possible subtle T2 hyperintensity on the left such as due to recent seizure activity. 3. Extensive chronic parenchymal hemorrhages again noted, most overtly in the right occipital lobe where there is an area of cortical encephalomalacia. As noted in 2015 a fair amount of hemosiderin appears to be superficial/subarachnoid, although discontinuous and un-like the typical appearance of superficial siderosis. As before top differential considerations include amyloid angiopathy, vasculitis, and hypercoagulable state.  Cerebral angiogram 02/10/2013: Mild focal areas of segmental caliber narrowing of the distal left  pericallosal branches. These findings are nonspecific. Differential considerations would changes secondary to intracranial arteriosclerosis, vasospasm or vasculitis. Clinical correlation is recommended.  CSF 02/07/2013:  R21 W1 G59 P57,   IgG index 5.0, OCB neg CSF 05/29/2012:  R12  W1  G65 P48  IgG index 2.9 MBP <0.2, OCB neg    Cryptococcal neg, Lyme neg, VZV neg, ACE 5, cytology neg  Labs 02/05/2013:  ESR 15, RPR nonreactive, ANA neg, total complement >60*, C4 31, C3 128, HIV, ACE neg  Labs 04/21/2013:  CRP <0.5, ESR 1, SPEP/UPEP with IFE no M protein, ACE 23, celiac panel neg, ENA neg, cryoglobulin neg, ANCA neg, neocomplete paraneoplastic antibody testing negative  Hypercoagulable work-up:  antithrombin III, protein C, protein S, lupus anticoagulant, homocystine, factor 5 Leiden - wnl  IMPRESSION: 1. Probable cerebral amyloid angiography manifested with multiple lobar hemorrhages and seizures (09/2012).  He has remained seizure-free and clinically stable since 2016.  No neurological deficits.  He has underwent extensive work-up as noted above, excluding PRES and CNS vasculitis. Definitive diagnosis is made by tissue biopsy, which was declined.  He is aware about the potential complications with CAA including risk of bleeding especially if he would need anticoagulant or antiplatelet therapy, but risks vs benefit would need to be weighed.  Stay off aspirin to reduce bleeding risk, unless indicated for cardiac reasons  2.  Seizure disorder due to #1, doing great with last seizure in December 2014.  Continue Keppra 536m twice daily  2. Hypertension, on labetalol 2096mBID and norvasc 1026maily, well-controlled, followed by PCP  3. Diabetes mellitus, well-controlled, followed by PCP.  HbA1c 6.3.   Return to clinic in 1 year   Thank you for allowing me to participate in patient's care.  If I can answer any additional questions, I would be pleased to do so.    Sincerely,    Juan Glenn. PatPosey ProntoDO

## 2018-02-15 DIAGNOSIS — C61 Malignant neoplasm of prostate: Secondary | ICD-10-CM | POA: Insufficient documentation

## 2018-09-06 ENCOUNTER — Encounter: Payer: Self-pay | Admitting: Neurology

## 2018-09-06 ENCOUNTER — Telehealth: Payer: Self-pay

## 2018-09-06 ENCOUNTER — Telehealth (INDEPENDENT_AMBULATORY_CARE_PROVIDER_SITE_OTHER): Payer: PRIVATE HEALTH INSURANCE | Admitting: Neurology

## 2018-09-06 ENCOUNTER — Other Ambulatory Visit: Payer: Self-pay

## 2018-09-06 VITALS — Ht 69.0 in | Wt 225.0 lb

## 2018-09-06 DIAGNOSIS — I680 Cerebral amyloid angiopathy: Secondary | ICD-10-CM

## 2018-09-06 DIAGNOSIS — R569 Unspecified convulsions: Secondary | ICD-10-CM

## 2018-09-06 DIAGNOSIS — E1121 Type 2 diabetes mellitus with diabetic nephropathy: Secondary | ICD-10-CM | POA: Insufficient documentation

## 2018-09-06 DIAGNOSIS — E854 Organ-limited amyloidosis: Secondary | ICD-10-CM | POA: Diagnosis not present

## 2018-09-06 MED ORDER — LEVETIRACETAM 500 MG PO TABS: 500.0000 mg | ORAL_TABLET | Freq: Two times a day (BID) | ORAL | 3 refills | Status: DC

## 2018-09-06 NOTE — Progress Notes (Signed)
   Virtual Visit via Video Note The purpose of this virtual visit is to provide medical care while limiting exposure to the novel coronavirus.    Consent was obtained for video visit:  Yes.   Answered questions that patient had about telehealth interaction:  Yes.   I discussed the limitations, risks, security and privacy concerns of performing an evaluation and management service by telemedicine. I also discussed with the patient that there may be a patient responsible charge related to this service. The patient expressed understanding and agreed to proceed.  Pt location: Home Physician Location: office Name of referring provider:  Radiontchenko, Alexei, * I connected with Juan Glenn at patients initiation/request on 09/06/2018 at  9:30 AM EDT by video enabled telemedicine application and verified that I am speaking with the correct person using two identifiers. Pt MRN:  [id number] Pt DOB:  [birth year] Video Participants:  Juan Glenn   History of Present Illness: This is a 63 y.o. male returning for follow-up of seizure disorder and cerebral amyloid angiopathy.  Over the past year, he has been doing well with no neurological complaints.  He has not had any breakthrough seizures and is tolerating Keppra 500 mg twice daily.  No interval illnesses, hospitalizations, or falls.   Observations/Objective:   Vitals:   09/06/18 0838  Weight: 225 lb (102.1 kg)  Height: 5\' 9"  (1.753 m)   Patient is awake, alert, and appears comfortable.  Oriented x 4.   Extraocular muscles are intact. No ptosis.  Face is symmetric.  Speech is not dysarthric. Tongue is midline. Antigravity in all extremities.  No pronator drift. Gait appears normal .   Assessment and Plan:  1. Probable cerebral amyloid angiopathy manifesting with multiple lobar hemorrhages and seizures (09/2012).  He has remained seizure-free and clinically stable since 2016.  Avoid anticoagulation and antiplatelet therapy due  to increased risk of bleeding.    2. Seizure disorder due to #1.  He will be continued on Keppra 500 mg twice daily. Refills provided for 1 year.  Last seizure was in December 2014.  He had questions about whether Keppra could be discontinued at some point.  Given his history, I am very reluctant to take him off an antiepileptic medication as he does have propensity to develop seizures, especially with known cerebral amyloid angiopathy.  Follow Up Instructions:   I discussed the assessment and treatment plan with the patient. The patient was provided an opportunity to ask questions and all were answered. The patient agreed with the plan and demonstrated an understanding of the instructions.   The patient was advised to call back or seek an in-person evaluation if the symptoms worsen or if the condition fails to improve as anticipated.  Follow-up in 1 year  Total time spent:  20 minutes     Alda Berthold, DO

## 2018-09-06 NOTE — Telephone Encounter (Signed)
Scheduled appt for patient 09/05/2019 at 930

## 2018-09-16 DIAGNOSIS — C61 Malignant neoplasm of prostate: Secondary | ICD-10-CM | POA: Insufficient documentation

## 2019-01-16 DIAGNOSIS — Z9079 Acquired absence of other genital organ(s): Secondary | ICD-10-CM | POA: Insufficient documentation

## 2019-04-06 ENCOUNTER — Emergency Department (HOSPITAL_COMMUNITY)
Admission: EM | Admit: 2019-04-06 | Discharge: 2019-04-07 | Disposition: A | Discharge status: Home / Self Care | Payer: PRIVATE HEALTH INSURANCE | Attending: Emergency Medicine | Admitting: Emergency Medicine

## 2019-04-06 ENCOUNTER — Other Ambulatory Visit: Payer: Self-pay

## 2019-04-06 ENCOUNTER — Encounter (HOSPITAL_COMMUNITY): Payer: Self-pay

## 2019-04-06 DIAGNOSIS — I129 Hypertensive chronic kidney disease with stage 1 through stage 4 chronic kidney disease, or unspecified chronic kidney disease: Secondary | ICD-10-CM | POA: Insufficient documentation

## 2019-04-06 DIAGNOSIS — Z8546 Personal history of malignant neoplasm of prostate: Secondary | ICD-10-CM | POA: Insufficient documentation

## 2019-04-06 DIAGNOSIS — R569 Unspecified convulsions: Secondary | ICD-10-CM | POA: Insufficient documentation

## 2019-04-06 DIAGNOSIS — E1122 Type 2 diabetes mellitus with diabetic chronic kidney disease: Secondary | ICD-10-CM | POA: Insufficient documentation

## 2019-04-06 DIAGNOSIS — Z7984 Long term (current) use of oral hypoglycemic drugs: Secondary | ICD-10-CM | POA: Insufficient documentation

## 2019-04-06 DIAGNOSIS — N183 Chronic kidney disease, stage 3 unspecified: Secondary | ICD-10-CM | POA: Insufficient documentation

## 2019-04-06 DIAGNOSIS — Z79899 Other long term (current) drug therapy: Secondary | ICD-10-CM | POA: Insufficient documentation

## 2019-04-06 LAB — I-STAT CHEM 8, ED
BUN: 7 mg/dL — ABNORMAL LOW (ref 8–23)
Calcium, Ion: 1.25 mmol/L (ref 1.15–1.40)
Chloride: 106 mmol/L (ref 98–111)
Creatinine, Ser: 1 mg/dL (ref 0.61–1.24)
Glucose, Bld: 170 mg/dL — ABNORMAL HIGH (ref 70–99)
HCT: 41 % (ref 39.0–52.0)
Hemoglobin: 13.9 g/dL (ref 13.0–17.0)
Potassium: 3.3 mmol/L — ABNORMAL LOW (ref 3.5–5.1)
Sodium: 143 mmol/L (ref 135–145)
TCO2: 25 mmol/L (ref 22–32)

## 2019-04-06 LAB — CBG MONITORING, ED: Glucose-Capillary: 189 mg/dL — ABNORMAL HIGH (ref 70–99)

## 2019-04-06 MED ORDER — LEVETIRACETAM IN NACL 1000 MG/100ML IV SOLN
1000.0000 mg | Freq: Once | INTRAVENOUS | Status: AC
  Administered 2019-04-07: 1000 mg via INTRAVENOUS
  Filled 2019-04-06: qty 100

## 2019-04-06 NOTE — ED Provider Notes (Signed)
[facility] DEPT Provider Note   CSN: [id number] Arrival date & time: 04/06/19  2119     History Chief Complaint  Patient presents with  . Seizures    Juan Glenn is a 64 y.o. male.  Patient presents to the emergency department for evaluation of seizure.  Patient does have a known seizure disorder.  He reports that he has not had a seizure since 2014.  He takes Keppra and has not missed any doses.  Seizure occurred tonight, lasted approximately 3 minutes.  Patient denies injury.  He did not bite his tongue.  No urinary incontinence.  He has a slight headache currently but no other complaints.  Patient denies any recent illness, no fever, infectious symptoms.  No increased stress, no insomnia.        Past Medical History:  Diagnosis Date  . Diabetes mellitus without complication ([place]) 123XX123  . Difficult intubation   . Hyperlipidemia   . PRES (posterior reversible encephalopathy syndrome) 09/2012  . Pulmonary embolism ([place])   . Renal disorder     Patient Active Problem List   Diagnosis Date Noted  . Diabetes mellitus with nephropathy ([place]) 09/06/2018  . Prostate cancer ([place]) 02/15/2018  . Tubular adenoma of colon 05/07/2016  . Cerebral amyloid angiopathy ([place]) 01/06/2014  . S/P insertion of IVC (inferior vena caval) filter 02/11/2013  . Intracranial hematoma ([place]) 02/04/2013  . Hematoma 02/04/2013  . Essential hypertension 01/02/2013  . Right leg DVT ([place]) 01/02/2013  . Seizure ([place]) 01/02/2013  . CKD (chronic kidney disease) stage 3, GFR 30-59 ml/min 12/24/2012  . Aspiration pneumonia ([place]) 12/24/2012  . Pulmonary embolism ([place]) 12/19/2012  . Acute respiratory failure ([place]) 12/05/2012  . ARDS (adult respiratory distress syndrome) ([place]) 12/05/2012  . Microproteinuria 11/16/2012  . HTN (hypertension), malignant 09/06/2012  . DM (diabetes mellitus), type 2 with renal complications ([place]) 99991111  . Convulsions/seizures ([place])  09/06/2012  . Abnormal CT of brain 09/06/2012  . Syncope 09/06/2012  . Posterior reversible encephalopathy syndrome 08/30/2012    Past Surgical History:  Procedure Laterality Date  . no surgical history  09/2012       Family History  Problem Relation Age of Onset  . Diabetes Mother   . Congestive Heart Failure Mother        Died, 29s  . Heart attack Father        Died, 11  . Diabetes Brother   . Hypertension Sister   . Cancer Brother        Died, 49    Social History   Tobacco Use  . Smoking status: Never Smoker  . Smokeless tobacco: Never Used  Substance Use Topics  . Alcohol use: No  . Drug use: No    Home Medications Prior to Admission medications   Medication Sig Start Date End Date Taking? Authorizing Provider  glimepiride (AMARYL) 4 MG tablet Take 4 mg by mouth in the morning and at bedtime.  06/10/14  Yes [provider]  labetalol (NORMODYNE) 200 MG tablet Take 1 tablet (200 mg total) by mouth 2 (two) times daily. 12/18/12  Yes Ollis, Cleaster Corin, NP  levETIRAcetam (KEPPRA) 500 MG tablet Take 1 tablet (500 mg total) by mouth 2 (two) times daily. 09/06/18  Yes Patel, Donika K, DO  loratadine-pseudoephedrine (CLARITIN-D 24-HOUR) 10-240 MG 24 hr tablet Take 1 tablet by mouth daily as needed for allergies.   Yes [provider]  metFORMIN (GLUCOPHAGE) 1000 MG tablet Take 1,000 mg by mouth 2 (  two) times daily with a meal.  07/25/17  Yes [provider]  Multiple Vitamin (MULTIVITAMIN WITH MINERALS) TABS tablet Take 1 tablet by mouth daily.   Yes [provider]  pioglitazone (ACTOS) 30 MG tablet Take 30 mg by mouth daily.  08/13/17  Yes [provider]  rosuvastatin (CRESTOR) 10 MG tablet Take 10 mg by mouth daily.   Yes [provider]  valsartan (DIOVAN) 160 MG tablet Take 160 mg by mouth daily. 06/19/18  Yes [provider]  amLODipine (NORVASC) 10 MG tablet Take 1 tablet (10 mg total) by mouth daily. Patient  not taking: Reported on 04/06/2019 01/01/13   Dellinger, Bobby Rumpf, PA-C  glimepiride (AMARYL) 2 MG tablet Take 1 tablet (2 mg total) by mouth 2 (two) times daily with a meal. Breakfast and dinner Patient not taking: Reported on 04/06/2019 01/01/13   Dellinger, Haynes Dage L, PA-C  glucose blood (ONE TOUCH ULTRA TEST) test strip TEST twice a day 11/12/13   [provider]  Linagliptin-Metformin HCl (JENTADUETO) 2.05-998 MG TABS Take 1 tablet by mouth daily. Patient not taking: Reported on 04/06/2019 01/01/13   Dellinger, Bobby Rumpf, PA-C  ONE TOUCH LANCETS MISC  06/06/13   [provider]  ONE TOUCH ULTRA TEST test strip 2 (two) times daily. for testing 11/12/13   [provider]    Allergies    Lisinopril and Other  Review of Systems   Review of Systems  Neurological: Positive for seizures.  All other systems reviewed and are negative.   Physical Exam Updated Vital Signs BP (!) 158/72   Pulse 79   Temp 98.4 F (36.9 C) (Oral)   Resp (!) 22   Ht 5\' 9"  (1.753 m)   Wt 96.6 kg   SpO2 97%   BMI 31.45 kg/m   Physical Exam Vitals and nursing note reviewed.  Constitutional:      General: He is not in acute distress.    Appearance: Normal appearance. He is well-developed.  HENT:     Head: Normocephalic and atraumatic.     Right Ear: Hearing normal.     Left Ear: Hearing normal.     Nose: Nose normal.  Eyes:     Conjunctiva/sclera: Conjunctivae normal.     Pupils: Pupils are equal, round, and reactive to light.  Cardiovascular:     Rate and Rhythm: Regular rhythm.     Heart sounds: S1 normal and S2 normal. No murmur. No friction rub. No gallop.   Pulmonary:     Effort: Pulmonary effort is normal. No respiratory distress.     Breath sounds: Normal breath sounds.  Chest:     Chest wall: No tenderness.  Abdominal:     General: Bowel sounds are normal.     Palpations: Abdomen is soft.     Tenderness: There is no abdominal tenderness. There is no guarding or  rebound. Negative signs include Murphy's sign and McBurney's sign.     Hernia: No hernia is present.  Musculoskeletal:        General: Normal range of motion.     Cervical back: Normal range of motion and neck supple.  Skin:    General: Skin is warm and dry.     Findings: No rash.  Neurological:     Mental Status: He is alert and oriented to person, place, and time.     GCS: GCS eye subscore is 4. GCS verbal subscore is 5. GCS motor subscore is 6.  Cranial Nerves: No cranial nerve deficit.     Sensory: No sensory deficit.     Coordination: Coordination normal.  Psychiatric:        Speech: Speech normal.        Behavior: Behavior normal.        Thought Content: Thought content normal.     ED Results / Procedures / Treatments   Labs (all labs ordered are listed, but only abnormal results are displayed) Labs Reviewed  CBG MONITORING, ED - Abnormal; Notable for the following components:      Result Value   Glucose-Capillary 189 (*)    All other components within normal limits  I-STAT CHEM 8, ED - Abnormal; Notable for the following components:   Potassium 3.3 (*)    BUN 7 (*)    Glucose, Bld 170 (*)    All other components within normal limits    EKG EKG Interpretation  Date/Time:  Sunday April 06 2019 21:41:56 EST Ventricular Rate:  90 PR Interval:    QRS Duration: 91 QT Interval:  333 QTC Calculation: 408 R Axis:   66 Text Interpretation: Sinus rhythm Ventricular premature complex Borderline T abnormalities, anterior leads No significant change since last tracing Confirmed by Orpah Greek [phone]) on 04/06/2019 11:36:06 PM   Radiology No results found.  Procedures Procedures (including critical care time)  Medications Ordered in ED Medications  levETIRAcetam (KEPPRA) IVPB 1000 mg/100 mL premix (0 mg Intravenous Stopped 04/07/19 0043)    ED Course  I have reviewed the triage vital signs and the nursing notes.  Pertinent labs & imaging results that  were available during my care of the patient were reviewed by me and considered in my medical decision making (see chart for details).    MDM Rules/Calculators/A&P                      Patient presents to the ER for evaluation of seizure.  Patient had a brief self-limited seizure earlier tonight.  He is back at his normal neurologic baseline at arrival.  He does have a history of seizures but has not had one in years.  There is not appear to be any obvious precipitating factor at this time.  Patient has been monitored here in the ER without any further seizures.  He was given additional Keppra.  He is appropriate for discharge and follow-up with his neurologist.  Final Clinical Impression(s) / ED Diagnoses Final diagnoses:  Seizure [facility])    Rx / DC Orders ED Discharge Orders    None       Amine Adelson, Gwenyth Allegra, MD 04/07/19 0136

## 2019-04-06 NOTE — ED Triage Notes (Signed)
Per EMS, patient c/o seizure. Patient's wife states she saw him seize for about 3 minutes. Patient has hx of seizures and takes keppra for them. His seizures are usually well controlled, patient says he hasn't had one in years. Pt denies any recent illness of change of medications. Pt currently postictal and is gradually becoming more alert and orientated. Patient ambulatory at home.

## 2019-04-07 NOTE — ED Notes (Signed)
PT alert and ambulatory upon discharge. PT verbalized understanding of discharge instructions and denies any further questions.

## 2019-04-15 ENCOUNTER — Telehealth: Payer: Self-pay | Admitting: Neurology

## 2019-04-15 NOTE — Telephone Encounter (Signed)
Called patient and left a message re:  Juan Berthold, DO  Curl, [name]  Please contact this patient for a VV follow-up visit. OK to add to next Thur 10:30a.

## 2019-04-16 NOTE — Telephone Encounter (Signed)
Called patient and left a message re:  Juan Berthold, DO  Curl, [name]  Please contact this patient for a VV follow-up visit. OK to add to next Thur 10:30a.

## 2019-04-17 NOTE — Telephone Encounter (Signed)
05/08/19 at 11:10 AM

## 2019-04-20 ENCOUNTER — Encounter (HOSPITAL_COMMUNITY): Payer: Self-pay

## 2019-04-20 ENCOUNTER — Emergency Department (HOSPITAL_COMMUNITY): Payer: Self-pay

## 2019-04-20 ENCOUNTER — Emergency Department (HOSPITAL_COMMUNITY)
Admission: EM | Admit: 2019-04-20 | Discharge: 2019-04-21 | Disposition: A | Discharge status: Home / Self Care | Payer: Self-pay | Attending: Emergency Medicine | Admitting: Emergency Medicine

## 2019-04-20 DIAGNOSIS — R1110 Vomiting, unspecified: Secondary | ICD-10-CM | POA: Insufficient documentation

## 2019-04-20 DIAGNOSIS — Z79899 Other long term (current) drug therapy: Secondary | ICD-10-CM | POA: Insufficient documentation

## 2019-04-20 DIAGNOSIS — E1122 Type 2 diabetes mellitus with diabetic chronic kidney disease: Secondary | ICD-10-CM | POA: Insufficient documentation

## 2019-04-20 DIAGNOSIS — Z7984 Long term (current) use of oral hypoglycemic drugs: Secondary | ICD-10-CM | POA: Insufficient documentation

## 2019-04-20 DIAGNOSIS — N183 Chronic kidney disease, stage 3 unspecified: Secondary | ICD-10-CM | POA: Insufficient documentation

## 2019-04-20 DIAGNOSIS — R634 Abnormal weight loss: Secondary | ICD-10-CM | POA: Insufficient documentation

## 2019-04-20 DIAGNOSIS — Z8546 Personal history of malignant neoplasm of prostate: Secondary | ICD-10-CM | POA: Insufficient documentation

## 2019-04-20 DIAGNOSIS — R569 Unspecified convulsions: Secondary | ICD-10-CM | POA: Insufficient documentation

## 2019-04-20 DIAGNOSIS — R519 Headache, unspecified: Secondary | ICD-10-CM | POA: Insufficient documentation

## 2019-04-20 DIAGNOSIS — I129 Hypertensive chronic kidney disease with stage 1 through stage 4 chronic kidney disease, or unspecified chronic kidney disease: Secondary | ICD-10-CM | POA: Insufficient documentation

## 2019-04-20 LAB — COMPREHENSIVE METABOLIC PANEL
ALT: 12 U/L (ref 0–44)
AST: 18 U/L (ref 15–41)
Albumin: 3.6 g/dL (ref 3.5–5.0)
Alkaline Phosphatase: 48 U/L (ref 38–126)
Anion gap: 14 (ref 5–15)
BUN: 10 mg/dL (ref 8–23)
CO2: 18 mmol/L — ABNORMAL LOW (ref 22–32)
Calcium: 9 mg/dL (ref 8.9–10.3)
Chloride: 105 mmol/L (ref 98–111)
Creatinine, Ser: 1.2 mg/dL (ref 0.61–1.24)
GFR calc Af Amer: >60 mL/min (ref >60)
GFR calc non Af Amer: >60 mL/min (ref >60)
Glucose, Bld: 204 mg/dL — ABNORMAL HIGH (ref 70–99)
Potassium: 3.4 mmol/L — ABNORMAL LOW (ref 3.5–5.1)
Sodium: 137 mmol/L (ref 135–145)
Total Bilirubin: 0.6 mg/dL (ref 0.3–1.2)
Total Protein: 6.6 g/dL (ref 6.5–8.1)

## 2019-04-20 LAB — CBC WITH DIFFERENTIAL/PLATELET
Abs Immature Granulocytes: 0.08 10*3/uL — ABNORMAL HIGH (ref 0.00–0.07)
Basophils Absolute: 0 10*3/uL (ref 0.0–0.1)
Basophils Relative: 0 %
Eosinophils Absolute: 0 10*3/uL (ref 0.0–0.5)
Eosinophils Relative: 0 %
HCT: 40.8 % (ref 39.0–52.0)
Hemoglobin: 13 g/dL (ref 13.0–17.0)
Immature Granulocytes: 1 %
Lymphocytes Relative: 14 %
Lymphs Abs: 1.8 10*3/uL (ref 0.7–4.0)
MCH: 28 pg (ref 26.0–34.0)
MCHC: 31.9 g/dL (ref 30.0–36.0)
MCV: 87.9 fL (ref 80.0–100.0)
Monocytes Absolute: 0.8 10*3/uL (ref 0.1–1.0)
Monocytes Relative: 6 %
Neutro Abs: 10.1 10*3/uL — ABNORMAL HIGH (ref 1.7–7.7)
Neutrophils Relative %: 79 %
Platelets: 191 10*3/uL (ref 150–400)
RBC: 4.64 MIL/uL (ref 4.22–5.81)
RDW: 14.7 % (ref 11.5–15.5)
WBC: 12.8 10*3/uL — ABNORMAL HIGH (ref 4.0–10.5)
nRBC: 0 % (ref 0.0–0.2)

## 2019-04-20 LAB — MAGNESIUM: Magnesium: 2 mg/dL (ref 1.7–2.4)

## 2019-04-20 LAB — CBG MONITORING, ED: Glucose-Capillary: 183 mg/dL — ABNORMAL HIGH (ref 70–99)

## 2019-04-20 LAB — PHOSPHORUS: Phosphorus: 1.9 mg/dL — ABNORMAL LOW (ref 2.5–4.6)

## 2019-04-20 MED ORDER — LEVETIRACETAM IN NACL 1000 MG/100ML IV SOLN
1000.0000 mg | Freq: Once | INTRAVENOUS | Status: AC
  Administered 2019-04-20: 1000 mg via INTRAVENOUS
  Filled 2019-04-20: qty 100

## 2019-04-20 NOTE — ED Provider Notes (Signed)
[facility] EMERGENCY DEPARTMENT Provider Note   CSN: [id number] Arrival date & time: 04/20/19  2223     History Chief Complaint  Patient presents with  . Seizures    Juan Glenn is a 64 y.o. male with a history of seizure disorder on Keppra, cerebral amyloid angiopathy, PRES, provoked DVT and PE s/p IVC filter anticoagulation d/t intracranial bleed in 2015, tubular adenoma of colon, and prostate cancer s/p robot-assisted laparoscopic radical prostatectomy presents to the emergency department by EMS with a chief complaint of seizure.  The patient had a witnessed tonic-clonic seizure that lasted approximately 3 minutes.  EMS notes that the patient was postictal for approximately 1 hour.  He is now alert and oriented x4. He did not bite his tongue.  No incontinence.  He endorses a mild headache localized over the right forehead.  Reports that over the last few weeks that he has had 3-4 headaches, typically located over his left or right forehead.  Prior to that, he is not having headaches.  He reports that he had one episode of vomiting tonight.  He denies dizziness, lightheadedness, visual changes, numbness, weakness, fever, chills, diarrhea, abdominal pain, chest pain, shortness of breath.    He has followed by Dr. Posey Pronto with neurology.  Reports that he has not been seen in about a year, but has a follow-up appointment on April 8.  No recent missed doses of Keppra.  Patient was hospitalized in 2014 for a seizure that was most likely secondary to acute hypertensive encephalopathy.   The patient underwent robot-assisted laparoscopic radical prostatectomy on 12/28 reports significant weight loss since the procedure.  Reports that he has lost about 30 pounds since the procedure.  He reports that his blood pressure and blood sugars have been well controlled.  No recent medication changes.  No recent illnesses.  He has been sleeping well.  The history is provided by the  patient. No language interpreter was used.       Past Medical History:  Diagnosis Date  . Diabetes mellitus without complication ([place]) 123XX123  . Difficult intubation   . Hyperlipidemia   . PRES (posterior reversible encephalopathy syndrome) 09/2012  . Pulmonary embolism ([place])   . Renal disorder     Patient Active Problem List   Diagnosis Date Noted  . Diabetes mellitus with nephropathy ([place]) 09/06/2018  . Prostate cancer ([place]) 02/15/2018  . Tubular adenoma of colon 05/07/2016  . Cerebral amyloid angiopathy ([place]) 01/06/2014  . S/P insertion of IVC (inferior vena caval) filter 02/11/2013  . Intracranial hematoma ([place]) 02/04/2013  . Hematoma 02/04/2013  . Essential hypertension 01/02/2013  . Right leg DVT ([place]) 01/02/2013  . Seizure ([place]) 01/02/2013  . CKD (chronic kidney disease) stage 3, GFR 30-59 ml/min 12/24/2012  . Aspiration pneumonia ([place]) 12/24/2012  . Pulmonary embolism ([place]) 12/19/2012  . Acute respiratory failure ([place]) 12/05/2012  . ARDS (adult respiratory distress syndrome) ([place]) 12/05/2012  . Microproteinuria 11/16/2012  . HTN (hypertension), malignant 09/06/2012  . DM (diabetes mellitus), type 2 with renal complications ([place]) 99991111  . Convulsions/seizures ([place]) 09/06/2012  . Abnormal CT of brain 09/06/2012  . Syncope 09/06/2012  . Posterior reversible encephalopathy syndrome 08/30/2012    Past Surgical History:  Procedure Laterality Date  . no surgical history  09/2012       Family History  Problem Relation Age of Onset  . Diabetes Mother   . Congestive Heart Failure Mother        Died, 35s  .  Heart attack Father        Died, 90  . Diabetes Brother   . Hypertension Sister   . Cancer Brother        Died, 29    Social History   Tobacco Use  . Smoking status: Never Smoker  . Smokeless tobacco: Never Used  Substance Use Topics  . Alcohol use: No  . Drug use: No    Home Medications Prior to Admission medications   Medication Sig Start  Date End Date Taking? Authorizing Provider  glimepiride (AMARYL) 4 MG tablet Take 4 mg by mouth in the morning and at bedtime.  06/10/14   [provider]  glucose blood (ONE TOUCH ULTRA TEST) test strip TEST twice a day 11/12/13   [provider]  labetalol (NORMODYNE) 200 MG tablet Take 1 tablet (200 mg total) by mouth 2 (two) times daily. 12/18/12   Donita Brooks, NP  levETIRAcetam (KEPPRA) 500 MG tablet Take 1 tablet (500 mg total) by mouth 2 (two) times daily. 09/06/18   Patel, Donika K, DO  loratadine-pseudoephedrine (CLARITIN-D 24-HOUR) 10-240 MG 24 hr tablet Take 1 tablet by mouth daily as needed for allergies.    [provider]  metFORMIN (GLUCOPHAGE) 1000 MG tablet Take 1,000 mg by mouth 2 (two) times daily with a meal.  07/25/17   [provider]  Multiple Vitamin (MULTIVITAMIN WITH MINERALS) TABS tablet Take 1 tablet by mouth daily.    [provider]  ONE TOUCH LANCETS [place]  06/06/13   [provider]  ONE TOUCH ULTRA TEST test strip 2 (two) times daily. for testing 11/12/13   [provider]  pioglitazone (ACTOS) 30 MG tablet Take 30 mg by mouth daily.  08/13/17   [provider]  potassium & sodium phosphates (PHOS-NAK) 280-160-250 MG PACK Take 1 packet by mouth 4 (four) times daily -  with meals and at bedtime for 7 days. 04/21/19 04/28/19  Kashana Breach A, PA-C  rosuvastatin (CRESTOR) 10 MG tablet Take 10 mg by mouth daily.    [provider]  valsartan (DIOVAN) 160 MG tablet Take 160 mg by mouth daily. 06/19/18   [provider]  amLODipine (NORVASC) 10 MG tablet Take 1 tablet (10 mg total) by mouth daily. Patient not taking: Reported on 04/06/2019 01/01/13 04/21/19  Dellinger, Bobby Rumpf, PA-C    Allergies    Lisinopril and Other  Review of Systems   Review of Systems  Constitutional: Negative for appetite change, chills and fever.  Respiratory: Negative for shortness of breath.     Cardiovascular: Negative for chest pain.  Gastrointestinal: Negative for abdominal pain.  Genitourinary: Negative for dysuria.  Musculoskeletal: Negative for back pain.  Skin: Negative for rash.  Allergic/Immunologic: Negative for immunocompromised state.  Neurological: Positive for seizures. Negative for headaches.  Psychiatric/Behavioral: Negative for confusion.    Physical Exam Updated Vital Signs BP (!) 151/77   Pulse 92   Temp (!) 97.5 F (36.4 C) (Oral)   Resp 16   SpO2 98%   Physical Exam Vitals and nursing note reviewed.  Constitutional:      Appearance: He is well-developed.  HENT:     Head: Normocephalic.     Mouth/Throat:     Comments: No wounds noted to the tongue Eyes:     Conjunctiva/sclera: Conjunctivae normal.  Cardiovascular:     Rate and Rhythm: Normal rate and regular rhythm.     Pulses: Normal pulses.     Heart sounds: Normal  heart sounds. No murmur. No friction rub. No gallop.   Pulmonary:     Effort: Pulmonary effort is normal. No respiratory distress.     Breath sounds: No stridor. No wheezing, rhonchi or rales.  Chest:     Chest wall: No tenderness.  Abdominal:     General: There is no distension.     Palpations: Abdomen is soft. There is no mass.     Tenderness: There is no abdominal tenderness. There is no right CVA tenderness, left CVA tenderness, guarding or rebound.     Hernia: No hernia is present.  Musculoskeletal:     Cervical back: Neck supple.  Skin:    General: Skin is warm and dry.  Neurological:     Mental Status: He is alert.     Comments: Alert and oriented x4.  5-5 strength against resistance of the bilateral upper and lower extremities.  Sensation is intact throughout.  Cranial nerves II through XII are grossly intact.   Psychiatric:        Behavior: Behavior normal.     ED Results / Procedures / Treatments   Labs (all labs ordered are listed, but only abnormal results are displayed) Labs Reviewed  CBC WITH  DIFFERENTIAL/PLATELET - Abnormal; Notable for the following components:      Result Value   WBC 12.8 (*)    Neutro Abs 10.1 (*)    Abs Immature Granulocytes 0.08 (*)    All other components within normal limits  COMPREHENSIVE METABOLIC PANEL - Abnormal; Notable for the following components:   Potassium 3.4 (*)    CO2 18 (*)    Glucose, Bld 204 (*)    All other components within normal limits  PHOSPHORUS - Abnormal; Notable for the following components:   Phosphorus 1.9 (*)    All other components within normal limits  CBG MONITORING, ED - Abnormal; Notable for the following components:   Glucose-Capillary 183 (*)    All other components within normal limits  MAGNESIUM    EKG None  Radiology CT Head Wo Contrast  Result Date: 04/20/2019 CLINICAL DATA:  Seizure EXAM: CT HEAD WITHOUT CONTRAST TECHNIQUE: Contiguous axial images were obtained from the base of the skull through the vertex without intravenous contrast. COMPARISON:  12/18/2015 FINDINGS: Brain: No acute intracranial abnormality. Specifically, no hemorrhage, hydrocephalus, mass lesion, acute infarction, or significant intracranial injury. Vascular: No hyperdense vessel or unexpected calcification. Skull: No acute calvarial abnormality. Sinuses/Orbits: Visualized paranasal sinuses and mastoids clear. Orbital soft tissues unremarkable. Other: None IMPRESSION: No acute intracranial abnormality. Electronically Signed   By: Rolm Baptise M.D.   On: 04/20/2019 23:49    Procedures Procedures (including critical care time)  Medications Ordered in ED Medications  levETIRAcetam (KEPPRA) IVPB 1000 mg/100 mL premix (0 mg Intravenous Stopped 04/21/19 0004)    ED Course  I have reviewed the triage vital signs and the nursing notes.  Pertinent labs & imaging results that were available during my care of the patient were reviewed by me and considered in my medical decision making (see chart for details).    MDM Rules/Calculators/A&P                       64 year old male with a history of seizure disorder on Keppra, cerebral amyloid angiopathy, PRES, provoked DVT and PE s/p IVC filter anticoagulation d/t intracranial bleed in 2015, tubular adenoma of colon, and prostate cancer s/p robot-assisted laparoscopic radical prostatectomy presenting after a 3-minute tonic-clonic seizure earlier tonight.  The patient is having approximately 4 lifetime seizures, including a 2 in the last month.  The patient does report that he has had a significant weight loss since December when he had a radical prostatectomy.  He has a diabetic and notes that sometimes his CBG will be very low at night when he checks it.  He has a follow-up appointment later this week with primary care to have this rechecked.  CBG today is 204.  EMS reports it was 114 in route.  He does have mild hypophosphatemia at 1.9.  Given seizure-like activity, recommended oral replenishment over the next week and to have labs rechecked.  No other significant electrolyte derangements.  Loading dose of Keppra given in the ER.  Given recurrent seizure in a short period of time with his history of PRES and cancer, will obtain CT head to rule out structural source of recurrent seizures.  CT head is unremarkable.  Doubt infectious etiology as the patient is having no infectious symptoms.  The patient was discussed with Dr. Betsey Holiday, attending physician, who is in agreement with work-up and plan.  He recommends increasing the patient's Keppra to 750 mg twice daily.  Since the patient receives his medications at mail order every 90 days he has been advised to cut his of 500 mg tablets in half and take 1.5 tablets in the morning and 1.5 tablets at night.  This plan was communicated to the patient and his wife were agreeable with the plan.  All questions answered.  Patient was ambulated in the ER by staff without difficulty.  The patient has an upcoming neurology appointment on April 8, and I  encouraged the patient to call Dr. Serita Grit office to see if he could be seen sooner.  At this time, the patient is hemodynamically stable to no acute distress.  Safe for discharge home with outpatient follow-up as indicated.  Final Clinical Impression(s) / ED Diagnoses Final diagnoses:  Seizure ([place])  Hypophosphatemia    Rx / DC Orders ED Discharge Orders         Ordered    potassium & sodium phosphates (PHOS-NAK) 280-160-250 MG PACK  3 times daily with meals & bedtime     04/21/19 0119           Joline Maxcy A, PA-C 04/21/19 0749    Orpah Greek, MD 04/22/19 [phone]

## 2019-04-20 NOTE — ED Triage Notes (Signed)
Pt comes via [facility] from home for seizures, tonic clonic, lasting about 3 minutes, remained post ictal for appx an hour, now AxO x 4.

## 2019-04-21 MED ORDER — POTASSIUM & SODIUM PHOSPHATES 280-160-250 MG PO PACK: 1.0000 | PACK | Freq: Three times a day (TID) | ORAL | 0 refills | Status: AC

## 2019-04-21 NOTE — Discharge Instructions (Addendum)
Thank you for allowing me to care for you today in the Emergency Department.   You were seen today in the emergency department after having a seizure.  You had a head CT that was normal.  I have printed out a copy of your labs.  They were overall reassuring, but your phosphorus level was slightly low.    Since you have had 2 seizures over the last few weeks, please increase your Keppra to 750 mg 2 times daily.  Since you have the 500 mg tablets at home, start taking 1.5 tablets at night and 1.5 tablets in the morning.  Please continue this regimen until you are seen by your neurologist.  I would also recommend calling to see if they can get you in a little sooner than your follow-up appointment on April 8.  Since most of your prescriptions from mail order, I have printed off a copy of this prescription to replenish your phosphorus.  Take 1 packet by mouth 3 times daily as prescribed for the next week.  Please make sure to discuss this with your primary care provider at your follow-up appointment.  I would also make sure to address the low blood sugars that you have been having in the evening at your next primary care appointment.  Return to the emergency department if you have another seizure, if you have new numbness or weakness, uncontrollable vomiting, high fevers, changes in your vision, or other new, concerning symptoms.

## 2019-04-21 NOTE — ED Notes (Signed)
Pt ambulated in hall without difficulty.

## 2019-04-28 DIAGNOSIS — N5231 Erectile dysfunction following radical prostatectomy: Secondary | ICD-10-CM | POA: Insufficient documentation

## 2019-05-08 ENCOUNTER — Other Ambulatory Visit: Payer: Self-pay

## 2019-05-08 ENCOUNTER — Encounter: Payer: Self-pay | Admitting: Neurology

## 2019-05-08 ENCOUNTER — Telehealth (INDEPENDENT_AMBULATORY_CARE_PROVIDER_SITE_OTHER): Payer: Self-pay | Admitting: Neurology

## 2019-05-08 VITALS — Ht 69.0 in | Wt 213.0 lb

## 2019-05-08 DIAGNOSIS — I680 Cerebral amyloid angiopathy: Secondary | ICD-10-CM

## 2019-05-08 DIAGNOSIS — R569 Unspecified convulsions: Secondary | ICD-10-CM

## 2019-05-08 DIAGNOSIS — E854 Organ-limited amyloidosis: Secondary | ICD-10-CM

## 2019-05-08 MED ORDER — LEVETIRACETAM 750 MG PO TABS: 750.0000 mg | ORAL_TABLET | Freq: Two times a day (BID) | ORAL | 3 refills | Status: DC

## 2019-05-08 NOTE — Progress Notes (Signed)
   Virtual Visit via Video Note The purpose of this virtual visit is to provide medical care while limiting exposure to the novel coronavirus.    Consent was obtained for video visit:  Yes.   Answered questions that patient had about telehealth interaction:  Yes.   I discussed the limitations, risks, security and privacy concerns of performing an evaluation and management service by telemedicine. I also discussed with the patient that there may be a patient responsible charge related to this service. The patient expressed understanding and agreed to proceed.  Pt location: Home Physician Location: office Name of referring provider:  Radiontchenko, Alexei, * I connected with Marcelino Duster at patients initiation/request on 05/08/2019 at 11:10 AM EDT by video enabled telemedicine application and verified that I am speaking with the correct person using two identifiers. Pt MRN:  [id number] Pt DOB:  25-Sep-1955 Video Participants:  Marcelino Duster   History of Present Illness: This is a 64 y.o. male returning for follow-up of seizure disorder and cerebral amyloid angiopathy.  He had a breakthrough seizure on 3 10/2019 which occurred when he was at home in his bed and witnessed by his wife who reports generalized tonic-clonic movement lasting for 1 minute.  He had associated tongue biting, no incontinence.  There was no specific trigger.  He has been compliant with his medications.  Denies any recent stressor, illness, or alcohol use.  His Keppra was increased to 750 mg twice daily which he is tolerating.  He has not had any interval seizures.  CT head performed on 04/20/2019 did not show any acute abnormalities.  He has not had an MRI.   Observations/Objective:   Vitals:   05/08/19 0938  Weight: 213 lb (96.6 kg)  Height: 5\' 9"  (1.753 m)   Patient is awake, alert, and appears comfortable.  Oriented x 4.   Extraocular muscles are intact. No ptosis.  Face is symmetric.  Speech is not  dysarthric. Tongue is midline. Antigravity in all extremities.  No pronator drift. Gait appears normal.   Assessment and Plan:  1.  Seizure disorder with breakthrough seizure in March 2021, prior to this, his last seizure was in December 2014 and well controlled on Keppra 500mg  BID.  -Continue Keppra 750 mg twice daily  - [place] driving laws were discussed with the patient, and he knows to stop driving after an episode of loss of consciousness, until 6 months event-free.  2.  Probable cerebral amyloid angiopathy manifesting with multiple lobar hemorrhages and seizures (09/2012).  He underwent extensive work-up for ?PRES and vasculitis including cerebral angiogram and CSF testing x 2 which return normal.  MRI brain from 12/2015 shows extensive microhemorrhages throughout, worse in the right occipital love there there is encephalomalacia.  - MRI brain wo contrast to evaluate for new bleed as a potential cause for his breakthrough seizure   Follow Up Instructions:   I discussed the assessment and treatment plan with the patient. The patient was provided an opportunity to ask questions and all were answered. The patient agreed with the plan and demonstrated an understanding of the instructions.   The patient was advised to call back or seek an in-person evaluation if the symptoms worsen or if the condition fails to improve as anticipated.  Follow-up in 6 months   Alda Berthold, DO

## 2019-06-17 ENCOUNTER — Ambulatory Visit
Admission: RE | Admit: 2019-06-17 | Discharge: 2019-06-17 | Disposition: A | Discharge status: Home / Self Care | Payer: No Typology Code available for payment source | Source: Ambulatory Visit | Attending: Neurology | Admitting: Neurology

## 2019-06-17 ENCOUNTER — Other Ambulatory Visit: Payer: Self-pay

## 2019-06-17 DIAGNOSIS — R569 Unspecified convulsions: Secondary | ICD-10-CM

## 2019-06-17 DIAGNOSIS — E854 Organ-limited amyloidosis: Secondary | ICD-10-CM

## 2019-06-18 ENCOUNTER — Telehealth: Payer: Self-pay

## 2019-06-18 NOTE — Telephone Encounter (Signed)
Patient decided not to have Cartoid dopplers done at this time.

## 2019-06-18 NOTE — Telephone Encounter (Signed)
Noted. His MRI brain did not show any acute changes. 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 Dr. Serita Grit extensive workup reviewed, patient not a candidate for anticoagulation/antiplatelet medication. Carotid ultrasound recommended as part of stroke workup for potential left ICA cause of stroke, which could change management if abnormal. Patient declines at this time. Would go to ER for any sudden change in symptoms.

## 2019-09-05 ENCOUNTER — Ambulatory Visit: Payer: PRIVATE HEALTH INSURANCE | Admitting: Neurology

## 2019-09-09 ENCOUNTER — Emergency Department (HOSPITAL_COMMUNITY)
Admission: EM | Admit: 2019-09-09 | Discharge: 2019-09-10 | Disposition: A | Discharge status: Home / Self Care | Payer: No Typology Code available for payment source | Attending: Emergency Medicine | Admitting: Emergency Medicine

## 2019-09-09 ENCOUNTER — Encounter (HOSPITAL_COMMUNITY): Payer: Self-pay | Admitting: Emergency Medicine

## 2019-09-09 ENCOUNTER — Other Ambulatory Visit: Payer: Self-pay

## 2019-09-09 DIAGNOSIS — G40909 Epilepsy, unspecified, not intractable, without status epilepticus: Secondary | ICD-10-CM | POA: Insufficient documentation

## 2019-09-09 DIAGNOSIS — Z5321 Procedure and treatment not carried out due to patient leaving prior to being seen by health care provider: Secondary | ICD-10-CM | POA: Insufficient documentation

## 2019-09-09 LAB — CBC WITH DIFFERENTIAL/PLATELET
Abs Immature Granulocytes: 0.23 10*3/uL — ABNORMAL HIGH (ref 0.00–0.07)
Basophils Absolute: 0.1 10*3/uL (ref 0.0–0.1)
Basophils Relative: 1 %
Eosinophils Absolute: 0.1 10*3/uL (ref 0.0–0.5)
Eosinophils Relative: 1 %
HCT: 44.6 % (ref 39.0–52.0)
Hemoglobin: 14.5 g/dL (ref 13.0–17.0)
Immature Granulocytes: 2 %
Lymphocytes Relative: 11 %
Lymphs Abs: 1.4 10*3/uL (ref 0.7–4.0)
MCH: 29.2 pg (ref 26.0–34.0)
MCHC: 32.5 g/dL (ref 30.0–36.0)
MCV: 89.9 fL (ref 80.0–100.0)
Monocytes Absolute: 0.7 10*3/uL (ref 0.1–1.0)
Monocytes Relative: 6 %
Neutro Abs: 10.1 10*3/uL — ABNORMAL HIGH (ref 1.7–7.7)
Neutrophils Relative %: 79 %
Platelets: 184 10*3/uL (ref 150–400)
RBC: 4.96 MIL/uL (ref 4.22–5.81)
RDW: 14.8 % (ref 11.5–15.5)
WBC: 12.6 10*3/uL — ABNORMAL HIGH (ref 4.0–10.5)
nRBC: 0 % (ref 0.0–0.2)

## 2019-09-09 LAB — BASIC METABOLIC PANEL
Anion gap: 15 (ref 5–15)
BUN: 8 mg/dL (ref 8–23)
CO2: 18 mmol/L — ABNORMAL LOW (ref 22–32)
Calcium: 9.5 mg/dL (ref 8.9–10.3)
Chloride: 105 mmol/L (ref 98–111)
Creatinine, Ser: 1.3 mg/dL — ABNORMAL HIGH (ref 0.61–1.24)
GFR calc Af Amer: >60 mL/min (ref >60)
GFR calc non Af Amer: 58 mL/min — ABNORMAL LOW (ref >60)
Glucose, Bld: 224 mg/dL — ABNORMAL HIGH (ref 70–99)
Potassium: 5.3 mmol/L — ABNORMAL HIGH (ref 3.5–5.1)
Sodium: 138 mmol/L (ref 135–145)

## 2019-09-09 LAB — CBG MONITORING, ED: Glucose-Capillary: 117 mg/dL — ABNORMAL HIGH (ref 70–99)

## 2019-09-09 NOTE — ED Triage Notes (Signed)
Per EMS, Pt's wife reports a seizure lasting 5-7 minutes (which is why she called EMS.)  Pt has hx of stroke in 2014 which left him w/ occational seizures but no other deficits.  Pt continues to be postictal however is able to follow commands.    CBG 161 95% RA 18 RR 88 pulse 142/88  20G L AC

## 2019-09-10 NOTE — ED Notes (Signed)
Pt decided to leave AMA.

## 2019-11-10 ENCOUNTER — Ambulatory Visit: Payer: Self-pay | Admitting: Neurology

## 2019-12-22 ENCOUNTER — Encounter: Payer: Self-pay | Admitting: Neurology

## 2019-12-22 ENCOUNTER — Other Ambulatory Visit: Payer: Self-pay

## 2019-12-22 ENCOUNTER — Telehealth (INDEPENDENT_AMBULATORY_CARE_PROVIDER_SITE_OTHER): Payer: Medicare Other | Admitting: Neurology

## 2019-12-22 VITALS — Ht 69.0 in | Wt 220.0 lb

## 2019-12-22 DIAGNOSIS — E854 Organ-limited amyloidosis: Secondary | ICD-10-CM

## 2019-12-22 DIAGNOSIS — R569 Unspecified convulsions: Secondary | ICD-10-CM | POA: Diagnosis not present

## 2019-12-22 DIAGNOSIS — I680 Cerebral amyloid angiopathy: Secondary | ICD-10-CM

## 2019-12-22 MED ORDER — LEVETIRACETAM 750 MG PO TABS: 750.0000 mg | ORAL_TABLET | Freq: Two times a day (BID) | ORAL | 3 refills | Status: AC

## 2019-12-22 NOTE — Progress Notes (Signed)
   Virtual Visit via Video Note The purpose of this virtual visit is to provide medical care while limiting exposure to the novel coronavirus.    Consent was obtained for video visit:  Yes.   Answered questions that patient had about telehealth interaction:  Yes.   I discussed the limitations, risks, security and privacy concerns of performing an evaluation and management service by telemedicine. I also discussed with the patient that there may be a patient responsible charge related to this service. The patient expressed understanding and agreed to proceed.  Pt location: Home Physician Location: office Name of referring provider:  Radiontchenko, Alexei, * I connected with Juan Glenn at patients initiation/request on 12/22/2019 at 10:50 AM EST by video enabled telemedicine application and verified that I am speaking with the correct person using two identifiers. Pt MRN:  [id number] Pt DOB:  12/03/1955 Video Participants:  Juan Glenn   History of Present Illness: This is a 64 y.o. male returning for follow-up of seizure disorder and cerebral amyloid angiopathy.  He has been compliant with Keppra 750mg  BID and tolerating it well.  No interval seizures.  No new complaints and overall feels well.   Observations/Objective:   Vitals:   12/22/19 0959  Weight: 220 lb (99.8 kg)  Height: 5\' 9"  (1.753 m)   Patient is awake, alert, and appears comfortable.  Oriented x 4.   Extraocular muscles are intact. No ptosis.  Face is symmetric.  Speech is not dysarthric.  Antigravity in all extremities. Finger tapping intact bilaterally. Gait appears normal.   Assessment and Plan:  1.  Seizure disorder, last seizure in March 2021 (breakthrough, unclear etiology), prior to which he was very well-controlled on Keppra from December 2014.  He has been doing extremely well with no interval seizures  - Continue Keppra 750mg  twice daily - refills provided  - Cleared to resume driving, since  it has been > 6 months seizure free  2.  Cerebral amyloid angiopathy as seen by multiple lobar hemorrhages and seizures (09/2012).  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  He remains symptomatic.   - BP management as per primary team   Follow Up Instructions:   I discussed the assessment and treatment plan with the patient. The patient was provided an opportunity to ask questions and all were answered. The patient agreed with the plan and demonstrated an understanding of the instructions.   The patient was advised to call back or seek an in-person evaluation if the symptoms worsen or if the condition fails to improve as anticipated.  Follow-up in 1 year   Alda Berthold, DO

## 2020-05-13 ENCOUNTER — Telehealth: Payer: Self-pay | Admitting: Neurology

## 2020-05-13 NOTE — Telephone Encounter (Signed)
Patient's wife called and said the patient had a sinus infection last week. Since then he's has had a continued headache and is seeing floaters and images that are not there. She said over the counter medications are not helping the pain.  Please call.

## 2020-05-17 NOTE — Telephone Encounter (Signed)
I would need to evaluate him in the office. Please offer work-in appt this Thursday.

## 2020-05-17 NOTE — Telephone Encounter (Signed)
Called patient and left a message to call back for scheduling.

## 2020-05-19 NOTE — Telephone Encounter (Signed)
Called patient and left a message to call back for scheduling.

## 2020-05-24 NOTE — Telephone Encounter (Signed)
Routing to provider for review, patient is deceased now. Closing encounter.

## 2020-05-30 DEATH — deceased
